# Patient Record
Sex: Female | Born: 1940 | Race: White | Hispanic: No | Marital: Married | Smoking: Former smoker
Health system: Southern US, Community
[De-identification: ages and names within clinical notes are randomized; demographics above are authoritative.]

## PROBLEM LIST (undated history)

## (undated) DIAGNOSIS — K469 Unspecified abdominal hernia without obstruction or gangrene: Secondary | ICD-10-CM

## (undated) DIAGNOSIS — I700 Atherosclerosis of aorta: Secondary | ICD-10-CM

## (undated) DIAGNOSIS — Z961 Presence of intraocular lens: Secondary | ICD-10-CM

## (undated) DIAGNOSIS — Z973 Presence of spectacles and contact lenses: Secondary | ICD-10-CM

## (undated) DIAGNOSIS — K5190 Ulcerative colitis, unspecified, without complications: Secondary | ICD-10-CM

## (undated) DIAGNOSIS — M21619 Bunion of unspecified foot: Secondary | ICD-10-CM

## (undated) DIAGNOSIS — K222 Esophageal obstruction: Secondary | ICD-10-CM

## (undated) DIAGNOSIS — K5730 Diverticulosis of large intestine without perforation or abscess without bleeding: Secondary | ICD-10-CM

## (undated) DIAGNOSIS — Z8709 Personal history of other diseases of the respiratory system: Secondary | ICD-10-CM

## (undated) DIAGNOSIS — E785 Hyperlipidemia, unspecified: Secondary | ICD-10-CM

## (undated) DIAGNOSIS — R21 Rash and other nonspecific skin eruption: Secondary | ICD-10-CM

## (undated) DIAGNOSIS — K644 Residual hemorrhoidal skin tags: Secondary | ICD-10-CM

## (undated) DIAGNOSIS — Z87440 Personal history of urinary (tract) infections: Secondary | ICD-10-CM

## (undated) DIAGNOSIS — Z8619 Personal history of other infectious and parasitic diseases: Secondary | ICD-10-CM

## (undated) DIAGNOSIS — H179 Unspecified corneal scar and opacity: Secondary | ICD-10-CM

## (undated) DIAGNOSIS — R2689 Other abnormalities of gait and mobility: Secondary | ICD-10-CM

## (undated) DIAGNOSIS — Z9989 Dependence on other enabling machines and devices: Secondary | ICD-10-CM

## (undated) DIAGNOSIS — M8580 Other specified disorders of bone density and structure, unspecified site: Secondary | ICD-10-CM

## (undated) DIAGNOSIS — E039 Hypothyroidism, unspecified: Secondary | ICD-10-CM

## (undated) DIAGNOSIS — R6883 Chills (without fever): Secondary | ICD-10-CM

## (undated) DIAGNOSIS — F329 Major depressive disorder, single episode, unspecified: Secondary | ICD-10-CM

## (undated) DIAGNOSIS — J45909 Unspecified asthma, uncomplicated: Secondary | ICD-10-CM

## (undated) DIAGNOSIS — R42 Dizziness and giddiness: Secondary | ICD-10-CM

## (undated) DIAGNOSIS — S82899A Other fracture of unspecified lower leg, initial encounter for closed fracture: Secondary | ICD-10-CM

## (undated) DIAGNOSIS — H01006 Unspecified blepharitis left eye, unspecified eyelid: Secondary | ICD-10-CM

## (undated) DIAGNOSIS — L299 Pruritus, unspecified: Secondary | ICD-10-CM

## (undated) DIAGNOSIS — IMO0002 Reserved for concepts with insufficient information to code with codable children: Secondary | ICD-10-CM

## (undated) DIAGNOSIS — K859 Acute pancreatitis without necrosis or infection, unspecified: Secondary | ICD-10-CM

## (undated) DIAGNOSIS — J189 Pneumonia, unspecified organism: Secondary | ICD-10-CM

## (undated) DIAGNOSIS — K589 Irritable bowel syndrome without diarrhea: Secondary | ICD-10-CM

## (undated) DIAGNOSIS — F419 Anxiety disorder, unspecified: Secondary | ICD-10-CM

## (undated) DIAGNOSIS — Z780 Asymptomatic menopausal state: Secondary | ICD-10-CM

## (undated) DIAGNOSIS — K219 Gastro-esophageal reflux disease without esophagitis: Secondary | ICD-10-CM

## (undated) DIAGNOSIS — H04123 Dry eye syndrome of bilateral lacrimal glands: Secondary | ICD-10-CM

## (undated) DIAGNOSIS — K648 Other hemorrhoids: Secondary | ICD-10-CM

## (undated) DIAGNOSIS — D649 Anemia, unspecified: Secondary | ICD-10-CM

## (undated) DIAGNOSIS — H43819 Vitreous degeneration, unspecified eye: Secondary | ICD-10-CM

## (undated) DIAGNOSIS — I8390 Asymptomatic varicose veins of unspecified lower extremity: Secondary | ICD-10-CM

## (undated) DIAGNOSIS — H31009 Unspecified chorioretinal scars, unspecified eye: Secondary | ICD-10-CM

## (undated) DIAGNOSIS — K620 Anal polyp: Secondary | ICD-10-CM

## (undated) DIAGNOSIS — F32A Depression, unspecified: Secondary | ICD-10-CM

## (undated) DIAGNOSIS — M797 Fibromyalgia: Secondary | ICD-10-CM

## (undated) DIAGNOSIS — G43909 Migraine, unspecified, not intractable, without status migrainosus: Secondary | ICD-10-CM

## (undated) DIAGNOSIS — H269 Unspecified cataract: Secondary | ICD-10-CM

## (undated) DIAGNOSIS — I6381 Other cerebral infarction due to occlusion or stenosis of small artery: Secondary | ICD-10-CM

## (undated) DIAGNOSIS — I480 Paroxysmal atrial fibrillation: Secondary | ICD-10-CM

## (undated) DIAGNOSIS — M67919 Unspecified disorder of synovium and tendon, unspecified shoulder: Secondary | ICD-10-CM

## (undated) DIAGNOSIS — R112 Nausea with vomiting, unspecified: Secondary | ICD-10-CM

## (undated) DIAGNOSIS — K449 Diaphragmatic hernia without obstruction or gangrene: Secondary | ICD-10-CM

## (undated) DIAGNOSIS — B029 Zoster without complications: Secondary | ICD-10-CM

## (undated) DIAGNOSIS — G4733 Obstructive sleep apnea (adult) (pediatric): Secondary | ICD-10-CM

## (undated) DIAGNOSIS — N8110 Cystocele, unspecified: Secondary | ICD-10-CM

## (undated) DIAGNOSIS — Z800 Family history of malignant neoplasm of digestive organs: Secondary | ICD-10-CM

## (undated) DIAGNOSIS — Z9889 Other specified postprocedural states: Secondary | ICD-10-CM

## (undated) DIAGNOSIS — H26499 Other secondary cataract, unspecified eye: Secondary | ICD-10-CM

## (undated) DIAGNOSIS — I639 Cerebral infarction, unspecified: Secondary | ICD-10-CM

## (undated) DIAGNOSIS — T7840XA Allergy, unspecified, initial encounter: Secondary | ICD-10-CM

## (undated) HISTORY — DX: Diverticulosis of large intestine without perforation or abscess without bleeding: K57.30

## (undated) HISTORY — PX: TONSILLECTOMY: SUR1361

## (undated) HISTORY — DX: Bunion of unspecified foot: M21.619

## (undated) HISTORY — PX: FOOT SURGERY: SHX648

## (undated) HISTORY — DX: Presence of intraocular lens: Z96.1

## (undated) HISTORY — DX: Residual hemorrhoidal skin tags: K64.4

## (undated) HISTORY — DX: Zoster without complications: B02.9

## (undated) HISTORY — DX: Esophageal obstruction: K22.2

## (undated) HISTORY — DX: Migraine, unspecified, not intractable, without status migrainosus: G43.909

## (undated) HISTORY — PX: TOTAL HIP ARTHROPLASTY: SHX124

## (undated) HISTORY — DX: Fibromyalgia: M79.7

## (undated) HISTORY — DX: Major depressive disorder, single episode, unspecified: F32.9

## (undated) HISTORY — DX: Acute pancreatitis without necrosis or infection, unspecified: K85.90

## (undated) HISTORY — DX: Allergy, unspecified, initial encounter: T78.40XA

## (undated) HISTORY — DX: Other fracture of unspecified lower leg, initial encounter for closed fracture: S82.899A

## (undated) HISTORY — PX: TOTAL KNEE ARTHROPLASTY: SHX125

## (undated) HISTORY — DX: Unspecified disorder of synovium and tendon, unspecified shoulder: M67.919

## (undated) HISTORY — PX: COLONOSCOPY: SHX174

## (undated) HISTORY — DX: Unspecified corneal scar and opacity: H17.9

## (undated) HISTORY — DX: Other specified disorders of bone density and structure, unspecified site: M85.80

## (undated) HISTORY — DX: Other hemorrhoids: K64.8

## (undated) HISTORY — DX: Unspecified blepharitis left eye, unspecified eyelid: H01.006

## (undated) HISTORY — PX: ABDOMINAL HYSTERECTOMY: SHX81

## (undated) HISTORY — DX: Paroxysmal atrial fibrillation: I48.0

## (undated) HISTORY — PX: HEMORRHOID SURGERY: SHX153

## (undated) HISTORY — DX: Unspecified asthma, uncomplicated: J45.909

## (undated) HISTORY — DX: Anal polyp: K62.0

## (undated) HISTORY — DX: Hyperlipidemia, unspecified: E78.5

## (undated) HISTORY — PX: EYE SURGERY: SHX253

## (undated) HISTORY — DX: Dependence on other enabling machines and devices: Z99.89

## (undated) HISTORY — PX: BREAST EXCISIONAL BIOPSY: SUR124

## (undated) HISTORY — PX: ESOPHAGEAL DILATION: SHX303

## (undated) HISTORY — DX: Anxiety disorder, unspecified: F41.9

## (undated) HISTORY — DX: Obstructive sleep apnea (adult) (pediatric): G47.33

## (undated) HISTORY — DX: Reserved for concepts with insufficient information to code with codable children: IMO0002

## (undated) HISTORY — DX: Other cerebral infarction due to occlusion or stenosis of small artery: I63.81

## (undated) HISTORY — DX: Unspecified abdominal hernia without obstruction or gangrene: K46.9

## (undated) HISTORY — PX: ANAL FISSURE REPAIR: SHX2312

## (undated) HISTORY — DX: Dry eye syndrome of bilateral lacrimal glands: H04.123

## (undated) HISTORY — PX: NASAL SEPTUM SURGERY: SHX37

## (undated) HISTORY — DX: Cerebral infarction, unspecified: I63.9

## (undated) HISTORY — DX: Unspecified cataract: H26.9

## (undated) HISTORY — DX: Gastro-esophageal reflux disease without esophagitis: K21.9

## (undated) HISTORY — DX: Unspecified chorioretinal scars, unspecified eye: H31.009

## (undated) HISTORY — DX: Diaphragmatic hernia without obstruction or gangrene: K44.9

## (undated) HISTORY — DX: Anemia, unspecified: D64.9

## (undated) HISTORY — DX: Atherosclerosis of aorta: I70.0

## (undated) HISTORY — DX: Vitreous degeneration, unspecified eye: H43.819

## (undated) HISTORY — DX: Depression, unspecified: F32.A

## (undated) HISTORY — DX: Family history of malignant neoplasm of digestive organs: Z80.0

## (undated) HISTORY — DX: Irritable bowel syndrome, unspecified: K58.9

## (undated) HISTORY — DX: Cystocele, unspecified: N81.10

## (undated) HISTORY — DX: Other secondary cataract, unspecified eye: H26.499

## (undated) HISTORY — DX: Hypothyroidism, unspecified: E03.9

---
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
## 1989-05-31 HISTORY — PX: TOTAL ABDOMINAL HYSTERECTOMY W/ BILATERAL SALPINGOOPHORECTOMY: SHX83

## 1996-05-31 DIAGNOSIS — K620 Anal polyp: Secondary | ICD-10-CM

## 1996-05-31 HISTORY — DX: Anal polyp: K62.0

## 1998-05-31 DIAGNOSIS — K644 Residual hemorrhoidal skin tags: Secondary | ICD-10-CM

## 1998-05-31 HISTORY — DX: Residual hemorrhoidal skin tags: K64.4

## 1998-07-07 ENCOUNTER — Other Ambulatory Visit: Admission: RE | Admit: 1998-07-07 | Discharge: 1998-07-07 | Payer: Self-pay | Admitting: Obstetrics and Gynecology

## 1999-07-20 ENCOUNTER — Other Ambulatory Visit: Admission: RE | Admit: 1999-07-20 | Discharge: 1999-07-20 | Payer: Self-pay | Admitting: Obstetrics and Gynecology

## 1999-08-10 ENCOUNTER — Encounter: Admission: RE | Admit: 1999-08-10 | Discharge: 1999-08-10 | Payer: Self-pay | Admitting: Obstetrics and Gynecology

## 1999-08-10 ENCOUNTER — Encounter: Payer: Self-pay | Admitting: Obstetrics and Gynecology

## 2000-02-16 ENCOUNTER — Ambulatory Visit (HOSPITAL_COMMUNITY): Admission: RE | Admit: 2000-02-16 | Discharge: 2000-02-16 | Payer: Self-pay | Admitting: Endocrinology

## 2000-07-25 ENCOUNTER — Other Ambulatory Visit: Admission: RE | Admit: 2000-07-25 | Discharge: 2000-07-25 | Payer: Self-pay | Admitting: Obstetrics and Gynecology

## 2000-08-17 ENCOUNTER — Encounter: Admission: RE | Admit: 2000-08-17 | Discharge: 2000-08-17 | Payer: Self-pay | Admitting: General Surgery

## 2000-08-17 ENCOUNTER — Encounter: Payer: Self-pay | Admitting: General Surgery

## 2001-08-21 ENCOUNTER — Encounter: Payer: Self-pay | Admitting: Endocrinology

## 2001-08-21 ENCOUNTER — Encounter: Admission: RE | Admit: 2001-08-21 | Discharge: 2001-08-21 | Payer: Self-pay | Admitting: Endocrinology

## 2001-08-22 ENCOUNTER — Other Ambulatory Visit: Admission: RE | Admit: 2001-08-22 | Discharge: 2001-08-22 | Payer: Self-pay | Admitting: Obstetrics and Gynecology

## 2001-12-12 ENCOUNTER — Encounter: Admission: RE | Admit: 2001-12-12 | Discharge: 2002-03-12 | Payer: Self-pay | Admitting: Endocrinology

## 2002-08-27 ENCOUNTER — Encounter: Payer: Self-pay | Admitting: General Surgery

## 2002-08-27 ENCOUNTER — Encounter: Admission: RE | Admit: 2002-08-27 | Discharge: 2002-08-27 | Payer: Self-pay | Admitting: General Surgery

## 2002-11-05 ENCOUNTER — Other Ambulatory Visit: Admission: RE | Admit: 2002-11-05 | Discharge: 2002-11-05 | Payer: Self-pay | Admitting: Obstetrics and Gynecology

## 2003-07-29 ENCOUNTER — Encounter: Payer: Self-pay | Admitting: Gastroenterology

## 2003-08-12 ENCOUNTER — Ambulatory Visit (HOSPITAL_COMMUNITY): Admission: RE | Admit: 2003-08-12 | Discharge: 2003-08-12 | Payer: Self-pay | Admitting: Orthopedic Surgery

## 2003-08-12 ENCOUNTER — Ambulatory Visit (HOSPITAL_BASED_OUTPATIENT_CLINIC_OR_DEPARTMENT_OTHER): Admission: RE | Admit: 2003-08-12 | Discharge: 2003-08-12 | Payer: Self-pay | Admitting: Orthopedic Surgery

## 2003-09-03 ENCOUNTER — Encounter: Admission: RE | Admit: 2003-09-03 | Discharge: 2003-09-03 | Payer: Self-pay | Admitting: Endocrinology

## 2004-01-14 ENCOUNTER — Other Ambulatory Visit: Admission: RE | Admit: 2004-01-14 | Discharge: 2004-01-14 | Payer: Self-pay | Admitting: Obstetrics and Gynecology

## 2004-08-11 ENCOUNTER — Ambulatory Visit: Payer: Self-pay | Admitting: Gastroenterology

## 2004-09-10 ENCOUNTER — Ambulatory Visit: Payer: Self-pay | Admitting: Gastroenterology

## 2004-09-16 ENCOUNTER — Encounter: Admission: RE | Admit: 2004-09-16 | Discharge: 2004-09-16 | Payer: Self-pay | Admitting: Obstetrics and Gynecology

## 2005-01-21 ENCOUNTER — Other Ambulatory Visit: Admission: RE | Admit: 2005-01-21 | Discharge: 2005-01-21 | Payer: Self-pay | Admitting: Obstetrics and Gynecology

## 2005-09-21 ENCOUNTER — Encounter: Admission: RE | Admit: 2005-09-21 | Discharge: 2005-09-21 | Payer: Self-pay | Admitting: General Surgery

## 2005-09-22 ENCOUNTER — Encounter: Admission: RE | Admit: 2005-09-22 | Discharge: 2005-09-22 | Payer: Self-pay | Admitting: General Surgery

## 2005-10-21 ENCOUNTER — Ambulatory Visit: Payer: Self-pay | Admitting: Gastroenterology

## 2005-11-23 ENCOUNTER — Ambulatory Visit: Payer: Self-pay | Admitting: Gastroenterology

## 2006-03-28 ENCOUNTER — Other Ambulatory Visit: Admission: RE | Admit: 2006-03-28 | Discharge: 2006-03-28 | Payer: Self-pay | Admitting: Obstetrics and Gynecology

## 2006-10-26 ENCOUNTER — Encounter: Admission: RE | Admit: 2006-10-26 | Discharge: 2006-10-26 | Payer: Self-pay | Admitting: General Surgery

## 2006-10-31 ENCOUNTER — Encounter: Admission: RE | Admit: 2006-10-31 | Discharge: 2006-10-31 | Payer: Self-pay | Admitting: General Surgery

## 2006-12-11 ENCOUNTER — Emergency Department (HOSPITAL_COMMUNITY): Admission: EM | Admit: 2006-12-11 | Discharge: 2006-12-11 | Payer: Self-pay | Admitting: Emergency Medicine

## 2006-12-14 ENCOUNTER — Encounter: Admission: RE | Admit: 2006-12-14 | Discharge: 2006-12-14 | Payer: Self-pay | Admitting: Geriatric Medicine

## 2007-02-14 ENCOUNTER — Encounter: Admission: RE | Admit: 2007-02-14 | Discharge: 2007-02-14 | Payer: Self-pay | Admitting: Neurology

## 2007-05-16 ENCOUNTER — Other Ambulatory Visit: Admission: RE | Admit: 2007-05-16 | Discharge: 2007-05-16 | Payer: Self-pay | Admitting: Obstetrics and Gynecology

## 2007-11-07 ENCOUNTER — Encounter: Admission: RE | Admit: 2007-11-07 | Discharge: 2007-11-07 | Payer: Self-pay | Admitting: Endocrinology

## 2008-02-13 ENCOUNTER — Ambulatory Visit: Payer: Self-pay | Admitting: Obstetrics and Gynecology

## 2008-02-25 ENCOUNTER — Emergency Department (HOSPITAL_COMMUNITY): Admission: EM | Admit: 2008-02-25 | Discharge: 2008-02-25 | Payer: Self-pay | Admitting: Family Medicine

## 2008-03-11 ENCOUNTER — Encounter: Admission: RE | Admit: 2008-03-11 | Discharge: 2008-03-11 | Payer: Self-pay | Admitting: Geriatric Medicine

## 2008-04-30 ENCOUNTER — Ambulatory Visit: Payer: Self-pay | Admitting: Obstetrics and Gynecology

## 2008-05-31 DIAGNOSIS — K5730 Diverticulosis of large intestine without perforation or abscess without bleeding: Secondary | ICD-10-CM

## 2008-05-31 HISTORY — DX: Diverticulosis of large intestine without perforation or abscess without bleeding: K57.30

## 2008-06-21 ENCOUNTER — Ambulatory Visit: Payer: Self-pay | Admitting: Obstetrics and Gynecology

## 2008-07-10 DIAGNOSIS — IMO0001 Reserved for inherently not codable concepts without codable children: Secondary | ICD-10-CM | POA: Insufficient documentation

## 2008-07-10 DIAGNOSIS — K5730 Diverticulosis of large intestine without perforation or abscess without bleeding: Secondary | ICD-10-CM | POA: Insufficient documentation

## 2008-07-10 DIAGNOSIS — F411 Generalized anxiety disorder: Secondary | ICD-10-CM | POA: Insufficient documentation

## 2008-07-10 DIAGNOSIS — K222 Esophageal obstruction: Secondary | ICD-10-CM | POA: Insufficient documentation

## 2008-07-10 DIAGNOSIS — K449 Diaphragmatic hernia without obstruction or gangrene: Secondary | ICD-10-CM | POA: Insufficient documentation

## 2008-07-10 DIAGNOSIS — M1990 Unspecified osteoarthritis, unspecified site: Secondary | ICD-10-CM | POA: Insufficient documentation

## 2008-07-10 DIAGNOSIS — K589 Irritable bowel syndrome without diarrhea: Secondary | ICD-10-CM | POA: Insufficient documentation

## 2008-07-10 DIAGNOSIS — F3289 Other specified depressive episodes: Secondary | ICD-10-CM | POA: Insufficient documentation

## 2008-07-10 DIAGNOSIS — K219 Gastro-esophageal reflux disease without esophagitis: Secondary | ICD-10-CM | POA: Insufficient documentation

## 2008-07-10 DIAGNOSIS — F329 Major depressive disorder, single episode, unspecified: Secondary | ICD-10-CM | POA: Insufficient documentation

## 2008-07-10 DIAGNOSIS — K648 Other hemorrhoids: Secondary | ICD-10-CM | POA: Insufficient documentation

## 2008-07-10 DIAGNOSIS — E039 Hypothyroidism, unspecified: Secondary | ICD-10-CM | POA: Insufficient documentation

## 2008-07-12 ENCOUNTER — Ambulatory Visit: Payer: Self-pay | Admitting: Gastroenterology

## 2008-07-12 DIAGNOSIS — R079 Chest pain, unspecified: Secondary | ICD-10-CM | POA: Insufficient documentation

## 2008-07-12 DIAGNOSIS — K5900 Constipation, unspecified: Secondary | ICD-10-CM | POA: Insufficient documentation

## 2008-07-12 DIAGNOSIS — R1319 Other dysphagia: Secondary | ICD-10-CM | POA: Insufficient documentation

## 2008-07-12 LAB — CONVERTED CEMR LAB
ALT: 19 units/L (ref 0–35)
AST: 23 units/L (ref 0–37)
Albumin: 4 g/dL (ref 3.5–5.2)
Alkaline Phosphatase: 52 units/L (ref 39–117)
BUN: 11 mg/dL (ref 6–23)
Basophils Absolute: 0 10*3/uL (ref 0.0–0.1)
Basophils Relative: 0 % (ref 0.0–3.0)
Bilirubin, Direct: 0.1 mg/dL (ref 0.0–0.3)
CO2: 29 meq/L (ref 19–32)
Calcium: 9.5 mg/dL (ref 8.4–10.5)
Chloride: 105 meq/L (ref 96–112)
Creatinine, Ser: 0.7 mg/dL (ref 0.4–1.2)
Eosinophils Absolute: 0.2 10*3/uL (ref 0.0–0.7)
Eosinophils Relative: 3 % (ref 0.0–5.0)
Ferritin: 26.2 ng/mL (ref 10.0–291.0)
Folate: >20 ng/mL
GFR calc Af Amer: 107 mL/min
GFR calc non Af Amer: 89 mL/min
Glucose, Bld: 100 mg/dL — ABNORMAL HIGH (ref 70–99)
HCT: 41.2 % (ref 36.0–46.0)
Hemoglobin: 14.2 g/dL (ref 12.0–15.0)
Iron: 77 ug/dL (ref 42–145)
Lymphocytes Relative: 38.8 % (ref 12.0–46.0)
MCHC: 34.6 g/dL (ref 30.0–36.0)
MCV: 90.7 fL (ref 78.0–100.0)
Monocytes Absolute: 0.5 10*3/uL (ref 0.1–1.0)
Monocytes Relative: 9.1 % (ref 3.0–12.0)
Neutro Abs: 2.7 10*3/uL (ref 1.4–7.7)
Neutrophils Relative %: 49.1 % (ref 43.0–77.0)
Platelets: 201 10*3/uL (ref 150–400)
Potassium: 4.1 meq/L (ref 3.5–5.1)
RBC: 4.54 M/uL (ref 3.87–5.11)
RDW: 11.8 % (ref 11.5–14.6)
Saturation Ratios: 20 % (ref 20.0–50.0)
Sodium: 140 meq/L (ref 135–145)
T3 Uptake Ratio: 35.1 % (ref 22.5–37.0)
T3, Free: 3.1 pg/mL (ref 2.3–4.2)
TSH: 0.79 microintl units/mL (ref 0.35–5.50)
Total Bilirubin: 0.6 mg/dL (ref 0.3–1.2)
Total Protein: 7 g/dL (ref 6.0–8.3)
Transferrin: 274.8 mg/dL (ref >=212.0)
Vitamin B-12: 395 pg/mL (ref 211–911)
WBC: 5.5 10*3/uL (ref 4.5–10.5)

## 2008-07-15 ENCOUNTER — Telehealth: Payer: Self-pay | Admitting: Gastroenterology

## 2008-07-16 ENCOUNTER — Ambulatory Visit (HOSPITAL_COMMUNITY): Admission: RE | Admit: 2008-07-16 | Discharge: 2008-07-16 | Payer: Self-pay | Admitting: Gastroenterology

## 2008-07-16 DIAGNOSIS — I6350 Cerebral infarction due to unspecified occlusion or stenosis of unspecified cerebral artery: Secondary | ICD-10-CM | POA: Insufficient documentation

## 2008-07-17 ENCOUNTER — Telehealth: Payer: Self-pay | Admitting: Gastroenterology

## 2008-07-24 ENCOUNTER — Telehealth: Payer: Self-pay | Admitting: Gastroenterology

## 2008-08-12 ENCOUNTER — Ambulatory Visit: Payer: Self-pay | Admitting: Obstetrics and Gynecology

## 2008-08-19 ENCOUNTER — Telehealth: Payer: Self-pay | Admitting: Gastroenterology

## 2008-08-26 ENCOUNTER — Telehealth: Payer: Self-pay | Admitting: Gastroenterology

## 2008-09-12 ENCOUNTER — Telehealth: Payer: Self-pay | Admitting: Gastroenterology

## 2008-09-18 ENCOUNTER — Ambulatory Visit: Payer: Self-pay | Admitting: Gastroenterology

## 2008-09-18 ENCOUNTER — Encounter: Payer: Self-pay | Admitting: Gastroenterology

## 2008-09-22 ENCOUNTER — Encounter: Payer: Self-pay | Admitting: Gastroenterology

## 2008-09-23 ENCOUNTER — Telehealth: Payer: Self-pay | Admitting: Gastroenterology

## 2008-11-05 ENCOUNTER — Telehealth: Payer: Self-pay | Admitting: Gastroenterology

## 2008-11-14 ENCOUNTER — Encounter: Admission: RE | Admit: 2008-11-14 | Discharge: 2008-11-14 | Payer: Self-pay | Admitting: Geriatric Medicine

## 2008-12-18 ENCOUNTER — Ambulatory Visit: Payer: Self-pay | Admitting: Obstetrics and Gynecology

## 2009-01-06 ENCOUNTER — Telehealth: Payer: Self-pay | Admitting: Gastroenterology

## 2009-01-13 ENCOUNTER — Ambulatory Visit: Payer: Self-pay | Admitting: Obstetrics and Gynecology

## 2009-01-29 ENCOUNTER — Ambulatory Visit: Payer: Self-pay | Admitting: Obstetrics and Gynecology

## 2009-02-05 ENCOUNTER — Telehealth: Payer: Self-pay | Admitting: Gastroenterology

## 2009-02-10 ENCOUNTER — Encounter: Payer: Self-pay | Admitting: Gastroenterology

## 2009-02-24 ENCOUNTER — Ambulatory Visit: Payer: Self-pay | Admitting: Women's Health

## 2009-03-11 ENCOUNTER — Ambulatory Visit: Payer: Self-pay | Admitting: Obstetrics and Gynecology

## 2009-03-20 ENCOUNTER — Telehealth: Payer: Self-pay | Admitting: Gastroenterology

## 2009-04-15 ENCOUNTER — Ambulatory Visit: Payer: Self-pay | Admitting: Women's Health

## 2009-05-31 HISTORY — PX: REPLACEMENT TOTAL KNEE: SUR1224

## 2009-07-02 ENCOUNTER — Other Ambulatory Visit: Admission: RE | Admit: 2009-07-02 | Discharge: 2009-07-02 | Payer: Self-pay | Admitting: Obstetrics and Gynecology

## 2009-07-02 ENCOUNTER — Ambulatory Visit: Payer: Self-pay | Admitting: Obstetrics and Gynecology

## 2009-07-15 ENCOUNTER — Encounter: Admission: RE | Admit: 2009-07-15 | Discharge: 2009-07-15 | Payer: Self-pay | Admitting: Neurological Surgery

## 2009-08-07 ENCOUNTER — Ambulatory Visit: Payer: Self-pay | Admitting: Women's Health

## 2009-08-26 ENCOUNTER — Ambulatory Visit: Payer: Self-pay | Admitting: Obstetrics and Gynecology

## 2009-09-08 ENCOUNTER — Inpatient Hospital Stay (HOSPITAL_COMMUNITY): Admission: RE | Admit: 2009-09-08 | Discharge: 2009-09-10 | Payer: Self-pay | Admitting: Orthopedic Surgery

## 2009-09-15 ENCOUNTER — Telehealth: Payer: Self-pay | Admitting: Gastroenterology

## 2009-09-16 ENCOUNTER — Ambulatory Visit: Payer: Self-pay | Admitting: Internal Medicine

## 2009-09-16 DIAGNOSIS — Z860100 Personal history of colon polyps, unspecified: Secondary | ICD-10-CM | POA: Insufficient documentation

## 2009-09-16 DIAGNOSIS — R197 Diarrhea, unspecified: Secondary | ICD-10-CM | POA: Insufficient documentation

## 2009-09-17 ENCOUNTER — Encounter: Payer: Self-pay | Admitting: Nurse Practitioner

## 2009-09-17 ENCOUNTER — Telehealth: Payer: Self-pay | Admitting: Nurse Practitioner

## 2009-09-18 ENCOUNTER — Telehealth: Payer: Self-pay | Admitting: Gastroenterology

## 2009-09-23 ENCOUNTER — Ambulatory Visit: Payer: Self-pay | Admitting: Nurse Practitioner

## 2009-09-24 LAB — CONVERTED CEMR LAB
Basophils Absolute: 0.1 10*3/uL (ref 0.0–0.1)
Basophils Relative: 0.5 % (ref 0.0–3.0)
Eosinophils Absolute: 0.1 10*3/uL (ref 0.0–0.7)
Eosinophils Relative: 1 % (ref 0.0–5.0)
HCT: 31.8 % — ABNORMAL LOW (ref 36.0–46.0)
Hemoglobin: 10.9 g/dL — ABNORMAL LOW (ref 12.0–15.0)
Lymphocytes Relative: 21.4 % (ref 12.0–46.0)
Lymphs Abs: 2.2 10*3/uL (ref 0.7–4.0)
MCHC: 34.3 g/dL (ref 30.0–36.0)
MCV: 92.7 fL (ref 78.0–100.0)
Monocytes Absolute: 0.6 10*3/uL (ref 0.1–1.0)
Monocytes Relative: 6.3 % (ref 3.0–12.0)
Neutro Abs: 7.1 10*3/uL (ref 1.4–7.7)
Neutrophils Relative %: 70.8 % (ref 43.0–77.0)
Platelets: 477 10*3/uL — ABNORMAL HIGH (ref 150.0–400.0)
RBC: 3.43 M/uL — ABNORMAL LOW (ref 3.87–5.11)
RDW: 12.9 % (ref 11.5–14.6)
WBC: 10.1 10*3/uL (ref 4.5–10.5)

## 2009-09-25 ENCOUNTER — Telehealth: Payer: Self-pay | Admitting: Gastroenterology

## 2009-10-06 ENCOUNTER — Telehealth: Payer: Self-pay | Admitting: Gastroenterology

## 2009-10-08 ENCOUNTER — Ambulatory Visit: Payer: Self-pay | Admitting: Women's Health

## 2009-11-25 ENCOUNTER — Encounter: Admission: RE | Admit: 2009-11-25 | Discharge: 2009-11-25 | Payer: Self-pay | Admitting: Geriatric Medicine

## 2009-12-31 ENCOUNTER — Ambulatory Visit: Payer: Self-pay | Admitting: Women's Health

## 2010-03-05 ENCOUNTER — Encounter: Payer: Self-pay | Admitting: Gastroenterology

## 2010-05-01 ENCOUNTER — Ambulatory Visit: Payer: Self-pay | Admitting: Women's Health

## 2010-06-21 ENCOUNTER — Encounter: Payer: Self-pay | Admitting: General Surgery

## 2010-06-21 ENCOUNTER — Encounter: Payer: Self-pay | Admitting: Orthopedic Surgery

## 2010-06-30 NOTE — Progress Notes (Signed)
Summary: Align  Phone Note Call from Patient Call back at [facility] Phone [phone]   Caller: Patient Call For: Dr. Jarold Motto Reason for Call: Talk to Nurse Summary of Call: 1. would like to know if she should use something else besides Align 2. would like labwork sent to Dr. Merlene Laughter and Dr. Corrin Parker Initial call taken by: Vallarie Mare,  September 25, 2009 11:55 AM  Follow-up for Phone Call        Talked with pt.  She will cont align since she is on  another antiobiotic.  She will report any diarrhea that develops. labs sent to MD as requested. Follow-up by: Ashok Cordia RN,  September 25, 2009 12:35 PM

## 2010-06-30 NOTE — Procedures (Signed)
Summary: EGD   EGD  Procedure date:  09/18/2008  Findings:      Location: [facility]    ENDOSCOPY PROCEDURE REPORT  PATIENT:  Kimberly [name], Kimberly [name]  MR#:  [id number] BIRTHDATE:   [birth year], 67 yrs. old   GENDER:   female  ENDOSCOPIST:   Vania Rea. Jarold Motto, MD, [facility] Referred by:   PROCEDURE DATE:  09/18/2008 PROCEDURE:  EGD with biopsy, Elease Hashimoto Dilation of Esophagus ASA CLASS:   Class II INDICATIONS: GERD, dysphagia   MEDICATIONS:    Fentanyl 25 mcg IV, Versed 2 mg IV robinul 0.2mg  TOPICAL ANESTHETIC:   none  DESCRIPTION OF PROCEDURE:   After the risks benefits and alternatives of the procedure were thoroughly explained, informed consent was obtained.  The LB GIF-H180 [id number] endoscope was introduced through the mouth and advanced to the second portion of the duodenum, limited by retching and gagging.   The instrument was slowly withdrawn as the mucosa was fully examined. <<PROCEDUREIMAGES>>    <<OLD IMAGES>>  Multiple erosions were found in the distal esophagus.  A hiatal hernia was found in the fundus. small 2 cm HH noted.  A stricture was found in the distal esophagus. Dilation with maloney dilator 17mm  The duodenal bulb was normal in appearance, as was the postbulbar duodenum. Multiple biopsies were obtained and sent to pathology.    Retroflexed views revealed a hiatal hernia.    The scope was then withdrawn from the patient and the procedure completed.  COMPLICATIONS:   None  ENDOSCOPIC IMPRESSION:  1) Erosions, multiple in the distal esophagus  2) Hiatal hernia in the fundus  3) Stricture in the distal esophagus  4) Normal duodenum  5) A hiatal hernia  CHRONIC GERD AND STRICTURE DILATED. RECOMMENDATIONS:  1) await biopsy results  2) continue current medications  REPEAT EXAM:   No   _______________________________ Vania Rea. Jarold Motto, MD, [facility]    CC: Merlene Laughter, MD        REPORT OF SURGICAL PATHOLOGY   Case #: [id number] Patient  Name: TAMELLA, TUCCILLO. Office Chart Number:  [id number]   MRN: [id number] Pathologist: Beulah Gandy. Luisa Hart, MD DOB/Age  06/06/1940 (Age: 12)    Gender: F Date Taken:  09/18/2008 Date Received: 09/18/2008   FINAL DIAGNOSIS   ***MICROSCOPIC EXAMINATION AND DIAGNOSIS***   SMALL BOWEL BIOPSY:  BENIGN SMALL BOWEL MUCOSA.  NO VILLOUS ATROPHY, INFLAMMATION OR OTHER ABNORMALITIES PRESENT.   COMMENT There is small bowel mucosa with normal villous architecture and no objective increase in inflammation.  No villous atrophy, active inflammation or other significant changes identified. (JDP:jy) 09/19/08   jy Date Reported:  09/19/2008     Beulah Gandy. Luisa Hart, MD *** Electronically Signed Out By JDP ***       September 22, 2008 MRN: [id number]    FIDELIS LOTH [address] [place], [place]  [address]    Dear Ms. Dimock,  I am pleased to inform you that the biopsies taken during your recent endoscopic examination did not show any evidence of cancer upon pathologic examination.Biopsies showed no evidence of celiac disease.  Additional information/recommendations:  __No further action is needed at this time.  Please follow-up with      your primary care physician for your other healthcare needs.  __ Please call [phone] to schedule a return visit to review      your condition.  xx__ Continue with the treatment plan as outlined on the day of your      exam.  __ You should  have a repeat endoscopic examination for this problem              in _ months/years.   Please call us if you are having persistent problems or have questions about your condition that have not been fully answered at this time.  Sincerely,  Mardella Layman MD [facility]  This letter has been electronically signed by your physician.   This report was created from the original endoscopy report, which was reviewed and signed by the above listed endoscopist.

## 2010-06-30 NOTE — Progress Notes (Signed)
Summary: Lab results  Phone Note Call from Patient Call back at Home Phone [phone]   Caller: Patient Call For: Dr. Jarold Motto Reason for Call: Lab or Test Results Summary of Call: pt is calling about lab results Initial call taken by: Karna Christmas,  September 18, 2009 9:38 AM  Follow-up for Phone Call        patient aware that c-diff result are neg. She is still worried about her blood count Per previous phone note patient can come for CBC to check her HGB/HCT she states that she can come for this test on Monday. I will make sure that the lab order is in [facility] and she will go to the basement.  Follow-up by: Harlow Mares CMA Duncan Dull),  September 18, 2009 10:41 AM    New/Updated Medications: * LOVENOX one injection two times a day

## 2010-06-30 NOTE — Progress Notes (Signed)
Summary: ? meds  Phone Note Call from Patient Call back at Home Phone [phone] Call back at [phone]   Caller: Patient Call For: Kimberly [name]  Reason for Call: Talk to Nurse Details for Reason: ? meds  Summary of Call: has ? re: meds  Initial call taken by: Guadlupe Spanish [facility],  July 15, 2008 11:51 AM  Follow-up for Phone Call        to nurse???? Follow-up by: Mardella Layman MD [facility],  July 15, 2008 12:11 PM  Additional Follow-up for Phone Call Additional follow up Details #1::        had alot of questions about her meds and wanted Dr. Jarold Motto know about her stroke she had. also wanted to know if she can take Librax as needed becuase she already has dry mouth. advised ok.  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

## 2010-06-30 NOTE — Progress Notes (Signed)
Summary: Ecl results  Phone Note Call from Patient Call back at Home Phone [phone] Call back at [phone]   Caller: Patient Call For: Makail Watling Reason for Call: Talk to Nurse Summary of Call: Patient wants ECL results form last week, I advised her that it was mailed to her today but she still wants to speak to nurse about it Initial call taken by: Tawni Levy,  September 23, 2008 4:48 PM  Follow-up for Phone Call        Left message for patient to call back Darcey Nora RN  September 24, 2008 9:02 AM  patient notifed of normal small bowel bx, all questions answered Follow-up by: Darcey Nora RN,  September 24, 2008 9:03 AM

## 2010-06-30 NOTE — Assessment & Plan Note (Signed)
Summary: Diarrhea, dfs   History of Present Illness Visit Type: Follow-up Visit Primary GI MD: Sheryn Bison MD FACP FAGA Primary Provider: Merlene Laughter, MD Chief Complaint: diarrhea History of Present Illness:   Followed by Dr. Jarold Motto for IBS, colon polyps and GERD. Has chronic constipation, uses Miralax or stool softeners as needed. Knee replacement on 09/08/09. Day after surgery received concoction of Miralax, senekot, colace and MOM. She has had bowel problems since then. Describes postprandial belching, gas, and loose stool associated with mild cramping. Also having some nocturnal diarrhea. Over last two days the diarrhea has slowed. She had one small, loose stool this am. No rectal bleeding. No fevers. Has lost several pounds recently.    GI Review of Systems    Reports abdominal pain and  belching.     Location of  Abdominal pain: mild lower cramps. Weight loss of 5-10 poounds pounds over last several days.   Denies acid reflux, bloating, chest pain, dysphagia with liquids, dysphagia with solids, heartburn, loss of appetite, nausea, vomiting, vomiting blood, weight loss, and  weight gain.      Reports diarrhea.     Denies anal fissure, black tarry stools, change in bowel habit, constipation, diverticulosis, fecal incontinence, heme positive stool, hemorrhoids, irritable bowel syndrome, jaundice, light color stool, liver problems, rectal bleeding, and  rectal pain.    Current Medications (verified): 1)  Fluticasone Propionate 50 Mcg/act Susp (Fluticasone Propionate) .... Take 2 Sprays in Each Nostril 2)  Claritin 10 Mg Tabs (Loratadine) .... Take 1 Tablet By Mouth As Needed 3)  Singulair 10 Mg Tabs (Montelukast Sodium) .... Take 1 Tablet By Mouth Once A Day As Needed 4)  Proair Hfa 108 (90 Base) Mcg/act Aers (Albuterol Sulfate) .... Take As Needed 5)  Alvesco 160 Mcg/act Aers (Ciclesonide) .... Take 2 Puffs Every 12 Hours 6)  Omeprazole 20 Mg Tbec (Omeprazole) .... Take One By  Mouth Once Daily 7)  Simply Saline 0.9 % Aers (Saline) .... Use Every Am and Pm 8)  Caltrate 600+d 600-400 Mg-Unit Tabs (Calcium Carbonate-Vitamin D) .... Take 2-3 Tablets By Mouth Once Daily 9)  Chewable Vitamin C 500 Mg Chew (Ascorbic Acid) .... Take 2 Tablets By Mouth Once Daily 10)  Vitamin E Complete  Caps (Vitamin Mixture) .... Take 1 Tablet By Mouth Once A Day 11)  Umecta 40 % Susp (Urea) .... Apply As Needed 12)  Synthroid 125 Mcg Tabs (Levothyroxine Sodium) .... Take 1 Tablet By Mouth Once A Day 13)  Aspirin 81 Mg Tbec (Aspirin) .... Take 1 Tablet By Mouth Once A Day 14)  Clidinium-Chlordiazepoxide 2.5-5 Mg Caps (Clidinium-Chlordiazepoxide) .... Take One By Mouth Three Times A Day 15)  Tylenol Extra Strength 500 Mg Tabs (Acetaminophen) .... As Needed  Allergies (verified): 1)  ! 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 Hysterectomy lumpectomy-left breast Right knee arthroscopy Left knee arthroscopy Left eye surgery Tonsillectomy Deviated septum repair  Family History: Family History of Kidney Disease: Sister ? Family History of Irritable Bowel Syndrome: Sister x 2, Mother Family History of Heart Disease: Father Family History of Diabetes: Sister  Social History: Reviewed history from 07/12/2008 and no changes required. Occupation: Retired/part time antique shop  Married  Patient is a former smoker. -stopped age 30 Alcohol Use - no Daily Caffeine Use-2 cups daily Illicit Drug Use  - no Patient does not get regular exercise.   Review of Systems  The patient denies allergy/sinus, anemia, anxiety-new, arthritis/joint pain, back pain, blood in urine, breast changes/lumps, change in vision, confusion, cough, coughing up blood, depression-new, fainting, fatigue, fever, headaches-new, hearing problems, heart murmur, heart rhythm changes, itching, menstrual pain, muscle pains/cramps, night sweats, nosebleeds, pregnancy symptoms, shortness of breath, skin rash, sleeping problems, sore throat, swelling of feet/legs, swollen lymph glands, thirst - excessive , urination - excessive , urination changes/pain, urine leakage, vision changes, and voice change.    Vital Signs:  Patient profile:   70 year old female Height:      67.5 inches Weight:      190 pounds BMI:     29.42 Pulse rate:   72 / minute Pulse rhythm:   regular BP sitting:   110 / 70  (right arm)  Vitals Entered By: Chales Abrahams CMA Duncan Dull) (September 16, 2009 9:56 AM)  Physical Exam  General:  Well developed, well nourished, no acute distress. Mouth:  No oral lesions. Tongue moist.  Lungs:  Clear throughout to auscultation. Heart:  Regular rate and rhythm. Abdomen:  Abdomen soft, nontender, nondistended. No obvious masses or hepatomegaly.Normal bowel sounds.  Msk:  Abdomen soft, nontender, nondistended. No obvious masses or hepatomegaly.Normal bowel sounds.  Extremities:  No palmar erythema, no edema.  Neurologic:  Alert and  oriented x4;  grossly normal neurologically. Cervical Nodes:  No significant cervical adenopathy. Psych:  Alert and cooperative. Normal mood and affect.   Impression & Recommendations:  Problem # 1:  DIARRHEA (ICD-787.91) Assessment New Normally constipated. Recent knee replacement requiring antibiotics. Diarrhea slightly better over last two days. Still need to exclude C-Difficile colitis. She looks fine, abdominal exam benign. Will await stool studies and she if she continues to  improve. Trial of Align.  Orders: T-Culture, C-Diff Toxin A/B ([id number])  Problem # 2:  PERSONAL HX COLONIC POLYPS (ICD-V12.72) Assessment: Comment Only Up to date on surveillance exam - done April 2010.  Patient Instructions: 1)  Take Align probiotic, 1 capsule daily for 14 days. Samples given. 2)  Please go to our lab.  3)  We have given you a Low Fiber Low Residue diet brochure. 4)  The medication list was reviewed and reconciled.  All changed / newly prescribed medications were explained.  A complete medication list was provided to the patient / caregiver.    Appended Document: Diarrhea, dfs Patient is on Lovenox. Will make sure that is added to current list of medications.   Appended Document: Diarrhea, dfs added to med list patient does not know dose she will call back with it.

## 2010-06-30 NOTE — Progress Notes (Signed)
Summary: TRIAGE  Phone Note Call from Patient Call back at Home Phone [phone]   Caller: Patient Call For: Sheryn Bison Reason for Call: Talk to Nurse Details for Reason: Triage Summary of Call: RE IBS Questions. Initial call taken by: Darryl Lent CMA Duncan Dull),  March 20, 2009 9:55 AM  Follow-up for Phone Call        Pt. has ongoing vaginal yeast problems for several monthes and some UTI's, Dr.Gottsegan is treating these issues. Pt. also has GERD and IBS, she takes Omeprazole for GERD.  Pt. is concerned the yeast may have gotten into her rectum/Colon. She declines an OV with Dr.Tannar Broker, at this time. I advised pt. to f/u with Dr.Gottsegan and if she needs a GI appt. to callback and we will get her seen.  Follow-up by: Laureen Ochs LPN,  March 20, 2009 11:23 AM

## 2010-06-30 NOTE — Progress Notes (Signed)
Summary: ? re procedure  Phone Note Call from Patient Call back at Home Phone [phone]   Caller: Patient Call For: Jarold Motto Reason for Call: Talk to Nurse Summary of Call: patient has questosn regarding ecl next week Initial call taken by: Tawni Levy,  September 12, 2008 10:31 AM  Follow-up for Phone Call        pateint would like to go [facility] if anything should happen after the procedure or during becuase she has cardiac issues.  Follow-up by: Harlow Mares CMA,  September 12, 2008 10:41 AM

## 2010-06-30 NOTE — Progress Notes (Signed)
Summary: Samples of Align  Phone Note Call from Patient Call back at Home Phone [phone] Call back at or cell [phone]   Call For: Dr Jarold Motto Reason for Call: Talk to Nurse Summary of Call: Needs samples of Align-would like to try it before buying it. Also wonders what she can take for Genella Rife is all out of Aciphex. Initial call taken by: Leanor Kail [facility],  August 26, 2008 9:19 AM  Follow-up for Phone Call        would like a reflux diet and samples of align, both out front for pick up. using Prilosec OTC abd still burping, would like samples of aciphex, out front for her to pick up. but RX sent also.  Follow-up by: Harlow Mares CMA,  August 26, 2008 9:31 AM    New/Updated Medications: ACIPHEX 20 MG TBEC (RABEPRAZOLE SODIUM) Take 1 tablet by mouth once a day ALIGN   CAPS (MISC INTESTINAL FLORA REGULAT) Take one capsule by mouth daily   Prescriptions: ACIPHEX 20 MG TBEC (RABEPRAZOLE SODIUM) Take 1 tablet by mouth once a day  #30 x 3   Entered by:   Harlow Mares CMA   Authorized by:   Mardella Layman MD FACG,FAGA   Signed by:   Harlow Mares CMA on 08/26/2008   Method used:   Electronically to        [facility]  [address] [phone]* (retail)       [address] [place], [place]  [address]       Ph: [phone] or [phone]       Fax: [phone]   RxID:   [id number]

## 2010-06-30 NOTE — Progress Notes (Signed)
Summary: labwork  Phone Note Call from Patient Call back at [phone]   Caller: Patient Call For: Dr. Jarold Motto Reason for Call: Talk to Nurse Summary of Call: would like to discuss labwork results in relation to pt still having symptoms; chills Initial call taken by: Vallarie Mare,  Oct 06, 2009 10:48 AM  Follow-up for Phone Call        LM for pt to call.  Lupita Leash Surface RN  Oct 06, 2009 10:58 AM  Pt would like to get CBC checked again.  Was slightly anemic when last checked and would like to make sure it is coming up.   Also c/o chills and wanted to check WBC.  It would be more convenient for pt to get labs drawn here.  Still taking physical therapy for knee replacement.   OK to order CBC for pt? Follow-up by: Ashok Cordia RN,  Oct 06, 2009 11:45 AM  Additional Follow-up for Phone Call Additional follow up Details #1::        F/U WITH pAULA... Additional Follow-up by: Mardella Layman MD [facility],  Oct 06, 2009 11:54 AM    Additional Follow-up for Phone Call Additional follow up Details #2::    Ok to oder CBC for pt?  see earlier messgae. Follow-up by: Ashok Cordia RN,  Oct 06, 2009 12:11 PM  Additional Follow-up for Phone Call Additional follow up Details #3:: Details for Additional Follow-up Action Taken: Dr. Lavella Hammock   i care to followup...  Pt notified.   Ashok Cordia RN  Oct 07, 2009 2:40 PM Additional Follow-up by: Mardella Layman MD [facility],  Oct 07, 2009 9:55 AM

## 2010-06-30 NOTE — Progress Notes (Signed)
Summary: omeprazole needs prior auth  Phone Note Call from Patient Call back at cell [phone]   Call For: Dr Jarold Motto Reason for Call: Talk to Nurse Summary of Call: Omeprazole requires prior auth now. Was told it would take 2wks or so-what can she take in the meantime? Initial call taken by: Leanor Kail [facility],  February 05, 2009 12:11 PM  Follow-up for Phone Call        I will work on prior auth today an tomorrow and let patient tomorrow AM. patient then has alot of questions I will have Lupita Leash call her back for.  Left message for pt to call  Ashok Cordia RN  February 07, 2009 4:18 PM Follow-up by: Harlow Mares CMA Duncan Dull),  February 05, 2009 12:27 PM  Additional Follow-up for Phone Call Additional follow up Details #1::        Pt asks if OK to take omeperazole.  States after had EGD Dr. Jarold Motto told her to stay on Aciphex.  States Aciphex made her feel funny.  Had alot of gas.  Pt felt like omeperazole has been the most helpful and had the least side effects.  Would like to stay on this if OK with Dr. Jarold Motto.  Hulan Saas is working on prior [place]) Additional Follow-up by: Ashok Cordia RN,  February 10, 2009 2:58 PM    Additional Follow-up for Phone Call Additional follow up Details #2::    patient aware prior auth approved , pharm aware  Left message for pt that OK with Dr. Jarold Motto to stay on omeperazole. Ashok Cordia RN  February 17, 2009 8:47 AM Follow-up by: Harlow Mares CMA Duncan Dull),  February 10, 2009 4:46 PM  Additional Follow-up for Phone Call Additional follow up Details #3:: Details for Additional Follow-up Action Taken: prilosec ok Additional Follow-up by: Mardella Layman MD [facility],  February 17, 2009 8:25 AM

## 2010-06-30 NOTE — Progress Notes (Signed)
Summary: triage  Phone Note From Other Clinic Call back at [phone]   Caller: Kirstin, [place] Call For: Dr. Jarold Motto Reason for Call: Schedule Patient Appt Summary of Call: would like pt seen asap for severe diarrhea Initial call taken by: Vallarie Mare,  September 15, 2009 4:19 PM  Follow-up for Phone Call        Pt had total knee replacement one week ago.  Was given antiobiotics.  Now having severe dairrhea.  Pt on lovonox inj.   Appt sch to see Rozetta Nunnery on 09/16/09. Follow-up by: Ashok Cordia RN,  September 15, 2009 4:37 PM

## 2010-06-30 NOTE — Letter (Signed)
Summary: Patient [facility] Biopsy Results  [facility]  [address] [place], [place] [address]   Phone: [phone]  Fax: [phone]        September 22, 2008 MRN: [id number]    MARGARET COCKERILL [address] [place], [place]  [address]    Dear Ms. Schlotterbeck,  I am pleased to inform you that the biopsies taken during your recent endoscopic examination did not show any evidence of cancer upon pathologic examination.Biopsies showed no evidence of celiac disease.  Additional information/recommendations:  __No further action is needed at this time.  Please follow-up with      your primary care physician for your other healthcare needs.  __ Please call [phone] to schedule a return visit to review      your condition.  xx__ Continue with the treatment plan as outlined on the day of your      exam.  __ You should have a repeat endoscopic examination for this problem              in _ months/years.   Please call us if you are having persistent problems or have questions about your condition that have not been fully answered at this time.  Sincerely,  Mardella Layman MD [facility]  This letter has been electronically signed by your physician.

## 2010-06-30 NOTE — Medication Information (Signed)
Summary: Prior autho & approved for Omeprazole/[employer]  Prior autho & approved for Omeprazole/[employer]   Imported By: Sherian Rein 03/11/2010 11:02:10  _____________________________________________________________________  External Attachment:    Type:   Image     Comment:   External Document

## 2010-06-30 NOTE — Assessment & Plan Note (Signed)
Summary: STOMACH ISSUES/DISCUSS RECALL COLON/YF   History of Present Illness Visit Type: follow up Primary GI MD: Sheryn Bison MD FACP FAGA Primary Provider: Merlene Laughter, MD Chief Complaint: Patient here to discuss problems with reflux, excessive belching as well as some dysphagia to solid foods at times.  Patient denies any odynophagia.  Patient notes that she is very stressed. Patient also notes that she stays constipated. History of Present Illness:   This 70 year old white female has a long history of GERD and irritable bowel syndrome with diverticulosis coli and recurrent colon polyps. She presents today with solid food dysphagia for several weeks with a history of previous endoscopy and esophageal dilatations several years ago. She's had recurrent chest pain since September and apparent had a negative cardiopulmonary evaluation by Dr. Pete Glatter and was replaced on AcipHex 20 mg a day. She's also had a flare of IBS with abdominal cramping, gas, bloating, and mostly constipation. She does use p.r.n. MiraLax and daily Colace. She is taking other medications listed and reviewed her chart including various asthma meds, Synthroid, and aspirin.  Because of her chronic abdominal gas and bloating I did treat her for bacterial overgrowth syndrome in May of 2007 with good response. Was noted at that time that she also has fibromyalgia and is followed by rheumatology. She additionally is on calcium and vitamin D for osteoporosis. She follows a regular diet and denies any specific food intolerances, anorexia or weight loss. Her upper abdominal pain is better but she continues to have subxiphoid discomfort. In reviewing her chart I cannot see previous ultrasound exam of her gallbladder.   GI Review of Systems    Reports acid reflux, belching, bloating, chest pain, dysphagia with solids, and  heartburn.      Denies loss of appetite, nausea, vomiting, and  weight loss.      Reports constipation,  hemorrhoids, and  irritable bowel syndrome.     Denies anal fissure, black tarry stools, change in bowel habit, diarrhea, diverticulosis, fecal incontinence, heme positive stool, jaundice, light color stool, liver problems, rectal bleeding, and  rectal pain.   Prior Medications Reviewed Using: Patient Recall  Updated Prior Medication List: FLUTICASONE PROPIONATE 50 MCG/ACT SUSP (FLUTICASONE PROPIONATE) Take 2 sprays in each nostril CLARITIN 10 MG TABS (LORATADINE) Take 1 tablet by mouth as needed SINGULAIR 10 MG TABS (MONTELUKAST SODIUM) Take 1 tablet by mouth once a day as needed PROAIR HFA 108 (90 BASE) MCG/ACT AERS (ALBUTEROL SULFATE) Take as needed ALVESCO 160 MCG/ACT AERS (CICLESONIDE) Take 2 puffs every 12 hours ACIPHEX 20 MG TBEC (RABEPRAZOLE SODIUM) Take 1 tablet by mouth once a day SIMPLY SALINE 0.9 % AERS (SALINE) Use every am and pm CALTRATE 600+D 600-400 MG-UNIT TABS (CALCIUM CARBONATE-VITAMIN D) Take 2-3 tablets by mouth once daily CHEWABLE VITAMIN C 500 MG CHEW (ASCORBIC ACID) Take 2 tablets by mouth once daily VITAMIN E COMPLETE  CAPS (VITAMIN MIXTURE) Take 1 tablet by mouth once a day UMECTA 40 % SUSP (UREA) Apply as needed MIRALAX  POWD (POLYETHYLENE GLYCOL 3350) Take 1 scoop once daily as needed COLACE 50 MG CAPS (DOCUSATE SODIUM) Take 1 tablet by mouth once a day ESTRADIOL 0.5 MG TABS (ESTRADIOL) Take 1/2 tablet by mouth once daily VAGIFEM 25 MCG TABS (ESTRADIOL) 2 times per week SYNTHROID 125 MCG TABS (LEVOTHYROXINE SODIUM) Take 1 tablet by mouth once a day ASPIRIN 81 MG TBEC (ASPIRIN) Take 1 tablet by mouth once a day  Current Allergies (reviewed today): ! ERYTHROMYCIN Past Medical History:  Reviewed history from 07/10/2008 and no changes required:       Current Problems:        HYPOTHYROIDISM (ICD-244.9)       DEGENERATIVE JOINT DISEASE (ICD-715.90)       IRRITABLE BOWEL SYNDROME (ICD-564.1)       DEPRESSION (ICD-311)       ANXIETY (ICD-300.00)        FIBROMYALGIA (ICD-729.1)       DIVERTICULOSIS, COLON (ICD-562.10)       ADENOCARCINOMA, COLON, FAMILY HX (ICD-V16.0)       GERD (ICD-530.81)       HIATAL HERNIA (ICD-553.3)       ESOPHAGEAL STRICTURE (ICD-530.3)       INTERNAL HEMORRHOIDS (ICD-455.0)         Past Surgical History:    Reviewed history from 07/10/2008 and no changes required:       Hysterectomy       lumpectomy-left breast       Right knee arthroscopy       Left knee arthroscopy       Left eye surgery       Tonsillectomy       Deviated septum repair   Family History:    Reviewed history and no changes required:       No FH of Colon Cancer:       Family History of Kidney Disease: Sister ?       Family History of Irritable Bowel Syndrome: Sister x 2, Mother       Family History of Heart Disease: Father       Family History of Diabetes: Sister  Social History:    Reviewed history and no changes required:       Occupation: Retired/part time antique shop        Married       Patient is a former smoker. -stopped age 80       Alcohol Use - no       Daily Caffeine Use-2 cups daily       Illicit Drug Use - no       Patient does not get regular exercise.   Risk Factors:  Tobacco use:  quit    Year quit:  age 21  Drug use:  no Caffeine use:  3 drinks per day Alcohol use:  no Exercise:  no  Vital Signs:  Patient Profile:   70 Years Old Female Height:     67.5 inches Weight:      199 pounds BMI:     30.82 BSA:     2.03 Pulse rate:   72 / minute Pulse rhythm:   regular BP sitting:   144 / 86  (right arm)  Vitals Entered By: Hortense Ramal CMA (July 12, 2008 11:32 AM)                  Physical Exam  General:     Well developed, well nourished, no acute distress.healthy appearing.   Head:     Normocephalic and atraumatic. Eyes:     PERRLA, no icterus.exam deferred to patient's ophthalmologist.   Lungs:     Clear throughout to auscultation. Heart:     Regular rate and rhythm; no murmurs,  rubs,  or bruits. Abdomen:     Soft, nontender and nondistended. No masses, hepatosplenomegaly or hernias noted. Normal bowel sounds. Rectal:     Normal exam.hemocult negative.   Extremities:     No clubbing, cyanosis, edema or deformities  noted. Neurologic:     Alert and  oriented x4;  grossly normal neurologically. Psych:     Alert and cooperative. Normal mood and affect.   Impression & Recommendations:  Problem # 1:  IRRITABLE BOWEL SYNDROME (ICD-564.1) Assessment: Deteriorated I will try Librax one p.o. t.i.d. and check follow up colonoscopy along with multiple lab parameters. She been under a lot of personal stress recently which is probably flared her IBS. She may need repeat treatment for bacterial overgrowth syndrome dependent on her workup and clinical course. 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 Orders:  Colon/Endo (Colon/Endo)   Other Orders: Ultrasound Abdomen (UAS)   Patient Instructions: 1)  Copy Sent To:Dr. Hal Stoneking. 2)  Please Continue current medications.  3)  Upper abdominal ultrasound scheduled 4)  Endoscopy with probable dilatation 5)  Followup colonoscopy exam 6)  High-fiber diet with daily liberal p.o. fluids and nightly MiraLax 7)  Labs pending 8)  Constipation and  Hemorrhoids brochure given. 9)  Colonoscopy and Flexible Sigmoidoscopy brochure given. 10)  Conscious Sedation brochure given. 11)  Upper Endoscopy brochure given. 12)  [facility] Patient Information Guide given to patient.  Prescriptions: LIBRAX 2.5-5 MG  CAPS (CLIDINIUM-CHLORDIAZEPOXIDE) take one by mouth three times a day  #90 x 3   Entered by:   Harlow Mares CMA   Authorized by:   Mardella Layman MD FACG,FAGA   Signed by:   Harlow Mares CMA on 07/12/2008   Method used:   Electronically to        [facility]  [address] [phone]* (retail)       [address] [name], [place]  [address]       Ph: [phone] or [phone]       Fax: [phone]   RxID:   [id number] MOVIPREP 100 GM  SOLR (PEG-KCL-NACL-NASULF-NA ASC-C) As per prep instructions.  #1 x 0   Entered by:   Harlow Mares CMA   Authorized by:   Mardella Layman MD FACG,FAGA   Signed by:   Harlow Mares CMA on 07/12/2008   Method used:   Electronically to        [facility]  [address] [phone]* (retail)       [address]       [place], [place]  [address]       Ph: [phone] or [phone]       Fax: [phone]   RxID:   [id number]

## 2010-06-30 NOTE — Progress Notes (Signed)
Summary: ? re meds  Phone Note Call from Patient Call back at Home Phone [phone]   Caller: Patient Call For: Wade Sigala Reason for Call: Talk to Nurse Summary of Call: Patient wants to know if theres a generic for Aciphex, and wants to know if she needs to take Align indefinetly Initial call taken by: Tawni Levy,  November 05, 2008 9:21 AM  Follow-up for Phone Call        mailed patient a discount card for aciphex. and she did nto mention the laign but if she calls back she can continue to tkae it and buy it OTC.  Follow-up by: Harlow Mares CMA,  November 05, 2008 11:29 AM

## 2010-06-30 NOTE — Procedures (Signed)
Summary: Colon   Colonoscopy  Procedure date:  07/29/2003  Findings:      Location:  [facility].    Patient Name: Kimberly [name], Kimberly [name] MRN:  Procedure Procedures: Colonoscopy CPT: 02725.  Personnel: Endoscopist: Vania Rea. Jarold Motto, MD.  Exam Location: Exam performed in Outpatient Clinic. Outpatient  Patient Consent: Procedure, Alternatives, Risks and Benefits discussed, consent obtained, from patient. Consent was obtained by the RN.  Indications Symptoms: Constipation Patient has difficulty evacuating, strains with stool passage. Hematochezia.  Surveillance of: Adenomatous Polyp(s).  History  Current Medications: Patient is not currently taking Coumadin.  Pre-Exam Physical: Performed Jul 29, 2003. Cardio-pulmonary exam, Rectal exam, Abdominal exam, Extremity exam, Mental status exam WNL.  Exam Exam: Extent of exam reached: Cecum, extent intended: Cecum.  The cecum was identified by appendiceal orifice and IC valve. Patient position: on left side. Duration of exam: 20 minutes. Colon retroflexion performed. Images taken. ASA Classification: II. Tolerance: excellent.  Monitoring: Pulse and BP monitoring, Oximetry used. Supplemental O2 given. at 2 Liters.  Colon Prep Used Golytely for colon prep. Prep results: excellent.  Sedation Meds: Patient assessed and found to be appropriate for moderate (conscious) sedation. Fentanyl 75 mcg. given IV. Versed 5 mg. given IV.  Instrument(s): CF 140L. Serial [id number].  Findings - NORMAL EXAM: Cecum to Rectum. Not Seen: Polyps. AVM's. Colitis. Tumors. Melanosis. Crohn's. Diverticulosis.  - HEMORRHOIDS: Internal. Size: Large. Not bleeding. Not thrombosed. ICD9: Hemorrhoids, Internal: 455.0.   Assessment Normal examination.  Diagnoses: 455.0: Hemorrhoids, Internal.   Events  Unplanned Interventions: No intervention was required.  Plans Medication Plan: Continue current medications.  Patient Education:  Patient given standard instructions for: Hemorrhoids. Constipation. Disposition: After procedure patient sent to recovery. After recovery patient sent home.  Scheduling/Referral: EGD, to [facility]. Jarold Motto, MD, on Jul 29, 2003.     This report was created from the original endoscopy report, which was reviewed and signed by the above listed endoscopist.

## 2010-06-30 NOTE — Progress Notes (Signed)
Summary: ? re stools sampe  Phone Note Call from Patient Call back at Home Phone [phone]   Caller: Patient Call For: Gunnar Fusi Reason for Call: Talk to Nurse Summary of Call: Patient states that Dr Lavella Hammock would like for her stools to be checked for blood, wants to know if thats possible. Initial call taken by: Tawni Levy,  September 17, 2009 9:00 AM  Follow-up for Phone Call        Pt asking if we are checking stool for blood.  Kristen the PA who referred pt to [place] asked pt to get [place] to check for blood because pt is on the lovenox.  Pt will be coming by the lab this pm to drop off stool cultures. Follow-up by: Ashok Cordia RN,  September 17, 2009 9:49 AM  Additional Follow-up for Phone Call Additional follow up Details #1::        Per Rozetta Nunnery NP,  stool for blood not done due to false positives associated with diarrhea and rectal irritation.  If concern for GI bleed pt can get hgb/ hct checked.  Pt notified.  Pt states she can not come it this pm due to change in her physical therapy schedule.  Pt states diarrhea has stopped.  She had a normal BM today.. Additional Follow-up by: Ashok Cordia RN,  September 17, 2009 11:10 AM

## 2010-07-08 ENCOUNTER — Ambulatory Visit: Payer: BC Managed Care – PPO | Admitting: Women's Health

## 2010-07-08 DIAGNOSIS — B373 Candidiasis of vulva and vagina: Secondary | ICD-10-CM

## 2010-07-08 DIAGNOSIS — N898 Other specified noninflammatory disorders of vagina: Secondary | ICD-10-CM

## 2010-07-08 DIAGNOSIS — B3731 Acute candidiasis of vulva and vagina: Secondary | ICD-10-CM

## 2010-07-23 ENCOUNTER — Encounter: Payer: Self-pay | Admitting: Obstetrics and Gynecology

## 2010-08-06 ENCOUNTER — Encounter (INDEPENDENT_AMBULATORY_CARE_PROVIDER_SITE_OTHER): Payer: Medicare Other | Admitting: Obstetrics and Gynecology

## 2010-08-06 DIAGNOSIS — N952 Postmenopausal atrophic vaginitis: Secondary | ICD-10-CM

## 2010-08-06 DIAGNOSIS — B373 Candidiasis of vulva and vagina: Secondary | ICD-10-CM

## 2010-08-06 DIAGNOSIS — B3731 Acute candidiasis of vulva and vagina: Secondary | ICD-10-CM

## 2010-08-06 DIAGNOSIS — N898 Other specified noninflammatory disorders of vagina: Secondary | ICD-10-CM

## 2010-08-06 DIAGNOSIS — N951 Menopausal and female climacteric states: Secondary | ICD-10-CM

## 2010-08-11 ENCOUNTER — Other Ambulatory Visit (INDEPENDENT_AMBULATORY_CARE_PROVIDER_SITE_OTHER): Payer: Medicare Other

## 2010-08-11 DIAGNOSIS — R81 Glycosuria: Secondary | ICD-10-CM

## 2010-08-19 LAB — DIFFERENTIAL
Basophils Absolute: 0.1 10*3/uL (ref 0.0–0.1)
Basophils Relative: 1 % (ref 0–1)
Eosinophils Absolute: 0.1 10*3/uL (ref 0.0–0.7)
Eosinophils Relative: 2 % (ref 0–5)
Lymphocytes Relative: 35 % (ref 12–46)
Lymphs Abs: 2.6 10*3/uL (ref 0.7–4.0)
Monocytes Absolute: 0.6 10*3/uL (ref 0.1–1.0)
Monocytes Relative: 8 % (ref 3–12)
Neutro Abs: 4 10*3/uL (ref 1.7–7.7)
Neutrophils Relative %: 54 % (ref 43–77)

## 2010-08-19 LAB — COMPREHENSIVE METABOLIC PANEL
ALT: 17 U/L (ref 0–35)
AST: 23 U/L (ref 0–37)
Albumin: 4.1 g/dL (ref 3.5–5.2)
Alkaline Phosphatase: 52 U/L (ref 39–117)
BUN: 10 mg/dL (ref 6–23)
CO2: 28 mEq/L (ref 19–32)
Calcium: 9.7 mg/dL (ref 8.4–10.5)
Chloride: 104 mEq/L (ref 96–112)
Creatinine, Ser: 0.73 mg/dL (ref 0.4–1.2)
GFR calc Af Amer: >60 mL/min (ref >60)
GFR calc non Af Amer: >60 mL/min (ref >60)
Glucose, Bld: 88 mg/dL (ref 70–99)
Potassium: 3.8 mEq/L (ref 3.5–5.1)
Sodium: 137 mEq/L (ref 135–145)
Total Bilirubin: 0.6 mg/dL (ref 0.3–1.2)
Total Protein: 7 g/dL (ref 6.0–8.3)

## 2010-08-19 LAB — URINE CULTURE
Colony Count: NO GROWTH
Culture: NO GROWTH

## 2010-08-19 LAB — BASIC METABOLIC PANEL
BUN: 6 mg/dL (ref 6–23)
BUN: 7 mg/dL (ref 6–23)
CO2: 26 mEq/L (ref 19–32)
CO2: 28 mEq/L (ref 19–32)
Calcium: 8.2 mg/dL — ABNORMAL LOW (ref 8.4–10.5)
Calcium: 8.8 mg/dL (ref 8.4–10.5)
Chloride: 106 mEq/L (ref 96–112)
Chloride: 108 mEq/L (ref 96–112)
Creatinine, Ser: 0.66 mg/dL (ref 0.4–1.2)
Creatinine, Ser: 0.68 mg/dL (ref 0.4–1.2)
GFR calc Af Amer: >60 mL/min (ref >60)
GFR calc Af Amer: >60 mL/min (ref >60)
GFR calc non Af Amer: >60 mL/min (ref >60)
GFR calc non Af Amer: >60 mL/min (ref >60)
Glucose, Bld: 103 mg/dL — ABNORMAL HIGH (ref 70–99)
Glucose, Bld: 108 mg/dL — ABNORMAL HIGH (ref 70–99)
Potassium: 3.6 mEq/L (ref 3.5–5.1)
Potassium: 3.7 mEq/L (ref 3.5–5.1)
Sodium: 138 mEq/L (ref 135–145)
Sodium: 139 mEq/L (ref 135–145)

## 2010-08-19 LAB — CBC
HCT: 28 % — ABNORMAL LOW (ref 36.0–46.0)
HCT: 32.8 % — ABNORMAL LOW (ref 36.0–46.0)
HCT: 41.7 % (ref 36.0–46.0)
Hemoglobin: 11.2 g/dL — ABNORMAL LOW (ref 12.0–15.0)
Hemoglobin: 14.2 g/dL (ref 12.0–15.0)
Hemoglobin: 9.8 g/dL — ABNORMAL LOW (ref 12.0–15.0)
MCHC: 34 g/dL (ref 30.0–36.0)
MCHC: 34.2 g/dL (ref 30.0–36.0)
MCHC: 35 g/dL (ref 30.0–36.0)
MCV: 93.2 fL (ref 78.0–100.0)
MCV: 94.6 fL (ref 78.0–100.0)
MCV: 94.9 fL (ref 78.0–100.0)
Platelets: 143 10*3/uL — ABNORMAL LOW (ref 150–400)
Platelets: 172 10*3/uL (ref 150–400)
Platelets: 222 10*3/uL (ref 150–400)
RBC: 3 MIL/uL — ABNORMAL LOW (ref 3.87–5.11)
RBC: 3.45 MIL/uL — ABNORMAL LOW (ref 3.87–5.11)
RBC: 4.41 MIL/uL (ref 3.87–5.11)
RDW: 12.4 % (ref 11.5–15.5)
RDW: 12.5 % (ref 11.5–15.5)
RDW: 12.9 % (ref 11.5–15.5)
WBC: 11 10*3/uL — ABNORMAL HIGH (ref 4.0–10.5)
WBC: 7.4 10*3/uL (ref 4.0–10.5)
WBC: 9.5 10*3/uL (ref 4.0–10.5)

## 2010-08-19 LAB — URINE MICROSCOPIC-ADD ON

## 2010-08-19 LAB — URINALYSIS, ROUTINE W REFLEX MICROSCOPIC
Bilirubin Urine: NEGATIVE
Bilirubin Urine: NEGATIVE
Glucose, UA: NEGATIVE mg/dL
Glucose, UA: NEGATIVE mg/dL
Ketones, ur: 15 mg/dL — AB
Ketones, ur: 15 mg/dL — AB
Leukocytes, UA: NEGATIVE
Leukocytes, UA: NEGATIVE
Nitrite: NEGATIVE
Nitrite: NEGATIVE
Protein, ur: NEGATIVE mg/dL
Protein, ur: NEGATIVE mg/dL
Specific Gravity, Urine: 1.006 (ref 1.005–1.030)
Specific Gravity, Urine: 1.008 (ref 1.005–1.030)
Urobilinogen, UA: 0.2 mg/dL (ref 0.0–1.0)
Urobilinogen, UA: 0.2 mg/dL (ref 0.0–1.0)
pH: 6 (ref 5.0–8.0)
pH: 7.5 (ref 5.0–8.0)

## 2010-08-19 LAB — ABO/RH: ABO/RH(D): A POS

## 2010-08-19 LAB — TYPE AND SCREEN
ABO/RH(D): A POS
Antibody Screen: NEGATIVE

## 2010-08-19 LAB — PROTIME-INR
INR: 1.02 (ref 0.00–1.49)
Prothrombin Time: 13.3 seconds (ref 11.6–15.2)

## 2010-08-19 LAB — APTT: aPTT: 30 seconds (ref 24–37)

## 2010-08-20 ENCOUNTER — Other Ambulatory Visit: Payer: Medicare Other

## 2010-09-03 ENCOUNTER — Other Ambulatory Visit: Payer: Self-pay | Admitting: Internal Medicine

## 2010-09-03 DIAGNOSIS — R609 Edema, unspecified: Secondary | ICD-10-CM

## 2010-09-04 ENCOUNTER — Ambulatory Visit
Admission: RE | Admit: 2010-09-04 | Discharge: 2010-09-04 | Disposition: A | Discharge status: Home / Self Care | Payer: Medicare Other | Source: Ambulatory Visit | Attending: Internal Medicine | Admitting: Internal Medicine

## 2010-09-04 DIAGNOSIS — R609 Edema, unspecified: Secondary | ICD-10-CM

## 2010-10-16 NOTE — Op Note (Signed)
NAME:  Kimberly [name], Kimberly [name]                         ACCOUNT NO.:  [id number]   MEDICAL RECORD NO.:  [id number]                   PATIENT TYPE:  AMB   LOCATION:  [facility]                                  FACILITY:  [facility]   PHYSICIAN:  Robert A. Thurston Hole, M.D.              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   OPERATION PERFORMED:  1. Left knee examination under anesthesia followed by arthroscopic partial     lateral meniscectomy.  2. Left knee chondroplasty.  3. Left knee lateral retinacular release.  4. Left knee partial synovectomy.   SURGEON:  Elana Alm. Thurston Hole, M.D.   ASSISTANT:  Julien Girt, P.A.   ANESTHESIA:  Local with MAC.   OPERATIVE TIME:  30 minutes.   COMPLICATIONS:  None.   INDICATIONS FOR PROCEDURE:  Ms. Hand is a 70 year old woman who has had  over a year of left knee pain with exam and MRI documenting lateral meniscal  tear, chondromalacia and synovitis with possible lateral patellar  subluxation who has failed conservative care and is now to undergo  arthroscopy.   DESCRIPTION OF PROCEDURE:  [name] was brought to the operating room on  August 12, 2003 after a knee block had been placed in the holding room by  anesthesia.  She was placed on the operating table in supine position.  Her  left knee was examined under anesthesia.  Range of motion 0 to 125 degrees,  1 to 2+ crepitation, knee stable to ligamentous exam with mild lateral  patellar tracking.  Left leg was prepped using sterile DuraPrep and draped  using sterile technique.  Originally, through an anterolateral portal, the  arthroscope with a pump attached was  placed and through an anteromedial  portal, an arthroscopic probe was placed.  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  Medial meniscus was intact.  Intercondylar notch inspected.  Anterior and posterior cruciate ligaments were normal.  Lateral compartment  inspected.  30% grade 3 chondromalacia which was debrided.  Lateral meniscal  tear anterior lateral and posterior horn 40 to 50% was resected back to  stable rim.  Patellofemoral joint showed 50% grade 3 chondromalacia on the  patellofemoral groove and this was debrided.  Lateral patellar tracking was  noted and a lateral release was carried out decompressing the patellofemoral  joint and improving patellar tracking.  No excessive bleeding was  encountered.  Moderate synovitis in the medial and lateral gutters were  debrided.  Otherwise they were free of pathology.  After this was done it  was  felt that all pathology had been satisfactorily addressed.  The  instruments were removed.  Portals were closed with 3-0 nylon suture and  injected with 0.25% Marcaine with epinephrine and 4 mg of morphine.  Sterile  dressings applied and the patient awakened and taken to recovery room in  stable condition.   FOLLOW UP:  [name] will be followed as an outpatient on Vicodin and  Naprosyn.  See her back in the office in a week for suture removal and  follow-up.                                               Robert A. Thurston Hole, M.D.    RAW/MEDQ  D:  08/12/2003  T:  08/12/2003  Job:  [id number]

## 2010-10-28 ENCOUNTER — Encounter (INDEPENDENT_AMBULATORY_CARE_PROVIDER_SITE_OTHER): Payer: Medicare Other | Admitting: Vascular Surgery

## 2010-10-28 ENCOUNTER — Encounter (INDEPENDENT_AMBULATORY_CARE_PROVIDER_SITE_OTHER): Payer: Medicare Other

## 2010-10-28 DIAGNOSIS — I83893 Varicose veins of bilateral lower extremities with other complications: Secondary | ICD-10-CM

## 2010-10-28 NOTE — Consult Note (Signed)
NEW PATIENT CONSULTATION  Kimberly [name], Kimberly [name] DOB:  03-16-1941                                       10/28/2010 ZOXWR#:[id number]  Patient presents today for evaluation of lower extremity venous pathology.  She had an episode several weeks ago which sounds like superficial thrombophlebitis in her right pretibial area.  She had a prominent telangiectasia of this area and developed some pain and erythema over the pretibial area.  She was treated appropriately, and this has continued to improve.  She does have some persistent telangiectasia over this area but no large varicosities.  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  She did have a history of prior sclerotherapy in the years past for improvement in symptoms.  She has no major medical difficulties.  SOCIAL HISTORY:  She is married.  She has 1 adopted child.  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  She quit smoking in the [name] 80s and does not drink alcohol.  FAMILY HISTORY:  Significant for varicosities in her mother, congestive heart failure in her father, and 4 sisters all have some level of varicosities in their legs.  REVIEW OF SYSTEMS:  No weight loss or gain.  She is 5 feet 8 inches tall.  She does report some discomfort in her feet with prolonged standing.  She has a history of lacunar infarct. GI:  Positive for reflux, constipation. NEUROLOGIC:  Headaches. PULMONARY:  Occasional productive cough. MUSCULOSKELETAL:  Multiple difficulties with degenerative disk disease in her back, arthritis, and joint pain. She does have a history of anxiety.  PHYSICAL EXAMINATION:  A well-developed and well-nourished white female appearing her stated age in no acute distress.  Blood pressure is 142/82, pulse 65, respirations 18.  HEENT:  Normal.  She does have palpable radial and dorsalis pedis pulses bilaterally.  Musculoskeletal shows no major deformity or cyanosis.  She  had a well-healed incision from her prior right total knee replacement.  Neurologic:  No focal weakness or paresthesias.  Skin without ulcer or rashes.  She does have the telangiectasias described in her pretibial area on the right.  She underwent noninvasive vascular laboratory studies in our office, and this reveals reflux in her surface veins and the saphenous veins bilaterally.  There is no significant reflux in the deep system.  Her great saphenous veins are slightly enlarged.  I imaged these with the SonoSite myself, and this does not extend to the level of these concerned in her pretibial area.  There are a few scattered reticular veins below the skin in this area but no large varices.  I reassured patient with this discussion.  I explained that she could have treatment of her reflux in her saphenous vein but certainly would not recommend this since I do not feel that her symptoms are related to this.  She has had this 1 episode of isolated superficial thrombophlebitis by history and would therefore recommend no specific invasive treatment.  She understands that should she have recurrence of these episodes that Advil would be appropriate treatment.  She is interested in potentially proceeding with sclerotherapy for these telangiectasia for cosmetic purposes and will notify us should she wish to proceed, otherwise she will see [place] again on an as-needed basis.    Larina Earthly, M.D. Electronically Signed  TFE/MEDQ  D:  10/28/2010  T:  10/28/2010  Job:  [id number]  cc:   Hal T. Stoneking, M.D.

## 2010-11-04 NOTE — Procedures (Unsigned)
LOWER EXTREMITY VENOUS REFLUX EXAM  INDICATION:  Painful varicose veins.  EXAM:  Using color-flow imaging and pulse Doppler spectral analysis, the bilateral common femoral, superficial femoral, popliteal, posterior tibial, greater and lesser saphenous veins are evaluated.  There is evidence suggesting deep venous insufficiency in the left femoral vein.  The bilateral saphenofemoral junctions are not competent with Reflux of >590milliseconds. The bilateral GSV's are not competent with Reflux of >52milliseconds with the caliber as described below.  The bilateral proximal short saphenous veins demonstrate competency.  GSV Diameter (used if found to be incompetent only)                                           Right    Left Proximal Greater Saphenous Vein           0.41 cm  0.59 cm Proximal-to-mid-thigh                     0.33 cm  cm Mid thigh                                 0.41 cm  0.25 cm Mid-distal thigh                          cm       cm Distal thigh                              0.42 cm  0.21 cm Knee                                      0.3 cm   0.32 cm  IMPRESSION: 1. Bilateral greater saphenous veins are not competent with reflux     >540milliseconds. 2. The bilateral greater saphenous vein is not tortuous. 3. The deep venous system is not competent in the left femoral vein     with reflux >500 milliseconds. 4. The bilateral lesser saphenous vein is competent.  ___________________________________________ Larina Earthly, M.D.  LT/MEDQ  D:  10/28/2010  T:  10/28/2010  Job:  [id number]

## 2010-11-12 ENCOUNTER — Other Ambulatory Visit: Payer: Self-pay | Admitting: Obstetrics and Gynecology

## 2010-11-12 DIAGNOSIS — Z1231 Encounter for screening mammogram for malignant neoplasm of breast: Secondary | ICD-10-CM

## 2010-12-01 ENCOUNTER — Ambulatory Visit
Admission: RE | Admit: 2010-12-01 | Discharge: 2010-12-01 | Disposition: A | Discharge status: Home / Self Care | Payer: Medicare Other | Source: Ambulatory Visit | Attending: Obstetrics and Gynecology | Admitting: Obstetrics and Gynecology

## 2010-12-01 DIAGNOSIS — Z1231 Encounter for screening mammogram for malignant neoplasm of breast: Secondary | ICD-10-CM

## 2011-03-08 ENCOUNTER — Other Ambulatory Visit: Payer: Self-pay | Admitting: Women's Health

## 2011-06-01 HISTORY — PX: CATARACT EXTRACTION: SUR2

## 2011-06-19 ENCOUNTER — Other Ambulatory Visit: Payer: Self-pay | Admitting: Gastroenterology

## 2011-06-21 ENCOUNTER — Telehealth: Payer: Self-pay | Admitting: Gastroenterology

## 2011-06-22 NOTE — Telephone Encounter (Signed)
Pt called just to make sure her refill for Omeprazole was handled and she reported it was. Pt is very anxious d/t her husband's recent bout with thyroid cancer; they are making frequent trips to [facility] and he's on a very strict diet. She reports an increase in reflux that wakes her up d/t gagging. She reports she's had a cold with Zpack tx and is still having drainage. She saw Dr Pete Glatter yesterday who instructed her to continue the Mucinex and increase her Omeprazole to BID. Explained to pt if the bid omeprazole doesn't work, call and we can give her samples of a stronger PPI or if s&s worsen. Pt stated understanding.

## 2011-08-09 ENCOUNTER — Encounter: Payer: Self-pay | Admitting: Gynecology

## 2011-08-09 DIAGNOSIS — N938 Other specified abnormal uterine and vaginal bleeding: Secondary | ICD-10-CM | POA: Insufficient documentation

## 2011-08-17 ENCOUNTER — Encounter: Payer: Self-pay | Admitting: Obstetrics and Gynecology

## 2011-08-17 ENCOUNTER — Ambulatory Visit (INDEPENDENT_AMBULATORY_CARE_PROVIDER_SITE_OTHER): Payer: Medicare Other | Admitting: Obstetrics and Gynecology

## 2011-08-17 VITALS — BP 124/80 | Ht 67.5 in | Wt 195.0 lb

## 2011-08-17 DIAGNOSIS — M84376A Stress fracture, unspecified foot, initial encounter for fracture: Secondary | ICD-10-CM

## 2011-08-17 DIAGNOSIS — H269 Unspecified cataract: Secondary | ICD-10-CM | POA: Insufficient documentation

## 2011-08-17 DIAGNOSIS — K649 Unspecified hemorrhoids: Secondary | ICD-10-CM

## 2011-08-17 DIAGNOSIS — R3915 Urgency of urination: Secondary | ICD-10-CM

## 2011-08-17 DIAGNOSIS — S82899A Other fracture of unspecified lower leg, initial encounter for closed fracture: Secondary | ICD-10-CM | POA: Insufficient documentation

## 2011-08-17 DIAGNOSIS — IMO0002 Reserved for concepts with insufficient information to code with codable children: Secondary | ICD-10-CM | POA: Insufficient documentation

## 2011-08-17 DIAGNOSIS — Z780 Asymptomatic menopausal state: Secondary | ICD-10-CM

## 2011-08-17 DIAGNOSIS — N951 Menopausal and female climacteric states: Secondary | ICD-10-CM

## 2011-08-17 DIAGNOSIS — I6381 Other cerebral infarction due to occlusion or stenosis of small artery: Secondary | ICD-10-CM | POA: Insufficient documentation

## 2011-08-17 DIAGNOSIS — M8430XA Stress fracture, unspecified site, initial encounter for fracture: Secondary | ICD-10-CM

## 2011-08-17 DIAGNOSIS — N952 Postmenopausal atrophic vaginitis: Secondary | ICD-10-CM

## 2011-08-17 DIAGNOSIS — R350 Frequency of micturition: Secondary | ICD-10-CM

## 2011-08-17 DIAGNOSIS — E7800 Pure hypercholesterolemia, unspecified: Secondary | ICD-10-CM | POA: Insufficient documentation

## 2011-08-17 DIAGNOSIS — M21619 Bunion of unspecified foot: Secondary | ICD-10-CM | POA: Insufficient documentation

## 2011-08-17 MED ORDER — ESTRADIOL 0.1 MG/GM VA CREA: TOPICAL_CREAM | VAGINAL | Status: DC

## 2011-08-17 NOTE — Progress Notes (Signed)
Patient came to see me today for further followup. She has had 3 normal bone densities. However she sustained a stress fracture of her right foot found on MRI by her orthopedist. He did not mention treatment for thin  Bones. She has had no other fractures. She takes calcium and vitamin D. She has previously taken both Estrace cream and Vagifem for atrophic vaginitis. She currently is not on medication but would like to go back on vaginal estrogen. She is having some mild menopausal symptoms. She has had a stroke and doesn't want to take oral estrogen. She does have both urgency and nocturia. She has noticed when she gets constipated she gets a flareup of her hemorrhoids. She has occasionally noticed a spot of blood at that point. She is up-to-date on colonoscopies. She is having no vaginal bleeding. She is having no pelvic pain.  ROS: 12 system review done. Pertinent positives above. Other positives include hypothyroidism, lacunar  Infarction, GERD, diverticulosis, cataracts, degenerative joint disease,and dysphasia.  HEENT: Within normal limits. Kennon Portela present Neck: No masses. Supraclavicular lymph nodes: Not enlarged. Breasts: Examined in both sitting and lying position. Symmetrical without skin changes or masses. Abdomen: Soft no masses guarding or rebound. No hernias. Pelvic: External within normal limits. BUS within normal limits. Vaginal examination shows poor estrogen effect, no cystocele enterocele or rectocele. Cervix and uterus absent. Adnexa within normal limits.  Assessment: #1. Atrophic vaginitis #2. Hemorrhoids #3. Stress fracture of foot #4. Menopausal symptoms #5. Detrussor instability  Plan: Reinitiate Estrace cream. Continue yearly mammograms. Stool softeners when she gets constipated. Anymore bleeding rectally report to GI doctor. Observation of di. Rectovaginal confirmatory. Extremities within normal limits.

## 2011-08-18 LAB — URINALYSIS W MICROSCOPIC + REFLEX CULTURE
Bacteria, UA: NONE SEEN
Bilirubin Urine: NEGATIVE
Casts: NONE SEEN
Crystals: NONE SEEN
Glucose, UA: NEGATIVE mg/dL
Ketones, ur: NEGATIVE mg/dL
Leukocytes, UA: NEGATIVE
Nitrite: NEGATIVE
Protein, ur: NEGATIVE mg/dL
Specific Gravity, Urine: <1.005 — ABNORMAL LOW (ref 1.005–1.030)
Squamous Epithelial / LPF: NONE SEEN
Urobilinogen, UA: 0.2 mg/dL (ref 0.0–1.0)
pH: 6.5 (ref 5.0–8.0)

## 2011-09-29 ENCOUNTER — Other Ambulatory Visit: Payer: Self-pay | Admitting: Obstetrics and Gynecology

## 2011-09-29 DIAGNOSIS — Z13820 Encounter for screening for osteoporosis: Secondary | ICD-10-CM

## 2011-09-30 ENCOUNTER — Ambulatory Visit (INDEPENDENT_AMBULATORY_CARE_PROVIDER_SITE_OTHER): Payer: Medicare Other

## 2011-09-30 DIAGNOSIS — Z13820 Encounter for screening for osteoporosis: Secondary | ICD-10-CM

## 2011-09-30 DIAGNOSIS — M899 Disorder of bone, unspecified: Secondary | ICD-10-CM

## 2011-09-30 DIAGNOSIS — M8580 Other specified disorders of bone density and structure, unspecified site: Secondary | ICD-10-CM

## 2011-10-04 ENCOUNTER — Telehealth: Payer: Self-pay | Admitting: *Deleted

## 2011-10-06 ENCOUNTER — Ambulatory Visit: Payer: Medicare Other | Admitting: *Deleted

## 2011-10-08 ENCOUNTER — Encounter: Payer: Self-pay | Admitting: Obstetrics and Gynecology

## 2011-11-13 HISTORY — PX: MOUTH SURGERY: SHX715

## 2011-11-22 ENCOUNTER — Telehealth: Payer: Self-pay | Admitting: *Deleted

## 2011-11-22 MED ORDER — TERCONAZOLE 0.4 % VA CREA: 1.0000 | TOPICAL_CREAM | Freq: Every day | VAGINAL | Status: AC

## 2011-11-22 NOTE — Telephone Encounter (Signed)
Pt informed with the below ntoe, rx sent.

## 2011-11-22 NOTE — Telephone Encounter (Signed)
Please: Terazol 7 one applicator at bedtime x7. If no relief office visit.

## 2011-11-22 NOTE — Telephone Encounter (Signed)
Pt calling c/o yeast infection from being on clindamycin due to dental surgery. External itching and burning, cant really tell if discharge is there. She is requesting Terazol and nystatin to help relieve itching. Please advise

## 2011-12-16 ENCOUNTER — Other Ambulatory Visit: Payer: Self-pay | Admitting: Obstetrics and Gynecology

## 2011-12-16 DIAGNOSIS — Z1231 Encounter for screening mammogram for malignant neoplasm of breast: Secondary | ICD-10-CM

## 2011-12-24 ENCOUNTER — Ambulatory Visit: Payer: Medicare Other

## 2012-01-03 ENCOUNTER — Ambulatory Visit
Admission: RE | Admit: 2012-01-03 | Discharge: 2012-01-03 | Disposition: A | Discharge status: Home / Self Care | Payer: Medicare Other | Source: Ambulatory Visit | Attending: Obstetrics and Gynecology | Admitting: Obstetrics and Gynecology

## 2012-01-03 DIAGNOSIS — Z1231 Encounter for screening mammogram for malignant neoplasm of breast: Secondary | ICD-10-CM

## 2012-01-05 ENCOUNTER — Ambulatory Visit: Payer: Medicare Other | Admitting: Gastroenterology

## 2012-01-25 ENCOUNTER — Ambulatory Visit: Payer: Medicare Other | Admitting: Gastroenterology

## 2012-02-01 ENCOUNTER — Encounter: Payer: Self-pay | Admitting: Gastroenterology

## 2012-02-01 ENCOUNTER — Ambulatory Visit (INDEPENDENT_AMBULATORY_CARE_PROVIDER_SITE_OTHER): Payer: Medicare Other | Admitting: Gastroenterology

## 2012-02-01 ENCOUNTER — Other Ambulatory Visit (INDEPENDENT_AMBULATORY_CARE_PROVIDER_SITE_OTHER): Payer: Medicare Other

## 2012-02-01 VITALS — BP 120/64 | HR 72 | Ht 67.5 in | Wt 187.0 lb

## 2012-02-01 DIAGNOSIS — R6889 Other general symptoms and signs: Secondary | ICD-10-CM

## 2012-02-01 DIAGNOSIS — E039 Hypothyroidism, unspecified: Secondary | ICD-10-CM

## 2012-02-01 DIAGNOSIS — R079 Chest pain, unspecified: Secondary | ICD-10-CM

## 2012-02-01 DIAGNOSIS — R1319 Other dysphagia: Secondary | ICD-10-CM

## 2012-02-01 DIAGNOSIS — R1314 Dysphagia, pharyngoesophageal phase: Secondary | ICD-10-CM

## 2012-02-01 DIAGNOSIS — K219 Gastro-esophageal reflux disease without esophagitis: Secondary | ICD-10-CM

## 2012-02-01 DIAGNOSIS — R195 Other fecal abnormalities: Secondary | ICD-10-CM

## 2012-02-01 DIAGNOSIS — K5730 Diverticulosis of large intestine without perforation or abscess without bleeding: Secondary | ICD-10-CM

## 2012-02-01 DIAGNOSIS — R1310 Dysphagia, unspecified: Secondary | ICD-10-CM

## 2012-02-01 LAB — CBC WITH DIFFERENTIAL/PLATELET
Basophils Absolute: 0.1 10*3/uL (ref 0.0–0.1)
Basophils Relative: 1 % (ref 0.0–3.0)
Eosinophils Absolute: 0.2 10*3/uL (ref 0.0–0.7)
Eosinophils Relative: 2.4 % (ref 0.0–5.0)
HCT: 40.1 % (ref 36.0–46.0)
Hemoglobin: 13.7 g/dL (ref 12.0–15.0)
Lymphocytes Relative: 36.6 % (ref 12.0–46.0)
Lymphs Abs: 2.4 10*3/uL (ref 0.7–4.0)
MCHC: 34.2 g/dL (ref 30.0–36.0)
MCV: 91.1 fl (ref 78.0–100.0)
Monocytes Absolute: 0.6 10*3/uL (ref 0.1–1.0)
Monocytes Relative: 8.9 % (ref 3.0–12.0)
Neutro Abs: 3.3 10*3/uL (ref 1.4–7.7)
Neutrophils Relative %: 51.1 % (ref 43.0–77.0)
Platelets: 219 10*3/uL (ref 150.0–400.0)
RBC: 4.4 Mil/uL (ref 3.87–5.11)
RDW: 12.5 % (ref 11.5–14.6)
WBC: 6.5 10*3/uL (ref 4.5–10.5)

## 2012-02-01 LAB — HEPATIC FUNCTION PANEL
ALT: 16 U/L (ref 0–35)
AST: 25 U/L (ref 0–37)
Albumin: 4.2 g/dL (ref 3.5–5.2)
Alkaline Phosphatase: 47 U/L (ref 39–117)
Bilirubin, Direct: 0.1 mg/dL (ref 0.0–0.3)
Total Bilirubin: 0.7 mg/dL (ref 0.3–1.2)
Total Protein: 7.2 g/dL (ref 6.0–8.3)

## 2012-02-01 LAB — BASIC METABOLIC PANEL
BUN: 16 mg/dL (ref 6–23)
CO2: 27 mEq/L (ref 19–32)
Calcium: 10.3 mg/dL (ref 8.4–10.5)
Chloride: 102 mEq/L (ref 96–112)
Creatinine, Ser: 0.7 mg/dL (ref 0.4–1.2)
GFR: 83.6 mL/min (ref >60.00)
Glucose, Bld: 84 mg/dL (ref 70–99)
Potassium: 4.7 mEq/L (ref 3.5–5.1)
Sodium: 139 mEq/L (ref 135–145)

## 2012-02-01 LAB — IBC PANEL
Iron: 98 ug/dL (ref 42–145)
Saturation Ratios: 26.8 % (ref 20.0–50.0)
Transferrin: 260.9 mg/dL (ref 212.0–360.0)

## 2012-02-01 LAB — TSH: TSH: 0.25 u[IU]/mL — ABNORMAL LOW (ref 0.35–5.50)

## 2012-02-01 LAB — VITAMIN B12: Vitamin B-12: 558 pg/mL (ref 211–911)

## 2012-02-01 LAB — FERRITIN: Ferritin: 43.8 ng/mL (ref 10.0–291.0)

## 2012-02-01 MED ORDER — LINACLOTIDE 145 MCG PO CAPS: 145.0000 ug | ORAL_CAPSULE | Freq: Every day | ORAL | Status: DC

## 2012-02-01 MED ORDER — DEXLANSOPRAZOLE 60 MG PO CPDR: 60.0000 mg | DELAYED_RELEASE_CAPSULE | Freq: Every day | ORAL | Status: DC

## 2012-02-01 MED ORDER — PEG-KCL-NACL-NASULF-NA ASC-C 100 G PO SOLR: 1.0000 | Freq: Once | ORAL | Status: DC

## 2012-02-01 NOTE — Progress Notes (Signed)
History of Present Illness:  This is a pleasant 71 year old Caucasian female who has chronic IBS, previously diarrhea predominant, now constipation predominant. She's had recent left lower quadrant pain several weeks ago with mild constipation, she currently is much improved. The patient relates her pain related to eating a large volume of nuts. Last colonoscopy was over 3 years ago was unremarkable. She has several family members with colon polyps. This of note that the patient was on one month of clindamycin in June of this year because of dental problems. She is status post knee replacement 2 years ago and developed iron deficiency anemia after that surgery. She's not on iron therapy at this time, and denies symptoms of anemia. Patient also has chronic acid reflux partly responsive to Prilosec 20 mg a day. She does report progressive solid food dysphagia at this time. The patient denies hepatobiliary or other general medical problems.  I have reviewed this patient's present history, medical and surgical past history, allergies and medications.     ROS: The remainder of the 10 point ROS is negative     Physical Exam: Blood pressure 120/64, pulse 72 and regular, and weight 187 pounds with a BMI of 28.86. General well developed well nourished patient in no acute distress, appearing their stated age Eyes PERRLA, no icterus, fundoscopic exam per opthamologist Skin no lesions noted Neck supple, no adenopathy, no thyroid enlargement, no tenderness Chest clear to percussion and auscultation Heart no significant murmurs, gallops or rubs noted Abdomen no hepatosplenomegaly masses or tenderness, BS normal.  Rectal inspection normal no fissures, or fistulae noted.  No masses or tenderness on digital exam. Stool guaiac positive. Extremities no acute joint lesions, edema, phlebitis or evidence of cellulitis. Neurologic patient oriented x 3, cranial nerves intact, no focal neurologic deficits  noted. Psychological mental status normal and normal affect.  Assessment and plan: Chronic constipation and diverticulosis coli with possible recent subacute diverticulitis. I'm concerned about her guaiac positive stool and have scheduled her for followup colonoscopy exam. I've advised I high-fiber diet, daily Metamucil, liberal by mouth fluids, and a trial of Linzess 145 mcg a day. Because of her continued acid reflux symptoms we will repeat her endoscopic exam with possible dilation, and have changed her from Prilosec to Dexilant 60 mg a day. Followup labs and anemia profile ordered.  Encounter Diagnoses  Name Primary?  . Heme positive stool Yes  . GERD (gastroesophageal reflux disease)   . Other general symptoms   . Abdominal  pain, other specified site   . Chest pain   . Hypothyroidism   . Abnormal CBC

## 2012-02-01 NOTE — Patient Instructions (Addendum)
You have been scheduled for an endoscopy and colonoscopy with propofol. Please follow the written instructions given to you at your visit today. Please pick up your prep at the pharmacy within the next 1-3 days. If you use inhalers (even only as needed), please bring them with you on the day of your procedure. Your physician has requested that you go to the basement for the following lab work before leaving today: We have sent the following medications to your pharmacy for you to pick up at your convenience: Linzess and Dexilant High Fiber Diet A high fiber diet changes your normal diet to include more whole grains, legumes, fruits, and vegetables. Changes in the diet involve replacing refined carbohydrates with unrefined foods. The calorie level of the diet is essentially unchanged. The Dietary Reference Intake (recommended amount) for adult males is 38 g per day. For adult females, it is 25 g per day. Pregnant and lactating women should consume 28 g of fiber per day. Fiber is the intact part of a plant that is not broken down during digestion. Functional fiber is fiber that has been isolated from the plant to provide a beneficial effect in the body. PURPOSE  Increase stool bulk.   Ease and regulate bowel movements.   Lower cholesterol.  INDICATIONS THAT YOU NEED MORE FIBER  Constipation and hemorrhoids.   Uncomplicated diverticulosis (intestine condition) and irritable bowel syndrome.   Weight management.   CC: Merlene Laughter, M.D.  As a protective measure against hardening of the arteries (atherosclerosis), diabetes, and cancer.  NOTE OF CAUTION If you have a digestive or bowel problem, ask your caregiver for advice before adding high fiber foods to your diet. Some of the following medical problems are such that a high fiber diet should not be used without consulting your caregiver:  Acute diverticulitis (intestine infection).   Partial small bowel obstructions.   Complicated  diverticular disease involving bleeding, rupture (perforation), or abscess (boil, furuncle).   Presence of autonomic neuropathy (nerve damage) or gastric paresis (stomach cannot empty itself).  GUIDELINES FOR INCREASING FIBER  Start adding fiber to the diet slowly. A gradual increase of about 5 more grams (2 slices of whole-wheat bread, 2 servings of most fruits or vegetables, or 1 bowl of high fiber cereal) per day is best. Too rapid an increase in fiber may result in constipation, flatulence, and bloating.   Drink enough water and fluids to keep your urine clear or pale yellow. Water, juice, or caffeine-free drinks are recommended. Not drinking enough fluid may cause constipation.   Eat a variety of high fiber foods rather than one type of fiber.   Try to increase your intake of fiber through using high fiber foods rather than fiber pills or supplements that contain small amounts of fiber.   The goal is to change the types of food eaten. Do not supplement your present diet with high fiber foods, but replace foods in your present diet.  INCLUDE A VARIETY OF FIBER SOURCES  Replace refined and processed grains with whole grains, canned fruits with fresh fruits, and incorporate other fiber sources. White rice, white breads, and most bakery goods contain little or no fiber.   Brown whole-grain rice, buckwheat oats, and many fruits and vegetables are all good sources of fiber. These include: broccoli, Brussels sprouts, cabbage, cauliflower, beets, sweet potatoes, white potatoes (skin on), carrots, tomatoes, eggplant, squash, berries, fresh fruits, and dried fruits.   Cereals appear to be the richest source of fiber. Cereal fiber is  found in whole grains and bran. Bran is the fiber-rich outer coat of cereal grain, which is largely removed in refining. In whole-grain cereals, the bran remains. In breakfast cereals, the largest amount of fiber is found in those with "bran" in their names. The fiber  content is sometimes indicated on the label.   You may need to include additional fruits and vegetables each day.   In baking, for 1 cup white flour, you may use the following substitutions:   1 cup whole-wheat flour minus 2 tbs.    cup white flour plus  cup whole-wheat flour.  Document Released: 05/17/2005 Document Revised: 05/06/2011 Document Reviewed: 03/25/2009 [facility] Patient Information 2012 [place], [place].

## 2012-02-02 ENCOUNTER — Telehealth: Payer: Self-pay | Admitting: Gastroenterology

## 2012-02-02 DIAGNOSIS — R195 Other fecal abnormalities: Secondary | ICD-10-CM

## 2012-02-02 DIAGNOSIS — E039 Hypothyroidism, unspecified: Secondary | ICD-10-CM

## 2012-02-02 DIAGNOSIS — R079 Chest pain, unspecified: Secondary | ICD-10-CM

## 2012-02-02 DIAGNOSIS — R6889 Other general symptoms and signs: Secondary | ICD-10-CM

## 2012-02-02 DIAGNOSIS — K219 Gastro-esophageal reflux disease without esophagitis: Secondary | ICD-10-CM

## 2012-02-02 LAB — FOLATE: Folate: >24.8 ng/mL (ref >5.9)

## 2012-02-02 MED ORDER — DEXLANSOPRAZOLE 60 MG PO CPDR: 60.0000 mg | DELAYED_RELEASE_CAPSULE | Freq: Every day | ORAL | Status: DC

## 2012-02-02 NOTE — Telephone Encounter (Signed)
Opened in error

## 2012-02-02 NOTE — Telephone Encounter (Signed)
Ann from [employer] approved Dexilant 60 mg. through 02-01-13. I notified Viviann Spare at [facility] and he reprocessed her prescription.

## 2012-02-02 NOTE — Telephone Encounter (Signed)
Pt had several questions about her meds and upcoming ECL. I left her samples of Dexilant because she states she will need prior auth. Explained she may take Linzess any time of day, but warned her she may have a stool within a couple of hours; she will have to learn her schedule. Also informed her she amy have one stool or numerous and some pts c/o a diarrhea like effect, but I think it's a large loose explosive stool rather that several watery ones. She also asked why she isn't have an U/S prior to the [facility]. Per her notes, in 2010, she had an U/S to ck for gall bladder problems. Pt stated understanding.

## 2012-02-02 NOTE — Telephone Encounter (Signed)
Informed pt of her lab results. Pt was at Dr Laverle Hobby [facility] and he had received the TSH results. Pt will call me later, she just wanted to know if she was anemic.

## 2012-02-02 NOTE — Telephone Encounter (Signed)
Informed pt of labs and mailed her a copy; pt stated understanding with results.

## 2012-02-03 ENCOUNTER — Telehealth: Payer: Self-pay

## 2012-02-03 NOTE — Telephone Encounter (Signed)
Patient wants to know why Dr. Jarold Motto did an ultrasound prior to 2010 colonoscopy, but did not order one before scheduled procedures on 02-07-12. Also wants to know if she needs stop aspirin. Told patient Dr. Jarold Motto will be able to get the information he needs to help her by doing the 02-07-12 procedures and doing another ultrasound was not needed. Told her also no need to stop her aspirin prior to her procedure.

## 2012-02-07 ENCOUNTER — Encounter: Payer: Self-pay | Admitting: Gastroenterology

## 2012-02-07 ENCOUNTER — Ambulatory Visit (AMBULATORY_SURGERY_CENTER): Payer: Medicare Other | Admitting: Gastroenterology

## 2012-02-07 VITALS — BP 138/71 | HR 77 | Temp 98.9°F | Resp 18 | Ht 67.5 in | Wt 187.0 lb

## 2012-02-07 DIAGNOSIS — K449 Diaphragmatic hernia without obstruction or gangrene: Secondary | ICD-10-CM

## 2012-02-07 DIAGNOSIS — R195 Other fecal abnormalities: Secondary | ICD-10-CM

## 2012-02-07 DIAGNOSIS — K2100 Gastro-esophageal reflux disease with esophagitis, without bleeding: Secondary | ICD-10-CM

## 2012-02-07 DIAGNOSIS — K219 Gastro-esophageal reflux disease without esophagitis: Secondary | ICD-10-CM

## 2012-02-07 DIAGNOSIS — Z1211 Encounter for screening for malignant neoplasm of colon: Secondary | ICD-10-CM

## 2012-02-07 DIAGNOSIS — K222 Esophageal obstruction: Secondary | ICD-10-CM

## 2012-02-07 DIAGNOSIS — R079 Chest pain, unspecified: Secondary | ICD-10-CM

## 2012-02-07 MED ORDER — SODIUM CHLORIDE 0.9 % IV SOLN: 500.0000 mL | INTRAVENOUS | Status: DC

## 2012-02-07 NOTE — Op Note (Signed)
[facility] [address] [place], [address]   ENDOSCOPY PROCEDURE REPORT  PATIENT: Kimberly, [name]  MR#: [id number] BIRTHDATE: 1940/10/03 , 70  yrs. old GENDER: Female ENDOSCOPIST:David Hale Bogus, MD, [facility] REFERRED BY: PROCEDURE DATE:  02/07/2012 PROCEDURE:   EGD w/ biopsy ASA CLASS:    Class II INDICATIONS: dysphagia and chest pain. MEDICATION: There was residual sedation effect present from prior procedure and propofol (Diprivan) 200mg  IV TOPICAL ANESTHETIC:  DESCRIPTION OF PROCEDURE:   After the risks and benefits of the procedure were explained, informed consent was obtained.  The [facility] GIF-H180 [id number]  endoscope was introduced through the mouth  and advanced to the second portion of the duodenum .  The instrument was slowly withdrawn as the mucosa was fully examined.    The upper, middle and distal third of the esophagus were carefully inspected and no abnormalities were noted.  The z-line was well seen at the GEJ.  The endoscope was pushed into the fundus which was normal including a retroflexed view.  The antrum, gastric body, first and second part of the duodenum were unremarkable. A moderate sizef HH noted,biopsies done,then dilated #80F Maloney dilator. Retroflexed views revealed a hiatal hernia.    The scope was then withdrawn from the patient and the procedure completed.  COMPLICATIONS: There were no complications.   ENDOSCOPIC IMPRESSION:chest pain ...Marland Kitchen??? related to GERD... Normal EGD  RECOMMENDATIONS:continue meds,needs manometry and pH probe if not done....    _______________________________ eSignedMardella Layman, MD, [facility] 02/07/2012 2:21 PM   standard discharge  cc: Hal Stoneking,MD

## 2012-02-07 NOTE — Op Note (Addendum)
[name] [address] [place], [address]   COLONOSCOPY PROCEDURE REPORT  PATIENT: Kimberly [name], Kimberly [name]  MR#: [id number] BIRTHDATE: 1941-04-03 , 70  yrs. old GENDER: Female ENDOSCOPIST: Mardella Layman, MD, [facility] REFERRED BY: PROCEDURE DATE:  02/07/2012 PROCEDURE:   Colonoscopy, screening ASA CLASS:   Class II INDICATIONS:constipation and + STOOL GUAIAC. MEDICATIONS: propofol (Diprivan) 300mg  IV  DESCRIPTION OF PROCEDURE:   After the risks and benefits and of the procedure were explained, informed consent was obtained.  A digital rectal exam revealed no abnormalities of the rectum.    The [facility] [id number]  endoscope was introduced through the anus and advanced to the cecum, which was identified by both the appendix and ileocecal valve .  The quality of the prep was excellent, using MoviPrep .  The instrument was then slowly withdrawn as the colon was fully examined.   COLON FINDINGS: The colonic mucosa appeared normal.   The colon was otherwise normal.  There was no diverticulosis, inflamation, polyps or cancers unless previously stated.     Retroflexed views revealed no abnormalities.     The scope was then withdrawn from the patient and the procedure completed.  COMPLICATIONS: There were no complications. ENDOSCOPIC IMPRESSION: Normal colonoscopy RECOMMENDATIONS: 1.  continue current medications 2.  High fiber diet 3.  Continue current colorectal screening recommendations for "routine risk" patients with a repeat colonoscopy in 10 years.  REPEAT EXAM:  cc:  _______________________________ eSignedMardella Layman, MD, [facility] 02/07/2012 2:11 PM Revised: 02/07/2012 2:11 PM

## 2012-02-07 NOTE — Progress Notes (Signed)
Pt has her albuterol inhaler at the bedside. Maw

## 2012-02-07 NOTE — Patient Instructions (Addendum)
YOU HAD AN ENDOSCOPIC PROCEDURE TODAY AT THE  ENDOSCOPY CENTER: Refer to the procedure report that was given to you for any specific questions about what was found during the examination.  If the procedure report does not answer your questions, please call your gastroenterologist to clarify.  If you requested that your care partner not be given the details of your procedure findings, then the procedure report has been included in a sealed envelope for you to review at your convenience later.  YOU SHOULD EXPECT: Some feelings of bloating in the abdomen. Passage of more gas than usual.  Walking can help get rid of the air that was put into your GI tract during the procedure and reduce the bloating. If you had a lower endoscopy (such as a colonoscopy or flexible sigmoidoscopy) you may notice spotting of blood in your stool or on the toilet paper. If you underwent a bowel prep for your procedure, then you may not have a normal bowel movement for a few days.  DIET: You may have water in 1 hr and stay of a SOFT DIET for the rest of today due to your dilitation.  Heavy or fried foods are harder to digest and may make you feel nauseous or bloated.  Likewise meals heavy in dairy and vegetables can cause extra gas to form and this can also increase the bloating.  Drink plenty of fluids but you should avoid alcoholic beverages for 24 hours.  ACTIVITY: Your care partner should take you home directly after the procedure.  You should plan to take it easy, moving slowly for the rest of the day.  You can resume normal activity the day after the procedure however you should NOT DRIVE or use heavy machinery for 24 hours (because of the sedation medicines used during the test).    SYMPTOMS TO REPORT IMMEDIATELY: A gastroenterologist can be reached at any hour.  During normal business hours, 8:30 AM to 5:00 PM Monday through Friday, call [phone].  After hours and on weekends, please call the GI answering service  at [phone] who will take a message and have the physician on call contact you.   Following lower endoscopy (colonoscopy or flexible sigmoidoscopy):  Excessive amounts of blood in the stool  Significant tenderness or worsening of abdominal pains  Swelling of the abdomen that is new, acute  Fever of 100F or higher  Following upper endoscopy (EGD)  Vomiting of blood or coffee ground material  New chest pain or pain under the shoulder blades  Painful or persistently difficult swallowing  New shortness of breath  Fever of 100F or higher  Black, tarry-looking stools  FOLLOW UP: If any biopsies were taken you will be contacted by phone or by letter within the next 1-3 weeks.  Call your gastroenterologist if you have not heard about the biopsies in 3 weeks.  Our staff will call the home number listed on your records the next business day following your procedure to check on you and address any questions or concerns that you may have at that time regarding the information given to you following your procedure. This is a courtesy call and so if there is no answer at the home number and we have not heard from you through the emergency physician on call, we will assume that you have returned to your regular daily activities without incident.  SIGNATURES/CONFIDENTIALITY: You and/or your care partner have signed paperwork which will be entered into your electronic medical record.  These  signatures attest to the fact that that the information above on your After Visit Summary has been reviewed and is understood.  Full responsibility of the confidentiality of this discharge information lies with you and/or your care-partner.   Thank-you for choosing [place] for your medical needs.

## 2012-02-07 NOTE — Progress Notes (Addendum)
Patient did not have preoperative order for IV antibiotic SSI prophylaxis. (G8918)  Patient did not experience any of the following events: a burn prior to discharge; a fall within the facility; wrong site/side/patient/procedure/implant event; or a hospital transfer or hospital admission upon discharge from the facility. (G8907)  

## 2012-02-08 ENCOUNTER — Other Ambulatory Visit: Payer: Self-pay | Admitting: *Deleted

## 2012-02-08 ENCOUNTER — Telehealth: Payer: Self-pay

## 2012-02-08 ENCOUNTER — Telehealth: Payer: Self-pay | Admitting: *Deleted

## 2012-02-08 DIAGNOSIS — R079 Chest pain, unspecified: Secondary | ICD-10-CM

## 2012-02-08 DIAGNOSIS — E039 Hypothyroidism, unspecified: Secondary | ICD-10-CM

## 2012-02-08 DIAGNOSIS — R6889 Other general symptoms and signs: Secondary | ICD-10-CM

## 2012-02-08 DIAGNOSIS — R195 Other fecal abnormalities: Secondary | ICD-10-CM

## 2012-02-08 DIAGNOSIS — K219 Gastro-esophageal reflux disease without esophagitis: Secondary | ICD-10-CM

## 2012-02-08 MED ORDER — DEXLANSOPRAZOLE 60 MG PO CPDR: 60.0000 mg | DELAYED_RELEASE_CAPSULE | Freq: Every day | ORAL | Status: DC

## 2012-02-08 NOTE — Telephone Encounter (Signed)
Pt walked into [facility] during lunch stating someone was supposed to leave Dexilant for her. Could not find the meds and there were no notes in [place]. Gave pt samples and we spoke of her procedures yesterday and that Dr Jarold Motto wants her to have an EM and PH probe if bx don't reveal anything. Pt was given pamphlets on EM and Ph probe, HH, GERD and will called for questions.

## 2012-02-08 NOTE — Telephone Encounter (Signed)
Left message on answering machine. 

## 2012-02-10 ENCOUNTER — Telehealth: Payer: Self-pay | Admitting: Gastroenterology

## 2012-02-10 NOTE — Telephone Encounter (Signed)
Pt asked about her path results which are still not back. She had questions about Linzess on how long she can take it, when to take it and those questions were answered. She reports her swallowing is much better and she is taking the Dexilant.

## 2012-02-11 ENCOUNTER — Telehealth: Payer: Self-pay | Admitting: *Deleted

## 2012-02-11 ENCOUNTER — Encounter: Payer: Self-pay | Admitting: Gastroenterology

## 2012-02-11 NOTE — Telephone Encounter (Signed)
Pt called for results of her path report. Informed pt of path and she stated understanding. She does not want to do the EM or PH and so I advised her to try the Dexilant, give it a couple of weeks and see if the Esophagitis/inflammation is helped. She will call if symptoms are no better.

## 2012-02-16 ENCOUNTER — Telehealth: Payer: Self-pay | Admitting: Gastroenterology

## 2012-02-16 NOTE — Telephone Encounter (Signed)
I transferred Pt to Medical Records.

## 2012-03-01 ENCOUNTER — Telehealth: Payer: Self-pay | Admitting: Gastroenterology

## 2012-03-01 NOTE — Telephone Encounter (Signed)
Pt states she's doing much better with her GERD/Swallowing and her constipation. She wants to come off the Dexilant and go back on Omeprazole. She will try QOD and the every 2 days in an attempt to switch. She is also doing well with Linzess prn and Metamucil and Miralax everyday; she will try to use the Linzess very rarely only PRN. We discussed the SE with both Dexilant and Linzess which is another reason to stop the drugs.  She did have an episode of blood in her stool Monday PM after several BMs with Linzess. She states she does have hemorrhoids and maybe that's the cause of the bleeding. With frequent stools, she will try to use moist towelettes . She will call back for problems.

## 2012-03-01 NOTE — Telephone Encounter (Signed)
At this time, she will not have the Esophageal Manometry and PH since her swallowing is better.

## 2012-03-10 ENCOUNTER — Telehealth: Payer: Self-pay | Admitting: Gastroenterology

## 2012-03-10 NOTE — Telephone Encounter (Signed)
Pt aware and states she will not take it then and use the topical medication that the dermatologist mentioned.

## 2012-03-10 NOTE — Telephone Encounter (Signed)
Pt states that she has a little "acne like" rash on her chin. Dr. [place] wants her to take Doxycycline 100mg  daily for 8 weeks. Pt states her esophagus is feeling better since her procedure with Dr. Jarold Motto and she doesn't want to mess anything up. Pt wants to know if Dr. Jarold Motto thinks she would be ok taking the Doxycycline. Please advise.

## 2012-03-10 NOTE — Telephone Encounter (Signed)
Ok with 8ozs of fluid to get these pills down,,,would not unless essential

## 2012-03-23 ENCOUNTER — Telehealth: Payer: Self-pay | Admitting: Gastroenterology

## 2012-03-23 NOTE — Telephone Encounter (Signed)
Pt reports she has weaned herself off Dexilant and is taking Omeprazole daily and so far, is doing well. She saw her eye doc about a spot outside her L eye and has an ointment to use and she may have to go on Doxycycline. She reports Dr Jarold Motto has said in the past she should not take it, but if she has to what should she do/ explained we usually have pt take probiotics with strong GI upsetting antibiotics. She reports Align was so expensive, she stopped taking it. Left her samples of Align and [employer].

## 2012-03-27 ENCOUNTER — Telehealth: Payer: Self-pay | Admitting: Gastroenterology

## 2012-03-27 MED ORDER — SACCHAROMYCES BOULARDII 250 MG PO CAPS: 250.0000 mg | ORAL_CAPSULE | Freq: Two times a day (BID) | ORAL | Status: DC

## 2012-03-27 NOTE — Telephone Encounter (Signed)
Doxycycline should not be a problem with the gut,florstar also is ok

## 2012-03-27 NOTE — Telephone Encounter (Signed)
Pt reports her L eye problem is not getting better. She was on drops and now an ointment w/o much improvement. Her eye doc has already told her she may need Doxycycline to clear up this problem and the pt is afraid of GI problems with the drug. Pt will call to ask for the least possible dose of doxy and I ordered Florastor for her to take if needed. Pt stated understanding.

## 2012-03-27 NOTE — Telephone Encounter (Signed)
Informed pt Dr Jarold Motto stated she should be OK with  Doxycycline; pt stated understanding.

## 2012-04-04 ENCOUNTER — Telehealth: Payer: Self-pay | Admitting: *Deleted

## 2012-04-04 NOTE — Telephone Encounter (Signed)
Did prior authorization for Omeprazole called [phone] and spoke with Bill.  Omeprazole was approved 02-2012-09-2012. Case # is [id number]  Called pharmacy and advised RX for Omeprazole was approved.  Patient was notified as well.  Fax copy is being sent as well stating approval.

## 2012-05-08 ENCOUNTER — Telehealth: Payer: Self-pay | Admitting: Gastroenterology

## 2012-05-08 NOTE — Telephone Encounter (Signed)
Pt reports she has GI upset after eating chocolate and sausage. She wants to know if she can take Dexilant QOD until she gets straightened out; informed her she can do this and if she needs [place], she can call. Pt stated understanding.

## 2012-05-31 HISTORY — PX: BUNIONECTOMY: SHX129

## 2012-07-05 ENCOUNTER — Other Ambulatory Visit: Payer: Self-pay | Admitting: Gastroenterology

## 2012-07-21 ENCOUNTER — Ambulatory Visit (INDEPENDENT_AMBULATORY_CARE_PROVIDER_SITE_OTHER): Payer: Medicare Other | Admitting: Women's Health

## 2012-07-21 ENCOUNTER — Encounter: Payer: Self-pay | Admitting: Women's Health

## 2012-07-21 DIAGNOSIS — N898 Other specified noninflammatory disorders of vagina: Secondary | ICD-10-CM

## 2012-07-21 DIAGNOSIS — N899 Noninflammatory disorder of vagina, unspecified: Secondary | ICD-10-CM

## 2012-07-21 LAB — WET PREP FOR TRICH, YEAST, CLUE
Clue Cells Wet Prep HPF POC: NONE SEEN
Trich, Wet Prep: NONE SEEN
WBC, Wet Prep HPF POC: NONE SEEN
Yeast Wet Prep HPF POC: NONE SEEN

## 2012-07-21 MED ORDER — NYSTATIN 100000 UNIT/GM EX OINT: TOPICAL_OINTMENT | CUTANEOUS | Status: DC | PRN

## 2012-07-21 NOTE — Progress Notes (Signed)
Patient ID: Kimberly [name], female   DOB: [birth year], 72 y.o.   MRN: [id number] Presents with white discharge with external vaginal itching and irritation. Has been using Terazol cream, uses a mini pad daily for occasional leakage of urine. Denies abdominal pain, fever or urinary symptoms. Not sexually active, husbands health. TVH BSO  Exam: Appears well. External genitalia erythematous on labia majora, minimal erythema at introitus. Wet prep negative.  ExternalVaginal irritation with itching.  Plan: Stop Terazol cream, will try nystatin ointment to see if that helps with itching symptoms. Avoid minipads as best she can, loose clothes. Instructed to call if symptoms persist.

## 2012-08-17 ENCOUNTER — Encounter: Payer: Medicare Other | Admitting: Gynecology

## 2012-08-23 ENCOUNTER — Encounter: Payer: Medicare Other | Admitting: Gynecology

## 2012-09-12 ENCOUNTER — Ambulatory Visit (INDEPENDENT_AMBULATORY_CARE_PROVIDER_SITE_OTHER): Payer: Medicare Other | Admitting: Gynecology

## 2012-09-12 ENCOUNTER — Encounter: Payer: Self-pay | Admitting: Gynecology

## 2012-09-12 VITALS — BP 120/78 | Ht 67.0 in | Wt 180.0 lb

## 2012-09-12 DIAGNOSIS — N952 Postmenopausal atrophic vaginitis: Secondary | ICD-10-CM

## 2012-09-12 DIAGNOSIS — M949 Disorder of cartilage, unspecified: Secondary | ICD-10-CM

## 2012-09-12 DIAGNOSIS — M8580 Other specified disorders of bone density and structure, unspecified site: Secondary | ICD-10-CM

## 2012-09-12 DIAGNOSIS — M899 Disorder of bone, unspecified: Secondary | ICD-10-CM

## 2012-09-12 NOTE — Progress Notes (Signed)
LENNI RECKNER [birth year] [id number]        72 y.o.  G0P0 for followup exam.  Former patient of Dr Eda Paschal. Several issues noted below.  Past medical history,surgical history, medications, allergies, family history and social history were all reviewed and documented in the EPIC chart. ROS:  Was performed and pertinent positives and negatives are included in the history.  Exam: Kim assistant Filed Vitals:   09/12/12 0949  BP: 120/78  Height: 5\' 7"  (1.702 m)  Weight: 180 lb (81.647 kg)   General appearance  Normal Skin grossly normal Head/Neck normal with no cervical or supraclavicular adenopathy thyroid normal Lungs  clear Cardiac RR, without RMG Abdominal  soft, nontender, without masses, organomegaly or hernia Breasts  examined lying and sitting without masses, retractions, discharge or axillary adenopathy. Pelvic  Ext/BUS/vagina  normal with atrophic genital changes  Adnexa  Without masses or tenderness    Anus and perineum  normal   Rectovaginal  normal sphincter tone without palpated masses or tenderness.    Assessment/Plan:  72 y.o. G0P0 female for followup exam.   1. Atrophic vaginitis. As discussed with Dr Eda Paschal in the past. Last year had prescribed Estrace cream but she felt uncomfortable using this with a history of stroke. I reviewed the issues of ERT and the risks of stroke heart attack DVT breast cancer issues. Limited absorption with vaginal route such as vaginal cream or Vagifem.  Osphena also reviewed. After a lengthy discussion the patient is very uncomfortable considering the use of any estrogen product and would prefer just to monitor. 2. Osteopenia. DEXA 09/2011 shows T score -1.3 FRAX 15%/1.8% increase calcium vitamin D reviewed. I asked her to have a vitamin D level drawn the next time that her primary physician does blood work. Followup DEXA in 1-2 years. 3. History of TAH/BSO for leiomyomata. 4. Mammography 12/2011. Continue with annual mammography. SBE  monthly reviewed. 5. Pap smear 2011. No Pap smear done today. I reviewed current screening guidelines. She is status post hysterectomy for benign indications and over the age of 47. We both agree on stop screening and she is comfortable with this. 6. Colonoscopy 01/2012. Repeated there recommended interval. 7. Health maintenance. No lab work done as it is all done through her primary physician's office. Followup one year, sooner as needed.    Dara Lords MD, 10:39 AM 09/12/2012

## 2012-09-12 NOTE — Patient Instructions (Signed)
Follow up in one year, sooner as needed. 

## 2012-09-20 DIAGNOSIS — M205X9 Other deformities of toe(s) (acquired), unspecified foot: Secondary | ICD-10-CM | POA: Insufficient documentation

## 2012-10-02 ENCOUNTER — Telehealth: Payer: Self-pay | Admitting: *Deleted

## 2012-10-02 NOTE — Telephone Encounter (Signed)
Left the below note on pt voicemail. 

## 2012-10-02 NOTE — Telephone Encounter (Signed)
That should be plenty. I again reminded her that next time her primary physician does lab work to make sure he checks her vitamin D level.

## 2012-10-02 NOTE — Telephone Encounter (Signed)
Pt would like to know if she is taking enough calcium she takes 2  chewable tablets which has 500 mg calcium & 1,000 units Vit. D daily? She asked if she will need to take Vit. D 3 as well, she takes this only 1 pill but not on a daily basis along with Vit. B 12 1000 mcg. Please advise

## 2012-10-11 DIAGNOSIS — H43819 Vitreous degeneration, unspecified eye: Secondary | ICD-10-CM | POA: Insufficient documentation

## 2012-10-11 DIAGNOSIS — H04123 Dry eye syndrome of bilateral lacrimal glands: Secondary | ICD-10-CM | POA: Insufficient documentation

## 2012-10-11 DIAGNOSIS — H26499 Other secondary cataract, unspecified eye: Secondary | ICD-10-CM | POA: Insufficient documentation

## 2012-10-11 DIAGNOSIS — H01009 Unspecified blepharitis unspecified eye, unspecified eyelid: Secondary | ICD-10-CM | POA: Insufficient documentation

## 2012-10-16 ENCOUNTER — Telehealth: Payer: Self-pay | Admitting: Gastroenterology

## 2012-10-16 NOTE — Telephone Encounter (Signed)
Pt with a hx of IBS alternating between constipation and diarrhea called to c/o increased problems in the past 2-3 weeks. She has been doing well, but has had increased stress in her life. Last COLON 01/2012 was normal, previous one in 09/18/08 showed scattered diverticuli and hemorrhoids. Pt reports recently she has increased her consumption of nuts and has had moore constipation and she has noticed blood on the toilet tissue x 2 as well as gas and belching. To r/o her diet and constipation causing the hemorrhoidal bleeding, can we order Anusol HC? Thanks.

## 2012-10-17 NOTE — Telephone Encounter (Signed)
Needs ov

## 2012-10-17 NOTE — Telephone Encounter (Signed)
Scheduled pt to see Dr Jarold Motto on 10/24/12. I will call if there is a cancellation.

## 2012-10-24 ENCOUNTER — Ambulatory Visit (INDEPENDENT_AMBULATORY_CARE_PROVIDER_SITE_OTHER): Payer: Medicare Other | Admitting: Gastroenterology

## 2012-10-24 ENCOUNTER — Encounter: Payer: Self-pay | Admitting: Gastroenterology

## 2012-10-24 VITALS — BP 134/80 | HR 70 | Ht 67.0 in | Wt 181.0 lb

## 2012-10-24 DIAGNOSIS — K5900 Constipation, unspecified: Secondary | ICD-10-CM

## 2012-10-24 DIAGNOSIS — K625 Hemorrhage of anus and rectum: Secondary | ICD-10-CM

## 2012-10-24 MED ORDER — LINACLOTIDE 145 MCG PO CAPS: 145.0000 ug | ORAL_CAPSULE | Freq: Every day | ORAL | Status: DC

## 2012-10-24 MED ORDER — HYDROCORTISONE ACETATE 25 MG RE SUPP: 25.0000 mg | Freq: Every day | RECTAL | Status: DC

## 2012-10-24 NOTE — Patient Instructions (Signed)
We have sent the following medications to your pharmacy for you to pick up at your convenience: Anusol Suppositories, use one suppositories at bedtime Linzess 145 mcg, please take one capsule by mouth once daily  Fiber Supplement Sheet given today. Please start taking Benefiber twice daily.  Please call Dr. Norval Gable nurse Aram Beecham in two weeks with a progress on how you are doing.  ______________________________________________________________________________________________________________________  Hemorrhoids Hemorrhoids are swollen veins around the rectum or anus. There are two types of hemorrhoids:   Internal hemorrhoids. These occur in the veins just inside the rectum. They may poke through to the outside and become irritated and painful.  External hemorrhoids. These occur in the veins outside the anus and can be felt as a painful swelling or hard lump near the anus. CAUSES  Pregnancy.   Obesity.   Constipation or diarrhea.   Straining to have a bowel movement.   Sitting for long periods on the toilet.  Heavy lifting or other activity that caused you to strain.  Anal intercourse. SYMPTOMS   Pain.   Anal itching or irritation.   Rectal bleeding.   Fecal leakage.   Anal swelling.   One or more lumps around the anus.  DIAGNOSIS  Your caregiver may be able to diagnose hemorrhoids by visual examination. Other examinations or tests that may be performed include:   Examination of the rectal area with a gloved hand (digital rectal exam).   Examination of anal canal using a small tube (scope).   A blood test if you have lost a significant amount of blood.  A test to look inside the colon (sigmoidoscopy or colonoscopy). TREATMENT Most hemorrhoids can be treated at home. However, if symptoms do not seem to be getting better or if you have a lot of rectal bleeding, your caregiver may perform a procedure to help make the hemorrhoids get smaller or remove  them completely. Possible treatments include:   Placing a rubber band at the base of the hemorrhoid to cut off the circulation (rubber band ligation).   Injecting a chemical to shrink the hemorrhoid (sclerotherapy).   Using a tool to burn the hemorrhoid (infrared light therapy).   Surgically removing the hemorrhoid (hemorrhoidectomy).   Stapling the hemorrhoid to block blood flow to the tissue (hemorrhoid stapling).  HOME CARE INSTRUCTIONS   Eat foods with fiber, such as whole grains, beans, nuts, fruits, and vegetables. Ask your doctor about taking products with added fiber in them (fibersupplements).  Increase fluid intake. Drink enough water and fluids to keep your urine clear or pale yellow.   Exercise regularly.   Go to the bathroom when you have the urge to have a bowel movement. Do not wait.   Avoid straining to have bowel movements.   Keep the anal area dry and clean. Use wet toilet paper or moist towelettes after a bowel movement.   Medicated creams and suppositories may be used or applied as directed.   Only take over-the-counter or prescription medicines as directed by your caregiver.   Take warm sitz baths for 15 20 minutes, 3 4 times a day to ease pain and discomfort.   Place ice packs on the hemorrhoids if they are tender and swollen. Using ice packs between sitz baths may be helpful.   Put ice in a plastic bag.   Place a towel between your skin and the bag.   Leave the ice on for 15 20 minutes, 3 4 times a day.   Do not use a donut-shaped  pillow or sit on the toilet for long periods. This increases blood pooling and pain.  SEEK MEDICAL CARE IF:  You have increasing pain and swelling that is not controlled by treatment or medicine.  You have uncontrolled bleeding.  You have difficulty or you are unable to have a bowel movement.  You have pain or inflammation outside the area of the hemorrhoids. MAKE SURE YOU:  Understand these  instructions.  Will watch your condition.  Will get help right away if you are not doing well or get worse. Document Released: 05/14/2000 Document Revised: 05/03/2012 Document Reviewed: 03/21/2012 ExitCare Patient Information 2014 [employer]. ________________________________________________________________________________                                               We are excited to introduce MyChart, a new best-in-class service that provides you online access to important information in your electronic medical record. We want to make it easier for you to view your health information - all in one secure location - when and where you need it. We expect MyChart will enhance the quality of care and service we provide.  When you register for MyChart, you can:    View your test results.    Request appointments and receive appointment reminders via email.    Request medication renewals.    View your medical history, allergies, medications and immunizations.    Communicate with your physician's office through a password-protected site.    Conveniently print information such as your medication lists.  To find out if MyChart is right for you, please talk to a member of our clinical staff today. We will gladly answer your questions about this free health and wellness tool.  If you are age 72 or older and want a member of your family to have access to your record, you must provide written consent by completing a proxy form available at our office. Please speak to our clinical staff about guidelines regarding accounts for patients younger than age 29.  As you activate your MyChart account and need any technical assistance, please call the MyChart technical support line at [phone] 5065092138) or email your question to [email or web address]. If you email your question(s), please include your name, a return phone number and the best time to reach you.  If you have non-urgent  health-related questions, you can send a message to our office through MyChart at [email or web address]. If you have a medical emergency, call 911.  Thank you for using MyChart as your new health and wellness resource!   MyChart licensed from [employer],  4540-9811. Patents Pending.

## 2012-10-24 NOTE — Progress Notes (Signed)
This is a 72 year old Caucasian female with intermittent bright red blood per rectum associated with constipation and straining.  She denies abdominal pain, and any other upper or lower GI complaints.  She had colonoscopy and endoscopy within the last year which were unremarkable.  He currently is on probiotic therapy, when necessary MiraLax, and had good response previously to Linzess 45 mcg a day.  She does take Prilosec 20 mg a day for GERD.  Her appetite is good her weight is stable and she tries to eat a high fiber foods and liberal by mouth fluids.  She denies any systemic complaints.  Current Medications, Allergies, Past Medical History, Past Surgical History, Family History and Social History were reviewed in [employer] record.  ROS: All systems were reviewed and are negative unless otherwise stated in the HPI.          Physical Exam: Healthy appearing patient in no distress blood pressure 134/80, pulse 70 and regular and weight 181 with a BMI of 28.34.  Her abdomen is flat and nontender without organomegaly, and bowel sounds are normal.  Inspection the rectum shows a posterior skin tag but no fissures or fistulae.  Rectal exam shows no masses or tenderness with soft stool which is guaiac negative.    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  I've asked continue a high-fiber diet and have added Benefiber 1 tablespoon twice a day as tolerated with liberal by mouth fluids.  Also prescribe when necessary Anusol-HC suppositories.  I have renewed her Linzess 45 mcg daily as tolerated.  I do not think she needs followup endoscopy or colonoscopy at this time.  She is to continue her other medications as per primary care.  She is taking daily probiotics also. No diagnosis found.

## 2012-11-08 ENCOUNTER — Encounter: Payer: Self-pay | Admitting: Neurology

## 2012-11-08 ENCOUNTER — Ambulatory Visit (INDEPENDENT_AMBULATORY_CARE_PROVIDER_SITE_OTHER): Payer: Medicare Other | Admitting: Neurology

## 2012-11-08 VITALS — BP 156/86 | HR 71 | Temp 98.0°F | Ht 67.0 in | Wt 179.0 lb

## 2012-11-08 DIAGNOSIS — I679 Cerebrovascular disease, unspecified: Secondary | ICD-10-CM | POA: Insufficient documentation

## 2012-11-08 NOTE — Progress Notes (Signed)
[facility]  Provider:  Dr Vickey Huger Referring Provider: Ginette Otto, * Primary Care Physician:  Ginette Otto, MD  Chief Complaint  Patient presents with  . New Evaluation    [place],left foot clearance surgery,paper referral , rm 11    HPI:  Kimberly [name] is a 72 y.o. female here as a referral from Dr. Pete Glatter . The patient states she is here to get neurologic clearance for a planned surgery at [facility] in the near future. This is a foot surgery to the left foot bunionectomy 2 trigger tunnel releases are planned. The patient was once in in March 2011 upon request by Dr. Wyline Mood, and her gynecologist Dr. got sick at the time she also came here for surgical clearance before having a total knee replacement on the right side. A previous brain MRI had revealed lacunar strokes and 2008 loss of the common risk factor of hypertension a condition that the patient did not have. She did not have atrial fibrillation on the benign palpitations. She had normal risk factors for large vessel disease and had no history of embolism. Patient is a nonsmoker has a normal body mass index of an active lifestyle she does not have COPD on the allergic asthma.  Metabolic panels were regular , her cardiac stress test was just obtained, CRP normal level, all  by Dr. Pete Glatter and was negative.  The patient also has  Eye disease (  both eyes ) and is followed by the [facility] on [address] on [address]. The last 3 years the patient had nausea talks symptoms and all the situational elevated blood pressure. She takes aspirin 81 mg daily as a blood thinner only and this could be discontinued 3 days prior to surgery and restarted the day off.  Review of Systems: Out of a complete 14 system review, the patient complains of only the following symptoms, and all other reviewed systems are negative.  foot pain  History   Social History  . Marital Status: Married    Spouse Name: N/A    Number of  Children: 1  . Years of Education: bus.school   Occupational History  . Retired   .     Social History Main Topics  . Smoking status: Former Games developer  . Smokeless tobacco: Never Used     Comment: Stopped smoking age 7  . Alcohol Use: No  . Drug Use: No  . Sexually Active: No     Comment: HYST   Other Topics Concern  . Not on file   Social History Narrative   Daily caffeine     Family History  Problem Relation Age of Onset  . Dementia Mother   . Irritable bowel syndrome Mother   . Hypertension Father   . Heart disease Father   . Diabetes Sister   . Colon cancer Paternal Aunt   . Esophageal cancer Neg Hx   . Rectal cancer Neg Hx   . Stomach cancer Neg Hx   . Heart disease Maternal Grandmother     Past Medical History  Diagnosis Date  . DUB (dysfunctional uterine bleeding)   . Cataracts, bilateral   . Degenerative disc disease   . Bunion   . Ankle fracture     Stress fracture  . Lacunar stroke   . IBS (irritable bowel syndrome)   . Depression   . Anxiety   . GERD (gastroesophageal reflux disease)   . Diverticulosis of colon (without mention of hemorrhage) 2010    Colonoscopy  .  Stricture and stenosis of esophagus 2005,2010    EGD   . Anal polyp 1998    Flex Sig   . Internal hemorrhoids without mention of complication 1995,2005    Colonoscopy   . External hemorrhoids 2000    Colonoscopy  . Family history of malignant neoplasm of gastrointestinal tract   . Hyperlipemia   . Fibromyalgia   . Hiatal hernia 2005,2010    EGD  . Thyroid disease     Hypothyroid  . Allergy   . Anemia   . Asthma     border line has inhaler  . Stroke   . Irregular heart beat   . Pseudophakia, both eyes   . Angular blepharitis of left eye   . Retinal scar     left  . PCO (posterior capsular opacification)     left  . Dry eyes     bilateral  . Corneal scar     left eye  . PVD (posterior vitreous detachment) right  . Rotator cuff disorder     pain, left shoulder  .  Migraine   . Anxiety     Past Surgical History  Procedure Laterality Date  . Abdominal hysterectomy  1991    TAH BSO  . Tonsillectomy    . Breast lumpectomy      Left-Benign  . Knee surgery      Left  . Eye surgery      Left  . Total knee arthroplasty  2011    Right  . Cataract extraction  2013    Bilateral   . Nasal septum surgery    . Mouth surgery  11/13/11    Cyst removed from gum-benign   . Colonoscopy    . Oophorectomy      BSO  . Nasal septum surgery  1970's    Current Outpatient Prescriptions  Medication Sig Dispense Refill  . Albuterol Sulfate (PROAIR HFA IN) Inhale into the lungs as needed.       Marland Kitchen aspirin 81 MG tablet Take 81 mg by mouth daily.      . Calcium Carbonate-Vitamin D (CALCIUM + D PO) Take by mouth.      . Cholecalciferol (VITAMIN D PO) Take 1,000 mg by mouth daily.       Marland Kitchen desonide (DESOWEN) 0.05 % cream Apply topically 2 (two) times daily. Corner on left eye      . Docusate Calcium (STOOL SOFTENER PO) Take by mouth as needed.      . fluticasone (VERAMYST) 27.5 MCG/SPRAY nasal spray Place 2 sprays into the nose as needed.       . hydrocortisone (ANUSOL-HC) 25 MG suppository Place 1 suppository (25 mg total) rectally at bedtime.  12 suppository  0  . levothyroxine (SYNTHROID, LEVOTHROID) 100 MCG tablet Take 100 mcg by mouth daily before breakfast.      . Linaclotide (LINZESS) 145 MCG CAPS Take 1 capsule (145 mcg total) by mouth daily.  30 capsule  6  . Loratadine (CLARITIN PO) Take by mouth.      . Multiple Vitamin (MULTIVITAMIN) tablet Take 1 tablet by mouth daily. Chewable      . nystatin ointment (MYCOSTATIN) Apply topically as needed.  30 g  1  . omeprazole (PRILOSEC) 20 MG capsule TAKE ONE CAPSULE BY MOUTH EVERY DAY  30 capsule  6  . Polyethylene Glycol 3350 (MIRALAX PO) Take by mouth.      . Probiotic Product (ALIGN PO) Take by mouth.      Marland Kitchen  RESTASIS 0.05 % ophthalmic emulsion Uses as directed      . rosuvastatin (CRESTOR) 5 MG tablet Take 10  mg by mouth as directed.       . Thiamine Mononitrate (VITAMIN B1 PO) Take 1 capsule by mouth daily.      . vitamin C (ASCORBIC ACID) 500 MG tablet Take 500 mg by mouth daily. Chewable       No current facility-administered medications for this visit.    Allergies as of 11/08/2012 - Review Complete 11/08/2012  Allergen Reaction Noted  . Erythromycin Hives 07/12/2008  . Augmentin (amoxicillin-pot clavulanate)  09/12/2012  . Clindamycin/lincomycin  02/01/2012  . Ketoconazole Nausea And Vomiting 08/09/2011  . Septra (bactrim)  08/09/2011    Vitals: BP 156/86  Pulse 71  Temp(Src) 98 F (36.7 C) (Oral)  Ht 5\' 7"  (1.702 m)  Wt 179 lb (81.194 kg)  BMI 28.03 kg/m2 Last Weight:  Wt Readings from Last 1 Encounters:  11/08/12 179 lb (81.194 kg)   Last Height:   Ht Readings from Last 1 Encounters:  11/08/12 5\' 7"  (1.702 m)    Physical exam:  General: The patient is awake, alert and appears not in acute distress. The patient is well groomed. Head: Normocephalic, atraumatic. Neck is supple. Mallampati 2, neck circumference: 14, no retrognathia.  Cardiovascular:  Regular rate and rhythm , without  murmurs or carotid bruit, and without distended neck veins. Respiratory: Lungs are clear to auscultation. Skin:  Without evidence of edema, or rash Trunk: BMI  normal.  Neurologic exam : The patient is awake and alert, oriented to place and time.  Memory subjective  described as intact. There is a normal attention span & concentration ability. Speech is fluent without  dysarthria, dysphonia or aphasia.  Mood and affect are appropriate. Anxious.  Cranial nerves: Pupils are equal and briskly reactive to light. Funduscopic exam deferred . Extraocular movements  in vertical and horizontal planes intact and without nystagmus. Visual fields by finger perimetry are intact. Hearing to finger rub intact.  Facial sensation intact to fine touch. Facial motor strength is symmetric and tongue and uvula  move midline.  Motor exam: Normal tone and normal muscle bulk and symmetric normal strength in all extremities.  Sensory:  Fine touch, pinprick and vibration were tested in all extremities. Proprioception is tested in the upper extremities only. This was  normal.  Coordination: Rapid alternating movements in the fingers/hands is tested and normal. Finger-to-nose maneuver tested and normal without evidence of ataxia, dysmetria or tremor.  Gait and station: Patient walks without assistive device and is able and assisted stool climb up to the exam table. Strength within normal limits. Stance is stable and normal. Tandem gait is unfragmented.  Deep tendon reflexes: in the  upper and lower extremities are symmetric and intact. Babinski maneuver response is  downgoing.   Assessment:  After physical and neurologic examination, review of laboratory studies, imaging, neurophysiology testing and pre-existing records, I see no reason for the patient not to undergo surgery.   Neurologic clearance was asked and granted .  Plan:  Treatment plan and additional workup will be reviewed under Problem List.

## 2012-11-08 NOTE — Patient Instructions (Signed)
Aspirin 3 days prior to her surgery and she should be able to restart it the day of her after her surgery. I see no restriction from a neurologic standpoint for her ability to undergo the surgery. Please note that the patient does not have a stroke history, but had a silent lacunar infarct found coincidentally and 2008 in an MRI.

## 2012-11-13 ENCOUNTER — Telehealth: Payer: Self-pay | Admitting: Neurology

## 2012-11-13 NOTE — Telephone Encounter (Signed)
I called pt and she wanted to ask if Bp could be deleted from our office, due to it being high 158/86 by machine, and going to Dr. Laverle Hobby office 1 hour later and it was lower, 132/80 manually.   I told her note final.  Would relay to Dr. Vickey Huger, [facility] would look at all information. (Bp, vision, etc), and she would have her Bp done there also.  C/o physically hurting when had Bp done by machine.

## 2012-11-13 NOTE — Telephone Encounter (Signed)
Pt needs someone to call her, she is concerned about where her BP was to high on Wednesday but left to go to an apt with Dr. Pete Glatter and he said her BP was fine. She is concerned about it going to [facility] with the wrong information.

## 2012-11-28 ENCOUNTER — Other Ambulatory Visit: Payer: Self-pay

## 2012-11-28 DIAGNOSIS — Z1231 Encounter for screening mammogram for malignant neoplasm of breast: Secondary | ICD-10-CM

## 2012-12-13 ENCOUNTER — Ambulatory Visit (INDEPENDENT_AMBULATORY_CARE_PROVIDER_SITE_OTHER): Payer: Medicare Other | Admitting: Women's Health

## 2012-12-13 ENCOUNTER — Encounter: Payer: Self-pay | Admitting: Women's Health

## 2012-12-13 DIAGNOSIS — R350 Frequency of micturition: Secondary | ICD-10-CM

## 2012-12-13 DIAGNOSIS — N899 Noninflammatory disorder of vagina, unspecified: Secondary | ICD-10-CM

## 2012-12-13 DIAGNOSIS — N898 Other specified noninflammatory disorders of vagina: Secondary | ICD-10-CM

## 2012-12-13 LAB — WET PREP FOR TRICH, YEAST, CLUE
Clue Cells Wet Prep HPF POC: NONE SEEN
Trich, Wet Prep: NONE SEEN
Yeast Wet Prep HPF POC: NONE SEEN

## 2012-12-13 LAB — URINALYSIS W MICROSCOPIC + REFLEX CULTURE
Bilirubin Urine: NEGATIVE
Casts: NONE SEEN
Crystals: NONE SEEN
Glucose, UA: NEGATIVE mg/dL
Ketones, ur: NEGATIVE mg/dL
Nitrite: NEGATIVE
Protein, ur: NEGATIVE mg/dL
Specific Gravity, Urine: 1.01 (ref 1.005–1.030)
Urobilinogen, UA: 0.2 mg/dL (ref 0.0–1.0)
pH: 6 (ref 5.0–8.0)

## 2012-12-13 NOTE — Progress Notes (Signed)
Patient ID: Kimberly [name], female   DOB: [birth year], 72 y.o.   MRN: [id number] Presents with complaint of vaginal spotting with irritation. States has not been sexually active in a while due to husbands health, had intercourse several days ago and then noticed some bright red spotting. Denies pain or burning with urination, abdominal pain or fever. TAH with BSO.   Exam: Appears well, external genitalia erythematous, wet prep done with Q-tip no noted blood. Wet prep negative. UA: moderate blood, trace leukocytes, 0 - 2 WBCs, 7-10 RBCs.  Vaginal atrophy hematuria  Plan: Hydrogel sample given with instructions to use with intercourse. Reviewed best not to use vaginal estrogen due to numerous health issues. Scheduled for bunion surgery. Urine culture pending. Recheck UA 2 weeks.

## 2012-12-15 ENCOUNTER — Other Ambulatory Visit: Payer: Self-pay | Admitting: Women's Health

## 2012-12-15 DIAGNOSIS — R319 Hematuria, unspecified: Secondary | ICD-10-CM

## 2012-12-15 LAB — URINE CULTURE
Colony Count: NO GROWTH
Organism ID, Bacteria: NO GROWTH

## 2012-12-26 ENCOUNTER — Other Ambulatory Visit: Payer: Medicare Other

## 2012-12-26 DIAGNOSIS — R319 Hematuria, unspecified: Secondary | ICD-10-CM

## 2012-12-26 LAB — URINALYSIS W MICROSCOPIC + REFLEX CULTURE
Bacteria, UA: NONE SEEN
Bilirubin Urine: NEGATIVE
Casts: NONE SEEN
Crystals: NONE SEEN
Glucose, UA: NEGATIVE mg/dL
Ketones, ur: NEGATIVE mg/dL
Leukocytes, UA: NEGATIVE
Nitrite: NEGATIVE
Protein, ur: NEGATIVE mg/dL
Specific Gravity, Urine: 1.006 (ref 1.005–1.030)
Squamous Epithelial / LPF: NONE SEEN
Urobilinogen, UA: 0.2 mg/dL (ref 0.0–1.0)
pH: 6.5 (ref 5.0–8.0)

## 2013-01-01 ENCOUNTER — Telehealth: Payer: Self-pay | Admitting: Neurology

## 2013-01-01 NOTE — Telephone Encounter (Signed)
I spoke to patient and have re faxed note to [facility] the surgical clearance coordinator at [facility].  The fax number is [phone].  I have tried to fax 3 times and it has not gone through.  I left a message for the patient and also for [facility], that the fax is not going through and have asked for an alternative number.

## 2013-01-03 ENCOUNTER — Other Ambulatory Visit: Payer: Self-pay

## 2013-01-15 DIAGNOSIS — L0390 Cellulitis, unspecified: Secondary | ICD-10-CM | POA: Insufficient documentation

## 2013-01-28 ENCOUNTER — Encounter (HOSPITAL_BASED_OUTPATIENT_CLINIC_OR_DEPARTMENT_OTHER): Payer: Self-pay | Admitting: Emergency Medicine

## 2013-01-28 ENCOUNTER — Emergency Department (HOSPITAL_BASED_OUTPATIENT_CLINIC_OR_DEPARTMENT_OTHER): Payer: Medicare Other

## 2013-01-28 ENCOUNTER — Emergency Department (HOSPITAL_BASED_OUTPATIENT_CLINIC_OR_DEPARTMENT_OTHER)
Admission: EM | Admit: 2013-01-28 | Discharge: 2013-01-28 | Disposition: A | Discharge status: Home / Self Care | Payer: Medicare Other | Attending: Emergency Medicine | Admitting: Emergency Medicine

## 2013-01-28 DIAGNOSIS — E079 Disorder of thyroid, unspecified: Secondary | ICD-10-CM | POA: Insufficient documentation

## 2013-01-28 DIAGNOSIS — IMO0001 Reserved for inherently not codable concepts without codable children: Secondary | ICD-10-CM | POA: Insufficient documentation

## 2013-01-28 DIAGNOSIS — M7989 Other specified soft tissue disorders: Secondary | ICD-10-CM | POA: Insufficient documentation

## 2013-01-28 DIAGNOSIS — Z961 Presence of intraocular lens: Secondary | ICD-10-CM | POA: Insufficient documentation

## 2013-01-28 DIAGNOSIS — Z8679 Personal history of other diseases of the circulatory system: Secondary | ICD-10-CM | POA: Insufficient documentation

## 2013-01-28 DIAGNOSIS — Z8742 Personal history of other diseases of the female genital tract: Secondary | ICD-10-CM | POA: Insufficient documentation

## 2013-01-28 DIAGNOSIS — Z79899 Other long term (current) drug therapy: Secondary | ICD-10-CM | POA: Insufficient documentation

## 2013-01-28 DIAGNOSIS — Z8669 Personal history of other diseases of the nervous system and sense organs: Secondary | ICD-10-CM | POA: Insufficient documentation

## 2013-01-28 DIAGNOSIS — R6883 Chills (without fever): Secondary | ICD-10-CM | POA: Insufficient documentation

## 2013-01-28 DIAGNOSIS — E785 Hyperlipidemia, unspecified: Secondary | ICD-10-CM | POA: Insufficient documentation

## 2013-01-28 DIAGNOSIS — Z8659 Personal history of other mental and behavioral disorders: Secondary | ICD-10-CM | POA: Insufficient documentation

## 2013-01-28 DIAGNOSIS — Z8781 Personal history of (healed) traumatic fracture: Secondary | ICD-10-CM | POA: Insufficient documentation

## 2013-01-28 DIAGNOSIS — Z7982 Long term (current) use of aspirin: Secondary | ICD-10-CM | POA: Insufficient documentation

## 2013-01-28 DIAGNOSIS — Z862 Personal history of diseases of the blood and blood-forming organs and certain disorders involving the immune mechanism: Secondary | ICD-10-CM | POA: Insufficient documentation

## 2013-01-28 DIAGNOSIS — Z87891 Personal history of nicotine dependence: Secondary | ICD-10-CM | POA: Insufficient documentation

## 2013-01-28 DIAGNOSIS — Z8739 Personal history of other diseases of the musculoskeletal system and connective tissue: Secondary | ICD-10-CM | POA: Insufficient documentation

## 2013-01-28 DIAGNOSIS — K589 Irritable bowel syndrome without diarrhea: Secondary | ICD-10-CM | POA: Insufficient documentation

## 2013-01-28 DIAGNOSIS — Z8719 Personal history of other diseases of the digestive system: Secondary | ICD-10-CM | POA: Insufficient documentation

## 2013-01-28 DIAGNOSIS — Z8673 Personal history of transient ischemic attack (TIA), and cerebral infarction without residual deficits: Secondary | ICD-10-CM | POA: Insufficient documentation

## 2013-01-28 LAB — CBC WITH DIFFERENTIAL/PLATELET
Basophils Absolute: 0 10*3/uL (ref 0.0–0.1)
Basophils Relative: 1 % (ref 0–1)
Eosinophils Absolute: 0.2 10*3/uL (ref 0.0–0.7)
Eosinophils Relative: 3 % (ref 0–5)
HCT: 38.5 % (ref 36.0–46.0)
Hemoglobin: 13 g/dL (ref 12.0–15.0)
Lymphocytes Relative: 35 % (ref 12–46)
Lymphs Abs: 2.3 10*3/uL (ref 0.7–4.0)
MCH: 31.8 pg (ref 26.0–34.0)
MCHC: 33.8 g/dL (ref 30.0–36.0)
MCV: 94.1 fL (ref 78.0–100.0)
Monocytes Absolute: 0.7 10*3/uL (ref 0.1–1.0)
Monocytes Relative: 10 % (ref 3–12)
Neutro Abs: 3.4 10*3/uL (ref 1.7–7.7)
Neutrophils Relative %: 51 % (ref 43–77)
Platelets: 262 10*3/uL (ref 150–400)
RBC: 4.09 MIL/uL (ref 3.87–5.11)
RDW: 12.2 % (ref 11.5–15.5)
WBC: 6.6 10*3/uL (ref 4.0–10.5)

## 2013-01-28 LAB — COMPREHENSIVE METABOLIC PANEL
ALT: 11 U/L (ref 0–35)
AST: 23 U/L (ref 0–37)
Albumin: 4 g/dL (ref 3.5–5.2)
Alkaline Phosphatase: 57 U/L (ref 39–117)
BUN: 12 mg/dL (ref 6–23)
CO2: 28 mEq/L (ref 19–32)
Calcium: 9.9 mg/dL (ref 8.4–10.5)
Chloride: 100 mEq/L (ref 96–112)
Creatinine, Ser: 0.8 mg/dL (ref 0.50–1.10)
GFR calc Af Amer: 84 mL/min — ABNORMAL LOW (ref >90)
GFR calc non Af Amer: 72 mL/min — ABNORMAL LOW (ref >90)
Glucose, Bld: 102 mg/dL — ABNORMAL HIGH (ref 70–99)
Potassium: 3.7 mEq/L (ref 3.5–5.1)
Sodium: 138 mEq/L (ref 135–145)
Total Bilirubin: 0.2 mg/dL — ABNORMAL LOW (ref 0.3–1.2)
Total Protein: 7.2 g/dL (ref 6.0–8.3)

## 2013-01-28 LAB — CG4 I-STAT (LACTIC ACID): Lactic Acid, Venous: 0.62 mmol/L (ref 0.5–2.2)

## 2013-01-28 MED ORDER — HYDROCODONE-ACETAMINOPHEN 5-325 MG PO TABS: 2.0000 | ORAL_TABLET | Freq: Once | ORAL | Status: DC

## 2013-01-28 MED ORDER — ACETAMINOPHEN 500 MG PO TABS
1000.0000 mg | ORAL_TABLET | Freq: Once | ORAL | Status: AC
  Administered 2013-01-28: 1000 mg via ORAL
  Filled 2013-01-28: qty 2

## 2013-01-28 NOTE — ED Provider Notes (Signed)
This chart was scribed for Kimberly Maw Madilyn Cephas, DO by Leone Payor, ED Scribe. This patient was seen in room MH01/MH01 and the patient's care was started 3:28 PM.  TIME SEEN: 3:28 PM  CHIEF COMPLAINT: cellulitis   HPI: HPI Comments: Kimberly [name] is a 72 y.o. female who presents to the Emergency Department complaining of cellulitis that began after having a left foot bunionectomy and hammer toe surgery by Dr. Hillery Hunter at [facility] on 01/09/13. She developed cellulitis and was started on Cipro which she has been on for 3 weeks. She states having some pain but also has noticed extra warmth to the left foot. She has associated chills but no fevers. She denies nausea, emesis, diarrhea, pus from surgical site. He reports her husband fell several days ago and she has been with him in the hospital and doing more around her house and normal. She states she has been on her feet frequently and not keeping her leg elevated as well as she should.  ROS: See HPI Constitutional: chills but no fever Eyes: no drainage  ENT: no runny nose   Cardiovascular:  no chest pain  Resp: no SOB  GI: no nausea, vomiting, diarrhea GU: no dysuria Integumentary: no rash  Allergy: no hives  Musculoskeletal: no leg swelling  Neurological: no slurred speech ROS otherwise negative  PAST MEDICAL HISTORY/PAST SURGICAL HISTORY:  Past Medical History  Diagnosis Date  . DUB (dysfunctional uterine bleeding)   . Cataracts, bilateral   . Degenerative disc disease   . Bunion   . Ankle fracture     Stress fracture  . Lacunar stroke   . IBS (irritable bowel syndrome)   . Depression   . Anxiety   . GERD (gastroesophageal reflux disease)   . Diverticulosis of colon (without mention of hemorrhage) 2010    Colonoscopy  . Stricture and stenosis of esophagus 2005,2010    EGD   . Anal polyp 1998    Flex Sig   . Internal hemorrhoids without mention of complication 1995,2005    Colonoscopy   . External hemorrhoids 2000   Colonoscopy  . Family history of malignant neoplasm of gastrointestinal tract   . Hyperlipemia   . Fibromyalgia   . Hiatal hernia 2005,2010    EGD  . Thyroid disease     Hypothyroid  . Allergy   . Anemia   . Asthma     border line has inhaler  . Stroke   . Irregular heart beat   . Pseudophakia, both eyes   . Angular blepharitis of left eye   . Retinal scar     left  . PCO (posterior capsular opacification)     left  . Dry eyes     bilateral  . Corneal scar     left eye  . PVD (posterior vitreous detachment) right  . Rotator cuff disorder     pain, left shoulder  . Migraine   . Anxiety     MEDICATIONS:  Prior to Admission medications   Medication Sig Start Date End Date Taking? Authorizing Provider  Albuterol Sulfate (PROAIR HFA IN) Inhale into the lungs as needed.     Historical Provider, MD  aspirin 81 MG tablet Take 81 mg by mouth daily.    Historical Provider, MD  Calcium Carbonate-Vitamin D (CALCIUM + D PO) Take by mouth.    Historical Provider, MD  Cholecalciferol (VITAMIN D PO) Take 50,000 mg by mouth daily.     Historical Provider, MD  desonide (  DESOWEN) 0.05 % cream Apply topically 2 (two) times daily. Corner on left eye    Historical Provider, MD  Docusate Calcium (STOOL SOFTENER PO) Take by mouth as needed.    Historical Provider, MD  fluticasone (VERAMYST) 27.5 MCG/SPRAY nasal spray Place 2 sprays into the nose as needed.     Historical Provider, MD  hydrocortisone (ANUSOL-HC) 25 MG suppository Place 1 suppository (25 mg total) rectally at bedtime. 10/24/12   Mardella Layman, MD  levothyroxine (SYNTHROID, LEVOTHROID) 100 MCG tablet Take 100 mcg by mouth daily before breakfast.    Historical Provider, MD  Linaclotide (LINZESS) 145 MCG CAPS Take 1 capsule (145 mcg total) by mouth daily. 10/24/12   Mardella Layman, MD  Loratadine (CLARITIN PO) Take by mouth.    Historical Provider, MD  Multiple Vitamin (MULTIVITAMIN) tablet Take 1 tablet by mouth daily. Chewable     Historical Provider, MD  nystatin ointment (MYCOSTATIN) Apply topically as needed. 07/21/12   Harrington Challenger, NP  omeprazole (PRILOSEC) 20 MG capsule TAKE ONE CAPSULE BY MOUTH EVERY DAY 07/05/12   Mardella Layman, MD  Polyethylene Glycol 3350 (MIRALAX PO) Take by mouth.    Historical Provider, MD  Probiotic Product (ALIGN PO) Take by mouth.    Historical Provider, MD  RESTASIS 0.05 % ophthalmic emulsion Uses as directed 09/30/12   Historical Provider, MD  rosuvastatin (CRESTOR) 5 MG tablet Take 10 mg by mouth as directed.     Historical Provider, MD  Thiamine Mononitrate (VITAMIN B1 PO) Take 1 capsule by mouth daily.    Historical Provider, MD  vitamin C (ASCORBIC ACID) 500 MG tablet Take 500 mg by mouth daily. Chewable    Historical Provider, MD    ALLERGIES:  Allergies  Allergen Reactions  . Erythromycin Hives  . Augmentin [Amoxicillin-Pot Clavulanate]   . Clindamycin/Lincomycin     Stomach upset   . Ketoconazole Nausea And Vomiting  . Septra [Bactrim]     Upset stomach  . Simvastatin     Muscle pain    SOCIAL HISTORY:  History  Substance Use Topics  . Smoking status: Former Games developer  . Smokeless tobacco: Never Used     Comment: Stopped smoking age 89  . Alcohol Use: No    FAMILY HISTORY: Family History  Problem Relation Age of Onset  . Dementia Mother   . Irritable bowel syndrome Mother   . Hypertension Father   . Heart disease Father   . Diabetes Sister   . Colon cancer Paternal Aunt   . Esophageal cancer Neg Hx   . Rectal cancer Neg Hx   . Stomach cancer Neg Hx   . Heart disease Maternal Grandmother     EXAM: BP 130/73  Pulse 72  Temp(Src) 98.2 F (36.8 C) (Oral) CONSTITUTIONAL: Alert and oriented and responds appropriately to questions. Well-appearing; well-nourished HEAD: Normocephalic EYES: Conjunctivae clear, PERRL ENT: normal nose; no rhinorrhea; moist mucous membranes; pharynx without lesions noted NECK: Supple, no meningismus, no LAD  CARD: RRR; S1  and S2 appreciated; no murmurs, no clicks, no rubs, no gallops RESP: Normal chest excursion without splinting or tachypnea; breath sounds clear and equal bilaterally; no wheezes, no rhonchi, no rales,  ABD/GI: Normal bowel sounds; non-distended; soft, non-tender, no rebound, no guarding BACK:  The back appears normal and is non-tender to palpation, there is no CVA tenderness EXT: Normal ROM in all joints; non-tender to palpation; no edema; normal capillary refill; no cyanosis. Left foot with 3+ pitting edema to level  of ankle. Mild warmth, no erythema or induration. 2 vertical healing surgical incisions with sutures intact. Clean and dry incision over dorsal 1st and 2nd toe. Pain in the tip of the 2nd toe. 2+ DP pulses. Sensation to light touch intact.  SKIN: Normal color for age and race; warm NEURO: Moves all extremities equally PSYCH: The patient's mood and manner are appropriate. Grooming and personal hygiene are appropriate.  MEDICAL DECISION MAKING:   Doubt cellulitis most likely dependent edema from being on feet for past several days. Will order labs, XRAY. Patient denies wanting any pain medication at this time other than Tylenol. She has followup with her orthopedist in 3 days.   ED PROGRESS:   3:52 PM Will give tylenol for pain. Discussed treatment plan with pt at bedside and pt agreed to plan.  5:40 PM  Patient has no leukocytosis. Lactate is negative. Cultures pending. X-ray shows no osteomyelitis. Given her physical exam, normal vital signs are normal labs, I am not concerned for cellulitis at this time. We'll have the patient continue her Cipro as instructed by orthopedist. She has followup in 3 days. Have given customary in usual return precautions. I feel her pain and swelling is likely secondary to increased use of her foot over the past several days. Have instructed her to continue to apply ice and elevate her foot. Also started her to use compression stockings to help with  swelling. Patient verbalizes understanding is comfortable this plan.    I personally performed the services described in this documentation, which was scribed in my presence. The recorded information has been reviewed and is accurate.    Kimberly Maw Yang Rack, DO 01/29/13 [id number]

## 2013-01-28 NOTE — ED Notes (Signed)
Pt had bunionectomy and hammer toe on the 12th, developed cellulits and started on Cipro, was seen this past Tuesday and Cipro continued, pt states states it is spreading

## 2013-01-30 ENCOUNTER — Ambulatory Visit: Payer: Medicare Other

## 2013-02-05 LAB — CULTURE, BLOOD (ROUTINE X 2)
Culture: NO GROWTH
Culture: NO GROWTH

## 2013-02-15 ENCOUNTER — Ambulatory Visit: Payer: Medicare Other

## 2013-03-06 ENCOUNTER — Ambulatory Visit: Payer: Medicare Other

## 2013-03-07 DIAGNOSIS — H31009 Unspecified chorioretinal scars, unspecified eye: Secondary | ICD-10-CM | POA: Insufficient documentation

## 2013-03-07 DIAGNOSIS — H53009 Unspecified amblyopia, unspecified eye: Secondary | ICD-10-CM | POA: Insufficient documentation

## 2013-03-27 ENCOUNTER — Ambulatory Visit: Payer: Medicare Other

## 2013-04-05 ENCOUNTER — Other Ambulatory Visit: Payer: Self-pay

## 2013-04-05 ENCOUNTER — Ambulatory Visit
Admission: RE | Admit: 2013-04-05 | Discharge: 2013-04-05 | Disposition: A | Discharge status: Home / Self Care | Payer: Medicare Other | Source: Ambulatory Visit

## 2013-04-05 DIAGNOSIS — Z1231 Encounter for screening mammogram for malignant neoplasm of breast: Secondary | ICD-10-CM

## 2013-06-13 ENCOUNTER — Other Ambulatory Visit: Payer: Self-pay | Admitting: Geriatric Medicine

## 2013-06-13 ENCOUNTER — Ambulatory Visit
Admission: RE | Admit: 2013-06-13 | Discharge: 2013-06-13 | Disposition: A | Discharge status: Home / Self Care | Payer: Medicare Other | Source: Ambulatory Visit | Attending: Geriatric Medicine | Admitting: Geriatric Medicine

## 2013-06-13 DIAGNOSIS — R609 Edema, unspecified: Secondary | ICD-10-CM

## 2013-07-07 ENCOUNTER — Encounter: Payer: Self-pay | Admitting: *Deleted

## 2013-07-22 ENCOUNTER — Inpatient Hospital Stay (HOSPITAL_BASED_OUTPATIENT_CLINIC_OR_DEPARTMENT_OTHER)
Admission: EM | Admit: 2013-07-22 | Discharge: 2013-07-24 | DRG: 440 | Disposition: A | Discharge status: Home / Self Care | Payer: Medicare Other | Attending: Internal Medicine | Admitting: Internal Medicine

## 2013-07-22 ENCOUNTER — Emergency Department (HOSPITAL_BASED_OUTPATIENT_CLINIC_OR_DEPARTMENT_OTHER): Payer: Medicare Other

## 2013-07-22 ENCOUNTER — Encounter (HOSPITAL_BASED_OUTPATIENT_CLINIC_OR_DEPARTMENT_OTHER): Payer: Self-pay | Admitting: Emergency Medicine

## 2013-07-22 DIAGNOSIS — Z8673 Personal history of transient ischemic attack (TIA), and cerebral infarction without residual deficits: Secondary | ICD-10-CM

## 2013-07-22 DIAGNOSIS — S82899A Other fracture of unspecified lower leg, initial encounter for closed fracture: Secondary | ICD-10-CM

## 2013-07-22 DIAGNOSIS — D72829 Elevated white blood cell count, unspecified: Secondary | ICD-10-CM

## 2013-07-22 DIAGNOSIS — K219 Gastro-esophageal reflux disease without esophagitis: Secondary | ICD-10-CM

## 2013-07-22 DIAGNOSIS — R197 Diarrhea, unspecified: Secondary | ICD-10-CM

## 2013-07-22 DIAGNOSIS — E039 Hypothyroidism, unspecified: Secondary | ICD-10-CM

## 2013-07-22 DIAGNOSIS — K648 Other hemorrhoids: Secondary | ICD-10-CM

## 2013-07-22 DIAGNOSIS — R079 Chest pain, unspecified: Secondary | ICD-10-CM

## 2013-07-22 DIAGNOSIS — I6381 Other cerebral infarction due to occlusion or stenosis of small artery: Secondary | ICD-10-CM

## 2013-07-22 DIAGNOSIS — G43909 Migraine, unspecified, not intractable, without status migrainosus: Secondary | ICD-10-CM | POA: Diagnosis present

## 2013-07-22 DIAGNOSIS — F411 Generalized anxiety disorder: Secondary | ICD-10-CM

## 2013-07-22 DIAGNOSIS — K589 Irritable bowel syndrome without diarrhea: Secondary | ICD-10-CM

## 2013-07-22 DIAGNOSIS — T380X5A Adverse effect of glucocorticoids and synthetic analogues, initial encounter: Secondary | ICD-10-CM | POA: Diagnosis present

## 2013-07-22 DIAGNOSIS — Z87891 Personal history of nicotine dependence: Secondary | ICD-10-CM

## 2013-07-22 DIAGNOSIS — T363X5A Adverse effect of macrolides, initial encounter: Secondary | ICD-10-CM | POA: Diagnosis present

## 2013-07-22 DIAGNOSIS — T466X5A Adverse effect of antihyperlipidemic and antiarteriosclerotic drugs, initial encounter: Secondary | ICD-10-CM | POA: Diagnosis present

## 2013-07-22 DIAGNOSIS — E7800 Pure hypercholesterolemia, unspecified: Secondary | ICD-10-CM

## 2013-07-22 DIAGNOSIS — Z881 Allergy status to other antibiotic agents status: Secondary | ICD-10-CM

## 2013-07-22 DIAGNOSIS — F3289 Other specified depressive episodes: Secondary | ICD-10-CM

## 2013-07-22 DIAGNOSIS — Z96659 Presence of unspecified artificial knee joint: Secondary | ICD-10-CM

## 2013-07-22 DIAGNOSIS — F329 Major depressive disorder, single episode, unspecified: Secondary | ICD-10-CM

## 2013-07-22 DIAGNOSIS — Z888 Allergy status to other drugs, medicaments and biological substances status: Secondary | ICD-10-CM

## 2013-07-22 DIAGNOSIS — I679 Cerebrovascular disease, unspecified: Secondary | ICD-10-CM

## 2013-07-22 DIAGNOSIS — IMO0001 Reserved for inherently not codable concepts without codable children: Secondary | ICD-10-CM

## 2013-07-22 DIAGNOSIS — N938 Other specified abnormal uterine and vaginal bleeding: Secondary | ICD-10-CM

## 2013-07-22 DIAGNOSIS — R1319 Other dysphagia: Secondary | ICD-10-CM

## 2013-07-22 DIAGNOSIS — Z833 Family history of diabetes mellitus: Secondary | ICD-10-CM

## 2013-07-22 DIAGNOSIS — Z8249 Family history of ischemic heart disease and other diseases of the circulatory system: Secondary | ICD-10-CM

## 2013-07-22 DIAGNOSIS — Z860100 Personal history of colon polyps, unspecified: Secondary | ICD-10-CM

## 2013-07-22 DIAGNOSIS — I6350 Cerebral infarction due to unspecified occlusion or stenosis of unspecified cerebral artery: Secondary | ICD-10-CM

## 2013-07-22 DIAGNOSIS — Z79899 Other long term (current) drug therapy: Secondary | ICD-10-CM

## 2013-07-22 DIAGNOSIS — K859 Acute pancreatitis without necrosis or infection, unspecified: Principal | ICD-10-CM

## 2013-07-22 DIAGNOSIS — M21619 Bunion of unspecified foot: Secondary | ICD-10-CM

## 2013-07-22 DIAGNOSIS — K649 Unspecified hemorrhoids: Secondary | ICD-10-CM

## 2013-07-22 DIAGNOSIS — E785 Hyperlipidemia, unspecified: Secondary | ICD-10-CM | POA: Diagnosis present

## 2013-07-22 DIAGNOSIS — IMO0002 Reserved for concepts with insufficient information to code with codable children: Secondary | ICD-10-CM

## 2013-07-22 DIAGNOSIS — K5730 Diverticulosis of large intestine without perforation or abscess without bleeding: Secondary | ICD-10-CM

## 2013-07-22 DIAGNOSIS — M1990 Unspecified osteoarthritis, unspecified site: Secondary | ICD-10-CM

## 2013-07-22 DIAGNOSIS — H269 Unspecified cataract: Secondary | ICD-10-CM

## 2013-07-22 DIAGNOSIS — K222 Esophageal obstruction: Secondary | ICD-10-CM

## 2013-07-22 DIAGNOSIS — Z823 Family history of stroke: Secondary | ICD-10-CM

## 2013-07-22 DIAGNOSIS — K5900 Constipation, unspecified: Secondary | ICD-10-CM

## 2013-07-22 DIAGNOSIS — K449 Diaphragmatic hernia without obstruction or gangrene: Secondary | ICD-10-CM

## 2013-07-22 DIAGNOSIS — Z7982 Long term (current) use of aspirin: Secondary | ICD-10-CM

## 2013-07-22 LAB — CBC
HCT: 37.1 % (ref 36.0–46.0)
Hemoglobin: 12.4 g/dL (ref 12.0–15.0)
MCH: 30.3 pg (ref 26.0–34.0)
MCHC: 33.4 g/dL (ref 30.0–36.0)
MCV: 90.7 fL (ref 78.0–100.0)
Platelets: 157 10*3/uL (ref 150–400)
RBC: 4.09 MIL/uL (ref 3.87–5.11)
RDW: 13.5 % (ref 11.5–15.5)
WBC: 10 10*3/uL (ref 4.0–10.5)

## 2013-07-22 LAB — COMPREHENSIVE METABOLIC PANEL
ALT: 18 U/L (ref 0–35)
AST: 24 U/L (ref 0–37)
Albumin: 3.7 g/dL (ref 3.5–5.2)
Alkaline Phosphatase: 50 U/L (ref 39–117)
BUN: 21 mg/dL (ref 6–23)
CO2: 24 mEq/L (ref 19–32)
Calcium: 9 mg/dL (ref 8.4–10.5)
Chloride: 102 mEq/L (ref 96–112)
Creatinine, Ser: 0.7 mg/dL (ref 0.50–1.10)
GFR calc Af Amer: >90 mL/min (ref >90)
GFR calc non Af Amer: 85 mL/min — ABNORMAL LOW (ref >90)
Glucose, Bld: 137 mg/dL — ABNORMAL HIGH (ref 70–99)
Potassium: 3.8 mEq/L (ref 3.7–5.3)
Sodium: 140 mEq/L (ref 137–147)
Total Bilirubin: 0.4 mg/dL (ref 0.3–1.2)
Total Protein: 6.9 g/dL (ref 6.0–8.3)

## 2013-07-22 LAB — CBC WITH DIFFERENTIAL/PLATELET
Basophils Absolute: 0 10*3/uL (ref 0.0–0.1)
Basophils Relative: 0 % (ref 0–1)
Eosinophils Absolute: 0.1 10*3/uL (ref 0.0–0.7)
Eosinophils Relative: 1 % (ref 0–5)
HCT: 41 % (ref 36.0–46.0)
Hemoglobin: 13.9 g/dL (ref 12.0–15.0)
Lymphocytes Relative: 4 % — ABNORMAL LOW (ref 12–46)
Lymphs Abs: 0.7 10*3/uL (ref 0.7–4.0)
MCH: 30.8 pg (ref 26.0–34.0)
MCHC: 33.9 g/dL (ref 30.0–36.0)
MCV: 90.9 fL (ref 78.0–100.0)
Monocytes Absolute: 0.8 10*3/uL (ref 0.1–1.0)
Monocytes Relative: 5 % (ref 3–12)
Neutro Abs: 16.8 10*3/uL — ABNORMAL HIGH (ref 1.7–7.7)
Neutrophils Relative %: 91 % — ABNORMAL HIGH (ref 43–77)
Platelets: 191 10*3/uL (ref 150–400)
RBC: 4.51 MIL/uL (ref 3.87–5.11)
RDW: 13.1 % (ref 11.5–15.5)
WBC: 18.4 10*3/uL — ABNORMAL HIGH (ref 4.0–10.5)

## 2013-07-22 LAB — TROPONIN I: Troponin I: <0.3 ng/mL (ref <0.30)

## 2013-07-22 LAB — URINE MICROSCOPIC-ADD ON

## 2013-07-22 LAB — CREATININE, SERUM
Creatinine, Ser: 0.63 mg/dL (ref 0.50–1.10)
GFR calc Af Amer: >90 mL/min (ref >90)
GFR calc non Af Amer: 87 mL/min — ABNORMAL LOW (ref >90)

## 2013-07-22 LAB — URINALYSIS, ROUTINE W REFLEX MICROSCOPIC
Bilirubin Urine: NEGATIVE
Glucose, UA: NEGATIVE mg/dL
Ketones, ur: NEGATIVE mg/dL
Leukocytes, UA: NEGATIVE
Nitrite: NEGATIVE
Protein, ur: NEGATIVE mg/dL
Specific Gravity, Urine: 1.014 (ref 1.005–1.030)
Urobilinogen, UA: 0.2 mg/dL (ref 0.0–1.0)
pH: 7.5 (ref 5.0–8.0)

## 2013-07-22 LAB — I-STAT CG4 LACTIC ACID, ED: Lactic Acid, Venous: 1.41 mmol/L (ref 0.5–2.2)

## 2013-07-22 LAB — LIPASE, BLOOD: Lipase: 859 U/L — ABNORMAL HIGH (ref 11–59)

## 2013-07-22 MED ORDER — ONDANSETRON HCL 4 MG/2ML IJ SOLN
4.0000 mg | Freq: Once | INTRAMUSCULAR | Status: AC
  Administered 2013-07-22: 4 mg via INTRAVENOUS

## 2013-07-22 MED ORDER — ASPIRIN 81 MG PO CHEW
81.0000 mg | CHEWABLE_TABLET | Freq: Every day | ORAL | Status: DC
  Administered 2013-07-23: 81 mg via ORAL
  Filled 2013-07-22 (×3): qty 1

## 2013-07-22 MED ORDER — MORPHINE SULFATE 2 MG/ML IJ SOLN: 2.0000 mg | INTRAMUSCULAR | Status: DC | PRN

## 2013-07-22 MED ORDER — ONDANSETRON HCL 4 MG/2ML IJ SOLN
INTRAMUSCULAR | Status: AC
  Filled 2013-07-22: qty 2

## 2013-07-22 MED ORDER — ONDANSETRON HCL 4 MG/2ML IJ SOLN: 4.0000 mg | Freq: Three times a day (TID) | INTRAMUSCULAR | Status: DC | PRN

## 2013-07-22 MED ORDER — CYCLOSPORINE 0.05 % OP EMUL
1.0000 [drp] | Freq: Two times a day (BID) | OPHTHALMIC | Status: DC
  Administered 2013-07-22 – 2013-07-23 (×2): 1 [drp] via OPHTHALMIC
  Filled 2013-07-22 (×5): qty 1

## 2013-07-22 MED ORDER — PROMETHAZINE HCL 25 MG/ML IJ SOLN
12.5000 mg | Freq: Once | INTRAMUSCULAR | Status: AC
  Administered 2013-07-22: 12.5 mg via INTRAVENOUS

## 2013-07-22 MED ORDER — BIOTENE DRY MOUTH MT LIQD
15.0000 mL | Freq: Two times a day (BID) | OROMUCOSAL | Status: DC
  Administered 2013-07-23 (×2): 15 mL via OROMUCOSAL

## 2013-07-22 MED ORDER — LEVOTHYROXINE SODIUM 100 MCG PO TABS
100.0000 ug | ORAL_TABLET | Freq: Every day | ORAL | Status: DC
  Administered 2013-07-23 – 2013-07-24 (×2): 100 ug via ORAL
  Filled 2013-07-22 (×3): qty 1

## 2013-07-22 MED ORDER — SODIUM CHLORIDE 0.9 % IV BOLUS (SEPSIS)
1000.0000 mL | Freq: Once | INTRAVENOUS | Status: AC
  Administered 2013-07-22: 1000 mL via INTRAVENOUS

## 2013-07-22 MED ORDER — IOHEXOL 300 MG/ML  SOLN
100.0000 mL | Freq: Once | INTRAMUSCULAR | Status: AC | PRN
  Administered 2013-07-22: 100 mL via INTRAVENOUS

## 2013-07-22 MED ORDER — SODIUM CHLORIDE 0.9 % IV SOLN: INTRAVENOUS | Status: DC

## 2013-07-22 MED ORDER — IOHEXOL 300 MG/ML  SOLN
50.0000 mL | Freq: Once | INTRAMUSCULAR | Status: AC | PRN
  Administered 2013-07-22: 50 mL via ORAL

## 2013-07-22 MED ORDER — ONDANSETRON HCL 4 MG/2ML IJ SOLN
INTRAMUSCULAR | Status: AC
  Administered 2013-07-22: 4 mg via INTRAVENOUS
  Filled 2013-07-22: qty 2

## 2013-07-22 MED ORDER — SODIUM CHLORIDE 0.9 % IV SOLN
INTRAVENOUS | Status: DC
  Administered 2013-07-22 – 2013-07-23 (×2): via INTRAVENOUS

## 2013-07-22 MED ORDER — SALINE SPRAY 0.65 % NA SOLN
1.0000 | NASAL | Status: DC | PRN
  Administered 2013-07-22: 1 via NASAL
  Filled 2013-07-22: qty 44

## 2013-07-22 MED ORDER — PANTOPRAZOLE SODIUM 40 MG IV SOLR
40.0000 mg | INTRAVENOUS | Status: DC
  Administered 2013-07-22: 40 mg via INTRAVENOUS
  Filled 2013-07-22 (×2): qty 40

## 2013-07-22 MED ORDER — ENOXAPARIN SODIUM 40 MG/0.4ML ~~LOC~~ SOLN
40.0000 mg | SUBCUTANEOUS | Status: DC
Start: 2013-07-22 — End: 2013-07-24
  Filled 2013-07-22 (×3): qty 0.4

## 2013-07-22 MED ORDER — ONDANSETRON 4 MG PO TBDP
ORAL_TABLET | ORAL | Status: AC
  Filled 2013-07-22: qty 1

## 2013-07-22 MED ORDER — PROMETHAZINE HCL 25 MG/ML IJ SOLN
INTRAMUSCULAR | Status: AC
  Administered 2013-07-22: 12.5 mg via INTRAVENOUS
  Filled 2013-07-22: qty 1

## 2013-07-22 NOTE — ED Notes (Signed)
Patient transported to CT 

## 2013-07-22 NOTE — ED Notes (Signed)
Patient returned from CT

## 2013-07-22 NOTE — Progress Notes (Signed)
BP 125/66  Pulse 99  Temp(Src) 98.3 F (36.8 C) (Oral)  Resp 21  Ht 5\' 8"  (1.727 m)  Wt 79.379 kg (175 lb)  BMI 26.61 kg/m2  SpO2 94% Emesis /Abdominal Pain: 73 year old female with a history of IBS, GERD, diverticulosis, hyperlipidemia who presents with abdominal pain and vomiting. She has vomited every 20 minutes since 2:30 along with abdominal pain. Lipase >800, multiple dose of phenergan and zofran have not improved her nausea. CT abdomen showed no biliary stones. Transfer to med-surg IV fluids, narcotics for pain, and phenergan.

## 2013-07-22 NOTE — ED Notes (Signed)
N/V and abdominal pain since this am.  Some indigestion all week.  No diarrhea, no fever.

## 2013-07-22 NOTE — ED Provider Notes (Signed)
CSN: [id number]     Arrival date & time 07/22/13  [id number] History   First MD Initiated Contact with Patient 07/22/13 [phone]     Chief Complaint  Patient presents with  . Emesis  . Abdominal Pain     (Consider location/radiation/quality/duration/timing/severity/associated sxs/prior Treatment) HPI  This a 73 year old female with a history of IBS, GERD, diverticulosis, hyperlipidemia who presents with abdominal pain and vomiting. Patient reports onset of symptoms at approximately 2:30 this morning. Per the patient's husband, she has vomited every 20 minutes since 2:30. She's taken 2 doses of Phenergan without any relief. Patient reports lower crampy abdominal pain that is nonradiating. Currently her pain is 4/10.  She is actively vomiting in front of me. No blood noted. Patient denies any diarrhea and states "it some as the opposite." Last normal bowel movement was yesterday. She denies any fevers or sick contacts. The patient's husband states that they "bad [place] last night." No one else is sick.  Patient also reports a 2 to three-day history of "bad heartburn." She reports burning substernal chest and epigastric discomfort consistent with her GERD.   Past Medical History  Diagnosis Date  . DUB (dysfunctional uterine bleeding)   . Cataracts, bilateral   . Degenerative disc disease   . Bunion   . Ankle fracture     Stress fracture  . Lacunar stroke   . IBS (irritable bowel syndrome)   . Depression   . Anxiety   . GERD (gastroesophageal reflux disease)   . Diverticulosis of colon (without mention of hemorrhage) 2010    Colonoscopy  . Stricture and stenosis of esophagus 2005,2010    EGD   . Anal polyp 1998    Flex Sig   . Internal hemorrhoids without mention of complication 0000000    Colonoscopy   . External hemorrhoids 2000    Colonoscopy  . Family history of malignant neoplasm of gastrointestinal tract   . Hyperlipemia   . Fibromyalgia   . Hiatal hernia 2005,2010    EGD  .  Thyroid disease     Hypothyroid  . Allergy   . Anemia   . Asthma     border line has inhaler  . Stroke   . Irregular heart beat   . Pseudophakia, both eyes   . Angular blepharitis of left eye   . Retinal scar     left  . PCO (posterior capsular opacification)     left  . Dry eyes     bilateral  . Corneal scar     left eye  . PVD (posterior vitreous detachment) right  . Rotator cuff disorder     pain, left shoulder  . Migraine   . Anxiety   . IBS (irritable bowel syndrome)    Past Surgical History  Procedure Laterality Date  . Total abdominal hysterectomy w/ bilateral salpingoophorectomy  1991    TAH BSO  . Tonsillectomy    . Breast lumpectomy Left     Benign  . Replacement total knee Right 2011  . Eye surgery Left   . Total knee arthroplasty      both  . Cataract extraction Bilateral 2013  . Nasal septum surgery    . Mouth surgery  11/13/11    Cyst removed from gum-benign   . Colonoscopy    . Nasal septum surgery  1970's  . Esophageal dilation     Family History  Problem Relation Age of Onset  . Dementia Mother   . Irritable bowel syndrome  Mother   . Hypertension Father   . Congestive Heart Failure Father   . Diabetes Sister   . Colon cancer Paternal Aunt   . Esophageal cancer Neg Hx   . Rectal cancer Neg Hx   . Stomach cancer Neg Hx   . Heart disease Maternal Grandmother   . Alcohol abuse Father   . Alzheimer's disease Mother   . Heart attack Paternal Grandfather   . Heart attack Maternal Grandmother   . Heart attack Maternal Grandfather   . Arthritis Brother   . Other Sister     pituitary disease  . Allergies Sister   . Other Sister     joint problems  . Stroke Mother    History  Substance Use Topics  . Smoking status: Former Research scientist (life sciences)  . Smokeless tobacco: Never Used     Comment: Stopped smoking age 33  . Alcohol Use: No   OB History   Grav Para Term Preterm Abortions TAB SAB Ect Mult Living   0              Review of Systems   Constitutional: Positive for chills. Negative for fever.  Respiratory: Negative for cough, chest tightness and shortness of breath.   Cardiovascular: Positive for chest pain.  Gastrointestinal: Positive for nausea, vomiting and abdominal pain. Negative for diarrhea and blood in stool.  Genitourinary: Negative for dysuria.  Musculoskeletal: Negative for back pain.  Skin: Negative for rash.  Neurological: Negative for headaches.  Psychiatric/Behavioral: Negative for confusion.  All other systems reviewed and are negative.      Allergies  Erythromycin; Atorvastatin calcium; Augmentin; Clindamycin/lincomycin; Ketoconazole; Morphine sulfate; Septra; and Simvastatin  Home Medications   Current Outpatient Rx  Name  Route  Sig  Dispense  Refill  . levofloxacin (LEVAQUIN) 500 MG tablet   Oral   Take 500 mg by mouth daily.         . Albuterol Sulfate (PROAIR HFA IN)   Inhalation   Inhale into the lungs as needed.          Marland Kitchen aspirin 81 MG tablet   Oral   Take 81 mg by mouth daily.         . Calcium Carbonate-Vitamin D (CALCIUM + D PO)   Oral   Take by mouth.         . Cholecalciferol (VITAMIN D PO)   Oral   Take 50,000 mg by mouth daily.          Marland Kitchen desonide (DESOWEN) 0.05 % cream   Topical   Apply topically 2 (two) times daily. Corner on left eye         . Docusate Calcium (STOOL SOFTENER PO)   Oral   Take by mouth as needed.         . fluticasone (VERAMYST) 27.5 MCG/SPRAY nasal spray   Nasal   Place 2 sprays into the nose as needed.          . hydrocortisone (ANUSOL-HC) 25 MG suppository   Rectal   Place 1 suppository (25 mg total) rectally at bedtime.   12 suppository   0   . levothyroxine (SYNTHROID, LEVOTHROID) 100 MCG tablet   Oral   Take 100 mcg by mouth daily before breakfast.         . Linaclotide (LINZESS) 145 MCG CAPS   Oral   Take 1 capsule (145 mcg total) by mouth daily.   30 capsule   6   . Loratadine (CLARITIN PO)  Oral    Take by mouth.         . Multiple Vitamin (MULTIVITAMIN) tablet   Oral   Take 1 tablet by mouth daily. Chewable         . nystatin ointment (MYCOSTATIN)   Topical   Apply topically as needed.   30 g   1   . omeprazole (PRILOSEC) 20 MG capsule      TAKE ONE CAPSULE BY MOUTH EVERY DAY   30 capsule   6   . Polyethylene Glycol 3350 (MIRALAX PO)   Oral   Take by mouth.         . Probiotic Product (ALIGN PO)   Oral   Take by mouth.         . RESTASIS 0.05 % ophthalmic emulsion      Uses as directed         . rosuvastatin (CRESTOR) 5 MG tablet   Oral   Take 10 mg by mouth as directed.          . Thiamine Mononitrate (VITAMIN B1 PO)   Oral   Take 1 capsule by mouth daily.         . vitamin C (ASCORBIC ACID) 500 MG tablet   Oral   Take 500 mg by mouth daily. Chewable          BP 112/54  Pulse 103  Temp(Src) 98.1 F (36.7 C) (Oral)  Resp 21  Ht 5\' 8"  (1.727 m)  Wt 175 lb (79.379 kg)  BMI 26.61 kg/m2  SpO2 100% Physical Exam  Nursing note and vitals reviewed. Constitutional: She is oriented to person, place, and time. No distress.  Ill-appearing, actively vomiting  HENT:  Head: Normocephalic and atraumatic.  Mucous membranes dry  Eyes: Pupils are equal, round, and reactive to light.  Neck: Neck supple.  Cardiovascular: Normal rate, regular rhythm and normal heart sounds.   No murmur heard. Pulmonary/Chest: Effort normal and breath sounds normal. No respiratory distress. She has no wheezes.  Abdominal: Soft. Bowel sounds are normal. She exhibits no mass. There is no tenderness. There is no rebound and no guarding.  Musculoskeletal: She exhibits no edema.  Neurological: She is alert and oriented to person, place, and time.  Skin: Skin is warm and dry.  Psychiatric: She has a normal mood and affect.    ED Course  Procedures (including critical care time) Labs Review Labs Reviewed  CBC WITH DIFFERENTIAL - Abnormal; Notable for the following:     WBC 18.4 (*)    Neutrophils Relative % 91 (*)    Neutro Abs 16.8 (*)    Lymphocytes Relative 4 (*)    All other components within normal limits  COMPREHENSIVE METABOLIC PANEL - Abnormal; Notable for the following:    Glucose, Bld 137 (*)    GFR calc non Af Amer 85 (*)    All other components within normal limits  LIPASE, BLOOD - Abnormal; Notable for the following:    Lipase 859 (*)    All other components within normal limits  URINALYSIS, ROUTINE W REFLEX MICROSCOPIC - Abnormal; Notable for the following:    Hgb urine dipstick SMALL (*)    All other components within normal limits  URINE MICROSCOPIC-ADD ON - Abnormal; Notable for the following:    Bacteria, UA FEW (*)    All other components within normal limits  TROPONIN I  I-STAT CG4 LACTIC ACID, ED   Imaging Review Ct Abdomen Pelvis W Contrast  07/22/2013  CLINICAL DATA:  Abdominal pain, nausea and vomiting  EXAM: CT ABDOMEN AND PELVIS WITH CONTRAST  TECHNIQUE: Multidetector CT imaging of the abdomen and pelvis was performed using the standard protocol following bolus administration of intravenous contrast.  CONTRAST:  61mL OMNIPAQUE IOHEXOL 300 MG/ML SOLN, 17mL OMNIPAQUE IOHEXOL 300 MG/ML SOLN  COMPARISON:  07/22/2013 abdominal radiographs  FINDINGS: Minor basilar atelectasis. Normal heart size. No pericardial or pleural effusion. No significant hiatal hernia.  Abdomen: Liver, gallbladder, biliary system, pancreas, spleen, adrenal glands, and kidneys are within normal limits for age and demonstrate no acute process. Incidental cortical atrophy of the left kidney upper and midpole regions.  No abdominal free fluid, fluid collection, hemorrhage, abscess, or adenopathy.  Aortic atherosclerosis without aneurysm. No dissection. No retroperitoneal abnormality.  Negative for bowel obstruction, dilatation, ileus, or free air. Ingested contrast is retained within the stomach which can be seen with delayed gastric emptying or gastroparesis.   Normal appendix demonstrated.  Pelvis: Prior hysterectomy. No pelvic free fluid, fluid collection, hemorrhage, abscess, adenopathy, inguinal abnormality, or hernia. No acute distal bowel process. Several loops of small bowel all are mildly distended with fluid, nonspecific. Negative for bowel wall thickening or mesenteric abnormality.  Diffuse degenerative changes noted of the spine.  IMPRESSION: No acute intra-abdominal or pelvic finding by CT.  Ingested oral contrast remains in the stomach which can be seen with delayed gastric emptying or gastroparesis.   Electronically Signed   By: Daryll Brod M.D.   On: 07/22/2013 11:36   Dg Abd Acute W/chest  07/22/2013   CLINICAL DATA:  Lower abdominal pain.  Nausea and vomiting.  EXAM: ACUTE ABDOMEN SERIES (ABDOMEN 2 VIEW & CHEST 1 VIEW)  COMPARISON:  Chest radiograph on 09/02/2009  FINDINGS: There is no evidence of dilated bowel loops or free intraperitoneal air. No radiopaque calculi or other significant radiographic abnormality is seen. Heart size and mediastinal contours are within normal limits. Both lungs are clear.  IMPRESSION: Negative abdominal radiographs.  No acute cardiopulmonary disease.   Electronically Signed   By: Earle Gell M.D.   On: 07/22/2013 09:50    EKG Interpretation    Date/Time:  Sunday July 22 2013 08:58:38 EST Ventricular Rate:  100 PR Interval:  196 QRS Duration: 90 QT Interval:  360 QTC Calculation: 464 R Axis:   -33 Text Interpretation:  Normal sinus rhythm Left axis deviation Nonspecific ST abnormality Abnormal ECG Tachycardia when compared to prior Confirmed by HORTON  MD, Startup ([id number]) on 07/22/2013 9:40:46 AM            MDM   Final diagnoses:  Acute pancreatitis    9:59 AM Patient noted to have a leukocytosis to 18. Vomiting improved with Zofran. Acute abdominal series negative. Will obtain CT scan.  Patient noted to have elevated lipase. CT scan is negative but does show evidence of gastroparesis  or delayed gastric emptying. Patient has continued to have vomiting. She denies a history of alcohol abuse, gallstones.  She does have a history of hyperlipidemia.  Given her age, white count, persistent vomiting - patient will be admitted for further management.  Merryl Hacker, MD 07/22/13 [phone]

## 2013-07-22 NOTE — H&P (Addendum)
PCP:   Mathews Argyle, MD   Chief Complaint:  Nausea and vomiting  HPI: 73 year old female [facility] with the chief complaint of nausea, vomiting and abdominal pain which started this morning around 2 AM.patient says the abdominal pain was intermittent, in fact she was having intermittent abdominal pain radiating to her back for last 2 weeks. She also has been having retching, nausea vomiting. She denies any fever but admits to having some chills. She denies dysuria urgency or frequency of urination. She denies chest pain no shortness of breath no cough. Patient denies drinking alcohol recently, she also denies history of gallstones.CT scan of the abdomen was done which showed no acute finding. Her labs revealed elevated lipase and leukocytosis.  Allergies:   Allergies  Allergen Reactions  . Erythromycin Hives  . Atorvastatin Calcium [Atorvastatin] Other (See Comments)    Muscle weakness   . Augmentin [Amoxicillin-Pot Clavulanate] Nausea And Vomiting  . Clindamycin/Lincomycin     Stomach upset   . Ketoconazole Nausea And Vomiting  . Morphine Sulfate Other (See Comments)    Unknown   . Septra [Bactrim]     Upset stomach  . Simvastatin     Muscle pain      Past Medical History  Diagnosis Date  . DUB (dysfunctional uterine bleeding)   . Cataracts, bilateral   . Degenerative disc disease   . Bunion   . Ankle fracture     Stress fracture  . Lacunar stroke   . IBS (irritable bowel syndrome)   . Depression   . Anxiety   . GERD (gastroesophageal reflux disease)   . Diverticulosis of colon (without mention of hemorrhage) 2010    Colonoscopy  . Stricture and stenosis of esophagus 2005,2010    EGD   . Anal polyp 1998    Flex Sig   . Internal hemorrhoids without mention of complication [id number]    Colonoscopy   . External hemorrhoids 2000    Colonoscopy  . Family history of malignant neoplasm of gastrointestinal tract   . Hyperlipemia   .  Fibromyalgia   . Hiatal hernia 2005,2010    EGD  . Thyroid disease     Hypothyroid  . Allergy   . Anemia   . Asthma     border line has inhaler  . Stroke   . Irregular heart beat   . Pseudophakia, both eyes   . Angular blepharitis of left eye   . Retinal scar     left  . PCO (posterior capsular opacification)     left  . Dry eyes     bilateral  . Corneal scar     left eye  . PVD (posterior vitreous detachment) right  . Rotator cuff disorder     pain, left shoulder  . Migraine   . Anxiety   . IBS (irritable bowel syndrome)     Past Surgical History  Procedure Laterality Date  . Total abdominal hysterectomy w/ bilateral salpingoophorectomy  1991    TAH BSO  . Tonsillectomy    . Breast lumpectomy Left     Benign  . Replacement total knee Right 2011  . Eye surgery Left   . Total knee arthroplasty      both  . Cataract extraction Bilateral 2013  . Nasal septum surgery    . Mouth surgery  11/13/11    Cyst removed from gum-benign   . Colonoscopy    . Nasal septum surgery  1970's  .  Esophageal dilation    . Abdominal hysterectomy      Prior to Admission medications   Medication Sig Start Date End Date Taking? Authorizing Provider  Albuterol Sulfate (PROAIR HFA IN) Inhale 1-2 puffs into the lungs as needed (Wheezing).    Yes Historical Provider, MD  aspirin 81 MG tablet Take 81 mg by mouth daily.   Yes Historical Provider, MD  Calcium Carbonate-Vitamin D (CALCIUM + D PO) Take 1 tablet by mouth daily.    Yes Historical Provider, MD  desonide (DESOWEN) 0.05 % cream Apply 1 application topically 2 (two) times daily. Corner on left eye   Yes Historical Provider, MD  Docusate Calcium (STOOL SOFTENER PO) Take 1 capsule by mouth as needed (constipation).    Yes Historical Provider, MD  fluticasone (FLONASE) 50 MCG/ACT nasal spray Place 1 spray into both nostrils daily.  06/06/13  Yes Historical Provider, MD  levofloxacin (LEVAQUIN) 500 MG tablet Take 500 mg by mouth daily.    Yes Historical Provider, MD  levothyroxine (SYNTHROID, LEVOTHROID) 100 MCG tablet Take 100 mcg by mouth daily before breakfast.   Yes Historical Provider, MD  Multiple Vitamin (MULTIVITAMIN) tablet Take 1 tablet by mouth daily. Chewable   Yes Historical Provider, MD  omeprazole (PRILOSEC) 20 MG capsule Take 20 mg by mouth daily.   Yes Historical Provider, MD  Polyethyl Glycol-Propyl Glycol (SYSTANE FREE OP) Apply 1 drop to eye daily.   Yes Historical Provider, MD  Polyethylene Glycol 3350 (MIRALAX PO) Take 17 g by mouth daily as needed (constipation).    Yes Historical Provider, MD  RESTASIS 0.05 % ophthalmic emulsion Place 1 drop into both eyes 2 (two) times daily. Uses as directed 09/30/12  Yes Historical Provider, MD  rosuvastatin (CRESTOR) 5 MG tablet Take 10 mg by mouth as directed.    Yes Historical Provider, MD  Thiamine Mononitrate (VITAMIN B1 PO) Take 1 capsule by mouth daily.   Yes Historical Provider, MD    Social History:  reports that she has quit smoking. She has never used smokeless tobacco. She reports that she does not drink alcohol or use illicit drugs.  Family History  Problem Relation Age of Onset  . Dementia Mother   . Irritable bowel syndrome Mother   . Hypertension Father   . Congestive Heart Failure Father   . Diabetes Sister   . Colon cancer Paternal Aunt   . Esophageal cancer Neg Hx   . Rectal cancer Neg Hx   . Stomach cancer Neg Hx   . Heart disease Maternal Grandmother   . Alcohol abuse Father   . Alzheimer's disease Mother   . Heart attack Paternal Grandfather   . Heart attack Maternal Grandmother   . Heart attack Maternal Grandfather   . Arthritis Brother   . Other Sister     pituitary disease  . Allergies Sister   . Other Sister     joint problems  . Stroke Mother      All the positives are listed in BOLD  Review of Systems:  HEENT: Headache, blurred vision, runny nose, sore throat Neck: Hypothyroidism, hyperthyroidism,,lymphadenopathy Chest  : Shortness of breath, history of COPD, Asthma Heart : Chest pain, history of coronary arterey disease GI:  Nausea, vomiting, diarrhea, constipation, GERD GU: Dysuria, urgency, frequency of urination, hematuria Neuro: Stroke, seizures, syncope Psych: Depression, anxiety, hallucinations   Physical Exam: Blood pressure 109/67, pulse 103, temperature 98.6 F (37 C), temperature source Oral, resp. rate 18, height _0  (1.727 m), weight 79.379  kg (175 lb), SpO2 96.00%. Constitutional:   Patient is a well-developed and well-nourished female* in no acute distress and cooperative with exam. Head: Normocephalic and atraumatic Mouth: Mucus membranes moist Eyes: PERRL, EOMI, conjunctivae normal Neck: Supple, No Thyromegaly Cardiovascular: RRR, S1 normal, S2 normal Pulmonary/Chest: CTAB, no wheezes, rales, or rhonchi Abdominal: Soft. Non-tender, non-distended, bowel sounds are normal, no masses, organomegaly, or guarding present.  Neurological: A&O x3, Strenght is normal and symmetric bilaterally, cranial nerve II-XII are grossly intact, no focal motor deficit, sensory intact to light touch bilaterally.  Extremities : No Cyanosis, Clubbing or Edema   Labs on Admission:  Results for orders placed during the hospital encounter of 07/22/13 (from the past 48 hour(s))  CBC WITH DIFFERENTIAL     Status: Abnormal   Collection Time    07/22/13  8:50 AM      Result Value Ref Range   WBC 18.4 (*) 4.0 - 10.5 K/uL   RBC 4.51  3.87 - 5.11 MIL/uL   Hemoglobin 13.9  12.0 - 15.0 g/dL   HCT 41.0  36.0 - 46.0 %   MCV 90.9  78.0 - 100.0 fL   MCH 30.8  26.0 - 34.0 pg   MCHC 33.9  30.0 - 36.0 g/dL   RDW 13.1  11.5 - 15.5 %   Platelets 191  150 - 400 K/uL   Neutrophils Relative % 91 (*) 43 - 77 %   Neutro Abs 16.8 (*) 1.7 - 7.7 K/uL   Lymphocytes Relative 4 (*) 12 - 46 %   Lymphs Abs 0.7  0.7 - 4.0 K/uL   Monocytes Relative 5  3 - 12 %   Monocytes Absolute 0.8  0.1 - 1.0 K/uL   Eosinophils Relative 1  0 -  5 %   Eosinophils Absolute 0.1  0.0 - 0.7 K/uL   Basophils Relative 0  0 - 1 %   Basophils Absolute 0.0  0.0 - 0.1 K/uL  COMPREHENSIVE METABOLIC PANEL     Status: Abnormal   Collection Time    07/22/13  8:50 AM      Result Value Ref Range   Sodium 140  137 - 147 mEq/L   Potassium 3.8  3.7 - 5.3 mEq/L   Chloride 102  96 - 112 mEq/L   CO2 24  19 - 32 mEq/L   Glucose, Bld 137 (*) 70 - 99 mg/dL   BUN 21  6 - 23 mg/dL   Creatinine, Ser 0.70  0.50 - 1.10 mg/dL   Calcium 9.0  8.4 - 10.5 mg/dL   Total Protein 6.9  6.0 - 8.3 g/dL   Albumin 3.7  3.5 - 5.2 g/dL   AST 24  0 - 37 U/L   ALT 18  0 - 35 U/L   Alkaline Phosphatase 50  39 - 117 U/L   Total Bilirubin 0.4  0.3 - 1.2 mg/dL   GFR calc non Af Amer 85 (*) >90 mL/min   GFR calc Af Amer >90  >90 mL/min   Comment: (NOTE)     The eGFR has been calculated using the CKD EPI equation.     This calculation has not been validated in all clinical situations.     eGFR's persistently <90 mL/min signify possible Chronic Kidney     Disease.  LIPASE, BLOOD     Status: Abnormal   Collection Time    07/22/13  8:50 AM      Result Value Ref Range   Lipase 859 (*)  11 - 59 U/L  TROPONIN I     Status: None   Collection Time    07/22/13  8:50 AM      Result Value Ref Range   Troponin I <0.30  <0.30 ng/mL   Comment:            Due to the release kinetics of cTnI,     a negative result within the first hours     of the onset of symptoms does not rule out     myocardial infarction with certainty.     If myocardial infarction is still suspected,     repeat the test at appropriate intervals.  I-STAT CG4 LACTIC ACID, ED     Status: None   Collection Time    07/22/13  9:01 AM      Result Value Ref Range   Lactic Acid, Venous 1.41  0.5 - 2.2 mmol/L  URINALYSIS, ROUTINE W REFLEX MICROSCOPIC     Status: Abnormal   Collection Time    07/22/13  9:47 AM      Result Value Ref Range   Color, Urine YELLOW  YELLOW   APPearance CLEAR  CLEAR   Specific  Gravity, Urine 1.014  1.005 - 1.030   pH 7.5  5.0 - 8.0   Glucose, UA NEGATIVE  NEGATIVE mg/dL   Hgb urine dipstick SMALL (*) NEGATIVE   Bilirubin Urine NEGATIVE  NEGATIVE   Ketones, ur NEGATIVE  NEGATIVE mg/dL   Protein, ur NEGATIVE  NEGATIVE mg/dL   Urobilinogen, UA 0.2  0.0 - 1.0 mg/dL   Nitrite NEGATIVE  NEGATIVE   Leukocytes, UA NEGATIVE  NEGATIVE  URINE MICROSCOPIC-ADD ON     Status: Abnormal   Collection Time    07/22/13  9:47 AM      Result Value Ref Range   Squamous Epithelial / LPF RARE  RARE   RBC / HPF 7-10  <3 RBC/hpf   Bacteria, UA FEW (*) RARE   Urine-Other MUCOUS PRESENT      Radiological Exams on Admission: Ct Abdomen Pelvis W Contrast  07/22/2013   CLINICAL DATA:  Abdominal pain, nausea and vomiting  EXAM: CT ABDOMEN AND PELVIS WITH CONTRAST  TECHNIQUE: Multidetector CT imaging of the abdomen and pelvis was performed using the standard protocol following bolus administration of intravenous contrast.  CONTRAST:  38m OMNIPAQUE IOHEXOL 300 MG/ML SOLN, 1049mOMNIPAQUE IOHEXOL 300 MG/ML SOLN  COMPARISON:  07/22/2013 abdominal radiographs  FINDINGS: Minor basilar atelectasis. Normal heart size. No pericardial or pleural effusion. No significant hiatal hernia.  Abdomen: Liver, gallbladder, biliary system, pancreas, spleen, adrenal glands, and kidneys are within normal limits for age and demonstrate no acute process. Incidental cortical atrophy of the left kidney upper and midpole regions.  No abdominal free fluid, fluid collection, hemorrhage, abscess, or adenopathy.  Aortic atherosclerosis without aneurysm. No dissection. No retroperitoneal abnormality.  Negative for bowel obstruction, dilatation, ileus, or free air. Ingested contrast is retained within the stomach which can be seen with delayed gastric emptying or gastroparesis.  Normal appendix demonstrated.  Pelvis: Prior hysterectomy. No pelvic free fluid, fluid collection, hemorrhage, abscess, adenopathy, inguinal abnormality,  or hernia. No acute distal bowel process. Several loops of small bowel all are mildly distended with fluid, nonspecific. Negative for bowel wall thickening or mesenteric abnormality.  Diffuse degenerative changes noted of the spine.  IMPRESSION: No acute intra-abdominal or pelvic finding by CT.  Ingested oral contrast remains in the stomach which can be seen with delayed gastric emptying  or gastroparesis.   Electronically Signed   By: Daryll Brod M.D.   On: 07/22/2013 11:36   Dg Abd Acute W/chest  07/22/2013   CLINICAL DATA:  Lower abdominal pain.  Nausea and vomiting.  EXAM: ACUTE ABDOMEN SERIES (ABDOMEN 2 VIEW & CHEST 1 VIEW)  COMPARISON:  Chest radiograph on 09/02/2009  FINDINGS: There is no evidence of dilated bowel loops or free intraperitoneal air. No radiopaque calculi or other significant radiographic abnormality is seen. Heart size and mediastinal contours are within normal limits. Both lungs are clear.  IMPRESSION: Negative abdominal radiographs.  No acute cardiopulmonary disease.   Electronically Signed   By: Earle Gell M.D.   On: 07/22/2013 09:50    Assessment/Plan Principal Problem:   Pancreatitis Active Problems:   HYPOTHYROIDISM   GERD   Elevated cholesterol   Leukocytosis, unspecified  Acute pancreatitis ? Cause, patient does not drink alcohol, no history of gallstones. Her liver enzymes have been normal. Patient does have a history of hyper-lipidemia and has been taking Crestor.Recently she took antibiotics Levaquin, Zithromax, prednisone, Tamiflu over the past 4 weeks for repeated upper respiratory infection along with influenza.? Drug-induced Will check lipid profile in a.m. Will check abdominal ultrasound in a.m.,consider GI consultation if CBD is dilated. We'll keep her n.p.o. Phenergan for nausea vomiting We'll repeat lipase in the morning We'll give morphine when necessary for pain  Leukocytosis Likely reactive. Or secondary to steroids  She has been afebrile,UA  shows mild RBCs, no other abnormality, she has a history of microscopic hematuria in the past. Chest x-ray shows no infiltrate Will check WBC in the morning  GERD Continue proximal  DVT prophylaxis Lovenox   Code status:patient is full code  Family discussion:discussed with patient's husband at bedside.   Time Spent on Admission: 70 minutes   [facility] Pager: [phone] 07/22/2013, 6:52 PM  If 7PM-7AM, please contact night-coverage  [email or web address]  Password [email or web address]

## 2013-07-23 ENCOUNTER — Inpatient Hospital Stay (HOSPITAL_COMMUNITY): Payer: Medicare Other

## 2013-07-23 LAB — CBC
HCT: 38.4 % (ref 36.0–46.0)
Hemoglobin: 12.7 g/dL (ref 12.0–15.0)
MCH: 30.2 pg (ref 26.0–34.0)
MCHC: 33.1 g/dL (ref 30.0–36.0)
MCV: 91.2 fL (ref 78.0–100.0)
Platelets: 166 10*3/uL (ref 150–400)
RBC: 4.21 MIL/uL (ref 3.87–5.11)
RDW: 13.6 % (ref 11.5–15.5)
WBC: 5.7 10*3/uL (ref 4.0–10.5)

## 2013-07-23 LAB — LIPID PANEL
Cholesterol: 124 mg/dL (ref 0–200)
HDL: 68 mg/dL (ref >39)
LDL Cholesterol: 42 mg/dL (ref 0–99)
Total CHOL/HDL Ratio: 1.8 RATIO
Triglycerides: 69 mg/dL (ref <150)
VLDL: 14 mg/dL (ref 0–40)

## 2013-07-23 LAB — COMPREHENSIVE METABOLIC PANEL
ALT: 16 U/L (ref 0–35)
AST: 23 U/L (ref 0–37)
Albumin: 3 g/dL — ABNORMAL LOW (ref 3.5–5.2)
Alkaline Phosphatase: 41 U/L (ref 39–117)
BUN: 15 mg/dL (ref 6–23)
CO2: 22 mEq/L (ref 19–32)
Calcium: 8.6 mg/dL (ref 8.4–10.5)
Chloride: 106 mEq/L (ref 96–112)
Creatinine, Ser: 0.73 mg/dL (ref 0.50–1.10)
GFR calc Af Amer: >90 mL/min (ref >90)
GFR calc non Af Amer: 83 mL/min — ABNORMAL LOW (ref >90)
Glucose, Bld: 87 mg/dL (ref 70–99)
Potassium: 3.5 mEq/L — ABNORMAL LOW (ref 3.7–5.3)
Sodium: 141 mEq/L (ref 137–147)
Total Bilirubin: 0.4 mg/dL (ref 0.3–1.2)
Total Protein: 6 g/dL (ref 6.0–8.3)

## 2013-07-23 LAB — LIPASE, BLOOD: Lipase: 30 U/L (ref 11–59)

## 2013-07-23 MED ORDER — PROCHLORPERAZINE MALEATE 5 MG PO TABS
5.0000 mg | ORAL_TABLET | Freq: Four times a day (QID) | ORAL | Status: DC | PRN
  Filled 2013-07-23: qty 1

## 2013-07-23 MED ORDER — POTASSIUM CHLORIDE CRYS ER 20 MEQ PO TBCR
40.0000 meq | EXTENDED_RELEASE_TABLET | Freq: Once | ORAL | Status: AC
Start: 2013-07-23 — End: 2013-07-23
  Administered 2013-07-23: 40 meq via ORAL
  Filled 2013-07-23 (×2): qty 2

## 2013-07-23 MED ORDER — ONDANSETRON HCL 4 MG/2ML IJ SOLN: 4.0000 mg | Freq: Four times a day (QID) | INTRAMUSCULAR | Status: DC | PRN

## 2013-07-23 MED ORDER — SODIUM CHLORIDE 0.9 % IV SOLN
INTRAVENOUS | Status: AC
  Administered 2013-07-23 (×2): via INTRAVENOUS

## 2013-07-23 MED ORDER — PANTOPRAZOLE SODIUM 40 MG PO TBEC
40.0000 mg | DELAYED_RELEASE_TABLET | Freq: Every day | ORAL | Status: DC
  Administered 2013-07-23: 40 mg via ORAL
  Filled 2013-07-23: qty 1

## 2013-07-23 NOTE — Progress Notes (Addendum)
PROGRESS NOTE  Kimberly [name] VOJ:[id number] DOB: 14-Sep-1940 DOA: 07/22/2013 PCP: Mathews Argyle, MD  HPI/Subjective: 73 yo female presented to the ED with intermittent abdominal pain radiating to her back for 2 weeks. More recently patient developed nausea and vomiting. Patient denies fever, chest pain, and SOB. She does not drink alcohol and does not have a history of gallstones. Labs revealed a lipase of 859 on 2/22.  This morning patient is resting comfortably and feeling well. Lipase is improved to 30. No nausea, vomiting or abdominal pain overnight. Abdominal ultrasound done this morning; results pending. Advanced diet to clears.   Assessment/Plan: Pancreatitis -Elevated lipase to 859 on 2/22 -Lipase stable today -Abdominal u/s 2/23 negative for Gall stones.  Normal exam. -Continue antiemetics and pain medication prn  Hyperlipidemia -Stable -Continue to monitor lipid panel  GERD -Stable -Continue omeprazole  Hypothyroidism -Stable -Continue levothyroxine  Leukocytosis -WBC to 18.4 on 2/22 -On 2/23 leukocytosis appears resolved (5.7) -Continue to monitor   Low K - replaced, check in am      DVT Prophylaxis:  Lovenox  Code Status: Full code Family Communication: Patient alert and communicative. Husband at bedside and aware of plan. Disposition Plan: Inpatient   Consultants:  None  Procedures:  None  Antibiotics:  None  Objective: Filed Vitals:   07/22/13 1600 07/22/13 1837 07/22/13 2205 07/23/13 0629  BP: 110/64 109/67 105/64 106/70  Pulse: 100 103 92 80  Temp:  98.6 F (37 C) 98.5 F (36.9 C) 97.4 F (36.3 C)  TempSrc:  Oral Oral Oral  Resp: 21 18 17 17   Height:  5\' 8"  (1.727 m)    Weight:  79.379 kg (175 lb)    SpO2: 93% 96% 92% 92%    Intake/Output Summary (Last 24 hours) at 07/23/13 1028 Last data filed at 07/23/13 0000  Gross per 24 hour  Intake    240 ml  Output    950 ml  Net   -710 ml   Filed Weights   07/22/13  0844 07/22/13 1837  Weight: 79.379 kg (175 lb) 79.379 kg (175 lb)    Exam: General: Well developed, well nourished, NAD, appears stated age  89:  PERR Neck: Supple, no JVD, no masses  Cardiovascular: RRR, S1 S2 auscultated, no rubs, murmurs or gallops.   Respiratory: Clear to auscultation bilaterally with equal chest rise  Abdomen: Soft, nontender, nondistended, + bowel sounds  Extremities: warm dry without cyanosis clubbing or edema. Neuro: AAOx3. Strength 5/5 in upper and lower extremities  Skin: Without rash, normal color and pallor. Psych: Normal affect and demeanor with intact judgement and insight.    Data Reviewed: Basic Metabolic Panel:  Recent Labs Lab 07/22/13 0850 07/22/13 1958 07/23/13 0632  NA 140  --  141  K 3.8  --  3.5*  CL 102  --  106  CO2 24  --  22  GLUCOSE 137*  --  87  BUN 21  --  15  CREATININE 0.70 0.63 0.73  CALCIUM 9.0  --  8.6   Liver Function Tests:  Recent Labs Lab 07/22/13 0850 07/23/13 0632  AST 24 23  ALT 18 16  ALKPHOS 50 41  BILITOT 0.4 0.4  PROT 6.9 6.0  ALBUMIN 3.7 3.0*    Recent Labs Lab 07/22/13 0850 07/23/13 0632  LIPASE 859* 30  CBC:  Recent Labs Lab 07/22/13 0850 07/22/13 1958 07/23/13 0632  WBC 18.4* 10.0 5.7  NEUTROABS 16.8*  --   --   HGB 13.9  12.4 12.7  HCT 41.0 37.1 38.4  MCV 90.9 90.7 91.2  PLT 191 157 166   Cardiac Enzymes:  Recent Labs Lab 07/22/13 0850  TROPONINI <0.30    Studies: US Abdomen Complete  07/23/2013   CLINICAL DATA:  Pancreatitis  EXAM: ULTRASOUND ABDOMEN COMPLETE  COMPARISON:  CT abdomen and pelvis 07/22/2013  FINDINGS: Gallbladder:  Well distended without calculi, wall thickening, pericholecystic fluid or sonographic Murphy sign.  Common bile duct:  Diameter: Normal caliber 5 mm diameter  Liver:  Normal appearance  IVC:  Normal appearance  Pancreas:  Normal appearance  Spleen:  Normal appearance, 7.4 cm length  Right Kidney:  Length: 12.2 cm.  Normal morphology without mass  or hydronephrosis.  Left Kidney:  Length: 10.6 cm.  Normal morphology without mass or hydronephrosis.  Abdominal aorta:  Normal caliber  Other findings:  No free-fluid  IMPRESSION: Normal upper abdominal ultrasound.   Electronically Signed   By: Lavonia Dana M.D.   On: 07/23/2013 10:02   Ct Abdomen Pelvis W Contrast  07/22/2013   CLINICAL DATA:  Abdominal pain, nausea and vomiting  EXAM: CT ABDOMEN AND PELVIS WITH CONTRAST  TECHNIQUE: Multidetector CT imaging of the abdomen and pelvis was performed using the standard protocol following bolus administration of intravenous contrast.  CONTRAST:  51mL OMNIPAQUE IOHEXOL 300 MG/ML SOLN, 19mL OMNIPAQUE IOHEXOL 300 MG/ML SOLN  COMPARISON:  07/22/2013 abdominal radiographs  FINDINGS: Minor basilar atelectasis. Normal heart size. No pericardial or pleural effusion. No significant hiatal hernia.  Abdomen: Liver, gallbladder, biliary system, pancreas, spleen, adrenal glands, and kidneys are within normal limits for age and demonstrate no acute process. Incidental cortical atrophy of the left kidney upper and midpole regions.  No abdominal free fluid, fluid collection, hemorrhage, abscess, or adenopathy.  Aortic atherosclerosis without aneurysm. No dissection. No retroperitoneal abnormality.  Negative for bowel obstruction, dilatation, ileus, or free air. Ingested contrast is retained within the stomach which can be seen with delayed gastric emptying or gastroparesis.  Normal appendix demonstrated.  Pelvis: Prior hysterectomy. No pelvic free fluid, fluid collection, hemorrhage, abscess, adenopathy, inguinal abnormality, or hernia. No acute distal bowel process. Several loops of small bowel all are mildly distended with fluid, nonspecific. Negative for bowel wall thickening or mesenteric abnormality.  Diffuse degenerative changes noted of the spine.  IMPRESSION: No acute intra-abdominal or pelvic finding by CT.  Ingested oral contrast remains in the stomach which can be seen  with delayed gastric emptying or gastroparesis.   Electronically Signed   By: Daryll Brod M.D.   On: 07/22/2013 11:36   Dg Abd Acute W/chest  07/22/2013   CLINICAL DATA:  Lower abdominal pain.  Nausea and vomiting.  EXAM: ACUTE ABDOMEN SERIES (ABDOMEN 2 VIEW & CHEST 1 VIEW)  COMPARISON:  Chest radiograph on 09/02/2009  FINDINGS: There is no evidence of dilated bowel loops or free intraperitoneal air. No radiopaque calculi or other significant radiographic abnormality is seen. Heart size and mediastinal contours are within normal limits. Both lungs are clear.  IMPRESSION: Negative abdominal radiographs.  No acute cardiopulmonary disease.   Electronically Signed   By: Earle Gell M.D.   On: 07/22/2013 09:50    Scheduled Meds: . antiseptic oral rinse  15 mL Mouth Rinse q12n4p  . aspirin  81 mg Oral Daily  . cycloSPORINE  1 drop Both Eyes BID  . enoxaparin (LOVENOX) injection  40 mg Subcutaneous Q24H  . levothyroxine  100 mcg Oral QAC breakfast  . pantoprazole (PROTONIX) IV  40 mg  Intravenous Q24H   Continuous Infusions: . sodium chloride 75 mL/hr at 07/23/13 [id number]    Principal Problem:   Pancreatitis Active Problems:   HYPOTHYROIDISM   GERD   Elevated cholesterol   Leukocytosis, unspecified    Ruben Im PA-S  Imogene Burn, PA-C [facility] Pager [phone]. If 7PM-7AM, please contact night-coverage at [email or web address], password [facility] 07/23/2013, 10:28 AM  LOS: 1 day     I have directly reviewed the clinical findings, lab, imaging studies and management of this patient in detail. I have interviewed and examined the patient and agree with the documentation,  as recorded by the Physician extender.  Thurnell Lose M.D on 07/23/2013 at 12:12 PM  [facility] Office  [phone]

## 2013-07-23 NOTE — Progress Notes (Signed)
RN noticed that pt does not have any prns ordered for nausea  Or vomiting. Pt currently resting comfortably without complaint. RN paged provider on call to see if prns could be ordered in case of a change in pt.

## 2013-07-23 NOTE — Progress Notes (Signed)
Pt w/o any complaints of nausea, vomiting, or abdominal pain over night.

## 2013-07-24 LAB — COMPREHENSIVE METABOLIC PANEL
ALT: 16 U/L (ref 0–35)
AST: 22 U/L (ref 0–37)
Albumin: 3.2 g/dL — ABNORMAL LOW (ref 3.5–5.2)
Alkaline Phosphatase: 39 U/L (ref 39–117)
BUN: 7 mg/dL (ref 6–23)
CO2: 25 mEq/L (ref 19–32)
Calcium: 9.1 mg/dL (ref 8.4–10.5)
Chloride: 104 mEq/L (ref 96–112)
Creatinine, Ser: 0.63 mg/dL (ref 0.50–1.10)
GFR calc Af Amer: >90 mL/min (ref >90)
GFR calc non Af Amer: 87 mL/min — ABNORMAL LOW (ref >90)
Glucose, Bld: 88 mg/dL (ref 70–99)
Potassium: 3.8 mEq/L (ref 3.7–5.3)
Sodium: 141 mEq/L (ref 137–147)
Total Bilirubin: 0.3 mg/dL (ref 0.3–1.2)
Total Protein: 6.1 g/dL (ref 6.0–8.3)

## 2013-07-24 NOTE — Progress Notes (Signed)
IV site leaking. Paged on-call provider at patient's request regarding placing another IV. Patient requested to leave IV out if okay with physician. On-call provider instructed okay to leave IV out for now. Will continue to monitor.

## 2013-07-24 NOTE — Discharge Instructions (Signed)
Avoid Azithromycin, discuss alternatives to Crestor (can cause pancreatitis)   Follow with Primary MD Mathews Argyle, MD in 7 days   Get CBC, CMP, checked 7 days by Primary MD and again as instructed by your Primary MD.     Activity: As tolerated with Full fall precautions use walker/cane & assistance as needed   Disposition Home     Diet: Heart Healthy   For Heart failure patients - Check your Weight same time everyday, if you gain over 2 pounds, or you develop in leg swelling, experience more shortness of breath or chest pain, call your Primary MD immediately. Follow Cardiac Low Salt Diet and 1.8 lit/day fluid restriction.   On your next visit with her primary care physician please Get Medicines reviewed and adjusted.  Please request your Prim.MD to go over all Hospital Tests and Procedure/Radiological results at the follow up, please get all Hospital records sent to your Prim MD by signing hospital release before you go home.   If you experience worsening of your admission symptoms, develop shortness of breath, life threatening emergency, suicidal or homicidal thoughts you must seek medical attention immediately by calling 911 or calling your MD immediately  if symptoms less severe.  You Must read complete instructions/literature along with all the possible adverse reactions/side effects for all the Medicines you take and that have been prescribed to you. Take any new Medicines after you have completely understood and accpet all the possible adverse reactions/side effects.   Do not drive and provide baby sitting services if your were admitted for syncope or siezures until you have seen by Primary MD or a Neurologist and advised to do so again.  Do not drive when taking Pain medications.    Do not take more than prescribed Pain, Sleep and Anxiety Medications  Special Instructions: If you have smoked or chewed Tobacco  in the last 2 yrs please stop smoking, stop any regular  Alcohol  and or any Recreational drug use.  Wear Seat belts while driving.   Please note  You were cared for by a hospitalist during your hospital stay. If you have any questions about your discharge medications or the care you received while you were in the hospital after you are discharged, you can call the unit and asked to speak with the hospitalist on call if the hospitalist that took care of you is not available. Once you are discharged, your primary care physician will handle any further medical issues. Please note that NO REFILLS for any discharge medications will be authorized once you are discharged, as it is imperative that you return to your primary care physician (or establish a relationship with a primary care physician if you do not have one) for your aftercare needs so that they can reassess your need for medications and monitor your lab values.

## 2013-07-24 NOTE — Plan of Care (Signed)
Problem: Phase I Progression Outcomes Goal: Nausea/vomiting controlled after medication Outcome: Completed/Met Date Met:  07/24/13 Patient has not complained of nausea or vomiting this shift.

## 2013-07-24 NOTE — Progress Notes (Signed)
Patient discharge instructions reviewed with patient.  Patient verbalized understanding using teach back. No question verbalized

## 2013-07-24 NOTE — Discharge Summary (Signed)
Kimberly [name], is a 73 y.o. female  DOB 1940/10/09  MRN [id number].  Admission date:  07/22/2013  Admitting Physician  Charlynne Cousins, MD  Discharge Date:  07/24/2013   Primary MD  Mathews Argyle, MD  Recommendations for primary care physician for things to follow:   Outpt GI eval for pancreatitis, ? Azithromycin and crestor as culprits.   Admission Diagnosis  Acute pancreatitis [577.0]   Discharge Diagnosis  Acute pancreatitis [577.0]     Principal Problem:   Pancreatitis Active Problems:   HYPOTHYROIDISM   GERD   Elevated cholesterol   Leukocytosis, unspecified      Past Medical History  Diagnosis Date  . DUB (dysfunctional uterine bleeding)   . Cataracts, bilateral   . Degenerative disc disease   . Bunion   . Ankle fracture     Stress fracture  . Lacunar stroke   . IBS (irritable bowel syndrome)   . Depression   . Anxiety   . GERD (gastroesophageal reflux disease)   . Diverticulosis of colon (without mention of hemorrhage) 2010    Colonoscopy  . Stricture and stenosis of esophagus 2005,2010    EGD   . Anal polyp 1998    Flex Sig   . Internal hemorrhoids without mention of complication 0000000    Colonoscopy   . External hemorrhoids 2000    Colonoscopy  . Family history of malignant neoplasm of gastrointestinal tract   . Hyperlipemia   . Fibromyalgia   . Hiatal hernia 2005,2010    EGD  . Thyroid disease     Hypothyroid  . Allergy   . Anemia   . Asthma     border line has inhaler  . Stroke   . Irregular heart beat   . Pseudophakia, both eyes   . Angular blepharitis of left eye   . Retinal scar     left  . PCO (posterior capsular opacification)     left  . Dry eyes     bilateral  . Corneal scar     left eye  . PVD (posterior vitreous detachment) right  . Rotator cuff  disorder     pain, left shoulder  . Migraine   . Anxiety   . IBS (irritable bowel syndrome)     Past Surgical History  Procedure Laterality Date  . Total abdominal hysterectomy w/ bilateral salpingoophorectomy  1991    TAH BSO  . Tonsillectomy    . Breast lumpectomy Left     Benign  . Replacement total knee Right 2011  . Eye surgery Left   . Total knee arthroplasty      both  . Cataract extraction Bilateral 2013  . Nasal septum surgery    . Mouth surgery  11/13/11    Cyst removed from gum-benign   . Colonoscopy    . Nasal septum surgery  1970's  . Esophageal dilation    . Abdominal hysterectomy       Discharge Condition: stable   Follow UP  Follow-up Information  Follow up with Mathews Argyle, MD. Schedule an appointment as soon as possible for a visit in 1 week.   Specialty:  Internal Medicine   Contact information:   [address] [place] [address] [phone]       Follow up with Delfin Edis, MD. Schedule an appointment as soon as possible for a visit in 1 week.   Specialty:  Gastroenterology   Contact information:   [address] [place] [address] [phone]         Discharge Instructions  and  Discharge Medications      Discharge Orders   Future Appointments Provider Department Dept Phone   09/14/2013 10:00 AM Anastasio Auerbach, MD [facility] [phone]   Future Orders Complete By Expires   Diet - low sodium heart healthy  As directed    Diet - low sodium heart healthy  As directed    Discharge instructions  As directed    Comments:     Avoid Azithromycin, discuss alternatives to Crestor (can cause pancreatitis)   Follow with Primary MD Mathews Argyle, MD in 7 days   Get CBC, CMP, checked 7 days by Primary MD and again as instructed by your Primary MD.     Activity: As tolerated with Full fall precautions use walker/cane & assistance as needed   Disposition Home      Diet: Heart Healthy   For Heart failure patients - Check your Weight same time everyday, if you gain over 2 pounds, or you develop in leg swelling, experience more shortness of breath or chest pain, call your Primary MD immediately. Follow Cardiac Low Salt Diet and 1.8 lit/day fluid restriction.   On your next visit with her primary care physician please Get Medicines reviewed and adjusted.  Please request your Prim.MD to go over all Hospital Tests and Procedure/Radiological results at the follow up, please get all Hospital records sent to your Prim MD by signing hospital release before you go home.   If you experience worsening of your admission symptoms, develop shortness of breath, life threatening emergency, suicidal or homicidal thoughts you must seek medical attention immediately by calling 911 or calling your MD immediately  if symptoms less severe.  You Must read complete instructions/literature along with all the possible adverse reactions/side effects for all the Medicines you take and that have been prescribed to you. Take any new Medicines after you have completely understood and accpet all the possible adverse reactions/side effects.   Do not drive and provide baby sitting services if your were admitted for syncope or siezures until you have seen by Primary MD or a Neurologist and advised to do so again.  Do not drive when taking Pain medications.    Do not take more than prescribed Pain, Sleep and Anxiety Medications  Special Instructions: If you have smoked or chewed Tobacco  in the last 2 yrs please stop smoking, stop any regular Alcohol  and or any Recreational drug use.  Wear Seat belts while driving.   Please note  You were cared for by a hospitalist during your hospital stay. If you have any questions about your discharge medications or the care you received while you were in the hospital after you are discharged, you can call the unit and asked to speak with the  hospitalist on call if the hospitalist that took care of you is not available. Once you are discharged, your primary care physician will handle any further medical issues.  Please note that NO REFILLS for any discharge medications will be authorized once you are discharged, as it is imperative that you return to your primary care physician (or establish a relationship with a primary care physician if you do not have one) for your aftercare needs so that they can reassess your need for medications and monitor your lab values.   Increase activity slowly  As directed    Increase activity slowly  As directed        Medication List    STOP taking these medications       levofloxacin 500 MG tablet  Commonly known as:  LEVAQUIN      TAKE these medications       aspirin 81 MG tablet  Take 81 mg by mouth daily.     CALCIUM + D PO  Take 1 tablet by mouth daily.     desonide 0.05 % cream  Commonly known as:  DESOWEN  Apply 1 application topically 2 (two) times daily. Corner on left eye     fluticasone 50 MCG/ACT nasal spray  Commonly known as:  FLONASE  Place 1 spray into both nostrils daily.     levothyroxine 100 MCG tablet  Commonly known as:  SYNTHROID, LEVOTHROID  Take 100 mcg by mouth daily before breakfast.     MIRALAX PO  Take 17 g by mouth daily as needed (constipation).     multivitamin tablet  Take 1 tablet by mouth daily. Chewable     omeprazole 20 MG capsule  Commonly known as:  PRILOSEC  Take 20 mg by mouth daily.     PROAIR HFA IN  Inhale 1-2 puffs into the lungs as needed (Wheezing).     RESTASIS 0.05 % ophthalmic emulsion  Generic drug:  cycloSPORINE  Place 1 drop into both eyes 2 (two) times daily. Uses as directed     rosuvastatin 5 MG tablet  Commonly known as:  CRESTOR  Take 10 mg by mouth as directed.     STOOL SOFTENER PO  Take 1 capsule by mouth as needed (constipation).     SYSTANE FREE OP  Apply 1 drop to eye daily.     VITAMIN B1 PO  Take 1  capsule by mouth daily.          Diet and Activity recommendation: See Discharge Instructions above   Consults obtained -     Major procedures and Radiology Reports - PLEASE review detailed and final reports for all details, in brief -       US Abdomen Complete  07/23/2013   CLINICAL DATA:  Pancreatitis  EXAM: ULTRASOUND ABDOMEN COMPLETE  COMPARISON:  CT abdomen and pelvis 07/22/2013  FINDINGS: Gallbladder:  Well distended without calculi, wall thickening, pericholecystic fluid or sonographic Murphy sign.  Common bile duct:  Diameter: Normal caliber 5 mm diameter  Liver:  Normal appearance  IVC:  Normal appearance  Pancreas:  Normal appearance  Spleen:  Normal appearance, 7.4 cm length  Right Kidney:  Length: 12.2 cm.  Normal morphology without mass or hydronephrosis.  Left Kidney:  Length: 10.6 cm.  Normal morphology without mass or hydronephrosis.  Abdominal aorta:  Normal caliber  Other findings:  No free-fluid  IMPRESSION: Normal upper abdominal ultrasound.   Electronically Signed   By: Lavonia Dana M.D.   On: 07/23/2013 10:02   Ct Abdomen Pelvis W Contrast  07/22/2013   CLINICAL DATA:  Abdominal pain, nausea and vomiting  EXAM: CT ABDOMEN AND PELVIS WITH CONTRAST  TECHNIQUE: Multidetector CT imaging of the abdomen and pelvis was performed using the standard protocol following bolus administration of intravenous contrast.  CONTRAST:  87mL OMNIPAQUE IOHEXOL 300 MG/ML SOLN, 165mL OMNIPAQUE IOHEXOL 300 MG/ML SOLN  COMPARISON:  07/22/2013 abdominal radiographs  FINDINGS: Minor basilar atelectasis. Normal heart size. No pericardial or pleural effusion. No significant hiatal hernia.  Abdomen: Liver, gallbladder, biliary system, pancreas, spleen, adrenal glands, and kidneys are within normal limits for age and demonstrate no acute process. Incidental cortical atrophy of the left kidney upper and midpole regions.  No abdominal free fluid, fluid collection, hemorrhage, abscess, or adenopathy.  Aortic  atherosclerosis without aneurysm. No dissection. No retroperitoneal abnormality.  Negative for bowel obstruction, dilatation, ileus, or free air. Ingested contrast is retained within the stomach which can be seen with delayed gastric emptying or gastroparesis.  Normal appendix demonstrated.  Pelvis: Prior hysterectomy. No pelvic free fluid, fluid collection, hemorrhage, abscess, adenopathy, inguinal abnormality, or hernia. No acute distal bowel process. Several loops of small bowel all are mildly distended with fluid, nonspecific. Negative for bowel wall thickening or mesenteric abnormality.  Diffuse degenerative changes noted of the spine.  IMPRESSION: No acute intra-abdominal or pelvic finding by CT.  Ingested oral contrast remains in the stomach which can be seen with delayed gastric emptying or gastroparesis.   Electronically Signed   By: Daryll Brod M.D.   On: 07/22/2013 11:36   Dg Abd Acute W/chest  07/22/2013   CLINICAL DATA:  Lower abdominal pain.  Nausea and vomiting.  EXAM: ACUTE ABDOMEN SERIES (ABDOMEN 2 VIEW & CHEST 1 VIEW)  COMPARISON:  Chest radiograph on 09/02/2009  FINDINGS: There is no evidence of dilated bowel loops or free intraperitoneal air. No radiopaque calculi or other significant radiographic abnormality is seen. Heart size and mediastinal contours are within normal limits. Both lungs are clear.  IMPRESSION: Negative abdominal radiographs.  No acute cardiopulmonary disease.   Electronically Signed   By: Earle Gell M.D.   On: 07/22/2013 09:50    Micro Results      No results found for this or any previous visit (from the past 240 hour(s)).   History of present illness and  Hospital Course:     Kindly see H&P for history of present illness and admission details, please review complete Labs, Consult reports and Test reports for all details in brief Kimberly [name], is a 73 y.o. female, patient with history of GERD, IBS, dyslipidemia was admitted to the hospital with abdominal  pain, lipase was elevated and consistent with acute pancreatitis, her workup was unrevealing with an unremarkable right upper quadrant ultrasound, stable lipid panel, of note patient has been on Crestor and recently had 2 different rounds of azithromycin treatment for URI. Both these medications are known to cause pancreatitis and I wonder if one of these was the culprit.  She was treated her conservatively with bowel rest and IV fluids for 24 hours, subsequently her pain has completely resolved, her lipase has normalized, she has tolerated regular diet without any discomfort, will be discharged home with outpatient followup with PCP and GI physician. Will request GI physician to kindly consider not using azithromycin in the future and to consider alternatives to Crestor for treatment of dyslipidemia.   Her other chronic medical problems were stable and she will continue her home medications unchanged for them.     Today   Subjective:   Makensie Steenberg today has no headache,no chest abdominal pain,no new weakness tingling or numbness, feels much  better wants to go home today.    Objective:   Blood pressure 128/80, pulse 67, temperature 98.5 F (36.9 C), temperature source Oral, resp. rate 18, height 5\' 8"  (1.727 m), weight 79.379 kg (175 lb), SpO2 94.00%.   Intake/Output Summary (Last 24 hours) at 07/24/13 1811 Last data filed at 07/24/13 0900  Gross per 24 hour  Intake    440 ml  Output      0 ml  Net    440 ml    Exam Awake Alert, Oriented *3, No new F.N deficits, Normal affect [place].AT,PERRAL Supple Neck,No JVD, No cervical lymphadenopathy appriciated.  Symmetrical Chest wall movement, Good air movement bilaterally, CTAB RRR,No Gallops,Rubs or new Murmurs, No Parasternal Heave +ve B.Sounds, Abd Soft, Non tender, No organomegaly appriciated, No rebound -guarding or rigidity. No Cyanosis, Clubbing or edema, No new Rash or bruise  Data Review   CBC w Diff: Lab Results  Component  Value Date   WBC 5.7 07/23/2013   HGB 12.7 07/23/2013   HCT 38.4 07/23/2013   PLT 166 07/23/2013   LYMPHOPCT 4* 07/22/2013   MONOPCT 5 07/22/2013   EOSPCT 1 07/22/2013   BASOPCT 0 07/22/2013    CMP: Lab Results  Component Value Date   NA 141 07/24/2013   K 3.8 07/24/2013   CL 104 07/24/2013   CO2 25 07/24/2013   BUN 7 07/24/2013   CREATININE 0.63 07/24/2013   PROT 6.1 07/24/2013   ALBUMIN 3.2* 07/24/2013   BILITOT 0.3 07/24/2013   ALKPHOS 39 07/24/2013   AST 22 07/24/2013   ALT 16 07/24/2013  .   Total Time in preparing paper work, data evaluation and todays exam - 35 minutes  Thurnell Lose M.D on 07/24/2013 at [place]  [phone]

## 2013-07-24 NOTE — Care Management Note (Signed)
    Page 1 of 1   07/24/2013     12:19:20 PM   CARE MANAGEMENT NOTE 07/24/2013  Patient:  Kimberly [name], Kimberly [name]   Account Number:  [id number]  Date Initiated:  07/24/2013  Documentation initiated by:  Tomi Bamberger  Subjective/Objective Assessment:   dx pancreatitis  admit-lives with spouse.     Action/Plan:   Anticipated DC Date:  07/24/2013   Anticipated DC Plan:  [place]  CM consult      Choice offered to / List presented to:             Status of service:  Completed, signed off Medicare Important Message given?   (If response is "NO", the following Medicare IM given date fields will be blank) Date Medicare IM given:   Date Additional Medicare IM given:    Discharge Disposition:  HOME/SELF CARE  Per UR Regulation:  Reviewed for med. necessity/level of care/duration of stay  If discussed at Will of Stay Meetings, dates discussed:    Comments:

## 2013-07-25 ENCOUNTER — Telehealth: Payer: Self-pay | Admitting: Internal Medicine

## 2013-07-25 NOTE — Telephone Encounter (Signed)
Scheduled patient on 07/31/13 at 9:00 AM with Dr. Olevia Perches. Patient notified.

## 2013-07-27 ENCOUNTER — Encounter: Payer: Self-pay | Admitting: *Deleted

## 2013-07-31 ENCOUNTER — Encounter: Payer: Self-pay | Admitting: Internal Medicine

## 2013-07-31 ENCOUNTER — Ambulatory Visit (INDEPENDENT_AMBULATORY_CARE_PROVIDER_SITE_OTHER): Payer: Medicare Other | Admitting: Internal Medicine

## 2013-07-31 VITALS — BP 120/78 | HR 72 | Ht 67.0 in | Wt 179.0 lb

## 2013-07-31 DIAGNOSIS — K859 Acute pancreatitis without necrosis or infection, unspecified: Secondary | ICD-10-CM

## 2013-07-31 NOTE — Progress Notes (Signed)
Kimberly [name] Oct 06, 1940 [id number]  Note: This dictation was prepared with Dragon digital system. Any transcriptional errors that result from this procedure are unintentional.   History of Present Illness:  This is a 73 year old white female who was hospitalized for acute pancreatitis from 07/22/2013-07/24/2013. She was complaining of back pain and left upper quadrant abdominal pain and her lipase in the emergency room was 859. It rapidly returned back to normal within 24 hours. An upper abdominal ultrasound was normal. The common bile duct measure 5 mm and spleen measured 7.4 cm. Her CT scan of the abdomen was normal as well. There was no acute process. She has recovered very well and has no residual problems. The etiology of pancreatitis is not clear at this point. It is questionable if azithromycin contributed to the episode of pancreatitis. Patient was on a gait for 10 days prior to hospitalization. She was also on Crestor 5 mg a day. She has a history of a hiatal hernia and mild esophageal stricture which showed on her endoscopy in September 2013 and prior to that in 09/2008. She currently denies any dysphagia. Her last dilatation in September 2013 was carried out with 54 [place] Maloney dilator. She is up-to-date on her colonoscopy which was done in September 2013 by Dr. Sharlett Iles.    Past Medical History  Diagnosis Date  . DUB (dysfunctional uterine bleeding)   . Cataracts, bilateral   . Degenerative disc disease   . Bunion   . Ankle fracture     Stress fracture  . Lacunar stroke   . IBS (irritable bowel syndrome)   . Depression   . Anxiety   . GERD (gastroesophageal reflux disease)   . Diverticulosis of colon (without mention of hemorrhage) 2010    Colonoscopy  . Stricture and stenosis of esophagus 2005,2010    EGD   . Anal polyp 1998    Flex Sig   . Internal hemorrhoids without mention of complication 2725,3664    Colonoscopy   . External hemorrhoids 2000    Colonoscopy   . Family history of malignant neoplasm of gastrointestinal tract   . Hyperlipemia   . Fibromyalgia   . Hiatal hernia 2005,2010    EGD  . Hypothyroidism   . Allergy   . Anemia   . Asthma     border line has inhaler  . Stroke   . Irregular heart beat   . Pseudophakia, both eyes   . Angular blepharitis of left eye   . Retinal scar     left  . PCO (posterior capsular opacification)     left  . Dry eyes     bilateral  . Corneal scar     left eye  . PVD (posterior vitreous detachment) right  . Rotator cuff disorder     pain, left shoulder  . Migraine   . Anxiety   . IBS (irritable bowel syndrome)   . Pancreatitis     Past Surgical History  Procedure Laterality Date  . Total abdominal hysterectomy w/ bilateral salpingoophorectomy  1991    TAH BSO  . Tonsillectomy    . Breast lumpectomy Left     Benign  . Replacement total knee Right 2011  . Eye surgery Left   . Total knee arthroplasty      both  . Cataract extraction Bilateral 2013  . Nasal septum surgery    . Mouth surgery  11/13/11    Cyst removed from gum-benign   . Colonoscopy    .  Nasal septum surgery  1970's  . Esophageal dilation    . Abdominal hysterectomy      Allergies  Allergen Reactions  . Erythromycin Hives  . Atorvastatin Calcium [Atorvastatin] Other (See Comments)    Muscle weakness   . Augmentin [Amoxicillin-Pot Clavulanate] Nausea And Vomiting  . Clindamycin/Lincomycin     Stomach upset   . Ketoconazole Nausea And Vomiting  . Morphine Sulfate Other (See Comments)    Unknown   . Septra [Bactrim]     Upset stomach  . Simvastatin     Muscle pain    Family history and social history have been reviewed.  Review of Systems: Negative for dysphagia, abdominal pain. Mild constipation  The remainder of the 10 point ROS is negative except as outlined in the H&P  Physical Exam: General Appearance Well developed, in no distress Eyes  Non icteric  HEENT  Non traumatic, normocephalic  Mouth  No lesion, tongue papillated, no cheilosis Neck Supple without adenopathy, thyroid not enlarged, no carotid bruits, no JVD Lungs Clear to auscultation bilaterally COR Normal S1, normal S2, regular rhythm, no murmur, quiet precordium Abdomen soft nontender abdomen with normal active bowel sounds. No tympany or distention. No CVA tenderness. Liver edge at costal margin Rectal small external hemorrhoidal tags. Normal rectal sphincter tone. Stool is Hemoccult negative Extremities  No pedal edema Skin No lesions Neurological Alert and oriented x 3 Psychological Normal mood and affect  Assessment and Plan:   Problem #1 mild attack of acute pancreatitis 2 weeks ago. Patient has fully recovered now. Etiology could be attributed to Azithromycin. Her biliary studies were negative. She will hold her Crestor for 4 weeks and then may restart at 5 mg daily. She will continue a low-fat diet and may use MiraLax when necessary for constipation. I will see her in 3 months. If her abdominal pain recurs, we will proceed with an MRCP. She will also be using probiotics to improve her bowel habits.    Delfin Edis 07/31/2013

## 2013-07-31 NOTE — Patient Instructions (Addendum)
Please follow up with Dr Olevia Perches in 3 months. Dr Felipa Eth

## 2013-08-01 ENCOUNTER — Other Ambulatory Visit: Payer: Self-pay | Admitting: Gastroenterology

## 2013-09-14 ENCOUNTER — Encounter: Payer: Medicare Other | Admitting: Gynecology

## 2013-10-04 ENCOUNTER — Encounter: Payer: Medicare Other | Admitting: Gynecology

## 2013-10-26 ENCOUNTER — Encounter: Payer: Self-pay | Admitting: Internal Medicine

## 2013-10-26 ENCOUNTER — Ambulatory Visit (INDEPENDENT_AMBULATORY_CARE_PROVIDER_SITE_OTHER): Payer: Medicare Other | Admitting: Internal Medicine

## 2013-10-26 VITALS — BP 124/72 | HR 72 | Ht 66.54 in | Wt 183.0 lb

## 2013-10-26 DIAGNOSIS — K859 Acute pancreatitis without necrosis or infection, unspecified: Secondary | ICD-10-CM

## 2013-10-26 DIAGNOSIS — K219 Gastro-esophageal reflux disease without esophagitis: Secondary | ICD-10-CM

## 2013-10-26 NOTE — Progress Notes (Signed)
Kimberly [name] 1940/08/26 [id number]  Note: This dictation was prepared with Dragon digital system. Any transcriptional errors that result from this procedure are unintentional.   History of Present Illness:  This is a 73 year old white female with recent episode of acute pancreatitis requiring hospitalization from 07/22/2013-07/24/2013. The pancreatitis was thought to be related to Azithromycin or possibly prednisone . She has done very well since her hospitalization.. A CT scan of the abdomen was normal. Her abdominal ultrasound showed 5 mm common bile duct , spleen 7.4 cm , no biliary dilatation and no pancreatitis. She has been on a low-fat diet. She has resumed Crestor 5 mg daily. She wants to know if repeat imaging is necessary to make sure she doesn't develop chronic pancreatitis. She has been diagnosed with fibromyalgia and is followed by Dr Estanislado Pandy. Her allergist, Dr Neldon Mc found her to be allergic to dust, mold and mildew. She was offered allergy shots.    Past Medical History  Diagnosis Date  . DUB (dysfunctional uterine bleeding)   . Cataracts, bilateral   . Degenerative disc disease   . Bunion   . Ankle fracture     Stress fracture  . Lacunar stroke   . IBS (irritable bowel syndrome)   . Depression   . Anxiety   . GERD (gastroesophageal reflux disease)   . Diverticulosis of colon (without mention of hemorrhage) 2010    Colonoscopy  . Stricture and stenosis of esophagus 2005,2010    EGD   . Anal polyp 1998    Flex Sig   . Internal hemorrhoids without mention of complication 7829,5621    Colonoscopy   . External hemorrhoids 2000    Colonoscopy  . Family history of malignant neoplasm of gastrointestinal tract   . Hyperlipemia   . Fibromyalgia   . Hiatal hernia 2005,2010    EGD  . Hypothyroidism   . Allergy   . Anemia   . Asthma     border line has inhaler  . Stroke   . Irregular heart beat   . Pseudophakia, both eyes   . Angular blepharitis of left eye    . Retinal scar     left  . PCO (posterior capsular opacification)     left  . Dry eyes     bilateral  . Corneal scar     left eye  . PVD (posterior vitreous detachment) right  . Rotator cuff disorder     pain, left shoulder  . Migraine   . Anxiety   . IBS (irritable bowel syndrome)   . Pancreatitis     Past Surgical History  Procedure Laterality Date  . Total abdominal hysterectomy w/ bilateral salpingoophorectomy  1991    TAH BSO  . Tonsillectomy    . Breast lumpectomy Left     Benign  . Replacement total knee Right 2011  . Eye surgery Left   . Total knee arthroplasty      both  . Cataract extraction Bilateral 2013  . Nasal septum surgery    . Mouth surgery  11/13/11    Cyst removed from gum-benign   . Colonoscopy    . Nasal septum surgery  1970's  . Esophageal dilation    . Abdominal hysterectomy      Allergies  Allergen Reactions  . Erythromycin Hives  . Atorvastatin Calcium [Atorvastatin] Other (See Comments)    Muscle weakness   . Augmentin [Amoxicillin-Pot Clavulanate] Nausea And Vomiting  . Clindamycin/Lincomycin     Stomach upset   .  Ketoconazole Nausea And Vomiting  . Morphine Sulfate Other (See Comments)    Unknown   . Septra [Bactrim]     Upset stomach  . Simvastatin     Muscle pain    Family history and social history have been reviewed.  Review of Systems: Occasional heartburn for which she takes TUMS  The remainder of the 10 point ROS is negative except as outlined in the H&P  Physical Exam: General Appearance Well developed, in no distress Eyes  Non icteric  HEENT  Non traumatic, normocephalic  Mouth No lesion, tongue papillated, no cheilosis Neck Supple without adenopathy, thyroid not enlarged, no carotid bruits, no JVD Lungs Clear to auscultation bilaterally COR Normal S1, normal S2, regular rhythm, no murmur, quiet precordium Abdomen minimal tenderness left upper quadrant. The rest of the abdomen is unremarkable Rectal not  done Extremities  No pedal edema Skin No lesions Neurological Alert and oriented x 3 Psychological Normal mood and affect  Assessment and Plan:   Problem #49 73 year old white female with acute pancreatitis with rapid resolution related to medication (either prednisone or antibiotics). She has done well. There is no recurrence of pain to suggest chronic pancreatitis. She will remain on a low-fat diet. She also has irritable bowel syndrome and I advised her to take Benefiber daily to normalize her bowel habits. She also has MiraLax at home which she can take as needed. I don't have to see her on a regular basis. No reimaging necessary.    [place] Dragon 10/26/2013

## 2013-10-26 NOTE — Patient Instructions (Signed)
Dr Stoneking 

## 2013-10-29 DIAGNOSIS — M8580 Other specified disorders of bone density and structure, unspecified site: Secondary | ICD-10-CM

## 2013-10-29 HISTORY — DX: Other specified disorders of bone density and structure, unspecified site: M85.80

## 2013-11-06 ENCOUNTER — Ambulatory Visit: Payer: Medicare Other | Admitting: Internal Medicine

## 2013-11-06 ENCOUNTER — Encounter: Payer: Medicare Other | Admitting: Gynecology

## 2013-11-14 ENCOUNTER — Other Ambulatory Visit: Payer: Self-pay

## 2013-11-14 DIAGNOSIS — Z1231 Encounter for screening mammogram for malignant neoplasm of breast: Secondary | ICD-10-CM

## 2013-11-15 ENCOUNTER — Encounter: Payer: Self-pay | Admitting: Women's Health

## 2013-11-15 ENCOUNTER — Ambulatory Visit (INDEPENDENT_AMBULATORY_CARE_PROVIDER_SITE_OTHER): Payer: Medicare Other | Admitting: Women's Health

## 2013-11-15 DIAGNOSIS — N898 Other specified noninflammatory disorders of vagina: Secondary | ICD-10-CM

## 2013-11-15 LAB — WET PREP FOR TRICH, YEAST, CLUE
Clue Cells Wet Prep HPF POC: NONE SEEN
Trich, Wet Prep: NONE SEEN
WBC, Wet Prep HPF POC: NONE SEEN
Yeast Wet Prep HPF POC: NONE SEEN

## 2013-11-15 MED ORDER — TERCONAZOLE 0.4 % VA CREA: 1.0000 | TOPICAL_CREAM | Freq: Every day | VAGINAL | Status: DC

## 2013-11-15 NOTE — Progress Notes (Signed)
Patient ID: Kimberly [name], female   DOB: [birth year], 73 y.o.   MRN: [id number]  Presents with complaint of vaginal pruritus and discharge with odor. Denies urinary symptoms, spotting or fever/chills. Pancreatitis 08/2013 after a viral illness. TAH with BSO on no HRT  Exam: appears well External genitalia: erythema at introitus  Speculum: erythema along vaginal walls. Scant white discharge  Wet Prep: negative  Clinical Yeast vaginitis  Plan: Terazol 7 qhs x 3 days. Instructed to call if symptoms do not improve. Yeast prevention strategies discussed.

## 2013-11-23 ENCOUNTER — Encounter: Payer: Medicare Other | Admitting: Gynecology

## 2013-11-28 ENCOUNTER — Ambulatory Visit (INDEPENDENT_AMBULATORY_CARE_PROVIDER_SITE_OTHER): Payer: Medicare Other | Admitting: Podiatry

## 2013-11-28 ENCOUNTER — Encounter: Payer: Self-pay | Admitting: Podiatry

## 2013-11-28 VITALS — BP 128/74 | HR 61 | Resp 18

## 2013-11-28 DIAGNOSIS — M2040 Other hammer toe(s) (acquired), unspecified foot: Secondary | ICD-10-CM

## 2013-11-28 DIAGNOSIS — L84 Corns and callosities: Secondary | ICD-10-CM

## 2013-11-28 DIAGNOSIS — M2010 Hallux valgus (acquired), unspecified foot: Secondary | ICD-10-CM

## 2013-11-28 NOTE — Progress Notes (Signed)
° °  Subjective:    Patient ID: Kimberly [name], female    DOB: [birth year], 73 y.o.   MRN: [id number]  HPI I HAD SURGERY LAST YEAR ON MY LEFT FOOT AND THAT WAS AT THE [facility] AND DR Pryor Montes AND IT HAS MADE THESE CALLUSES WORSE ON THE BOTTOM OF BOTH MY FEET AND I DO HAVE INSERTS    Review of Systems  HENT: Positive for sinus pressure.   Eyes:       LEFT EYE HAD AN INFECTION  Musculoskeletal: Positive for back pain.  Allergic/Immunologic: Positive for food allergies.       SHRIMP   Hematological: Bruises/bleeds easily.       Objective:   Physical Exam        Assessment & Plan:

## 2013-11-29 NOTE — Progress Notes (Signed)
Subjective:     Patient ID: Kimberly [name], female   DOB: 21-Sep-1940, 73 y.o.   MRN: [id number]  HPI patient presents stating I need my calluses trim and I had the surgery I do and I like to do anything you guys can do to help me.   Review of Systems     Objective:   Physical Exam Neurovascular status is intact with no change in health history and has had some form of digital fusion second toe left and osteotomy of the left first metatarsal done at [facility]. 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

## 2013-12-20 ENCOUNTER — Encounter: Payer: Medicare Other | Admitting: Gynecology

## 2013-12-21 ENCOUNTER — Ambulatory Visit (INDEPENDENT_AMBULATORY_CARE_PROVIDER_SITE_OTHER): Payer: Medicare Other | Admitting: Gynecology

## 2013-12-21 ENCOUNTER — Encounter: Payer: Self-pay | Admitting: Gynecology

## 2013-12-21 VITALS — BP 118/72 | Ht 67.0 in | Wt 180.0 lb

## 2013-12-21 DIAGNOSIS — M8580 Other specified disorders of bone density and structure, unspecified site: Secondary | ICD-10-CM

## 2013-12-21 DIAGNOSIS — M899 Disorder of bone, unspecified: Secondary | ICD-10-CM

## 2013-12-21 DIAGNOSIS — M949 Disorder of cartilage, unspecified: Secondary | ICD-10-CM

## 2013-12-21 DIAGNOSIS — N952 Postmenopausal atrophic vaginitis: Secondary | ICD-10-CM

## 2013-12-21 NOTE — Progress Notes (Signed)
Kimberly [name] July 30, 1940 [id number]        72 y.o.  G0P0 for followup exam. Several issues noted below.  Past medical history,surgical history, problem list, medications, allergies, family history and social history were all reviewed and documented as reviewed in the EPIC chart.  ROS:  12 system ROS performed with pertinent positives and negatives included in the history, assessment and plan.   Additional significant findings :  None   Exam: Kimberly [name] assistant Filed Vitals:   12/21/13 0954  BP: 118/72  Height: 5\' 7"  (1.702 m)  Weight: 180 lb (81.647 kg)   General appearance:  Normal affect, orientation and appearance. Skin: Grossly normal HEENT: Without gross lesions.  No cervical or supraclavicular adenopathy. Thyroid normal.  Lungs:  Clear without wheezing, rales or rhonchi Cardiac: RR, without RMG Abdominal:  Soft, nontender, without masses, guarding, rebound, organomegaly or hernia Breasts:  Examined lying and sitting without masses, retractions, discharge or axillary adenopathy. Pelvic:  Ext/BUS/vagina with significant atrophic changes  Adnexa  Without masses or tenderness    Anus and perineum  Normal   Rectovaginal  Normal sphincter tone without palpated masses or tenderness.    Assessment/Plan:  73 y.o. G0P0 female for followup exam.  1. Postmenopausal/significant atrophic changes. We have discussed in the past her vaginal atrophy and options for treatment. I again reviewed options to include observation, vaginal estrogen, Osphena, OTC moisturizers. Patient is not interested in prescription medication. She will try OTC moisturizers. She is not having significant symptoms from vaginal dryness. She is not sexually active. She will followup if she wants to pursue prescription medication. 2. Osteopenia. Patient reports having bone density repeated this year at Dr. Carlyle Lipa office. I do not have a report of this and have asked her to forward this to me. She agrees to do so.  Increase calcium and vitamin D recommendations reviewed. She was told that the bone density was stable. 3. Pap smear 2011. No Pap smear done today. No history of significant abnormal Pap smears. Status post hysterectomy for benign indications and over the age of 29. Per current screening guidelines we both agreed to stop screening and she is comfortable with this. 4. Mammography 03/2013. Reminded her to schedule this coming fall. SBE monthly reviewed. 5. Colonoscopy 2013. Repeat at their recommended interval. 6. Health maintenance. No blood work ordered as this is done through her primary physician's office. Followup one year, sooner as needed.   Note: This document was prepared with digital dictation and possible smart phrase technology. Any transcriptional errors that result from this process are unintentional.   Anastasio Auerbach MD, 10:35 AM 12/21/2013

## 2013-12-21 NOTE — Patient Instructions (Signed)
Followup in one year, sooner as needed.  You may obtain a copy of any labs that were done today by logging onto MyChart as outlined in the instructions provided with your AVS (after visit summary). The office will not call with normal lab results but certainly if there are any significant abnormalities then we will contact you.   Health Maintenance, Female A healthy lifestyle and preventative care can promote health and wellness.  Maintain regular health, dental, and eye exams.  Eat a healthy diet. Foods like vegetables, fruits, whole grains, low-fat dairy products, and lean protein foods contain the nutrients you need without too many calories. Decrease your intake of foods high in solid fats, added sugars, and salt. Get information about a proper diet from your caregiver, if necessary.  Regular physical exercise is one of the most important things you can do for your health. Most adults should get at least 150 minutes of moderate-intensity exercise (any activity that increases your heart rate and causes you to sweat) each week. In addition, most adults need muscle-strengthening exercises on 2 or more days a week.   Maintain a healthy weight. The body mass index (BMI) is a screening tool to identify possible weight problems. It provides an estimate of body fat based on height and weight. Your caregiver can help determine your BMI, and can help you achieve or maintain a healthy weight. For adults 20 years and older:  A BMI below 18.5 is considered underweight.  A BMI of 18.5 to 24.9 is normal.  A BMI of 25 to 29.9 is considered overweight.  A BMI of 30 and above is considered obese.  Maintain normal blood lipids and cholesterol by exercising and minimizing your intake of saturated fat. Eat a balanced diet with plenty of fruits and vegetables. Blood tests for lipids and cholesterol should begin at age 41 and be repeated every 5 years. If your lipid or cholesterol levels are high, you are over  50, or you are a high risk for heart disease, you may need your cholesterol levels checked more frequently.Ongoing high lipid and cholesterol levels should be treated with medicines if diet and exercise are not effective.  If you smoke, find out from your caregiver how to quit. If you do not use tobacco, do not start.  Lung cancer screening is recommended for adults aged 49 80 years who are at high risk for developing lung cancer because of a history of smoking. Yearly low-dose computed tomography (CT) is recommended for people who have at least a 30-pack-year history of smoking and are a current smoker or have quit within the past 15 years. A pack year of smoking is smoking an average of 1 pack of cigarettes a day for 1 year (for example: 1 pack a day for 30 years or 2 packs a day for 15 years). Yearly screening should continue until the smoker has stopped smoking for at least 15 years. Yearly screening should also be stopped for people who develop a health problem that would prevent them from having lung cancer treatment.  If you are pregnant, do not drink alcohol. If you are breastfeeding, be very cautious about drinking alcohol. If you are not pregnant and choose to drink alcohol, do not exceed 1 drink per day. One drink is considered to be 12 ounces (355 mL) of beer, 5 ounces (148 mL) of wine, or 1.5 ounces (44 mL) of liquor.  Avoid use of street drugs. Do not share needles with anyone. Ask for help  if you need support or instructions about stopping the use of drugs.  High blood pressure causes heart disease and increases the risk of stroke. Blood pressure should be checked at least every 1 to 2 years. Ongoing high blood pressure should be treated with medicines, if weight loss and exercise are not effective.  If you are 55 to 73 years old, ask your caregiver if you should take aspirin to prevent strokes.  Diabetes screening involves taking a blood sample to check your fasting blood sugar level.  This should be done once every 3 years, after age 45, if you are within normal weight and without risk factors for diabetes. Testing should be considered at a younger age or be carried out more frequently if you are overweight and have at least 1 risk factor for diabetes.  Breast cancer screening is essential preventative care for women. You should practice "breast self-awareness." This means understanding the normal appearance and feel of your breasts and may include breast self-examination. Any changes detected, no matter how small, should be reported to a caregiver. Women in their 20s and 30s should have a clinical breast exam (CBE) by a caregiver as part of a regular health exam every 1 to 3 years. After age 40, women should have a CBE every year. Starting at age 40, women should consider having a mammogram (breast X-ray) every year. Women who have a family history of breast cancer should talk to their caregiver about genetic screening. Women at a high risk of breast cancer should talk to their caregiver about having an MRI and a mammogram every year.  Breast cancer gene (BRCA)-related cancer risk assessment is recommended for women who have family members with BRCA-related cancers. BRCA-related cancers include breast, ovarian, tubal, and peritoneal cancers. Having family members with these cancers may be associated with an increased risk for harmful changes (mutations) in the breast cancer genes BRCA1 and BRCA2. Results of the assessment will determine the need for genetic counseling and BRCA1 and BRCA2 testing.  The Pap test is a screening test for cervical cancer. Women should have a Pap test starting at age 21. Between ages 21 and 29, Pap tests should be repeated every 2 years. Beginning at age 30, you should have a Pap test every 3 years as long as the past 3 Pap tests have been normal. If you had a hysterectomy for a problem that was not cancer or a condition that could lead to cancer, then you no  longer need Pap tests. If you are between ages 65 and 70, and you have had normal Pap tests going back 10 years, you no longer need Pap tests. If you have had past treatment for cervical cancer or a condition that could lead to cancer, you need Pap tests and screening for cancer for at least 20 years after your treatment. If Pap tests have been discontinued, risk factors (such as a new sexual partner) need to be reassessed to determine if screening should be resumed. Some women have medical problems that increase the chance of getting cervical cancer. In these cases, your caregiver may recommend more frequent screening and Pap tests.  The human papillomavirus (HPV) test is an additional test that may be used for cervical cancer screening. The HPV test looks for the virus that can cause the cell changes on the cervix. The cells collected during the Pap test can be tested for HPV. The HPV test could be used to screen women aged 30 years and older, and   should be used in women of any age who have unclear Pap test results. After the age of 30, women should have HPV testing at the same frequency as a Pap test.  Colorectal cancer can be detected and often prevented. Most routine colorectal cancer screening begins at the age of 50 and continues through age 75. However, your caregiver may recommend screening at an earlier age if you have risk factors for colon cancer. On a yearly basis, your caregiver may provide home test kits to check for hidden blood in the stool. Use of a small camera at the end of a tube, to directly examine the colon (sigmoidoscopy or colonoscopy), can detect the earliest forms of colorectal cancer. Talk to your caregiver about this at age 50, when routine screening begins. Direct examination of the colon should be repeated every 5 to 10 years through age 75, unless early forms of pre-cancerous polyps or small growths are found.  Hepatitis C blood testing is recommended for all people born from  1945 through 1965 and any individual with known risks for hepatitis C.  Practice safe sex. Use condoms and avoid high-risk sexual practices to reduce the spread of sexually transmitted infections (STIs). Sexually active women aged 25 and younger should be checked for Chlamydia, which is a common sexually transmitted infection. Older women with new or multiple partners should also be tested for Chlamydia. Testing for other STIs is recommended if you are sexually active and at increased risk.  Osteoporosis is a disease in which the bones lose minerals and strength with aging. This can result in serious bone fractures. The risk of osteoporosis can be identified using a bone density scan. Women ages 65 and over and women at risk for fractures or osteoporosis should discuss screening with their caregivers. Ask your caregiver whether you should be taking a calcium supplement or vitamin D to reduce the rate of osteoporosis.  Menopause can be associated with physical symptoms and risks. Hormone replacement therapy is available to decrease symptoms and risks. You should talk to your caregiver about whether hormone replacement therapy is right for you.  Use sunscreen. Apply sunscreen liberally and repeatedly throughout the day. You should seek shade when your shadow is shorter than you. Protect yourself by wearing long sleeves, pants, a wide-brimmed hat, and sunglasses year round, whenever you are outdoors.  Notify your caregiver of new moles or changes in moles, especially if there is a change in shape or color. Also notify your caregiver if a mole is larger than the size of a pencil eraser.  Stay current with your immunizations. Document Released: 11/30/2010 Document Revised: 09/11/2012 Document Reviewed: 11/30/2010 [employer] Patient Information 2014 [employer].   

## 2014-02-17 ENCOUNTER — Encounter (HOSPITAL_BASED_OUTPATIENT_CLINIC_OR_DEPARTMENT_OTHER): Payer: Self-pay | Admitting: Emergency Medicine

## 2014-02-17 ENCOUNTER — Emergency Department (HOSPITAL_BASED_OUTPATIENT_CLINIC_OR_DEPARTMENT_OTHER)
Admission: EM | Admit: 2014-02-17 | Discharge: 2014-02-17 | Disposition: A | Discharge status: Home / Self Care | Payer: Medicare Other | Attending: Emergency Medicine | Admitting: Emergency Medicine

## 2014-02-17 DIAGNOSIS — E039 Hypothyroidism, unspecified: Secondary | ICD-10-CM | POA: Diagnosis not present

## 2014-02-17 DIAGNOSIS — J45909 Unspecified asthma, uncomplicated: Secondary | ICD-10-CM | POA: Insufficient documentation

## 2014-02-17 DIAGNOSIS — W268XXA Contact with other sharp object(s), not elsewhere classified, initial encounter: Secondary | ICD-10-CM | POA: Diagnosis not present

## 2014-02-17 DIAGNOSIS — Y9289 Other specified places as the place of occurrence of the external cause: Secondary | ICD-10-CM | POA: Insufficient documentation

## 2014-02-17 DIAGNOSIS — E785 Hyperlipidemia, unspecified: Secondary | ICD-10-CM | POA: Diagnosis not present

## 2014-02-17 DIAGNOSIS — Z8781 Personal history of (healed) traumatic fracture: Secondary | ICD-10-CM | POA: Insufficient documentation

## 2014-02-17 DIAGNOSIS — Z8739 Personal history of other diseases of the musculoskeletal system and connective tissue: Secondary | ICD-10-CM | POA: Insufficient documentation

## 2014-02-17 DIAGNOSIS — Z7982 Long term (current) use of aspirin: Secondary | ICD-10-CM | POA: Insufficient documentation

## 2014-02-17 DIAGNOSIS — Y9389 Activity, other specified: Secondary | ICD-10-CM | POA: Insufficient documentation

## 2014-02-17 DIAGNOSIS — K219 Gastro-esophageal reflux disease without esophagitis: Secondary | ICD-10-CM | POA: Diagnosis not present

## 2014-02-17 DIAGNOSIS — Z8659 Personal history of other mental and behavioral disorders: Secondary | ICD-10-CM | POA: Diagnosis not present

## 2014-02-17 DIAGNOSIS — Z79899 Other long term (current) drug therapy: Secondary | ICD-10-CM | POA: Insufficient documentation

## 2014-02-17 DIAGNOSIS — Z8669 Personal history of other diseases of the nervous system and sense organs: Secondary | ICD-10-CM | POA: Diagnosis not present

## 2014-02-17 DIAGNOSIS — Z800 Family history of malignant neoplasm of digestive organs: Secondary | ICD-10-CM | POA: Diagnosis not present

## 2014-02-17 DIAGNOSIS — Z862 Personal history of diseases of the blood and blood-forming organs and certain disorders involving the immune mechanism: Secondary | ICD-10-CM | POA: Diagnosis not present

## 2014-02-17 DIAGNOSIS — Z87891 Personal history of nicotine dependence: Secondary | ICD-10-CM | POA: Insufficient documentation

## 2014-02-17 DIAGNOSIS — Z8673 Personal history of transient ischemic attack (TIA), and cerebral infarction without residual deficits: Secondary | ICD-10-CM | POA: Insufficient documentation

## 2014-02-17 DIAGNOSIS — S61209A Unspecified open wound of unspecified finger without damage to nail, initial encounter: Secondary | ICD-10-CM | POA: Insufficient documentation

## 2014-02-17 DIAGNOSIS — IMO0002 Reserved for concepts with insufficient information to code with codable children: Secondary | ICD-10-CM | POA: Diagnosis not present

## 2014-02-17 DIAGNOSIS — S61219A Laceration without foreign body of unspecified finger without damage to nail, initial encounter: Secondary | ICD-10-CM

## 2014-02-17 NOTE — Discharge Instructions (Signed)
CALL DR. Felipa Eth AND ASK ABOUT LAST TETANUS. iF NEEDED, GET ONE IN THE NEXT WEEK.   Sutured Wound Care Sutures are stitches that can be used to close wounds. Wound care helps prevent pain and infection.  HOME CARE INSTRUCTIONS   Rest and elevate the injured area until all the pain and swelling are gone.  Only take over-the-counter or prescription medicines for pain, discomfort, or fever as directed by your caregiver.  After 48 hours, gently wash the area with mild soap and water once a day, or as directed. Rinse off the soap. Pat the area dry with a clean towel. Do not rub the wound. This may cause bleeding.  Follow your caregiver's instructions for how often to change the bandage (dressing). Stop using a dressing after 2 days or after the wound stops draining.  If the dressing sticks, moisten it with soapy water and gently remove it.  Apply ointment on the wound as directed.  Avoid stretching a sutured wound.  Drink enough fluids to keep your urine clear or pale yellow.  Follow up with your caregiver for suture removal as directed.  Use sunscreen on your wound for the next 3 to 6 months so the scar will not darken. SEEK IMMEDIATE MEDICAL CARE IF:   Your wound becomes red, swollen, hot, or tender.  You have increasing pain in the wound.  You have a red streak that extends from the wound.  There is pus coming from the wound.  You have a fever.  You have shaking chills.  There is a bad smell coming from the wound.  You have persistent bleeding from the wound. MAKE SURE YOU:   Understand these instructions.  Will watch your condition.  Will get help right away if you are not doing well or get worse. Document Released: 06/24/2004 Document Revised: 08/09/2011 Document Reviewed: 09/20/2010 [facility] Patient Information 2015 [place], [place]. This information is not intended to replace advice given to you by your health care provider. Make sure you discuss any questions you  have with your health care provider.

## 2014-02-17 NOTE — ED Notes (Signed)
Wound sutured by [name], PA-C. Wound loosely wrapped with gauze and koban. Connye Burkitt, RN

## 2014-02-17 NOTE — ED Notes (Signed)
Pt reports cutting finger on glass.  (L) pinky finger.  States that she went to [facility] and they could not get bleeding stopped so they sent her here.  Pt has pressure dressing applied to finger, no bleeding noted through dressing.

## 2014-02-17 NOTE — ED Provider Notes (Signed)
CSN: [id number]     Arrival date & time 02/17/14  1807 History  This chart was scribed for Kimberly Lange, PA with Malvin Johns, MD by Edison Simon, ED Scribe. This patient was seen in room MH05/MH05 and the patient's care was started at 7:33 PM.    Chief Complaint  Patient presents with  . Extremity Laceration   The history is provided by the patient. No language interpreter was used.   HPI Comments: Kimberly [name] is a 73 y.o. female who presents to the Emergency Department complaining of 5th finger laceration to her left hand with onset earlier today after cutting it on glass at work. She states she was not able to stop the bleeding at [facility], but the wound was dressed and she was referred and was referred here. She denies Coumadin but reports ASA use. 2% wihtout lidocaine  Past Medical History  Diagnosis Date  . Cataracts, bilateral   . Degenerative disc disease   . Bunion   . Ankle fracture     Stress fracture  . Lacunar stroke   . IBS (irritable bowel syndrome)   . Depression   . Anxiety   . GERD (gastroesophageal reflux disease)   . Diverticulosis of colon (without mention of hemorrhage) 2010    Colonoscopy  . Stricture and stenosis of esophagus 2005,2010    EGD   . Anal polyp 1998    Flex Sig   . Internal hemorrhoids without mention of complication 2035,5974    Colonoscopy   . External hemorrhoids 2000    Colonoscopy  . Family history of malignant neoplasm of gastrointestinal tract   . Hyperlipemia   . Fibromyalgia   . Hiatal hernia 2005,2010    EGD  . Hypothyroidism   . Allergy   . Anemia   . Asthma     border line has inhaler  . Stroke   . Irregular heart beat   . Pseudophakia, both eyes   . Angular blepharitis of left eye   . Retinal scar     left  . PCO (posterior capsular opacification)     left  . Dry eyes     bilateral  . Corneal scar     left eye  . PVD (posterior vitreous detachment) right  . Rotator cuff disorder     pain, left shoulder  .  Migraine   . Anxiety   . IBS (irritable bowel syndrome)   . Pancreatitis   . Osteopenia 10/2013    T score -1.6 FRAX 10%/1.6%   Past Surgical History  Procedure Laterality Date  . Total abdominal hysterectomy w/ bilateral salpingoophorectomy  1991    TAH BSO  . Tonsillectomy    . Breast lumpectomy Left     Benign  . Replacement total knee Right 2011  . Eye surgery Left   . Total knee arthroplasty      both  . Cataract extraction Bilateral 2013  . Nasal septum surgery    . Mouth surgery  11/13/11    Cyst removed from gum-benign   . Colonoscopy    . Nasal septum surgery  1970's  . Esophageal dilation     Family History  Problem Relation Age of Onset  . Dementia Mother   . Irritable bowel syndrome Mother   . Hypertension Father   . Congestive Heart Failure Father   . Diabetes Sister   . Colon cancer Paternal Aunt   . Esophageal cancer Neg Hx   . Rectal cancer Neg Hx   .  Stomach cancer Neg Hx   . Heart disease Maternal Grandmother   . Alcohol abuse Father   . Alzheimer's disease Mother   . Heart attack Paternal Grandfather   . Heart attack Maternal Grandmother   . Heart attack Maternal Grandfather   . Arthritis Brother   . Other Sister     pituitary disease  . Allergies Sister   . Other Sister     joint problems  . Stroke Mother    History  Substance Use Topics  . Smoking status: Former Research scientist (life sciences)  . Smokeless tobacco: Never Used     Comment: Stopped smoking age 23  . Alcohol Use: No   OB History   Grav Para Term Preterm Abortions TAB SAB Ect Mult Living   0              Review of Systems  Constitutional: Negative for fever.  Gastrointestinal: Negative for nausea and vomiting.  Musculoskeletal: Negative for back pain and neck pain.  Skin: Positive for wound (laceration to fifth finger on left hand).  Neurological: Negative for weakness, light-headedness, numbness and headaches.      Allergies  Azithromycin; Erythromycin; Atorvastatin calcium;  Augmentin; Clindamycin/lincomycin; Ketoconazole; Morphine sulfate; Septra; and Simvastatin  Home Medications   Prior to Admission medications   Medication Sig Start Date End Date Taking? Authorizing Provider  Albuterol Sulfate (PROAIR HFA IN) Inhale 1-2 puffs into the lungs as needed (Wheezing).     Historical Provider, MD  aspirin 81 MG tablet Take 81 mg by mouth daily.    Historical Provider, MD  Calcium Carbonate-Vitamin D (CALCIUM + D PO) Take 1 tablet by mouth daily.     Historical Provider, MD  Docusate Calcium (STOOL SOFTENER PO) Take 1 capsule by mouth as needed (constipation).     Historical Provider, MD  fluticasone (FLONASE) 50 MCG/ACT nasal spray Place 1 spray into both nostrils 3 (three) times a week.  06/06/13   Historical Provider, MD  levothyroxine (SYNTHROID, LEVOTHROID) 125 MCG tablet Take 111 mcg by mouth daily before breakfast.     Historical Provider, MD  montelukast (SINGULAIR) 10 MG tablet Take 10 mg by mouth daily.  10/01/13   Historical Provider, MD  Multiple Vitamin (MULTIVITAMIN) tablet Take 1 tablet by mouth daily. Chewable    Historical Provider, MD  Olopatadine HCl 0.6 % SOLN Place 1 drop into the nose daily.  09/04/13   Historical Provider, MD  omeprazole (PRILOSEC) 20 MG capsule TAKE ONE CAPSULE BY MOUTH EVERY DAY 08/01/13   Lafayette Dragon, MD  Polyethylene Glycol 3350 (MIRALAX PO) Take 17 g by mouth daily as needed (constipation).     Historical Provider, MD  RESTASIS 0.05 % ophthalmic emulsion Place 1 drop into both eyes daily. Uses as directed 09/30/12   Historical Provider, MD  rosuvastatin (CRESTOR) 5 MG tablet Take 10 mg by mouth as directed.     Historical Provider, MD  SIMPLY SALINE NA Place into the nose as needed.    Historical Provider, MD  Thiamine Mononitrate (VITAMIN B1 PO) Take 1 capsule by mouth daily.    Historical Provider, MD   BP 150/79  Pulse 69  Temp(Src) 97.7 F (36.5 C) (Oral)  Resp 18  Ht 5\' 7"  (1.702 m)  Wt 180 lb (81.647 kg)  BMI 28.19  kg/m2  SpO2 98% Physical Exam  Nursing note and vitals reviewed. Constitutional: She is oriented to person, place, and time. She appears well-developed and well-nourished.  HENT:  Head: Normocephalic and atraumatic.  Neck: Normal range of motion. Neck supple.  Cardiovascular: Normal rate.   Pulmonary/Chest: Effort normal.  Musculoskeletal: She exhibits edema and tenderness.  Neurological: She is alert and oriented to person, place, and time.  Skin: Skin is warm and dry.  1cm lac distal left 5th finger. Nail is intact. No FB visualized.  Psychiatric: She has a normal mood and affect.    ED Course  Procedures (including critical care time) .LACERATION REPAIR Performed by: Kimberly [name] Consent: Verbal consent obtained. Risks and benefits: risks, benefits and alternatives were discussed Patient identity confirmed: provided demographic data Time out performed prior to procedure Prepped and Draped in normal sterile fashion Wound explored Laceration Location: distal left 5th finger Laceration Length: 1 cm No Foreign Bodies seen or palpated Anesthesia: local infiltration Local anesthetic: lidocaine 2% without epinephrine Anesthetic total: <1 ml Irrigation method: syringe Amount of cleaning: standard Skin closure: yes Number of sutures or staples: 3 5-0 sutures Technique: simple interrupted Patient tolerance: Patient tolerated the procedure well with no immediate complications.  Labs Review Labs Reviewed - No data to display  Imaging Review No results found.   EKG Interpretation None     DIAGNOSTIC STUDIES: Oxygen Saturation is 98% on room air, normal by my interpretation.    COORDINATION OF CARE: 7:44 PM Discussed treatment plan with patient at beside, the patient agrees with the plan and has no further questions at this time.    MDM   Final diagnoses:  None   1. Left fifth finger laceration  Uncomplicated finger laceration, repaired. The patient chose to  wait on a tetanus shot to check with her doctor on last administration. She will go this week to see him and get one if due.   I personally performed the services described in this documentation, which was scribed in my presence. The recorded information has been reviewed and is accurate.     Dewaine Oats, PA-C 02/17/14 2002

## 2014-02-18 NOTE — ED Provider Notes (Signed)
Medical screening examination/treatment/procedure(s) were performed by non-physician practitioner and as supervising physician I was immediately available for consultation/collaboration.   EKG Interpretation None        Malvin Johns, MD 02/18/14 [phone]

## 2014-02-25 ENCOUNTER — Other Ambulatory Visit: Payer: Self-pay | Admitting: Nurse Practitioner

## 2014-02-25 ENCOUNTER — Ambulatory Visit
Admission: RE | Admit: 2014-02-25 | Discharge: 2014-02-25 | Disposition: A | Discharge status: Home / Self Care | Payer: Medicare Other | Source: Ambulatory Visit | Attending: Nurse Practitioner | Admitting: Nurse Practitioner

## 2014-02-25 DIAGNOSIS — J019 Acute sinusitis, unspecified: Secondary | ICD-10-CM

## 2014-03-06 ENCOUNTER — Ambulatory Visit (INDEPENDENT_AMBULATORY_CARE_PROVIDER_SITE_OTHER): Payer: Medicare Other | Admitting: Internal Medicine

## 2014-03-06 ENCOUNTER — Other Ambulatory Visit (INDEPENDENT_AMBULATORY_CARE_PROVIDER_SITE_OTHER): Payer: Medicare Other

## 2014-03-06 ENCOUNTER — Telehealth: Payer: Self-pay | Admitting: *Deleted

## 2014-03-06 ENCOUNTER — Telehealth: Payer: Self-pay | Admitting: Internal Medicine

## 2014-03-06 ENCOUNTER — Encounter: Payer: Self-pay | Admitting: Internal Medicine

## 2014-03-06 VITALS — BP 110/80 | HR 74 | Temp 97.7°F | Ht 67.0 in | Wt 182.2 lb

## 2014-03-06 DIAGNOSIS — R109 Unspecified abdominal pain: Secondary | ICD-10-CM

## 2014-03-06 DIAGNOSIS — R110 Nausea: Secondary | ICD-10-CM

## 2014-03-06 DIAGNOSIS — K589 Irritable bowel syndrome without diarrhea: Secondary | ICD-10-CM

## 2014-03-06 DIAGNOSIS — Z8719 Personal history of other diseases of the digestive system: Secondary | ICD-10-CM

## 2014-03-06 LAB — CBC WITH DIFFERENTIAL/PLATELET
Basophils Absolute: 0 10*3/uL (ref 0.0–0.1)
Basophils Relative: 0.7 % (ref 0.0–3.0)
Eosinophils Absolute: 0.1 10*3/uL (ref 0.0–0.7)
Eosinophils Relative: 1.9 % (ref 0.0–5.0)
HCT: 38.9 % (ref 36.0–46.0)
Hemoglobin: 13.2 g/dL (ref 12.0–15.0)
Lymphocytes Relative: 35.9 % (ref 12.0–46.0)
Lymphs Abs: 2.3 10*3/uL (ref 0.7–4.0)
MCHC: 34 g/dL (ref 30.0–36.0)
MCV: 90.7 fl (ref 78.0–100.0)
Monocytes Absolute: 0.5 10*3/uL (ref 0.1–1.0)
Monocytes Relative: 7.4 % (ref 3.0–12.0)
Neutro Abs: 3.5 10*3/uL (ref 1.4–7.7)
Neutrophils Relative %: 54.1 % (ref 43.0–77.0)
Platelets: 226 10*3/uL (ref 150.0–400.0)
RBC: 4.29 Mil/uL (ref 3.87–5.11)
RDW: 13 % (ref 11.5–15.5)
WBC: 6.4 10*3/uL (ref 4.0–10.5)

## 2014-03-06 LAB — COMPREHENSIVE METABOLIC PANEL
ALT: 17 U/L (ref 0–35)
AST: 23 U/L (ref 0–37)
Albumin: 3.7 g/dL (ref 3.5–5.2)
Alkaline Phosphatase: 46 U/L (ref 39–117)
BUN: 16 mg/dL (ref 6–23)
CO2: 26 mEq/L (ref 19–32)
Calcium: 9.5 mg/dL (ref 8.4–10.5)
Chloride: 105 mEq/L (ref 96–112)
Creatinine, Ser: 0.7 mg/dL (ref 0.4–1.2)
GFR: 91.76 mL/min (ref >60.00)
Glucose, Bld: 87 mg/dL (ref 70–99)
Potassium: 4.3 mEq/L (ref 3.5–5.1)
Sodium: 138 mEq/L (ref 135–145)
Total Bilirubin: 0.6 mg/dL (ref 0.2–1.2)
Total Protein: 7.3 g/dL (ref 6.0–8.3)

## 2014-03-06 LAB — LIPASE: Lipase: 30 U/L (ref 11.0–59.0)

## 2014-03-06 LAB — AMYLASE: Amylase: 63 U/L (ref 27–131)

## 2014-03-06 MED ORDER — ALPRAZOLAM 0.25 MG PO TABS: ORAL_TABLET | ORAL | Status: DC

## 2014-03-06 MED ORDER — OMEPRAZOLE 20 MG PO CPDR: 20.0000 mg | DELAYED_RELEASE_CAPSULE | Freq: Every day | ORAL | Status: DC

## 2014-03-06 NOTE — Telephone Encounter (Signed)
Message copied by Hulan Saas on Wed Mar 06, 2014 10:29 AM ------      Message from: Lafayette Dragon      Created: Wed Mar 06, 2014 10:16 AM      Regarding: labs       Rollene Fare, could you, please, have pt come earlier for lab work if she lives in town: [place].Thanx ------

## 2014-03-06 NOTE — Patient Instructions (Signed)
We have sent the following medications to your pharmacy for you to pick up at your convenience: Prilosec Xanax  You have been scheduled for an abdominal ultrasound at [facility] (1st floor of hospital) on Monday 03/11/14 at 8:30 am. Please arrive 15 minutes prior to your appointment for registration. Make certain not to have anything to eat or drink 6 hours prior to your appointment. Should you need to reschedule your appointment, please contact radiology at [phone]. This test typically takes about 30 minutes to perform.  CC:Dr Stoneking

## 2014-03-06 NOTE — Telephone Encounter (Signed)
Patient states yesterday, she started having symptoms similar to symptoms she had with acute pancreatitis. States she had a clear watery discharge rectally. Reports this is more solid now. She is having lower abdominal pain and nausea. Scheduled with Dr. Olevia Perches today at 2:15 PM.

## 2014-03-06 NOTE — Telephone Encounter (Signed)
Spoke with patient and she can come for labs for Dr. Olevia Perches prior to [place].

## 2014-03-06 NOTE — Progress Notes (Signed)
Kimberly [name] [birth year] [id number]  Note: This dictation was prepared with Dragon digital system. Any transcriptional errors that result from this procedure are unintentional.   History of Present Illness:  This is a 73 year old white female with acute episodes of lower abdominal pain, bloating and abdominal discomfort which started yesterday and resemble pancreatitis. It has been associated with nausea. She was hospitalized with acute pancreatitis from 07/22/13-07/24/2013. Lipase was elevated to 859 but a CT scan of the abdomen was normal. Pancreatitis was attributed to Azythromucin. An ultrasound of the gallbladder was normal. She has been treated for fibromyalgia by Dr.Deveshwar. She has a lot of anxiety with regards to her husband's illness and her 69 yo  son financial dependence. A prior endoscopy in 2010 and in September 2013 showed esophagitis with erosions, 2 cm hiatal hernia and mild esophageal stricture which was dilated by Dr. Sharlett Iles to 17 mm. She currently  does not take any acid reducing agents.The last screening colonoscopy was in September 2013 and it was  a normal exam. She has a family history of colon cancer in her maternal aunt. Patient denies drinking alcohol.     Past Medical History  Diagnosis Date  . Cataracts, bilateral   . Degenerative disc disease   . Bunion   . Ankle fracture     Stress fracture  . Lacunar stroke   . IBS (irritable bowel syndrome)   . Depression   . Anxiety   . GERD (gastroesophageal reflux disease)   . Diverticulosis of colon (without mention of hemorrhage) 2010    Colonoscopy  . Stricture and stenosis of esophagus 2005,2010    EGD   . Anal polyp 1998    Flex Sig   . Internal hemorrhoids without mention of complication 5852,7782    Colonoscopy   . External hemorrhoids 2000    Colonoscopy  . Family history of malignant neoplasm of gastrointestinal tract   . Hyperlipemia   . Fibromyalgia   . Hiatal hernia 2005,2010    EGD  .  Hypothyroidism   . Allergy   . Anemia   . Asthma     border line has inhaler  . Stroke   . Irregular heart beat   . Pseudophakia, both eyes   . Angular blepharitis of left eye   . Retinal scar     left  . PCO (posterior capsular opacification)     left  . Dry eyes     bilateral  . Corneal scar     left eye  . PVD (posterior vitreous detachment) right  . Rotator cuff disorder     pain, left shoulder  . Migraine   . Anxiety   . IBS (irritable bowel syndrome)   . Pancreatitis   . Osteopenia 10/2013    T score -1.6 FRAX 10%/1.6%    Past Surgical History  Procedure Laterality Date  . Total abdominal hysterectomy w/ bilateral salpingoophorectomy  1991    TAH BSO  . Tonsillectomy    . Breast lumpectomy Left     Benign  . Replacement total knee Right 2011  . Eye surgery Left   . Total knee arthroplasty      both  . Cataract extraction Bilateral 2013  . Nasal septum surgery    . Mouth surgery  11/13/11    Cyst removed from gum-benign   . Colonoscopy    . Nasal septum surgery  1970's  . Esophageal dilation      Allergies  Allergen Reactions  . Azithromycin  Other (See Comments)    Pancreatitis  . Erythromycin Hives  . Atorvastatin Calcium [Atorvastatin] Other (See Comments)    Muscle weakness   . Augmentin [Amoxicillin-Pot Clavulanate] Nausea And Vomiting  . Clindamycin/Lincomycin     Stomach upset   . Ketoconazole Nausea And Vomiting  . Morphine Sulfate Other (See Comments)    Unknown   . Septra [Bactrim]     Upset stomach  . Simvastatin     Muscle pain    Family history and social history have been reviewed.  Review of Systems: Denies dysphagia. Diarrhea this morning. No rectal bleeding   The remainder of the 10 point ROS is negative except as outlined in the H&P  Physical Exam: General Appearance Well developed, in no distress Eyes  Non icteric  HEENT  Non traumatic, normocephalic  Mouth No lesion, tongue papillated, no cheilosis Neck Supple  without adenopathy, thyroid not enlarged, no carotid bruits, no JVD Lungs Clear to auscultation bilaterally COR Normal S1, normal S2, regular rhythm, no murmur, quiet precordium Abdomen  soft with normoactive bowel sounds. No particular tenderness. Liver edge at costal margin. Horizontal scar lower abdomen  Rectal  normal rectal sphincter tone. Small amount of Hemoccult negative stool  Extremities  No pedal edema Skin No lesions Neurological Alert and oriented x 3 Psychological Normal mood and affect  Assessment and Plan:   Problem #18 73 year old white female with episodes of abdominal pain, bloating and nausea which resemble  an episode of pancreatitis earlier this year. Her lab results today are completely normal including amylase and lipase as well as CBC and liver function tests. Her abdominal exam is also unremarkable. I think we are dealing with an irritable bowel syndrome. She has a lot of anxiety and would appreciate a minor tranquilizer to use on an as necessary basis. We will proceed with an ultrasound of the upper abdomen to follow up on the pancreatitis. We will also start Prilosec 20 mg daily and Xanax 0.25 mg for her to take one half to one tablet as needed every 8 hours when necessary for anxiety.   Problem #2 Colorectal screening. Her next colonoscopy recall will be due in September 2023.    Delfin Edis 03/06/2014

## 2014-03-11 ENCOUNTER — Ambulatory Visit (HOSPITAL_COMMUNITY)
Admission: RE | Admit: 2014-03-11 | Discharge: 2014-03-11 | Disposition: A | Discharge status: Home / Self Care | Payer: Medicare Other | Source: Ambulatory Visit | Attending: Internal Medicine | Admitting: Internal Medicine

## 2014-03-11 ENCOUNTER — Telehealth: Payer: Self-pay | Admitting: Internal Medicine

## 2014-03-11 DIAGNOSIS — Z8719 Personal history of other diseases of the digestive system: Secondary | ICD-10-CM

## 2014-03-11 DIAGNOSIS — R109 Unspecified abdominal pain: Secondary | ICD-10-CM

## 2014-03-11 MED ORDER — LINACLOTIDE 145 MCG PO CAPS: 145.0000 ug | ORAL_CAPSULE | Freq: Every day | ORAL | Status: DC

## 2014-03-11 NOTE — Telephone Encounter (Signed)
Patient notified Rx sent

## 2014-03-11 NOTE — Telephone Encounter (Signed)
Patient states she had ultrasound done this AM. She is calling with IBS issues over the weekend. States on Thursday through Sunday, she has had constipation and she used a stool softener and Metamucil. Then, she had loose stool to clear mucous with occasional bright, red blood streaks. She is not sure what to do for IBS symptoms. She is asking if there is a medication she can try. Please, advise.

## 2014-03-11 NOTE — Telephone Encounter (Signed)
Try Linzess 145 ug 1 po qd, #30. 3 refills.

## 2014-03-21 ENCOUNTER — Telehealth: Payer: Self-pay | Admitting: Internal Medicine

## 2014-03-21 NOTE — Telephone Encounter (Signed)
Spoke with patient and she states the Linzess make her cramping worse and she had diarrhea with clear mucous. She stopped the Linzess and started Metamucil BID, increased her fluids and fiber. This has helped and she is having small normal bowel movements. She asked many questions about how to manage constipation. She will try Miralax prn or Linzess every other day.

## 2014-03-21 NOTE — Telephone Encounter (Signed)
That is OK. I am glad she has found herself what works the best.  I agree with fiber and Miralax.

## 2014-04-01 ENCOUNTER — Telehealth: Payer: Self-pay | Admitting: Internal Medicine

## 2014-04-01 MED ORDER — DICYCLOMINE HCL 10 MG PO CAPS: 10.0000 mg | ORAL_CAPSULE | Freq: Three times a day (TID) | ORAL | Status: DC

## 2014-04-01 NOTE — Telephone Encounter (Signed)
Patient calling to report she is still having problems with IBS. States she is having "bouts of bloating and cramping." States sometimes she has blood and mucous in stool and when she has gas. Wakes up at night with cramps. States she has a gnawing pain that stays in lower abdomen. She did take Miralax and had a bowel movement yesterday. Please, advise.

## 2014-04-01 NOTE — Telephone Encounter (Signed)
Please send Bentyl 10 mg #30, 1 po tid ac, 1 refill.

## 2014-04-01 NOTE — Telephone Encounter (Signed)
Rx sent to pharmacy. Patient notified. Patient wants to schedule f/u OV. Scheduled on 05/14/14 at 2:00 PM.

## 2014-04-09 ENCOUNTER — Ambulatory Visit
Admission: RE | Admit: 2014-04-09 | Discharge: 2014-04-09 | Disposition: A | Discharge status: Home / Self Care | Payer: Medicare Other | Source: Ambulatory Visit

## 2014-04-09 DIAGNOSIS — Z1231 Encounter for screening mammogram for malignant neoplasm of breast: Secondary | ICD-10-CM

## 2014-05-03 ENCOUNTER — Telehealth: Payer: Self-pay | Admitting: Internal Medicine

## 2014-05-03 NOTE — Telephone Encounter (Signed)
Steroid injection in her knees is OK, should not cause pancreatitis.

## 2014-05-03 NOTE — Telephone Encounter (Signed)
I have advised patient of Dr Nichola Sizer recommendations and she verbalizes understanding.

## 2014-05-03 NOTE — Telephone Encounter (Signed)
Patient is going to see Dr Noemi Chapel next week for left knee swelling. She wants to know if he wants to inject it with steroid if it will be ok. Please, advise.

## 2014-05-14 ENCOUNTER — Ambulatory Visit (INDEPENDENT_AMBULATORY_CARE_PROVIDER_SITE_OTHER): Payer: Medicare Other | Admitting: Internal Medicine

## 2014-05-14 ENCOUNTER — Encounter: Payer: Self-pay | Admitting: Internal Medicine

## 2014-05-14 VITALS — BP 142/80 | Ht 66.5 in | Wt 181.8 lb

## 2014-05-14 DIAGNOSIS — K589 Irritable bowel syndrome without diarrhea: Secondary | ICD-10-CM

## 2014-05-14 NOTE — Patient Instructions (Addendum)
CC: Dr Noemi Chapel, Dr Felipa Eth

## 2014-05-14 NOTE — Progress Notes (Signed)
KALIYAN OSBOURN 11-07-1940 [id number]  Note: This dictation was prepared with Dragon digital system. Any transcriptional errors that result from this procedure are unintentional.   History of Present Illness:  This is a 73 year old white female who , this is a follow-up visit from 03/06/2014 for abdominal pain  similar to prior episode of pancreatitis for which she was hospitalized in February 2015 when her lipase was 859.  CT scan of the abdomen at that time showed a normal-appearing pancreas. The pancreatitis was attributed to Azythromycin. Patient has been on Prilosec every other day and Xanax for stress reduction. An upper endoscopy and colonoscopy in September 2013 showed a 2 cm hiatal hernia with mild stricture which was dilated to 17 mm and a normal colon to the cecum. Her maternal aunt had colon cancer. She has no complaints today. 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 She takes occasional Aleve. She is on probiotics which have been regulating her bowel habits.    Past Medical History  Diagnosis Date  . Cataracts, bilateral   . Degenerative disc disease   . Bunion   . Ankle fracture     Stress fracture  . Lacunar stroke   . IBS (irritable bowel syndrome)   . Depression   . Anxiety   . GERD (gastroesophageal reflux disease)   . Diverticulosis of colon (without mention of hemorrhage) 2010    Colonoscopy  . Stricture and stenosis of esophagus 2005,2010    EGD   . Anal polyp 1998    Flex Sig   . Internal hemorrhoids without mention of complication 4098,1191    Colonoscopy   . External hemorrhoids 2000    Colonoscopy  . Family history of malignant neoplasm of gastrointestinal tract   . Hyperlipemia   . Fibromyalgia   . Hiatal hernia 2005,2010    EGD  . Hypothyroidism   . Allergy   . Anemia   . Asthma     border line has inhaler  . Stroke   . Irregular heart beat   . Pseudophakia, both eyes   . Angular  blepharitis of left eye   . Retinal scar     left  . PCO (posterior capsular opacification)     left  . Dry eyes     bilateral  . Corneal scar     left eye  . PVD (posterior vitreous detachment) right  . Rotator cuff disorder     pain, left shoulder  . Migraine   . Anxiety   . IBS (irritable bowel syndrome)   . Pancreatitis   . Osteopenia 10/2013    T score -1.6 FRAX 10%/1.6%    Past Surgical History  Procedure Laterality Date  . Total abdominal hysterectomy w/ bilateral salpingoophorectomy  1991    TAH BSO  . Tonsillectomy    . Breast lumpectomy Left     Benign  . Replacement total knee Right 2011  . Eye surgery Left   . Total knee arthroplasty      both  . Cataract extraction Bilateral 2013  . Nasal septum surgery    . Mouth surgery  11/13/11    Cyst removed from gum-benign   . Colonoscopy    . Nasal septum surgery  1970's  . Esophageal dilation      Allergies  Allergen Reactions  . Azithromycin Other (See Comments)    Pancreatitis  . Erythromycin Hives  . Atorvastatin Calcium [Atorvastatin] Other (See Comments)  Muscle weakness   . Augmentin [Amoxicillin-Pot Clavulanate] Nausea And Vomiting  . Clindamycin/Lincomycin     Stomach upset   . Ketoconazole Nausea And Vomiting  . Morphine Sulfate Other (See Comments)    Unknown   . Septra [Bactrim]     Upset stomach  . Simvastatin     Muscle pain    Family history and social history have been reviewed.  Review of Systems: Denies nausea, heartburn, abdominal pain  The remainder of the 10 point ROS is negative except as outlined in the H&P  Physical Exam: General Appearance Well developed, in no distress Psychological Normal mood and affect  Assessment and Plan:   Problem #15 73 year old white female with irritable bowel syndrome and hospitalization for pancreatitis of unknown etiology. She is doing well from that respect. She will continue a high-fiber diet and probiotics. She may take Prilosec when  necessary and Aleve for knee pain. She is up-to-date on her colonoscopy.    Delfin Edis 05/14/2014

## 2014-06-16 ENCOUNTER — Emergency Department (HOSPITAL_BASED_OUTPATIENT_CLINIC_OR_DEPARTMENT_OTHER)
Admission: EM | Admit: 2014-06-16 | Discharge: 2014-06-17 | Disposition: A | Discharge status: Home / Self Care | Payer: Medicare Other | Attending: Emergency Medicine | Admitting: Emergency Medicine

## 2014-06-16 ENCOUNTER — Encounter (HOSPITAL_BASED_OUTPATIENT_CLINIC_OR_DEPARTMENT_OTHER): Payer: Self-pay | Admitting: *Deleted

## 2014-06-16 DIAGNOSIS — Z79899 Other long term (current) drug therapy: Secondary | ICD-10-CM | POA: Diagnosis not present

## 2014-06-16 DIAGNOSIS — M8580 Other specified disorders of bone density and structure, unspecified site: Secondary | ICD-10-CM | POA: Diagnosis not present

## 2014-06-16 DIAGNOSIS — J45909 Unspecified asthma, uncomplicated: Secondary | ICD-10-CM | POA: Insufficient documentation

## 2014-06-16 DIAGNOSIS — Z87891 Personal history of nicotine dependence: Secondary | ICD-10-CM | POA: Insufficient documentation

## 2014-06-16 DIAGNOSIS — R109 Unspecified abdominal pain: Secondary | ICD-10-CM | POA: Diagnosis present

## 2014-06-16 DIAGNOSIS — Z7951 Long term (current) use of inhaled steroids: Secondary | ICD-10-CM | POA: Insufficient documentation

## 2014-06-16 DIAGNOSIS — Z7982 Long term (current) use of aspirin: Secondary | ICD-10-CM | POA: Insufficient documentation

## 2014-06-16 DIAGNOSIS — K219 Gastro-esophageal reflux disease without esophagitis: Secondary | ICD-10-CM | POA: Insufficient documentation

## 2014-06-16 DIAGNOSIS — E785 Hyperlipidemia, unspecified: Secondary | ICD-10-CM | POA: Insufficient documentation

## 2014-06-16 DIAGNOSIS — Z87828 Personal history of other (healed) physical injury and trauma: Secondary | ICD-10-CM | POA: Diagnosis not present

## 2014-06-16 DIAGNOSIS — Z8669 Personal history of other diseases of the nervous system and sense organs: Secondary | ICD-10-CM | POA: Diagnosis not present

## 2014-06-16 DIAGNOSIS — Z862 Personal history of diseases of the blood and blood-forming organs and certain disorders involving the immune mechanism: Secondary | ICD-10-CM | POA: Insufficient documentation

## 2014-06-16 DIAGNOSIS — Z8659 Personal history of other mental and behavioral disorders: Secondary | ICD-10-CM | POA: Insufficient documentation

## 2014-06-16 DIAGNOSIS — E039 Hypothyroidism, unspecified: Secondary | ICD-10-CM | POA: Diagnosis not present

## 2014-06-16 DIAGNOSIS — R1012 Left upper quadrant pain: Secondary | ICD-10-CM | POA: Insufficient documentation

## 2014-06-16 DIAGNOSIS — Z8673 Personal history of transient ischemic attack (TIA), and cerebral infarction without residual deficits: Secondary | ICD-10-CM | POA: Insufficient documentation

## 2014-06-16 DIAGNOSIS — Z8679 Personal history of other diseases of the circulatory system: Secondary | ICD-10-CM | POA: Diagnosis not present

## 2014-06-16 LAB — URINALYSIS, ROUTINE W REFLEX MICROSCOPIC
Bilirubin Urine: NEGATIVE
Glucose, UA: NEGATIVE mg/dL
Ketones, ur: NEGATIVE mg/dL
Leukocytes, UA: NEGATIVE
Nitrite: NEGATIVE
Protein, ur: NEGATIVE mg/dL
Specific Gravity, Urine: 1.011 (ref 1.005–1.030)
Urobilinogen, UA: 0.2 mg/dL (ref 0.0–1.0)
pH: 6.5 (ref 5.0–8.0)

## 2014-06-16 LAB — CBC WITH DIFFERENTIAL/PLATELET
Basophils Absolute: 0 10*3/uL (ref 0.0–0.1)
Basophils Relative: 0 % (ref 0–1)
Eosinophils Absolute: 0.2 10*3/uL (ref 0.0–0.7)
Eosinophils Relative: 2 % (ref 0–5)
HCT: 38.9 % (ref 36.0–46.0)
Hemoglobin: 13 g/dL (ref 12.0–15.0)
Lymphocytes Relative: 34 % (ref 12–46)
Lymphs Abs: 2.7 10*3/uL (ref 0.7–4.0)
MCH: 31.1 pg (ref 26.0–34.0)
MCHC: 33.4 g/dL (ref 30.0–36.0)
MCV: 93.1 fL (ref 78.0–100.0)
Monocytes Absolute: 0.7 10*3/uL (ref 0.1–1.0)
Monocytes Relative: 9 % (ref 3–12)
Neutro Abs: 4.5 10*3/uL (ref 1.7–7.7)
Neutrophils Relative %: 55 % (ref 43–77)
Platelets: 217 10*3/uL (ref 150–400)
RBC: 4.18 MIL/uL (ref 3.87–5.11)
RDW: 12.7 % (ref 11.5–15.5)
WBC: 8 10*3/uL (ref 4.0–10.5)

## 2014-06-16 LAB — COMPREHENSIVE METABOLIC PANEL
ALT: 17 U/L (ref 0–35)
AST: 24 U/L (ref 0–37)
Albumin: 4.7 g/dL (ref 3.5–5.2)
Alkaline Phosphatase: 48 U/L (ref 39–117)
Anion gap: 5 (ref 5–15)
BUN: 18 mg/dL (ref 6–23)
CO2: 27 mmol/L (ref 19–32)
Calcium: 9.1 mg/dL (ref 8.4–10.5)
Chloride: 103 mEq/L (ref 96–112)
Creatinine, Ser: 0.61 mg/dL (ref 0.50–1.10)
GFR calc Af Amer: >90 mL/min (ref >90)
GFR calc non Af Amer: 88 mL/min — ABNORMAL LOW (ref >90)
Glucose, Bld: 131 mg/dL — ABNORMAL HIGH (ref 70–99)
Potassium: 3.3 mmol/L — ABNORMAL LOW (ref 3.5–5.1)
Sodium: 135 mmol/L (ref 135–145)
Total Bilirubin: 0.3 mg/dL (ref 0.3–1.2)
Total Protein: 7.2 g/dL (ref 6.0–8.3)

## 2014-06-16 LAB — LIPASE, BLOOD: Lipase: 43 U/L (ref 11–59)

## 2014-06-16 LAB — URINE MICROSCOPIC-ADD ON

## 2014-06-16 MED ORDER — PANTOPRAZOLE SODIUM 40 MG IV SOLR
40.0000 mg | Freq: Once | INTRAVENOUS | Status: AC
  Administered 2014-06-16: 40 mg via INTRAVENOUS
  Filled 2014-06-16: qty 40

## 2014-06-16 NOTE — ED Notes (Signed)
Pt reports not feeling well x 2 days- today c/o chills, nausea, and LUQ pain

## 2014-06-16 NOTE — ED Provider Notes (Signed)
CSN: [id number]     Arrival date & time 06/16/14  2224 History  This chart was scribed for Wynetta Fines, MD by Chester Holstein, ED Scribe. This patient was seen in room MH02/MH02 and the patient's care was started at 10:50 PM.     Chief Complaint  Patient presents with  . Abdominal Pain     HPI  HPI Comments: Kimberly [name] is a 74 y.o. female with PMHx of CVA, diverticulosis, pancreatitis, GERD, IBS and vertigo who presents to the Emergency Department complaining of 4-5/10 intermittent LUQ abdominal pain with onset 2 days ago. There are no specific exacerbating or mitigating factors. Pt notes associated dizziness (vertigo) this afternoon, nausea and heartburn consistent with previous GERD. Pt notes frequent burping. Pt states symptoms have resolved. Pt denies vomiting, diarrhea, fever, chills, chest pain, and SOB.   Past Medical History  Diagnosis Date  . Cataracts, bilateral   . Degenerative disc disease   . Bunion   . Ankle fracture     Stress fracture  . Lacunar stroke   . IBS (irritable bowel syndrome)   . Depression   . Anxiety   . GERD (gastroesophageal reflux disease)   . Diverticulosis of colon (without mention of hemorrhage) 2010    Colonoscopy  . Stricture and stenosis of esophagus 2005,2010    EGD   . Anal polyp 1998    Flex Sig   . Internal hemorrhoids without mention of complication 3614,4315    Colonoscopy   . External hemorrhoids 2000    Colonoscopy  . Family history of malignant neoplasm of gastrointestinal tract   . Hyperlipemia   . Fibromyalgia   . Hiatal hernia 2005,2010    EGD  . Hypothyroidism   . Allergy   . Anemia   . Asthma     border line has inhaler  . Stroke   . Irregular heart beat   . Pseudophakia, both eyes   . Angular blepharitis of left eye   . Retinal scar     left  . PCO (posterior capsular opacification)     left  . Dry eyes     bilateral  . Corneal scar     left eye  . PVD (posterior vitreous detachment) right  . Rotator  cuff disorder     pain, left shoulder  . Migraine   . Anxiety   . IBS (irritable bowel syndrome)   . Pancreatitis   . Osteopenia 10/2013    T score -1.6 FRAX 10%/1.6%   Past Surgical History  Procedure Laterality Date  . Total abdominal hysterectomy w/ bilateral salpingoophorectomy  1991    TAH BSO  . Tonsillectomy    . Breast lumpectomy Left     Benign  . Replacement total knee Right 2011  . Eye surgery Left   . Total knee arthroplasty      both  . Cataract extraction Bilateral 2013  . Nasal septum surgery    . Mouth surgery  11/13/11    Cyst removed from gum-benign   . Colonoscopy    . Nasal septum surgery  1970's  . Esophageal dilation     Family History  Problem Relation Age of Onset  . Dementia Mother   . Irritable bowel syndrome Mother   . Hypertension Father   . Congestive Heart Failure Father   . Diabetes Sister   . Colon cancer Paternal Aunt   . Esophageal cancer Neg Hx   . Rectal cancer Neg Hx   .  Stomach cancer Neg Hx   . Heart disease Maternal Grandmother   . Alcohol abuse Father   . Alzheimer's disease Mother   . Heart attack Paternal Grandfather   . Heart attack Maternal Grandmother   . Heart attack Maternal Grandfather   . Arthritis Brother   . Other Sister     pituitary disease  . Allergies Sister   . Other Sister     joint problems  . Stroke Mother    History  Substance Use Topics  . Smoking status: Former Research scientist (life sciences)  . Smokeless tobacco: Never Used     Comment: Stopped smoking age 80  . Alcohol Use: No   OB History    Gravida Para Term Preterm AB TAB SAB Ectopic Multiple Living   0              Review of SystemsA complete 10 system review of systems was obtained and all systems are negative except as noted in the HPI and PMH.      Allergies  Azithromycin; Erythromycin; Atorvastatin calcium; Augmentin; Clindamycin/lincomycin; Ketoconazole; Morphine sulfate; Septra; and Simvastatin  Home Medications   Prior to Admission medications    Medication Sig Start Date End Date Taking? Authorizing Provider  Albuterol Sulfate (PROAIR HFA IN) Inhale 1-2 puffs into the lungs as needed (Wheezing).    Yes Historical Provider, MD  aspirin 81 MG tablet Take 81 mg by mouth daily.   Yes Historical Provider, MD  Calcium Carbonate-Vitamin D (CALCIUM + D PO) Take 1 tablet by mouth daily.    Yes Historical Provider, MD  Docusate Calcium (STOOL SOFTENER PO) Take 1 capsule by mouth as needed (constipation).    Yes Historical Provider, MD  fluticasone (FLONASE) 50 MCG/ACT nasal spray Place 1 spray into both nostrils 3 (three) times a week.  06/06/13  Yes Historical Provider, MD  levothyroxine (SYNTHROID, LEVOTHROID) 125 MCG tablet Take 111 mcg by mouth daily before breakfast.    Yes Historical Provider, MD  omeprazole (PRILOSEC) 20 MG capsule Take 1 capsule (20 mg total) by mouth daily. 03/06/14  Yes Lafayette Dragon, MD  Polyethylene Glycol 3350 (MIRALAX PO) Take 17 g by mouth daily as needed (constipation).    Yes Historical Provider, MD  RESTASIS 0.05 % ophthalmic emulsion Place 1 drop into both eyes daily. Uses as directed 09/30/12  Yes Historical Provider, MD  rosuvastatin (CRESTOR) 5 MG tablet Take 10 mg by mouth as directed.    Yes Historical Provider, MD  SIMPLY SALINE NA Place into the nose as needed.   Yes Historical Provider, MD  montelukast (SINGULAIR) 10 MG tablet Take 10 mg by mouth daily.  10/01/13   Historical Provider, MD  Multiple Vitamin (MULTIVITAMIN) tablet Take 1 tablet by mouth daily. Chewable    Historical Provider, MD  Thiamine Mononitrate (VITAMIN B1 PO) Take 1 capsule by mouth daily.    Historical Provider, MD   BP 134/56 mmHg  Pulse 65  Temp(Src) 97.6 F (36.4 C) (Oral)  Resp 13  Ht 5\' 6"  (1.676 m)  Wt 178 lb (80.74 kg)  BMI 28.74 kg/m2  SpO2 96%   Physical Exam  Nursing note and vitals reviewed.   General: Well-developed, well-nourished female in no acute distress; appearance consistent with age of record HENT:  normocephalic; atraumatic Eyes: pupils equal, round and reactive to light; extraocular muscles intact; lens implants Neck: supple Heart: regular rate and rhythm; no murmur Lungs: clear to auscultation bilaterally Abdomen: soft; nondistended; mild epigastric tenderness; no masses or hepatosplenomegaly; bowel  sounds present Extremities: No deformity; full range of motion; pulses normal Neurologic: Awake, alert and oriented; motor function intact in all extremities and symmetric; no facial droop Skin: Warm and dry Psychiatric: Normal mood and affect    ED Course  Procedures (including critical care time) DIAGNOSTIC STUDIES: Oxygen Saturation is 96% on room air, normal by my interpretation.    COORDINATION OF CARE: 10:57 PM Discussed treatment plan with patient at beside, the patient agrees with the plan and has no further questions at this time.     MDM   Nursing notes and vitals signs, including pulse oximetry, reviewed.  Summary of this visit's results, reviewed by myself:   EKG Interpretation  Date/Time:  Sunday June 16 2014 22:50:38 EST Ventricular Rate:  77 PR Interval:  212 QRS Duration: 88 QT Interval:  404 QTC Calculation: 457 R Axis:   -26 Text Interpretation:  Sinus rhythm with 1st degree A-V block Nonspecific ST abnormality Abnormal ECG Rate is slower Confirmed by Titan Karner  MD, Jenny Reichmann ([id number]) on 06/16/2014 10:59:24 PM      Labs:  Results for orders placed or performed during the hospital encounter of 06/16/14 (from the past 24 hour(s))  Urinalysis, Routine w reflex microscopic     Status: Abnormal   Collection Time: 06/16/14 10:34 PM  Result Value Ref Range   Color, Urine YELLOW YELLOW   APPearance CLEAR CLEAR   Specific Gravity, Urine 1.011 1.005 - 1.030   pH 6.5 5.0 - 8.0   Glucose, UA NEGATIVE NEGATIVE mg/dL   Hgb urine dipstick SMALL (A) NEGATIVE   Bilirubin Urine NEGATIVE NEGATIVE   Ketones, ur NEGATIVE NEGATIVE mg/dL   Protein, ur NEGATIVE NEGATIVE  mg/dL   Urobilinogen, UA 0.2 0.0 - 1.0 mg/dL   Nitrite NEGATIVE NEGATIVE   Leukocytes, UA NEGATIVE NEGATIVE  Urine microscopic-add on     Status: None   Collection Time: 06/16/14 10:34 PM  Result Value Ref Range   Squamous Epithelial / LPF RARE RARE   WBC, UA 0-2 <3 WBC/hpf   RBC / HPF 3-6 <3 RBC/hpf   Bacteria, UA RARE RARE  CBC with Differential     Status: None   Collection Time: 06/16/14 11:00 PM  Result Value Ref Range   WBC 8.0 4.0 - 10.5 K/uL   RBC 4.18 3.87 - 5.11 MIL/uL   Hemoglobin 13.0 12.0 - 15.0 g/dL   HCT 38.9 36.0 - 46.0 %   MCV 93.1 78.0 - 100.0 fL   MCH 31.1 26.0 - 34.0 pg   MCHC 33.4 30.0 - 36.0 g/dL   RDW 12.7 11.5 - 15.5 %   Platelets 217 150 - 400 K/uL   Neutrophils Relative % 55 43 - 77 %   Neutro Abs 4.5 1.7 - 7.7 K/uL   Lymphocytes Relative 34 12 - 46 %   Lymphs Abs 2.7 0.7 - 4.0 K/uL   Monocytes Relative 9 3 - 12 %   Monocytes Absolute 0.7 0.1 - 1.0 K/uL   Eosinophils Relative 2 0 - 5 %   Eosinophils Absolute 0.2 0.0 - 0.7 K/uL   Basophils Relative 0 0 - 1 %   Basophils Absolute 0.0 0.0 - 0.1 K/uL  Comprehensive metabolic panel     Status: Abnormal   Collection Time: 06/16/14 11:00 PM  Result Value Ref Range   Sodium 135 135 - 145 mmol/L   Potassium 3.3 (L) 3.5 - 5.1 mmol/L   Chloride 103 96 - 112 mEq/L   CO2 27 19 - 32  mmol/L   Glucose, Bld 131 (H) 70 - 99 mg/dL   BUN 18 6 - 23 mg/dL   Creatinine, Ser 0.61 0.50 - 1.10 mg/dL   Calcium 9.1 8.4 - 10.5 mg/dL   Total Protein 7.2 6.0 - 8.3 g/dL   Albumin 4.7 3.5 - 5.2 g/dL   AST 24 0 - 37 U/L   ALT 17 0 - 35 U/L   Alkaline Phosphatase 48 39 - 117 U/L   Total Bilirubin 0.3 0.3 - 1.2 mg/dL   GFR calc non Af Amer 88 (L) >90 mL/min   GFR calc Af Amer >90 >90 mL/min   Anion gap 5 5 - 15  Lipase, blood     Status: None   Collection Time: 06/16/14 11:00 PM  Result Value Ref Range   Lipase 43 11 - 59 U/L  Troponin I     Status: None   Collection Time: 06/17/14 12:00 AM  Result Value Ref Range    Troponin I <0.03 <0.031 ng/mL   12:51 AM Patient continues to be pain-free with a soft nontender abdomen.  I personally performed the services described in this documentation, which was scribed in my presence. The recorded information has been reviewed and is accurate.   Wynetta Fines, MD 06/17/14 [phone]

## 2014-06-17 LAB — TROPONIN I: Troponin I: <0.03 ng/mL (ref <0.031)

## 2014-06-17 MED ORDER — ONDANSETRON 8 MG PO TBDP: 8.0000 mg | ORAL_TABLET | Freq: Three times a day (TID) | ORAL | Status: DC | PRN

## 2014-06-17 MED ORDER — ONDANSETRON HCL 4 MG/2ML IJ SOLN
4.0000 mg | Freq: Once | INTRAMUSCULAR | Status: AC
  Administered 2014-06-17: 4 mg via INTRAVENOUS
  Filled 2014-06-17: qty 2

## 2014-06-17 NOTE — Discharge Instructions (Signed)

## 2014-06-17 NOTE — ED Notes (Signed)
Pt c/o feeling "a little" nauseated at d/c. No vomiting.  MD aware and orders received.

## 2014-07-03 ENCOUNTER — Other Ambulatory Visit: Payer: Self-pay | Admitting: Internal Medicine

## 2014-08-01 ENCOUNTER — Ambulatory Visit (INDEPENDENT_AMBULATORY_CARE_PROVIDER_SITE_OTHER): Payer: Medicare Other

## 2014-08-01 ENCOUNTER — Ambulatory Visit (INDEPENDENT_AMBULATORY_CARE_PROVIDER_SITE_OTHER): Payer: Medicare Other | Admitting: Podiatry

## 2014-08-01 ENCOUNTER — Encounter: Payer: Self-pay | Admitting: Podiatry

## 2014-08-01 VITALS — BP 157/80 | HR 77 | Resp 12

## 2014-08-01 DIAGNOSIS — R52 Pain, unspecified: Secondary | ICD-10-CM | POA: Diagnosis not present

## 2014-08-01 DIAGNOSIS — M2040 Other hammer toe(s) (acquired), unspecified foot: Secondary | ICD-10-CM | POA: Diagnosis not present

## 2014-08-01 DIAGNOSIS — Q786 Multiple congenital exostoses: Secondary | ICD-10-CM | POA: Diagnosis not present

## 2014-08-01 DIAGNOSIS — M898X0 Other specified disorders of bone, multiple sites: Secondary | ICD-10-CM

## 2014-08-01 DIAGNOSIS — L84 Corns and callosities: Secondary | ICD-10-CM

## 2014-08-01 NOTE — Progress Notes (Signed)
Subjective:     Patient ID: Kimberly [name], female   DOB: [birth year], 74 y.o.   MRN: [id number]  HPI patient points to right fifth toe stating the corn callus has been sore and also states that she still has her hammertoe deformity   Review of Systems     Objective:   Physical Exam Neurovascular status intact with structural digital deformities noted of both feet and bunion deformity right along with keratotic lesion on the inside of the fifth toe right that's painful    Assessment:    exostosis fifth digit right foot with keratotic lesion formation and hammertoe deformity lesser digits bilateral along with structural bunion deformity right    Plan:     Reviewed other conditions and considerations for surgery on the right foot and today I debrided the lesion fully applied padding and explained I want to see her back when symptoms were to reoccur

## 2014-08-01 NOTE — Progress Notes (Signed)
   Subjective:    Patient ID: Kimberly [name], female    DOB: May 11, 1941, 74 y.o.   MRN: [id number]  HPI Patient stated that right foot 5th toe is hurting, going on for a week and a half, not getting better or any worse.   Review of Systems  Allergic/Immunologic: Positive for environmental allergies.       Objective:   Physical Exam        Assessment & Plan:

## 2014-08-22 ENCOUNTER — Ambulatory Visit: Payer: Medicare Other | Admitting: Women's Health

## 2014-08-26 ENCOUNTER — Ambulatory Visit: Payer: Medicare Other | Admitting: Gynecology

## 2014-08-27 ENCOUNTER — Encounter: Payer: Self-pay | Admitting: Women's Health

## 2014-08-27 ENCOUNTER — Ambulatory Visit (INDEPENDENT_AMBULATORY_CARE_PROVIDER_SITE_OTHER): Payer: Medicare Other | Admitting: Women's Health

## 2014-08-27 VITALS — BP 124/80 | Ht 66.0 in | Wt 184.0 lb

## 2014-08-27 DIAGNOSIS — R350 Frequency of micturition: Secondary | ICD-10-CM | POA: Diagnosis not present

## 2014-08-27 DIAGNOSIS — N898 Other specified noninflammatory disorders of vagina: Secondary | ICD-10-CM

## 2014-08-27 LAB — WET PREP FOR TRICH, YEAST, CLUE
Clue Cells Wet Prep HPF POC: NONE SEEN
Trich, Wet Prep: NONE SEEN

## 2014-08-27 LAB — URINALYSIS W MICROSCOPIC + REFLEX CULTURE
Bilirubin Urine: NEGATIVE
Casts: NONE SEEN
Crystals: NONE SEEN
Glucose, UA: NEGATIVE mg/dL
Ketones, ur: NEGATIVE mg/dL
Leukocytes, UA: NEGATIVE
Nitrite: NEGATIVE
Protein, ur: NEGATIVE mg/dL
Specific Gravity, Urine: <1.005 — ABNORMAL LOW (ref 1.005–1.030)
Urobilinogen, UA: 0.2 mg/dL (ref 0.0–1.0)
pH: 6.5 (ref 5.0–8.0)

## 2014-08-27 MED ORDER — NYSTATIN 100000 UNIT/GM EX CREA: TOPICAL_CREAM | Freq: Two times a day (BID) | CUTANEOUS | Status: DC

## 2014-08-27 NOTE — Addendum Note (Signed)
Addended by: Burnett Kanaris on: 08/27/2014 02:05 PM   Modules accepted: Orders

## 2014-08-27 NOTE — Patient Instructions (Signed)

## 2014-08-27 NOTE — Progress Notes (Signed)
Patient ID: Kimberly [name], female   DOB: Sep 29, 1940, 74 y.o.   MRN: [id number] Presents with complaint of increased urinary frequency with urgency, no pain or burning. Mild external vaginal itching with no discharge. Denies abdominal pain or fever. Numerous allergies.  Exam: Appears well. External genitalia mild erythema, speculum exam scant discharge minimal erythema, wet prep positive for few yeast. Bimanual no tenderness. UA trace blood, 0-2 WBCs, 3-6 RBCs, rare bacteria.  Yeast vaginitis Urinary frequency  Plan: Nystatin ointment externally, loose clothes, yeast prevention discussed. Urine culture pending. Increase fluids.

## 2014-08-28 LAB — URINE CULTURE: Colony Count: 2000

## 2014-08-29 ENCOUNTER — Encounter: Payer: Self-pay | Admitting: Women's Health

## 2014-09-26 ENCOUNTER — Ambulatory Visit (INDEPENDENT_AMBULATORY_CARE_PROVIDER_SITE_OTHER): Payer: Medicare Other | Admitting: Podiatry

## 2014-09-26 ENCOUNTER — Ambulatory Visit (INDEPENDENT_AMBULATORY_CARE_PROVIDER_SITE_OTHER): Payer: Medicare Other

## 2014-09-26 ENCOUNTER — Encounter: Payer: Self-pay | Admitting: Podiatry

## 2014-09-26 VITALS — BP 135/76 | HR 64 | Resp 12

## 2014-09-26 DIAGNOSIS — M79672 Pain in left foot: Secondary | ICD-10-CM | POA: Diagnosis not present

## 2014-09-26 DIAGNOSIS — L84 Corns and callosities: Secondary | ICD-10-CM | POA: Diagnosis not present

## 2014-09-26 DIAGNOSIS — M2040 Other hammer toe(s) (acquired), unspecified foot: Secondary | ICD-10-CM | POA: Diagnosis not present

## 2014-09-26 NOTE — Progress Notes (Signed)
   Subjective:    Patient ID: Kimberly [name], female    DOB: 1940-08-27, 74 y.o.   MRN: [id number]  HPI Pt stated injured the lt foot about 5 weeks ago and still painful. The foot is getting worse especially when putting pressure on it. Tried to elevated but nom help.   Review of Systems  Cardiovascular: Positive for leg swelling.       Objective:   Physical Exam        Assessment & Plan:

## 2014-09-26 NOTE — Patient Instructions (Signed)
Pre-Operative Instructions  Congratulations, you have decided to take an important step to improving your quality of life.  You can be assured that the doctors of [facility] will be with you every step of the way.  1. Plan to be at the surgery center/hospital at least 1 (one) hour prior to your scheduled time unless otherwise directed by the surgical center/hospital staff.  You must have a responsible adult accompany you, remain during the surgery and drive you home.  Make sure you have directions to the surgical center/hospital and know how to get there on time. 2. For hospital based surgery you will need to obtain a history and physical form from your family physician within 1 month prior to the date of surgery- we will give you a form for you primary physician.  3. We make every effort to accommodate the date you request for surgery.  There are however, times where surgery dates or times have to be moved.  We will contact you as soon as possible if a change in schedule is required.   4. No Aspirin/Ibuprofen for one week before surgery.  If you are on aspirin, any non-steroidal anti-inflammatory medications (Mobic, Aleve, Ibuprofen) you should stop taking it 7 days prior to your surgery.  You make take Tylenol  For pain prior to surgery.  5. Medications- If you are taking daily heart and blood pressure medications, seizure, reflux, allergy, asthma, anxiety, pain or diabetes medications, make sure the surgery center/hospital is aware before the day of surgery so they may notify you which medications to take or avoid the day of surgery. 6. No food or drink after midnight the night before surgery unless directed otherwise by surgical center/hospital staff. 7. No alcoholic beverages 24 hours prior to surgery.  No smoking 24 hours prior to or 24 hours after surgery. 8. Wear loose pants or shorts- loose enough to fit over bandages, boots, and casts. 9. No slip on shoes, sneakers are best. 10. Bring  your boot with you to the surgery center/hospital.  Also bring crutches or a walker if your physician has prescribed it for you.  If you do not have this equipment, it will be provided for you after surgery. 11. If you have not been contracted by the surgery center/hospital by the day before your surgery, call to confirm the date and time of your surgery. 12. Leave-time from work may vary depending on the type of surgery you have.  Appropriate arrangements should be made prior to surgery with your employer. 13. Prescriptions will be provided immediately following surgery by your doctor.  Have these filled as soon as possible after surgery and take the medication as directed. 14. Remove nail polish on the operative foot. 15. Wash the night before surgery.  The night before surgery wash the foot and leg well with the antibacterial soap provided and water paying special attention to beneath the toenails and in between the toes.  Rinse thoroughly with water and dry well with a towel.  Perform this wash unless told not to do so by your physician.  Enclosed: 1 Ice pack (please put in freezer the night before surgery)   1 Hibiclens skin cleaner   Pre-op Instructions  If you have any questions regarding the instructions, do not hesitate to call our office.  [place]: [address] [place], [place] [address] [phone]  [place]: [address] [place], [place] [address] [phone]  [place]: [address]  , [place] [address] [phone]  Dr. Richard   Tuchman DPM, Dr. Kylani Wires DPM Dr. Richard Sikora DPM, Dr. M. Todd Hyatt DPM, Dr. Kathryn Egerton DPM 

## 2014-09-27 NOTE — Progress Notes (Signed)
Subjective:     Patient ID: Kimberly [name], female   DOB: 1941/02/27, 74 y.o.   MRN: [id number]  HPI patient has quite a bit of discomfort fifth digit right which is been persistent stated she only had several weeks of relief when we did the previous procedure. Also traumatized her left foot and is worried she may broken her fifth toe   Review of Systems     Objective:   Physical Exam Neurovascular status intact muscle strength adequate with range of motion subtalar midtarsal joint within normal limits. Patient's right fifth toe has keratotic lesion had a proximal phalanx is still very sore and distal lateral keratotic lesion it's very tender when pressed. Left fifth toe is swollen secondary to trauma but does not appear to be out of position and she is noted to have good digital perfusion bilateral 1 through 5    Assessment:     Hammertoe deformity fifth digit right with distal medial exostosis it's painful and traumatized left fifth toe    Plan:     H&P and x-rays were reviewed with patient and today I recommended arthroplasty fifth digit rest right and exostectomy of the fifth toe. She wants surgery and at this time I reviewed with her procedures and allowed her to read consent form going over all possible alternative treatments and complications as listed. Patient is amenable to this wants procedure signs consent form is given all preoperative instructions. Scheduled for outpatient surgery and also reviewed left toe which is doing fine

## 2014-10-01 ENCOUNTER — Telehealth: Payer: Self-pay | Admitting: *Deleted

## 2014-10-01 NOTE — Telephone Encounter (Signed)
Pt states she needs to postpone her surgery, due the possibility of her husband needing surgery soon.  Pt asked if there would be any increase risk in the postponement.  I left a message informing pt the painfulness or even a corn may return, but unless symptoms of infection - redness, swelling, drainage and pain appeared, she should be able to postpone the surgery a month or two.

## 2014-10-09 ENCOUNTER — Telehealth: Payer: Self-pay | Admitting: Internal Medicine

## 2014-10-09 NOTE — Telephone Encounter (Signed)
Patient states she has a history of constipation. Yesterday, she had little balls of stool. When, she wiped she had bright, red blood and mucous on tissue. She is asking to see Dr. Olevia Perches. Scheduled on 10/18/14 at 2:30 PM.

## 2014-10-16 ENCOUNTER — Telehealth: Payer: Self-pay | Admitting: Internal Medicine

## 2014-10-16 NOTE — Telephone Encounter (Signed)
Patient is on Sulfamethoxazole 800 mg liquid for a sinus infection. She wants to know if Dr. Olevia Perches thinks it is ok for her to take. Please, advise.

## 2014-10-16 NOTE — Telephone Encounter (Signed)
Reviewed- last colon 2013- for constipation. She needs to take Miralax 9-17 gm qd till the stool becomes soft. It is OF to take an Sulfa for sinus infection, but she needs to drink lot of liquid with it. Last episode of pancreatitis was attributed to Azithromycin.

## 2014-10-17 NOTE — Telephone Encounter (Signed)
Patient notified of recommendations. 

## 2014-10-17 NOTE — Telephone Encounter (Signed)
Left a message for patient to call back. 

## 2014-10-18 ENCOUNTER — Ambulatory Visit: Payer: Medicare Other | Admitting: Internal Medicine

## 2014-11-21 ENCOUNTER — Other Ambulatory Visit: Payer: Self-pay | Admitting: Neurological Surgery

## 2014-11-21 DIAGNOSIS — M47812 Spondylosis without myelopathy or radiculopathy, cervical region: Secondary | ICD-10-CM

## 2014-11-26 ENCOUNTER — Encounter: Payer: Self-pay | Admitting: Internal Medicine

## 2014-11-26 ENCOUNTER — Ambulatory Visit (INDEPENDENT_AMBULATORY_CARE_PROVIDER_SITE_OTHER): Payer: Medicare Other | Admitting: Internal Medicine

## 2014-11-26 VITALS — BP 120/76 | HR 68 | Ht 66.0 in | Wt 184.4 lb

## 2014-11-26 DIAGNOSIS — K625 Hemorrhage of anus and rectum: Secondary | ICD-10-CM

## 2014-11-26 MED ORDER — POLYETHYLENE GLYCOL 3350 17 GM/SCOOP PO POWD: ORAL | Status: DC

## 2014-11-26 MED ORDER — LINACLOTIDE 145 MCG PO CAPS: 145.0000 ug | ORAL_CAPSULE | Freq: Every day | ORAL | Status: DC

## 2014-11-26 MED ORDER — HYDROCORTISONE ACETATE 25 MG RE SUPP: 25.0000 mg | Freq: Every day | RECTAL | Status: DC

## 2014-11-26 NOTE — Progress Notes (Signed)
Kimberly [name] Sep 28, 1940 [id number]  Note: This dictation was prepared with Dragon digital system. Any transcriptional errors that result from this procedure are unintentional.   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 Today she reports several episodes of low-volume rectal bleeding which occurred while constipated. She has been under stress all due to her husband's illness. She became constipated. She has MiraLAX as well as lesions O's but doesn't take it every day. Upper endoscopy and colonoscopy in September 2013 by Dr. Sharlett Iles were normal. She has a positive family history of lung cancer in maternal aunt. In 1998 Dr. Marylene Buerger excised anal polyp. 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 This time she denies any rectal pain. Rectal bleeding has stopped about 2 weeks ago    Past Medical History  Diagnosis Date  . Cataracts, bilateral   . Degenerative disc disease   . Bunion   . Ankle fracture     Stress fracture  . Lacunar stroke   . IBS (irritable bowel syndrome)   . Depression   . Anxiety   . GERD (gastroesophageal reflux disease)   . Diverticulosis of colon (without mention of hemorrhage) 2010    Colonoscopy  . Stricture and stenosis of esophagus 2005,2010    EGD   . Anal polyp 1998    Flex Sig   . Internal hemorrhoids without mention of complication 0175,1025    Colonoscopy   . External hemorrhoids 2000    Colonoscopy  . Family history of malignant neoplasm of gastrointestinal tract   . Hyperlipemia   . Fibromyalgia   . Hiatal hernia 2005,2010    EGD  . Hypothyroidism   . Allergy   . Anemia   . Asthma     border line has inhaler  . Stroke   . Irregular heart beat   . Pseudophakia, both eyes   . Angular blepharitis of left eye   . Retinal scar     left  . PCO (posterior capsular opacification)     left  . Dry eyes    bilateral  . Corneal scar     left eye  . PVD (posterior vitreous detachment) right  . Rotator cuff disorder     pain, left shoulder  . Migraine   . Anxiety   . IBS (irritable bowel syndrome)   . Pancreatitis   . Osteopenia 10/2013    T score -1.6 FRAX 10%/1.6%    Past Surgical History  Procedure Laterality Date  . Total abdominal hysterectomy w/ bilateral salpingoophorectomy  1991    TAH BSO  . Tonsillectomy    . Breast lumpectomy Left     Benign  . Replacement total knee Right 2011  . Eye surgery Left   . Total knee arthroplasty      both  . Cataract extraction Bilateral 2013  . Nasal septum surgery    . Mouth surgery  11/13/11    Cyst removed from gum-benign   . Colonoscopy    . Nasal septum surgery  1970's  . Esophageal dilation    . Bunionectomy  2014    Allergies  Allergen Reactions  . Azithromycin Other (See Comments)    Pancreatitis  . Erythromycin Hives  . Atorvastatin Calcium [Atorvastatin] Other (See Comments)    Muscle weakness   . Augmentin [Amoxicillin-Pot Clavulanate] Nausea And Vomiting  . Clindamycin/Lincomycin     Stomach upset   .  Codeine Nausea Only  . Ketoconazole Nausea And Vomiting  . Morphine Sulfate Other (See Comments)    Unknown   . Septra [Bactrim]     Upset stomach  . Simvastatin     Muscle pain    Family history and social history have been reviewed.  Review of Systems: Mild constipation. Low-volume rectal bleeding. Neck pain evaluated by neurosurgeon  The remainder of the 10 point ROS is negative except as outlined in the H&P  Physical Exam: General Appearance Well developed, in no distress Eyes  Non icteric  HEENT  Non traumatic, normocephalic  Mouth No lesion, tongue papillated, no cheilosis Neck Supple without adenopathy, thyroid not enlarged, no carotid bruits, no JVD Lungs Clear to auscultation bilaterally COR Normal S1, normal S2, regular rhythm, no murmur, quiet precordium Abdomen soft, nontender, normoactive  bowel sounds. No fullness Rectal and anoscopic exam reveals small external skin tags. Normal rectal sphincter tone. 2 small internal hemorrhoids. Hemorrhagic. Active bleeding. Stool is Hemoccult-negative Extremities  No pedal edema Skin No lesions Neurological Alert and oriented x 3 Psychological Normal mood and affect  Assessment and Plan:   Low volume rectal bleeding due to small first-grade internal hemorrhoids. We will start Anusol HC suppository daily at bedtime.  Mild constipation. She will increase MiraLAX 17 g to 2-3 times a week and increase liquids this 145 g also 2-3 times a week. She is up-to-date on colonoscopy Patient is going I have a knee replaced in September by Dr. Ortencia Kick 11/26/2014

## 2014-11-26 NOTE — Patient Instructions (Addendum)
We are sending in your prescriptions in to your pharmacy.  Dr Felipa Eth

## 2014-12-13 ENCOUNTER — Ambulatory Visit
Admission: RE | Admit: 2014-12-13 | Discharge: 2014-12-13 | Disposition: A | Discharge status: Home / Self Care | Payer: Medicare Other | Source: Ambulatory Visit | Attending: Neurological Surgery | Admitting: Neurological Surgery

## 2014-12-13 DIAGNOSIS — M47812 Spondylosis without myelopathy or radiculopathy, cervical region: Secondary | ICD-10-CM

## 2014-12-25 ENCOUNTER — Telehealth: Payer: Self-pay | Admitting: Internal Medicine

## 2014-12-25 NOTE — Telephone Encounter (Signed)
Patient notified that on 11/26/14 she was hemoccult negative.  She is advised that she does not need to send in the cards to her insurance company.  She will call back to schedule with Dr. Havery Moros or Nadygam for further GI problems.

## 2015-01-01 ENCOUNTER — Encounter: Payer: Self-pay | Admitting: Internal Medicine

## 2015-01-01 ENCOUNTER — Ambulatory Visit (INDEPENDENT_AMBULATORY_CARE_PROVIDER_SITE_OTHER): Payer: Medicare Other | Admitting: Internal Medicine

## 2015-01-01 VITALS — BP 114/72 | HR 68 | Ht 66.54 in | Wt 185.4 lb

## 2015-01-01 DIAGNOSIS — K219 Gastro-esophageal reflux disease without esophagitis: Secondary | ICD-10-CM | POA: Diagnosis not present

## 2015-01-01 DIAGNOSIS — K589 Irritable bowel syndrome without diarrhea: Secondary | ICD-10-CM | POA: Diagnosis not present

## 2015-01-01 NOTE — Progress Notes (Signed)
TANASIA BUDZINSKI 1940-11-09 [id number]  Note: This dictation was prepared with Dragon digital system. Any transcriptional errors that result from this procedure are unintentional.   History of Present Illness: This is a 74 year old white female with irritable bowel syndrome with predominant constipation. Being on regimen of MiraLAX 17 g twice a week and Linzess 145 g 3 times a week. 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 She is a former patient of Dr. Sharlett Iles. Upper endoscopy and colonoscopy in September 2013 was normal except for mild diverticulosis. She was hospitalized for  idiopathic pancreatitis in February 2015 possibly attributed to azithromycin.,biliary cause could not be ruled out She is going to have total knee replacement next month by Dr.Alusio. She currently denies any abdominal pain rectal bleeding , she has occasional choking spell attributed to anxiety or stress which is usually caused by her husband who has multiple medical problems and can be difficult. She takes  Prilosec 20 mg daily    Past Medical History  Diagnosis Date  . Cataracts, bilateral   . Degenerative disc disease   . Bunion   . Ankle fracture     Stress fracture  . Lacunar stroke   . IBS (irritable bowel syndrome)   . Depression   . Anxiety   . GERD (gastroesophageal reflux disease)   . Diverticulosis of colon (without mention of hemorrhage) 2010    Colonoscopy  . Stricture and stenosis of esophagus 2005,2010    EGD   . Anal polyp 1998    Flex Sig   . Internal hemorrhoids without mention of complication 3557,3220    Colonoscopy   . External hemorrhoids 2000    Colonoscopy  . Family history of malignant neoplasm of gastrointestinal tract   . Hyperlipemia   . Fibromyalgia   . Hiatal hernia 2005,2010    EGD  . Hypothyroidism   . Allergy   . Anemia   . Asthma     border line has inhaler  . Stroke   . Irregular heart beat   .  Pseudophakia, both eyes   . Angular blepharitis of left eye   . Retinal scar     left  . PCO (posterior capsular opacification)     left  . Dry eyes     bilateral  . Corneal scar     left eye  . PVD (posterior vitreous detachment) right  . Rotator cuff disorder     pain, left shoulder  . Migraine   . Anxiety   . IBS (irritable bowel syndrome)   . Pancreatitis   . Osteopenia 10/2013    T score -1.6 FRAX 10%/1.6%    Past Surgical History  Procedure Laterality Date  . Total abdominal hysterectomy w/ bilateral salpingoophorectomy  1991    TAH BSO  . Tonsillectomy    . Breast lumpectomy Left     Benign  . Replacement total knee Right 2011  . Eye surgery Left   . Total knee arthroplasty      both  . Cataract extraction Bilateral 2013  . Nasal septum surgery    . Mouth surgery  11/13/11    Cyst removed from gum-benign   . Colonoscopy    . Nasal septum surgery  1970's  . Esophageal dilation    . Bunionectomy  2014    Allergies  Allergen Reactions  . Azithromycin Other (See Comments)    Pancreatitis  . Erythromycin Hives  . Atorvastatin Calcium [  Atorvastatin] Other (See Comments)    Muscle weakness   . Augmentin [Amoxicillin-Pot Clavulanate] Nausea And Vomiting  . Clindamycin/Lincomycin     Stomach upset   . Codeine Nausea Only  . Ketoconazole Nausea And Vomiting  . Morphine Sulfate Other (See Comments)    Unknown   . Septra [Bactrim]     Upset stomach  . Simvastatin     Muscle pain    Family history and social history have been reviewed.  Review of Systems: Constipation. Denies weight loss weight gain. Denies dysphagia  The remainder of the 10 point ROS is negative except as outlined in the H&P  Physical Exam: General Appearance Well developed, in no distress Psychological Normal mood and affect  Assessment and Plan:   74 year old white female with functional GI complaints consistent with irritable bowel syndrome with constipation and esophageal  spasm due to stress. Discussed at length use of laxatiives. She will continue Linzess and MiraLAX. She will continue omeprazole for gastroesophageal reflux and Aprazolam  .25 mg when necessary esophageal spasm she will follow-up with Dr.Nandigam for GI care     Delfin Edis 01/01/2015

## 2015-01-01 NOTE — Patient Instructions (Signed)
Dr Felipa Eth

## 2015-01-08 ENCOUNTER — Ambulatory Visit (INDEPENDENT_AMBULATORY_CARE_PROVIDER_SITE_OTHER): Payer: Medicare Other | Admitting: Cardiology

## 2015-01-08 ENCOUNTER — Encounter: Payer: Self-pay | Admitting: Cardiology

## 2015-01-08 VITALS — BP 128/78 | HR 64 | Ht 66.5 in | Wt 183.0 lb

## 2015-01-08 DIAGNOSIS — M1712 Unilateral primary osteoarthritis, left knee: Secondary | ICD-10-CM | POA: Diagnosis not present

## 2015-01-08 DIAGNOSIS — Z01818 Encounter for other preprocedural examination: Secondary | ICD-10-CM | POA: Diagnosis not present

## 2015-01-08 NOTE — Progress Notes (Signed)
Cardiology Office Note   Date:  01/08/2015   ID:  Kimberly [name], DOB 1941/04/27, MRN [id number]  PCP:  Mathews Argyle, MD    Chief Complaint  Patient presents with  . New Evaluation    surgical clearance      History of Present Illness: Kimberly [name] is a 74 y.o. female who presents for surgical clearance for orthopedic surgery.  She is going to have a left TKA with patella resurfacing.  I saw her a while back for SOB and nuclear stress test was normal.  She says that last fall and early this spring she woke up a few times with a rapid heart beat that lasted a few seconds and then resolved.  She has not had any further episodes.  She denies any chest pain or pressure.  She denies any SOB or DOE unless her allergies are bothering her.  She is very busy during the day and is able to walk more than 4 blocks without any problems with chest discomfort of SOB.  She has had some LE edema intermittently by the end of the day and has chronic swelling in her knees.      Past Medical History  Diagnosis Date  . Cataracts, bilateral   . Degenerative disc disease   . Bunion   . Ankle fracture     Stress fracture  . Lacunar stroke   . IBS (irritable bowel syndrome)   . Depression   . Anxiety   . GERD (gastroesophageal reflux disease)   . Diverticulosis of colon (without mention of hemorrhage) 2010    Colonoscopy  . Stricture and stenosis of esophagus 2005,2010    EGD   . Anal polyp 1998    Flex Sig   . Internal hemorrhoids without mention of complication 9233,[id number]    Colonoscopy   . External hemorrhoids 2000    Colonoscopy  . Family history of malignant neoplasm of gastrointestinal tract   . Hyperlipemia   . Fibromyalgia   . Hiatal hernia 2005,2010    EGD  . Hypothyroidism   . Allergy   . Anemia   . Asthma     border line has inhaler  . Stroke   . Irregular heart beat   . Pseudophakia, both eyes   . Angular blepharitis of left eye   . Retinal  scar     left  . PCO (posterior capsular opacification)     left  . Dry eyes     bilateral  . Corneal scar     left eye  . PVD (posterior vitreous detachment) right  . Rotator cuff disorder     pain, left shoulder  . Migraine   . Anxiety   . IBS (irritable bowel syndrome)   . Pancreatitis   . Osteopenia 10/2013    T score -1.6 FRAX 10%/1.6%    Past Surgical History  Procedure Laterality Date  . Total abdominal hysterectomy w/ bilateral salpingoophorectomy  1991    TAH BSO  . Tonsillectomy    . Breast lumpectomy Left     Benign  . Replacement total knee Right 2011  . Eye surgery Left   . Total knee arthroplasty      both  . Cataract extraction Bilateral 2013  . Nasal septum surgery    . Mouth surgery  11/13/11    Cyst removed from gum-benign   . Colonoscopy    .  Nasal septum surgery  1970's  . Esophageal dilation    . Bunionectomy  2014     Current Outpatient Prescriptions  Medication Sig Dispense Refill  . Albuterol Sulfate (PROAIR HFA IN) Inhale 1-2 puffs into the lungs as needed (Wheezing).     Marland Kitchen aspirin 81 MG tablet Take 81 mg by mouth daily.    . Calcium Carbonate-Vitamin D (CALCIUM + D PO) Take 1 tablet by mouth daily.     . Cholecalciferol (VITAMIN D) 2000 UNITS CAPS Take 1 capsule by mouth daily.    Mariane Baumgarten Calcium (STOOL SOFTENER PO) Take 1 capsule by mouth as needed (constipation).     . fluticasone (FLONASE) 50 MCG/ACT nasal spray Place 1 spray into both nostrils 3 (three) times a week.     . hydrocortisone (ANUSOL-HC) 25 MG suppository Place 1 suppository (25 mg total) rectally at bedtime. 12 suppository 2  . levothyroxine (SYNTHROID, LEVOTHROID) 112 MCG tablet Take 112 mcg by mouth daily before breakfast.    . Linaclotide (LINZESS) 145 MCG CAPS capsule Take 1 capsule (145 mcg total) by mouth daily. 30 capsule 3  . omeprazole (PRILOSEC) 20 MG capsule TAKE 1 CAPSULE (20 MG TOTAL) BY MOUTH DAILY. 30 capsule 4  . polyethylene glycol powder  (GLYCOLAX/MIRALAX) powder Use 17 grams daily as needed. 500 g 0  . RESTASIS 0.05 % ophthalmic emulsion Place 1 drop into both eyes daily. Uses as directed    . rosuvastatin (CRESTOR) 5 MG tablet Take 5 mg by mouth as directed.     . vitamin B-12 (CYANOCOBALAMIN) 1000 MCG tablet Take 1,000 mcg by mouth daily.     Current Facility-Administered Medications  Medication Dose Route Frequency Provider Last Rate Last Dose  . nystatin cream (MYCOSTATIN)   Topical BID Huel Cote, NP        Allergies:   Azithromycin; Erythromycin; Atorvastatin calcium; Augmentin; Clindamycin/lincomycin; Codeine; Ketoconazole; Morphine sulfate; Septra; and Simvastatin    Social History:  The patient  reports that she has quit smoking. She has never used smokeless tobacco. She reports that she does not drink alcohol or use illicit drugs.   Family History:  The patient's family history includes Alcohol abuse in her father; Allergies in her sister; Alzheimer's disease in her mother; Arthritis in her brother; Colon cancer in her paternal aunt; Congestive Heart Failure in her father; Dementia in her mother; Diabetes in her sister; Heart attack in her maternal grandfather, maternal grandmother, and paternal grandfather; Heart disease in her maternal grandmother; Hypertension in her father; Irritable bowel syndrome in her mother; Other in her sister and sister; Stroke in her mother. There is no history of Esophageal cancer, Rectal cancer, or Stomach cancer.    ROS:  Please see the history of present illness.   Otherwise, review of systems are positive for back, muscle and joint pain.   All other systems are reviewed and negative.    PHYSICAL EXAM: VS:  Ht 5' 6.5" (1.689 m) , BMI There is no weight on file to calculate BMI. GEN: Well nourished, well developed, in no acute distress HEENT: normal Neck: no JVD, carotid bruits, or masses Cardiac: RRR; no murmurs, rubs, or gallops,no edema  Respiratory:  clear to auscultation  bilaterally, normal work of breathing GI: soft, nontender, nondistended, + BS MS: no deformity or atrophy Skin: warm and dry, no rash Neuro:  Strength and sensation are intact Psych: euthymic mood, full affect   EKG:  EKG is ordered today. The ekg ordered today demonstrates NSR  with nonspecific ST abnormality   Recent Labs: 06/16/2014: ALT 17; BUN 18; Creatinine, Ser 0.61; Hemoglobin 13.0; Platelets 217; Potassium 3.3*; Sodium 135    Lipid Panel    Component Value Date/Time   CHOL 124 07/23/2013 0632   TRIG 69 07/23/2013 0632   HDL 68 07/23/2013 0632   CHOLHDL 1.8 07/23/2013 0632   VLDL 14 07/23/2013 0632   LDLCALC 42 07/23/2013 0632      Wt Readings from Last 3 Encounters:  01/01/15 185 lb 6 oz (84.086 kg)  11/26/14 184 lb 6.4 oz (83.643 kg)  08/27/14 184 lb (83.462 kg)       ASSESSMENT AND PLAN:  1.  Knee pain - needing TKA 2.  Preoperative clearance - she has not had any chest pain or SOB.  She is very active and is able to walk at least 4 blocks with no problems.  She had a nuclear stress test a few years ago that was normal.  Her EKG today showed nonspecific ST abnormality that was present at time of nuclear stress test 10/2012.  I think she is low risk from cardiac standpoint for knee surgery and should proceed. 3.  GERD 4.  Dyslipidemia - followed by PCP   Current medicines are reviewed at length with the patient today.  The patient does not have concerns regarding medicines.  The following changes have been made:  no change  Labs/ tests ordered today: See above Assessment and Plan No orders of the defined types were placed in this encounter.     Disposition:   FU with me PRN   Signed, Sueanne Margarita, MD  01/08/2015 10:01 AM    [facility] [address] [place], [place]  [address] Phone: [phone]; Fax: [phone]

## 2015-01-08 NOTE — Patient Instructions (Addendum)
Medication Instructions:  Your physician recommends that you continue on your current medications as directed. Please refer to the Current Medication list given to you today.   Labwork: None  Testing/Procedures: None  Follow-Up: Your physician recommends that you schedule a follow-up appointment AS NEEDED with Dr. Radford Pax.  Any Other Special Instructions Will Be Listed Below (If Applicable).

## 2015-01-09 ENCOUNTER — Encounter: Payer: Medicare Other | Admitting: Gynecology

## 2015-01-15 ENCOUNTER — Telehealth: Payer: Self-pay | Admitting: Cardiology

## 2015-01-15 DIAGNOSIS — G4733 Obstructive sleep apnea (adult) (pediatric): Secondary | ICD-10-CM

## 2015-01-15 NOTE — Telephone Encounter (Signed)
New message     Pt wanting to make sure you received information from [place] regarding the sleep study Pt wants to know if Dr Radford Pax concurs with the information that was sent to her Pt also has a couple questions regarding upcoming surgery also Please call to discuss

## 2015-01-15 NOTE — Telephone Encounter (Signed)
Called patient left message to call back. Patient's information from [place] has been received and are in Dr. Theodosia Blender box. Dr. Radford Pax is not in the office this week. Will inform patient when she returns call.

## 2015-01-16 NOTE — Telephone Encounter (Signed)
Patient was sent to Dr. Maxwell Caul with Kimberly [name] at [facility] for her sleep study.  Please get an appointment with him to discuss questions regarding her sleep study as I did not order the study or read the study

## 2015-01-16 NOTE — Telephone Encounter (Signed)
Follow up     Pt returning [place] call Please call before 10:30am or after 11:30am

## 2015-01-16 NOTE — Telephone Encounter (Signed)
Notified patient that Dr. Radford Pax is not in the office this week and will return next week.  I advised her that someone from our office will call her when Dr. Radford Pax has reviewed records.  She wants to know why Dr. Radford Pax thinks Dr. Maxwell Caul did not do an eeg with her sleep study; states she also has several other questions for Dr. Radford Pax.  I advised her that I will route message to Dr. Radford Pax and her primary nurse, Sammuel Bailiff, RN for follow-up next week.  Patient verbalized understanding and agreement.

## 2015-01-17 NOTE — Telephone Encounter (Signed)
Pt states that she knows that she needs  talked and has talked to Dr. Elenore Rota  about the sleep study. But she just wants for Dr. Radford Pax to took at    the  grafts and her PO2 saturation of the sleep study. The  copy was faxed to Dr. Radford Pax from Dr. Elenore Rota office.

## 2015-01-21 ENCOUNTER — Encounter: Payer: Self-pay | Admitting: *Deleted

## 2015-01-21 ENCOUNTER — Ambulatory Visit: Payer: Self-pay | Admitting: Orthopedic Surgery

## 2015-01-21 NOTE — Telephone Encounter (Signed)
Reviewed Home sleep study done by Dr. Maxwell Caul.  The patient has moderate OSA with an AHI of 16/hr.  She had oxygen desaturations as low as 75% and time below 89% was 12 minutes.  Her Epworth sleepiness scale is 13.  Given her symptoms of excessive daytime sleepiness, moderate OSA and oxygen desaturations, recommend in lab CPAP titration.  Please set up ASAP as patient is wanting to have knee surgery.

## 2015-01-21 NOTE — Progress Notes (Signed)
Preoperative surgical orders have been place into the Epic hospital system for Kimberly [name] on 01/21/2015, 12:35 PM  by Mickel Crow for surgery on 02/10/2015.  Preop Total Knee orders including Experal, IV Tylenol, and IV Decadron as long as there are no contraindications to the above medications. Kimberly Muslim, PA-C

## 2015-01-21 NOTE — Telephone Encounter (Signed)
CPAP Titration Scheduled - Patient is aware.

## 2015-01-21 NOTE — Addendum Note (Signed)
Addended by: Andres Ege on: 01/21/2015 09:58 AM   Modules accepted: Orders

## 2015-01-30 ENCOUNTER — Telehealth: Payer: Self-pay | Admitting: Internal Medicine

## 2015-01-30 NOTE — Telephone Encounter (Signed)
Ok

## 2015-01-30 NOTE — Telephone Encounter (Signed)
Ok to schedule with Dr. Hilarie Fredrickson.

## 2015-01-30 NOTE — Telephone Encounter (Signed)
Dr. Pyrtle will you accept this pt? 

## 2015-02-05 ENCOUNTER — Ambulatory Visit (INDEPENDENT_AMBULATORY_CARE_PROVIDER_SITE_OTHER): Payer: Medicare Other | Admitting: Gynecology

## 2015-02-05 ENCOUNTER — Encounter: Payer: Self-pay | Admitting: Gynecology

## 2015-02-05 VITALS — BP 134/82 | Ht 66.0 in | Wt 183.0 lb

## 2015-02-05 DIAGNOSIS — N952 Postmenopausal atrophic vaginitis: Secondary | ICD-10-CM

## 2015-02-05 DIAGNOSIS — M8580 Other specified disorders of bone density and structure, unspecified site: Secondary | ICD-10-CM | POA: Diagnosis not present

## 2015-02-05 DIAGNOSIS — Z01419 Encounter for gynecological examination (general) (routine) without abnormal findings: Secondary | ICD-10-CM

## 2015-02-05 NOTE — Progress Notes (Signed)
Kimberly [name] [birth year] [id number]        74 y.o.  G0P0 for breast and pelvic exam. Several issues noted below.  Past medical history,surgical history, problem list, medications, allergies, family history and social history were all reviewed and documented as reviewed in the EPIC chart.  ROS:  Performed with pertinent positives and negatives included in the history, assessment and plan.   Additional significant findings :  none   Exam: Leanne Lovely Vitals:   02/05/15 1223  BP: 134/82  Height: 5\' 6"  (1.676 m)  Weight: 183 lb (83.008 kg)   General appearance:  Normal affect, orientation and appearance. Skin: Grossly normal HEENT: Without gross lesions.  No cervical or supraclavicular adenopathy. Thyroid normal.  Lungs:  Clear without wheezing, rales or rhonchi Cardiac: RR, without RMG Abdominal:  Soft, nontender, without masses, guarding, rebound, organomegaly or hernia Breasts:  Examined lying and sitting without masses, retractions, discharge or axillary adenopathy. Pelvic:  Ext/BUS/vagina with atrophic changes  Adnexa  Without masses or tenderness    Anus and perineum  Normal   Rectovaginal  Normal sphincter tone without palpated masses or tenderness.    Assessment/Plan:  74 y.o. G0P0 female for breast and pelvic exam.   1. Postmenopausal/atrophic genital changes. Status post TAH/BSO 1991. Is having some issues with vaginal dryness and irritation.  Discussed options to include vaginal estrogen which she is not interested in. Recommended OTC vaginal moisturizer trial such as Replens and she agrees to do so. Follow up if symptoms persist or recur. 2. Osteopenia. DEXA 10/2014 T score -1.6 FRAX 10%/1.6%. Increased calcium and vitamin D reviewed. Repeat at 2 year interval. 3. Mammography coming due in November and I reminded her to schedule this. S/P chemotherapy reviewed. 4. Colonoscopy 2013. Patient in the process of setting up an appointment to see her  gastroenterologist due to her irritable bowel. 5. Pap smear 2011. No Pap smear done today. No history of abnormal Pap smears. Status post hysterectomy for benign indications. We both agree to stop screening per current screening guidelines.   Anastasio Auerbach MD, 12:55 PM 02/05/2015

## 2015-02-05 NOTE — Addendum Note (Signed)
Addended by: Joaquin Music on: 02/05/2015 02:04 PM   Modules accepted: Orders

## 2015-02-05 NOTE — Patient Instructions (Signed)

## 2015-02-10 NOTE — Telephone Encounter (Signed)
Patient is aware she is able to schedule with Dr. Hilarie Fredrickson. She will c/b to schedule when she needs to be seen,.

## 2015-02-19 ENCOUNTER — Ambulatory Visit (HOSPITAL_BASED_OUTPATIENT_CLINIC_OR_DEPARTMENT_OTHER): Payer: Medicare Other | Attending: Cardiology

## 2015-02-19 VITALS — Ht 67.0 in | Wt 183.0 lb

## 2015-02-19 DIAGNOSIS — G4733 Obstructive sleep apnea (adult) (pediatric): Secondary | ICD-10-CM | POA: Insufficient documentation

## 2015-02-19 DIAGNOSIS — R0683 Snoring: Secondary | ICD-10-CM | POA: Diagnosis not present

## 2015-02-19 DIAGNOSIS — G4730 Sleep apnea, unspecified: Secondary | ICD-10-CM | POA: Diagnosis present

## 2015-02-24 ENCOUNTER — Telehealth: Payer: Self-pay | Admitting: Cardiology

## 2015-02-24 NOTE — Telephone Encounter (Signed)
Pt had successful PAP titration. Please setup appointment in 10 weeks. Please let [facility] know that order for PAP is in EPIC.   

## 2015-02-24 NOTE — Addendum Note (Signed)
Addended by: Sueanne Margarita on: 02/24/2015 09:28 PM   Modules accepted: Orders

## 2015-02-24 NOTE — Progress Notes (Signed)
     Patient Name: Genessis, Flanary MRN: [id number] Study Date: 02/19/2015 Gender: Female D.O.B: 1940-10-10 Age (years): 22 Referring Provider: Fransico Him MD, ABSM Interpreting Physician: Fransico Him MD, ABSM RPSGT: Joni Reining  Height (inches): 67 Weight (lbs): 183 BMI: 29 Neck Size: 13.00  CLINICAL INFORMATION The patient is referred for a CPAP titration to treat sleep apnea.   SLEEP STUDY TECHNIQUE As per the AASM Manual for the Scoring of Sleep and Associated Events v2.3 (April 2016) with a hypopnea requiring 4% desaturations.  The channels recorded and monitored were frontal, central and occipital EEG, electrooculogram (EOG), submentalis EMG (chin), nasal and oral airflow, thoracic and abdominal wall motion, anterior tibialis EMG, snore microphone, electrocardiogram, and pulse oximetry. Continuous positive airway pressure (CPAP) was initiated at the beginning of the study and titrated to treat sleep-disordered breathing.  MEDICATIONS Medications taken by the patient : Albuterol, ASA, Calcium, Linzess, Synthroid, Prilosec, Crestor Medications administered by patient during sleep study : No sleep medicine administered.  TECHNICIAN COMMENTS Comments added by technician: NONE  Comments added by scorer: N/A  RESPIRATORY PARAMETERS Optimal PAP Pressure (cm):12  AHI at Optimal Pressure (/hr)2.2 Overall Minimal O2 (%)72.00   Supine % at Optimal Pressure (%):57 Minimal O2 at Optimal Pressure (%):87.0    SLEEP ARCHITECTURE The study was initiated at 11:01:49 PM and ended at 5:04:52 AM.  Sleep onset time was 4.4 minutes and the sleep efficiency was reduced at 74.9%. The total sleep time was 272.0 minutes.  The patient spent 4.60% of the night in stage N1 sleep, 47.24% in stage N2 sleep, 28.31% in stage N3 and 19.85% in REM.Stage REM latency was 104.5 minutes  Wake after sleep onset was 86.7. Alpha intrusion was absent. Supine sleep was 74.45%.  CARDIAC DATA The 2  lead EKG demonstrated sinus rhythm. The mean heart rate was 60.77 beats per minute. Other EKG findings include: None.  LEG MOVEMENT DATA The total Periodic Limb Movements of Sleep (PLMS) were 53. The PLMS index was 11.69. A PLMS index of <15 is considered normal in adults.  IMPRESSIONS The optimal PAP pressure was 12 cm of water. Central sleep apnea was not noted during this titration (CAI = 0.0/h). Severe oxygen desaturations were observed during this titration (min O2 = 72.00%). The patient snored with Moderate snoring volume during this titration study. No cardiac abnormalities were observed during this study. Mild periodic limb movements were observed during this study. Arousals associated with PLMs were rare.  DIAGNOSIS Obstructive Sleep Apnea (327.23 [G47.33 ICD-10])  RECOMMENDATIONS Trial of CPAP therapy on 12 cm H2O with a Small size Fisher&Paykel Full Face Mask Simplus mask and heated humidification. Avoid alcohol, sedatives and other CNS depressants that may worsen sleep apnea and disrupt normal sleep architecture. Sleep hygiene should be reviewed to assess factors that may improve sleep quality. Weight management and regular exercise should be initiated or continued. Return to [place] for re-evaluation after 10 weeks of therapy and download in 4 weeks to assess compliance.    Sueanne Margarita Diplomate, American Board of Sleep Medicine  ELECTRONICALLY SIGNED ON:  02/24/2015, 9:21 PM [place] PH: [phone]   FX: [phone] [place]

## 2015-02-25 NOTE — Telephone Encounter (Signed)
Patient is aware of results. [place] notified of orders

## 2015-02-25 NOTE — Telephone Encounter (Signed)
Left message for patient to call back  

## 2015-02-28 ENCOUNTER — Ambulatory Visit (INDEPENDENT_AMBULATORY_CARE_PROVIDER_SITE_OTHER): Payer: Medicare Other | Admitting: Podiatry

## 2015-02-28 ENCOUNTER — Encounter: Payer: Self-pay | Admitting: Podiatry

## 2015-02-28 DIAGNOSIS — M2040 Other hammer toe(s) (acquired), unspecified foot: Secondary | ICD-10-CM | POA: Diagnosis not present

## 2015-02-28 DIAGNOSIS — L84 Corns and callosities: Secondary | ICD-10-CM

## 2015-02-28 DIAGNOSIS — Q828 Other specified congenital malformations of skin: Secondary | ICD-10-CM | POA: Diagnosis not present

## 2015-02-28 DIAGNOSIS — R52 Pain, unspecified: Secondary | ICD-10-CM

## 2015-02-28 DIAGNOSIS — M2011 Hallux valgus (acquired), right foot: Secondary | ICD-10-CM | POA: Diagnosis not present

## 2015-02-28 NOTE — Progress Notes (Signed)
Patient ID: AKYLA VAVREK, female   DOB: January 11, 1941, 74 y.o.   MRN: [id number]  Subjective: 74 year old presents to the office they for painful calluses bilateral foot. She states that she apparently get the calluses trimmed which relieve her symptoms. She code denies any redness or drainage from the callus sites. No other complaints at this time in no acute changes since last appointment otherwise.  Objective: AAO 3, NAD Neurovascular status intact and unchanged There is hyperkeratotic lesions present on the right medial second digit, right fifth dorsal lateral fifth toe and left submetatarsal 3. Upon debridement there is no underlying ulceration, drainage or other signs of infection. There is tenderness palpation around the callus sites prior to debridement. There is resolution of symptoms after debridement. On the right side there is HAV and second digit hammertoe contracture present. On the left side that hallux is overlapping the second digit and there is contracture of the digits. No other areas of tenderness to bilateral lower extremity's. There is no open lesions or pre-ulcer lesions otherwise. There is no pain with calf compression, swelling, warmth, erythema.  Assessment: 74 year old female with symptomatic hyperkeratotic lesions, HAV, hammertoes  Plan: -Treatment options discussed including all alternatives, risks, and complications -Hyperkeratotic lesions or pre-debrided 3 without complication/bleeding. -She was inquiring about surgical intervention for her bunion and hurts hammertoe right foot. I discussed with her surgical intervention in the future. She'll be undergoing knee surgery in the near future and left side. Recommended to hold off until after her knee surgery. As far as the left side goes I discussed with her possible revision however is not painful at this time so recommended to hold off on surgery. -Follow-up as needed. Call if questions, concerns, change in  symptoms.  Celesta Gentile, DPM

## 2015-03-03 ENCOUNTER — Telehealth: Payer: Self-pay | Admitting: Cardiology

## 2015-03-03 NOTE — Telephone Encounter (Signed)
New Message  Pt called states that she will need results from her sleep study and would like to discuss if there is another group than [facility] because she is not pleased with this group.

## 2015-03-04 ENCOUNTER — Other Ambulatory Visit: Payer: Self-pay

## 2015-03-04 DIAGNOSIS — Z1231 Encounter for screening mammogram for malignant neoplasm of breast: Secondary | ICD-10-CM

## 2015-03-04 NOTE — Telephone Encounter (Signed)
Left message for patient to call back  

## 2015-03-07 ENCOUNTER — Telehealth: Payer: Self-pay | Admitting: Cardiology

## 2015-03-07 NOTE — Telephone Encounter (Signed)
Patient is keeping her appointment with [facility] to get her machine next week. Once she gets her machine, we will schedule her 10 week follow-up.

## 2015-03-07 NOTE — Telephone Encounter (Signed)
Patient has some concerns about her apnea. We went through different questions that she had regarding her sleep study

## 2015-03-07 NOTE — Telephone Encounter (Signed)
See phone note below.  

## 2015-03-07 NOTE — Telephone Encounter (Signed)
New message       Calling to talk to [place].  Please call at your convenience

## 2015-03-13 ENCOUNTER — Telehealth: Payer: Self-pay | Admitting: *Deleted

## 2015-03-13 NOTE — Telephone Encounter (Signed)
Patient called with suggestions because she is having problems with [facility] getting her machine situated.  She is not successfully been able to fully start using machine because she is having such a hard time with allergies. She said that she is calling to see if she can get in with her allergy doctor to see if that will help things.  I told her that I thought that would be a good thing for her to do since she has had to be seen in the past because of severe allergies. She said that she would call me back if she had any other issues.

## 2015-03-25 ENCOUNTER — Ambulatory Visit: Payer: Medicare Other | Admitting: Allergy and Immunology

## 2015-03-27 ENCOUNTER — Telehealth: Payer: Self-pay | Admitting: Cardiology

## 2015-03-27 NOTE — Telephone Encounter (Signed)
NEW  MESSAGE:    error

## 2015-04-01 ENCOUNTER — Telehealth: Payer: Self-pay | Admitting: *Deleted

## 2015-04-01 DIAGNOSIS — G4733 Obstructive sleep apnea (adult) (pediatric): Secondary | ICD-10-CM

## 2015-04-01 NOTE — Telephone Encounter (Signed)
Please get a 2 week autotitration from 4 to 20cm H2O and also an overnight pulse oximetry on CPAP

## 2015-04-01 NOTE — Telephone Encounter (Signed)
Order Placed. [facility]/Patient notified

## 2015-04-01 NOTE — Addendum Note (Signed)
Addended by: Andres Ege on: 04/01/2015 02:13 PM   Modules accepted: Orders

## 2015-04-01 NOTE — Telephone Encounter (Signed)
Patient is stating that she is having mask issues and she is working with [facility] on a new mask that fits better. However, she stated that by the time her machine gets to pressure of 12 it feels so strong that she can barely stand it. She said that she is waking up in the morning and she feels so "dried out". She is also questioning her oxygen level wanting to know if we can somehow tell her how she is doing on her oxygen.

## 2015-04-03 DIAGNOSIS — M5440 Lumbago with sciatica, unspecified side: Secondary | ICD-10-CM | POA: Insufficient documentation

## 2015-04-07 ENCOUNTER — Encounter (HOSPITAL_BASED_OUTPATIENT_CLINIC_OR_DEPARTMENT_OTHER): Payer: Medicare Other

## 2015-04-09 ENCOUNTER — Encounter: Payer: Self-pay | Admitting: Cardiology

## 2015-04-11 ENCOUNTER — Ambulatory Visit
Admission: RE | Admit: 2015-04-11 | Discharge: 2015-04-11 | Disposition: A | Discharge status: Home / Self Care | Payer: Medicare Other | Source: Ambulatory Visit

## 2015-04-11 DIAGNOSIS — Z1231 Encounter for screening mammogram for malignant neoplasm of breast: Secondary | ICD-10-CM

## 2015-04-23 ENCOUNTER — Encounter: Payer: Self-pay | Admitting: Cardiology

## 2015-05-02 ENCOUNTER — Encounter: Payer: Self-pay | Admitting: Gastroenterology

## 2015-05-14 ENCOUNTER — Ambulatory Visit (INDEPENDENT_AMBULATORY_CARE_PROVIDER_SITE_OTHER): Payer: Medicare Other | Admitting: Cardiology

## 2015-05-14 ENCOUNTER — Encounter: Payer: Self-pay | Admitting: Cardiology

## 2015-05-14 VITALS — BP 132/88 | HR 70 | Ht 67.5 in | Wt 184.0 lb

## 2015-05-14 DIAGNOSIS — G4733 Obstructive sleep apnea (adult) (pediatric): Secondary | ICD-10-CM | POA: Insufficient documentation

## 2015-05-14 HISTORY — DX: Obstructive sleep apnea (adult) (pediatric): G47.33

## 2015-05-14 NOTE — Progress Notes (Signed)
Cardiology Office Note   Date:  05/14/2015   ID:  Kimberly [name], DOB 1940-10-21, MRN [id number]  PCP:  Kimberly Argyle, MD    Chief Complaint  Patient presents with  . Sleep Apnea      History of Present Illness: Kimberly [name] is a 74 y.o. female who presents for followup of OSA. She had a sleep study done by Dr. Maxwell Caul which showed moderate OSA with an AHI of 16/hr and oxygen saturations as low as 75% and <89% for 12 minutes.  She underwent CPAP titration to 12cm H2O.  She is doing well with her CPAP therapy. She tolerates her full face mask and feels the pressure is adequate.  Her d/l today showed an AHi of 5.4/hr on 12cm H2O with 97% compliance in using more than 4 hours nightly.  She has some problems with her allergies and is seeing an allergist and is using nasal spray.  Since going on the CPAP she has noticed that she feels more rested in the am with less daytime sleepiness.    Past Medical History  Diagnosis Date  . Cataracts, bilateral   . Degenerative disc disease   . Bunion   . Ankle fracture     Stress fracture  . 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 IBS (irritable bowel syndrome)   . Depression   . Anxiety   . GERD (gastroesophageal reflux disease)   . Diverticulosis of colon (without mention of hemorrhage) 2010    Colonoscopy  . Stricture and stenosis of esophagus 2005,2010    EGD   . Anal polyp 1998    Flex Sig   . Internal hemorrhoids without mention of complication 0000000    Colonoscopy   . External hemorrhoids 2000    Colonoscopy  . Family history of malignant neoplasm of gastrointestinal tract   . Hyperlipemia   . Fibromyalgia   . Hiatal hernia 2005,2010    EGD  . Hypothyroidism   . Allergy   . Anemia   . Asthma     border line has inhaler  . Stroke ([place])   . Irregular heart beat   . Pseudophakia, both eyes   . Angular blepharitis of left eye   . Retinal scar     left  . PCO (posterior capsular opacification)    left  . Dry eyes     bilateral  . Corneal scar     left eye  . PVD (posterior vitreous detachment) right  . Rotator cuff disorder     pain, left shoulder  . Migraine   . Anxiety   . IBS (irritable bowel syndrome)   . Pancreatitis   . Osteopenia 10/2013    T score -1.6 FRAX 10%/1.6%  . OSA (obstructive sleep apnea) 05/14/2015    Past Surgical History  Procedure Laterality Date  . Total abdominal hysterectomy w/ bilateral salpingoophorectomy  1991    TAH BSO  . Tonsillectomy    . Breast lumpectomy Left     Benign  . Replacement total knee Right 2011  . Eye surgery Left   . Total knee arthroplasty      both  . Cataract extraction Bilateral 2013  . Nasal septum surgery    . Mouth surgery  11/13/11    Cyst removed from gum-benign   . Colonoscopy    . Nasal septum surgery  1970's  . Esophageal dilation    .  Bunionectomy  2014     Current Outpatient Prescriptions  Medication Sig Dispense Refill  . Albuterol Sulfate (PROAIR HFA IN) Inhale 1-2 puffs into the lungs as needed (Wheezing).     Marland Kitchen aspirin 81 MG tablet Take 81 mg by mouth daily.    . Calcium Carbonate-Vitamin D (CALCIUM + D PO) Take 1 tablet by mouth daily.     . Cholecalciferol (VITAMIN D) 2000 UNITS CAPS Take 1 capsule by mouth daily.    Mariane Baumgarten Calcium (STOOL SOFTENER PO) Take 1 capsule by mouth as needed (constipation).     . fluticasone (FLONASE) 50 MCG/ACT nasal spray Place 1 spray into both nostrils 3 (three) times a week.     . hydrocortisone (ANUSOL-HC) 25 MG suppository Place 1 suppository (25 mg total) rectally at bedtime. (Patient taking differently: Place 25 mg rectally 2 (two) times daily as needed for hemorrhoids. ) 12 suppository 2  . levothyroxine (SYNTHROID, LEVOTHROID) 112 MCG tablet Take 112 mcg by mouth daily before breakfast.    . Linaclotide (LINZESS) 145 MCG CAPS capsule Take 1 capsule (145 mcg total) by mouth daily. (Patient taking differently: Take 145 mcg by mouth as needed. ) 30 capsule  3  . METRONIDAZOLE, TOPICAL, 0.75 % LOTN Use as directed  3  . montelukast (SINGULAIR) 10 MG tablet Take 1 tablet by mouth daily. Take 1 tab daily  5  . omeprazole (PRILOSEC) 20 MG capsule TAKE 1 CAPSULE (20 MG TOTAL) BY MOUTH DAILY. 30 capsule 4  . polyethylene glycol powder (GLYCOLAX/MIRALAX) powder Use 17 grams daily as needed. 500 g 0  . RESTASIS 0.05 % ophthalmic emulsion Place 1 drop into both eyes daily. Uses as directed    . rosuvastatin (CRESTOR) 5 MG tablet Take 5 mg by mouth as directed.     . vitamin B-12 (CYANOCOBALAMIN) 1000 MCG tablet Take 1,000 mcg by mouth daily.     Current Facility-Administered Medications  Medication Dose Route Frequency Provider Last Rate Last Dose  . nystatin cream (MYCOSTATIN)   Topical BID Huel Cote, NP        Allergies:   Azithromycin; Erythromycin; Atorvastatin calcium; Augmentin; Clindamycin/lincomycin; Codeine; Ketoconazole; Morphine sulfate; Septra; Simvastatin; and Sulfamethoxazole-trimethoprim    Social History:  The patient  reports that she has quit smoking. She has never used smokeless tobacco. She reports that she does not drink alcohol or use illicit drugs.   Family History:  The patient's family history includes Alcohol abuse in her father; Allergies in her sister; Alzheimer's disease in her mother; Arthritis in her brother; Colon cancer in her paternal aunt; Congestive Heart Failure in her father; Dementia in her mother; Diabetes in her sister; Heart attack in her maternal grandfather, maternal grandmother, and paternal grandfather; Heart disease in her maternal grandmother; Hypertension in her father; Irritable bowel syndrome in her mother; Other in her sister and sister; Stroke in her mother. There is no history of Esophageal cancer, Rectal cancer, or Stomach cancer.    ROS:  Please see the history of present illness.   Otherwise, review of systems are positive for none.   All other systems are reviewed and negative.    PHYSICAL  EXAM: VS:  BP 132/88 mmHg  Pulse 70  Ht 5' 7.5" (1.715 m)  Wt 184 lb (83.462 kg)  BMI 28.38 kg/m2 , BMI Body mass index is 28.38 kg/(m^2). GEN: Well nourished, well developed, in no acute distress HEENT: normal Neck: no JVD, carotid bruits, or masses Cardiac: RRR; no murmurs, rubs, or  gallops,no edema  Respiratory:  clear to auscultation bilaterally, normal work of breathing GI: soft, nontender, nondistended, + BS MS: no deformity or atrophy Skin: warm and dry, no rash Neuro:  Strength and sensation are intact Psych: euthymic mood, full affect   EKG:  EKG is not ordered today.    Recent Labs: 06/16/2014: ALT 17; BUN 18; Creatinine, Ser 0.61; Hemoglobin 13.0; Platelets 217; Potassium 3.3*; Sodium 135    Lipid Panel    Component Value Date/Time   CHOL 124 07/23/2013 0632   TRIG 69 07/23/2013 0632   HDL 68 07/23/2013 0632   CHOLHDL 1.8 07/23/2013 0632   VLDL 14 07/23/2013 0632   LDLCALC 42 07/23/2013 0632      Wt Readings from Last 3 Encounters:  05/14/15 184 lb (83.462 kg)  02/19/15 183 lb (83.008 kg)  02/05/15 183 lb (83.008 kg)        ASSESSMENT AND PLAN:  1.  OSA on CPAP and tolerating well.  Her d/l today showed good compliance and improved AHI.  She will continue on current CPAP settings.    Current medicines are reviewed at length with the patient today.  The patient does not have concerns regarding medicines.  The following changes have been made:  no change  Labs/ tests ordered today: See above Assessment and Plan No orders of the defined types were placed in this encounter.     Disposition:   FU with me in 1 year  Signed, Sueanne Margarita, MD  05/14/2015 3:40 PM    [facility] [place], [place],   [address] Phone: [phone]; Fax: [phone]

## 2015-05-14 NOTE — Patient Instructions (Signed)

## 2015-05-19 ENCOUNTER — Encounter: Payer: Self-pay | Admitting: Cardiology

## 2015-05-28 ENCOUNTER — Ambulatory Visit (INDEPENDENT_AMBULATORY_CARE_PROVIDER_SITE_OTHER): Payer: Medicare Other | Admitting: Internal Medicine

## 2015-05-28 ENCOUNTER — Encounter: Payer: Self-pay | Admitting: Internal Medicine

## 2015-05-28 ENCOUNTER — Ambulatory Visit: Payer: Self-pay | Admitting: Orthopedic Surgery

## 2015-05-28 VITALS — BP 124/78 | HR 68 | Ht 66.5 in | Wt 185.2 lb

## 2015-05-28 DIAGNOSIS — K219 Gastro-esophageal reflux disease without esophagitis: Secondary | ICD-10-CM | POA: Diagnosis not present

## 2015-05-28 DIAGNOSIS — K581 Irritable bowel syndrome with constipation: Secondary | ICD-10-CM | POA: Diagnosis not present

## 2015-05-28 DIAGNOSIS — K648 Other hemorrhoids: Secondary | ICD-10-CM

## 2015-05-28 NOTE — Progress Notes (Signed)
Subjective:    Patient ID: Kimberly [name], female    DOB: 05-28-1941, 74 y.o.   MRN: [id number]  HPI Kimberly [name] is a 74 year old female with past medical history of constipation predominant IBS, internal hemorrhoids, esophageal spasm, anxiety, GERD, who is here for follow-up. She was managed for years by Dr. Verl Blalock and then by Dr. Delfin Edis after his retirement. This is her first visit with me. She has been seen on advice from orthopedics given her upcoming planned for left total knee replacement and history of blood with stooling. She is here alone today.  She reports most recently she has been feeling well. She does have intermittent constipation and can have hard pellet like stool. She uses MiraLAX 17 g every other day to every 3 days depending on how her stools are doing. Very rarely she uses Linzess 145 g daily. This does work well when she uses it. She can have some early morning lower abdominal discomfort alleviated by bowel movement. She rarely sees blood in her stool last was about 3 months ago. This is associated with straining and hard bowel movement. Blood is bright red and visible on the toilet tissue. She denies pain with passing stool. She occasionally uses Anusol HC suppository. Good appetite, no nausea or vomiting. Denies dysphagia though occasionally large pills are hard for her to swallow. She has a history of GERD which does not bother her as she is on omeprazole 20 mg daily.  She is recent diagnosed with shingles at the site of allergy testing. This was biopsy-proven by Dr. [place] with dermatology. She was treated with bowel tracts which she completed. The rash has resolved.  Her last upper endoscopy and colonoscopy were on 02/07/2012 perform a Dr. Sharlett Iles. Upper endoscopy was normal. Colonoscopy was normal.   Review of Systems As per history of present illness, otherwise negative  Current Medications, Allergies, Past Medical History, Past Surgical History,  Family History and Social History were reviewed in [employer] record.     Objective:   Physical Exam BP 124/78 mmHg  Pulse 68  Ht 5' 6.5" (1.689 m)  Wt 185 lb 4 oz (84.029 kg)  BMI 29.46 kg/m2 Constitutional: Well-developed and well-nourished. No distress. HEENT: Normocephalic and atraumatic. Oropharynx is clear and moist. No oropharyngeal exudate. Conjunctivae are normal.  No scleral icterus. Neck: Neck supple. Trachea midline. Cardiovascular: Normal rate, regular rhythm and intact distal pulses. No M/R/G Pulmonary/chest: Effort normal and breath sounds normal. No wheezing, rales or rhonchi. Abdominal: Soft, nontender, nondistended. Bowel sounds active throughout. There are no masses palpable.  Rectal with anoscopy -- skin tags and very small external hemorrhoid, no masses, brown heme-negative stool, and anoscopy reveals grade 2 internal hemorrhoids greater in the left lateral and right anterior position, normal tone Extremities: no clubbing, cyanosis, or edema Lymphadenopathy: No cervical adenopathy noted. Neurological: Alert and oriented to person place and time. Skin: Skin is warm and dry.  Psychiatric: Normal mood and affect. Behavior is normal.  CBC    Component Value Date/Time   WBC 8.0 06/16/2014 2300   RBC 4.18 06/16/2014 2300   HGB 13.0 06/16/2014 2300   HCT 38.9 06/16/2014 2300   PLT 217 06/16/2014 2300   MCV 93.1 06/16/2014 2300   MCH 31.1 06/16/2014 2300   MCHC 33.4 06/16/2014 2300   RDW 12.7 06/16/2014 2300   LYMPHSABS 2.7 06/16/2014 2300   MONOABS 0.7 06/16/2014 2300   EOSABS 0.2 06/16/2014 2300   BASOSABS 0.0 06/16/2014 2300  CMP     Component Value Date/Time   NA 135 06/16/2014 2300   K 3.3* 06/16/2014 2300   CL 103 06/16/2014 2300   CO2 27 06/16/2014 2300   GLUCOSE 131* 06/16/2014 2300   BUN 18 06/16/2014 2300   CREATININE 0.61 06/16/2014 2300   CALCIUM 9.1 06/16/2014 2300   PROT 7.2 06/16/2014 2300   ALBUMIN 4.7  06/16/2014 2300   AST 24 06/16/2014 2300   ALT 17 06/16/2014 2300   ALKPHOS 48 06/16/2014 2300   BILITOT 0.3 06/16/2014 2300   GFRNONAA 88* 06/16/2014 2300   GFRAA >90 06/16/2014 2300      Assessment & Plan:  74 year old female with past medical history of constipation predominant IBS, internal hemorrhoids, esophageal spasm, anxiety, GERD, who is here for follow-up.  1. Constipation predominant IBS -- long-standing, up-to-date on screening colonoscopy. I have encouraged her to continue MiraLAX every other day to help avoid constipation. She can use Linzess when necessary if constipation becomes more severe. I have discussed with her in the post surgical period she should use MiraLAX 17 g every day as I expect she will have narcotic pain medications which would be expected to worsen her constipation. She also may need to resume Linzess on a daily basis if MiraLAX alone is not enough to avoid constipation while on narcotics. She understands this recommendation  2. Internal hemorrhoids with rare rectal bleeding -- hemorrhoids exacerbated by #1. We are treating constipation as discussed in #1. She is up-to-date with colorectal cancer screening. No barrier from a GI perspective to total knee replacement surgery and anticoagulation thereafter. Can use hydrocortisone suppositories twice daily for 3-5 days if needed for hemorrhoid flare. If hemorrhoids continue to be a problem going forward we have discussed hemorrhoidal banding. I think she would be a good candidate. She will contact me if she is interested in proceeding  3. GERD -- continue daily omeprazole 20 mg.  Follow-up in one year, sooner if necessary

## 2015-05-28 NOTE — Progress Notes (Signed)
Preoperative surgical orders have been place into the Epic hospital system for Kimberly [name] on 05/28/2015, 3:29 PM  by Mickel Crow for surgery on 06-23-15.  Preop Total Knee orders including Experal, IV Tylenol, and IV Decadron as long as there are no contraindications to the above medications. Arlee Muslim, PA-C

## 2015-05-28 NOTE — Patient Instructions (Addendum)
Take Miralax daily x 3 days before knee replacement. If constipation continues, you may need Linzess. We will decide this after your knee replacement.  You may call back to schedule hemorrhoidal banding if you would like.  Follow up with Dr Hilarie Fredrickson in 1 year.

## 2015-06-03 ENCOUNTER — Ambulatory Visit (INDEPENDENT_AMBULATORY_CARE_PROVIDER_SITE_OTHER): Payer: Medicare Other | Admitting: Women's Health

## 2015-06-03 ENCOUNTER — Encounter: Payer: Self-pay | Admitting: Women's Health

## 2015-06-03 VITALS — BP 124/80 | Ht 66.0 in | Wt 185.0 lb

## 2015-06-03 DIAGNOSIS — B029 Zoster without complications: Secondary | ICD-10-CM

## 2015-06-03 DIAGNOSIS — N952 Postmenopausal atrophic vaginitis: Secondary | ICD-10-CM

## 2015-06-03 DIAGNOSIS — R350 Frequency of micturition: Secondary | ICD-10-CM

## 2015-06-03 LAB — WET PREP FOR TRICH, YEAST, CLUE
Clue Cells Wet Prep HPF POC: NONE SEEN
Trich, Wet Prep: NONE SEEN
Yeast Wet Prep HPF POC: NONE SEEN

## 2015-06-03 LAB — URINALYSIS W MICROSCOPIC + REFLEX CULTURE
Bilirubin Urine: NEGATIVE
Casts: NONE SEEN [LPF]
Crystals: NONE SEEN [HPF]
Glucose, UA: NEGATIVE
Ketones, ur: NEGATIVE
Leukocytes, UA: NEGATIVE
Nitrite: NEGATIVE
Protein, ur: NEGATIVE
Specific Gravity, Urine: 1.01 (ref 1.001–1.035)
Yeast: NONE SEEN [HPF]
pH: 7 (ref 5.0–8.0)

## 2015-06-03 MED ORDER — HYLAFEM VA SUPP: 1.0000 "application " | VAGINAL | Status: DC

## 2015-06-03 MED ORDER — VALACYCLOVIR HCL 500 MG PO TABS: 500.0000 mg | ORAL_TABLET | Freq: Two times a day (BID) | ORAL | Status: DC

## 2015-06-03 NOTE — Progress Notes (Signed)
Patient ID: Kimberly [name], female   DOB: [birth year], 75 y.o.   MRN: [id number]   Presents with complaint of vaginal dryness that causes irritation , itching and burning sensation. Has used Replens.   Rare sexual activity.Mild urinary frequency without pain or pressure.History of TAH with BSO on no HRT. Scheduled for total knee on January 23.  Has had Zostavax, diagnosed with shingles per biopsy at dermatologist Valtrex was helping , lesion now becoming more irritated.   exam: Appears well. No CVAT. UA +1 blood , 0-5 WBCs, 3-10 RBCs, 0-5 squamous epithelials  External genitalia without erythema,  Atrophic, probable diverticuli at urethra , nontender, wet prep with a Q-tip negative.  Upper left back 3 cm erythematous area ulcerated.  Vaginal atrophy causing irritation  Plan:  Options reviewed,will try Hylafem at bedtime 3 nights,  prescription, proper use given and reviewed.  Continue using A and D ointment externally.  Urine culture pending. Instructed to call if continued problems. Refill of Valtrex 500 twice daily given. Instructed to inform orthopedist if lesion still present at time of surgery. Low up with dermatologist as needed.

## 2015-06-03 NOTE — Addendum Note (Signed)
Addended by: Burnett Kanaris on: 06/03/2015 12:31 PM   Modules accepted: Orders

## 2015-06-03 NOTE — Patient Instructions (Signed)

## 2015-06-05 ENCOUNTER — Ambulatory Visit: Payer: Medicare Other | Admitting: Cardiology

## 2015-06-05 LAB — URINE CULTURE: Colony Count: 25000

## 2015-06-09 ENCOUNTER — Ambulatory Visit: Payer: Medicare Other | Admitting: Cardiology

## 2015-06-17 ENCOUNTER — Encounter (HOSPITAL_COMMUNITY)
Admission: RE | Admit: 2015-06-17 | Discharge: 2015-06-17 | Disposition: A | Discharge status: Home / Self Care | Payer: Medicare Other | Source: Ambulatory Visit | Attending: Orthopedic Surgery | Admitting: Orthopedic Surgery

## 2015-06-17 ENCOUNTER — Encounter (HOSPITAL_COMMUNITY): Payer: Self-pay

## 2015-06-17 DIAGNOSIS — Z01812 Encounter for preprocedural laboratory examination: Secondary | ICD-10-CM | POA: Diagnosis not present

## 2015-06-17 DIAGNOSIS — Z0183 Encounter for blood typing: Secondary | ICD-10-CM | POA: Diagnosis not present

## 2015-06-17 DIAGNOSIS — M1712 Unilateral primary osteoarthritis, left knee: Secondary | ICD-10-CM | POA: Diagnosis not present

## 2015-06-17 HISTORY — DX: Dizziness and giddiness: R42

## 2015-06-17 HISTORY — DX: Pruritus, unspecified: L29.9

## 2015-06-17 HISTORY — DX: Pneumonia, unspecified organism: J18.9

## 2015-06-17 HISTORY — DX: Asymptomatic varicose veins of unspecified lower extremity: I83.90

## 2015-06-17 HISTORY — DX: Other specified postprocedural states: R11.2

## 2015-06-17 HISTORY — DX: Chills (without fever): R68.83

## 2015-06-17 HISTORY — DX: Other abnormalities of gait and mobility: R26.89

## 2015-06-17 HISTORY — DX: Rash and other nonspecific skin eruption: R21

## 2015-06-17 HISTORY — DX: Presence of spectacles and contact lenses: Z97.3

## 2015-06-17 HISTORY — DX: Personal history of other infectious and parasitic diseases: Z86.19

## 2015-06-17 HISTORY — DX: Asymptomatic menopausal state: Z78.0

## 2015-06-17 HISTORY — DX: Personal history of other diseases of the respiratory system: Z87.09

## 2015-06-17 HISTORY — DX: Ulcerative colitis, unspecified, without complications: K51.90

## 2015-06-17 HISTORY — DX: Other specified postprocedural states: Z98.890

## 2015-06-17 HISTORY — DX: Personal history of urinary (tract) infections: Z87.440

## 2015-06-17 LAB — COMPREHENSIVE METABOLIC PANEL
ALT: 19 U/L (ref 14–54)
AST: 26 U/L (ref 15–41)
Albumin: 4.4 g/dL (ref 3.5–5.0)
Alkaline Phosphatase: 52 U/L (ref 38–126)
Anion gap: 9 (ref 5–15)
BUN: 14 mg/dL (ref 6–20)
CO2: 28 mmol/L (ref 22–32)
Calcium: 10.3 mg/dL (ref 8.9–10.3)
Chloride: 106 mmol/L (ref 101–111)
Creatinine, Ser: 0.67 mg/dL (ref 0.44–1.00)
GFR calc Af Amer: >60 mL/min (ref >60)
GFR calc non Af Amer: >60 mL/min (ref >60)
Glucose, Bld: 93 mg/dL (ref 65–99)
Potassium: 4.8 mmol/L (ref 3.5–5.1)
Sodium: 143 mmol/L (ref 135–145)
Total Bilirubin: 0.4 mg/dL (ref 0.3–1.2)
Total Protein: 7.5 g/dL (ref 6.5–8.1)

## 2015-06-17 LAB — URINE MICROSCOPIC-ADD ON

## 2015-06-17 LAB — PROTIME-INR
INR: 1.08 (ref 0.00–1.49)
Prothrombin Time: 14.2 seconds (ref 11.6–15.2)

## 2015-06-17 LAB — URINALYSIS, ROUTINE W REFLEX MICROSCOPIC
Bilirubin Urine: NEGATIVE
Glucose, UA: NEGATIVE mg/dL
Ketones, ur: NEGATIVE mg/dL
Leukocytes, UA: NEGATIVE
Nitrite: NEGATIVE
Protein, ur: NEGATIVE mg/dL
Specific Gravity, Urine: 1.004 — ABNORMAL LOW (ref 1.005–1.030)
pH: 7.5 (ref 5.0–8.0)

## 2015-06-17 LAB — CBC
HCT: 40 % (ref 36.0–46.0)
Hemoglobin: 13.3 g/dL (ref 12.0–15.0)
MCH: 31.3 pg (ref 26.0–34.0)
MCHC: 33.3 g/dL (ref 30.0–36.0)
MCV: 94.1 fL (ref 78.0–100.0)
Platelets: 218 10*3/uL (ref 150–400)
RBC: 4.25 MIL/uL (ref 3.87–5.11)
RDW: 13.2 % (ref 11.5–15.5)
WBC: 5.7 10*3/uL (ref 4.0–10.5)

## 2015-06-17 LAB — ABO/RH: ABO/RH(D): A POS

## 2015-06-17 LAB — SURGICAL PCR SCREEN
MRSA, PCR: NEGATIVE
Staphylococcus aureus: POSITIVE — AB

## 2015-06-17 LAB — APTT: aPTT: 31 seconds (ref 24–37)

## 2015-06-17 NOTE — Progress Notes (Signed)
Surgical screening results in epic per PAT visit 06/17/2015 positive for STAPH. Results sent to Dr Wynelle Link. Prescription for Mupriocin Ointment called to [facility] spoke with Jenny Reichmann - pharmacist. Pt is aware.

## 2015-06-17 NOTE — Patient Instructions (Signed)
NEKO BRANDSTETTER  06/17/2015   Your procedure is scheduled on: Monday June 23, 2015  Report to [facility]  Entrance take [place]  elevators to 3rd floor to  [place] at 5:00 AM.  Call this number if you have problems the morning of surgery [phone]   Remember: ONLY 1 PERSON MAY GO WITH YOU TO SHORT STAY TO GET  READY MORNING OF [place].  Do not eat food or drink liquids :After Midnight.     Take these medicines the morning of surgery with A SIP OF WATER: Levothyroxine: Montelukast (Singulair); Omeprazole (Prilosec); May use Proair Inhaler if needed (bring with you day of surgery); May bring Epipen with you day of surgery; May use Restasis and Refresh eye drops - bring with you day of surgery;May use Nasal Spray - bring with you day of surgery; May use Flonase - bring with you day of surgery.                               You may not have any metal on your body including hair pins and              piercings  Do not wear jewelry, make-up, lotions, powders or perfumes, deodorant             Do not wear nail polish.  Do not shave  48 hours prior to surgery.               Do not bring valuables to the hospital. [place].  Contacts, dentures or bridgework may not be worn into surgery.  Leave suitcase in the car. After surgery it may be brought to your room.                Please read over the following fact sheets you were given:MRSA INFORMATION SHEET; INCENTIVE SPIROMETER; BLOOD TRANSFUSION INFORMATION SHEET _____________________________________________________________________             [facility] - Preparing for Surgery Before surgery, you can play an important role.  Because skin is not sterile, your skin needs to be as free of germs as possible.  You can reduce the number of germs on your skin by washing with CHG (chlorahexidine gluconate) soap before surgery.  CHG is an antiseptic cleaner  which kills germs and bonds with the skin to continue killing germs even after washing. Please DO NOT use if you have an allergy to CHG or antibacterial soaps.  If your skin becomes reddened/irritated stop using the CHG and inform your nurse when you arrive at Short Stay. Do not shave (including legs and underarms) for at least 48 hours prior to the first CHG shower.  You may shave your face/neck. Please follow these instructions carefully:  1.  Shower with CHG Soap the night before surgery and the  morning of Surgery.  2.  If you choose to wash your hair, wash your hair first as usual with your  normal  shampoo.  3.  After you shampoo, rinse your hair and body thoroughly to remove the  shampoo.  4.  Use CHG as you would any other liquid soap.  You can apply chg directly  to the skin and wash                       Gently with a scrungie or clean washcloth.  5.  Apply the CHG Soap to your body ONLY FROM THE NECK DOWN.   Do not use on face/ open                           Wound or open sores. Avoid contact with eyes, ears mouth and genitals (private parts).                       Wash face,  Genitals (private parts) with your normal soap.             6.  Wash thoroughly, paying special attention to the area where your surgery  will be performed.  7.  Thoroughly rinse your body with warm water from the neck down.  8.  DO NOT shower/wash with your normal soap after using and rinsing off  the CHG Soap.                9.  Pat yourself dry with a clean towel.            10.  Wear clean pajamas.            11.  Place clean sheets on your bed the night of your first shower and do not  sleep with pets. Day of Surgery : Do not apply any lotions/deodorants the morning of surgery.  Please wear clean clothes to the hospital/surgery center.  FAILURE TO FOLLOW THESE INSTRUCTIONS MAY RESULT IN THE CANCELLATION OF YOUR SURGERY PATIENT SIGNATURE_________________________________  NURSE  SIGNATURE__________________________________  ________________________________________________________________________   Adam Phenix  An incentive spirometer is a tool that can help keep your lungs clear and active. This tool measures how well you are filling your lungs with each breath. Taking long deep breaths may help reverse or decrease the chance of developing breathing (pulmonary) problems (especially infection) following:  A long period of time when you are unable to move or be active. BEFORE THE PROCEDURE   If the spirometer includes an indicator to show your best effort, your nurse or respiratory therapist will set it to a desired goal.  If possible, sit up straight or lean slightly forward. Try not to slouch.  Hold the incentive spirometer in an upright position. INSTRUCTIONS FOR USE   Sit on the edge of your bed if possible, or sit up as far as you can in bed or on a chair.  Hold the incentive spirometer in an upright position.  Breathe out normally.  Place the mouthpiece in your mouth and seal your lips tightly around it.  Breathe in slowly and as deeply as possible, raising the piston or the ball toward the top of the column.  Hold your breath for 3-5 seconds or for as long as possible. Allow the piston or ball to fall to the bottom of the column.  Remove the mouthpiece from your mouth and breathe out normally.  Rest for a few seconds and repeat Steps 1 through 7 at least 10 times every 1-2 hours when you are awake. Take your time and take a few normal breaths between deep breaths.  The spirometer may include an indicator to show  your best effort. Use the indicator as a goal to work toward during each repetition.  After each set of 10 deep breaths, practice coughing to be sure your lungs are clear. If you have an incision (the cut made at the time of surgery), support your incision when coughing by placing a pillow or rolled up towels firmly against it. Once  you are able to get out of bed, walk around indoors and cough well. You may stop using the incentive spirometer when instructed by your caregiver.  RISKS AND COMPLICATIONS  Take your time so you do not get dizzy or light-headed.  If you are in pain, you may need to take or ask for pain medication before doing incentive spirometry. It is harder to take a deep breath if you are having pain. AFTER USE  Rest and breathe slowly and easily.  It can be helpful to keep track of a log of your progress. Your caregiver can provide you with a simple table to help with this. If you are using the spirometer at home, follow these instructions: [place] IF:   You are having difficultly using the spirometer.  You have trouble using the spirometer as often as instructed.  Your pain medication is not giving enough relief while using the spirometer.  You develop fever of 100.5 F (38.1 C) or higher. SEEK IMMEDIATE MEDICAL CARE IF:   You cough up bloody sputum that had not been present before.  You develop fever of 102 F (38.9 C) or greater.  You develop worsening pain at or near the incision site. MAKE SURE YOU:   Understand these instructions.  Will watch your condition.  Will get help right away if you are not doing well or get worse. Document Released: 09/27/2006 Document Revised: 08/09/2011 Document Reviewed: 11/28/2006 ExitCare Patient Information 2014 ExitCare, [place].   ________________________________________________________________________  WHAT IS A BLOOD TRANSFUSION? Blood Transfusion Information  A transfusion is the replacement of blood or some of its parts. Blood is made up of multiple cells which provide different functions.  Red blood cells carry oxygen and are used for blood loss replacement.  White blood cells fight against infection.  Platelets control bleeding.  Plasma helps clot blood.  Other blood products are available for specialized needs, such as  hemophilia or other clotting disorders. BEFORE THE TRANSFUSION  Who gives blood for transfusions?   Healthy volunteers who are fully evaluated to make sure their blood is safe. This is blood bank blood. Transfusion therapy is the safest it has ever been in the practice of medicine. Before blood is taken from a donor, a complete history is taken to make sure that person has no history of diseases nor engages in risky social behavior (examples are intravenous drug use or sexual activity with multiple partners). The donor's travel history is screened to minimize risk of transmitting infections, such as malaria. The donated blood is tested for signs of infectious diseases, such as HIV and hepatitis. The blood is then tested to be sure it is compatible with you in order to minimize the chance of a transfusion reaction. If you or a relative donates blood, this is often done in anticipation of surgery and is not appropriate for emergency situations. It takes many days to process the donated blood. RISKS AND COMPLICATIONS Although transfusion therapy is very safe and saves many lives, the main dangers of transfusion include:   Getting an infectious disease.  Developing a transfusion reaction. This is an allergic reaction to  something in the blood you were given. Every precaution is taken to prevent this. The decision to have a blood transfusion has been considered carefully by your caregiver before blood is given. Blood is not given unless the benefits outweigh the risks. AFTER THE TRANSFUSION  Right after receiving a blood transfusion, you will usually feel much better and more energetic. This is especially true if your red blood cells have gotten low (anemic). The transfusion raises the level of the red blood cells which carry oxygen, and this usually causes an energy increase.  The nurse administering the transfusion will monitor you carefully for complications. HOME CARE INSTRUCTIONS  No special  instructions are needed after a transfusion. You may find your energy is better. Speak with your caregiver about any limitations on activity for underlying diseases you may have. SEEK MEDICAL CARE IF:   Your condition is not improving after your transfusion.  You develop redness or irritation at the intravenous (IV) site. SEEK IMMEDIATE MEDICAL CARE IF:  Any of the following symptoms occur over the next 12 hours:  Shaking chills.  You have a temperature by mouth above 102 F (38.9 C), not controlled by medicine.  Chest, back, or muscle pain.  People around you feel you are not acting correctly or are confused.  Shortness of breath or difficulty breathing.  Dizziness and fainting.  You get a rash or develop hives.  You have a decrease in urine output.  Your urine turns a dark color or changes to pink, red, or brown. Any of the following symptoms occur over the next 10 days:  You have a temperature by mouth above 102 F (38.9 C), not controlled by medicine.  Shortness of breath.  Weakness after normal activity.  The white part of the eye turns yellow (jaundice).  You have a decrease in the amount of urine or are urinating less often.  Your urine turns a dark color or changes to pink, red, or brown. Document Released: 05/14/2000 Document Revised: 08/09/2011 Document Reviewed: 01/01/2008 [facility] Patient Information 2014 [place], [place].  _______________________________________________________________________

## 2015-06-17 NOTE — Progress Notes (Addendum)
Clearance note per Dr Radford Pax 01/08/2015 on chart Clearance note per Dr Felipa Eth 11/25/2014 on chart with H&P EKG / epic 01/08/2015 Stress test epic 11/02/2012 Dr Tobias Alexander / anesthesia in to see pt secondary to recent diagnosis of shingles/reviewed H&P. Anesthesia to see pt again day of surgery.

## 2015-06-20 ENCOUNTER — Telehealth: Payer: Self-pay | Admitting: *Deleted

## 2015-06-20 NOTE — Telephone Encounter (Signed)
Pt called to follow up from [place] 06/03/15 regarding Valtrex and shingles, pt said she spoke to [place] dermatologist that the area on back is better, still red circle, but not painful she spoke with them today. Pt is scheduled for surgery on her knee on Monday 06/23/15 states the  Anesthesiologist told her maybe to stay on Rx Valtrex  until after surgery? She wanted to get your thoughts on this as well. I told pt if Dr. Neita Garnet thought you would be fine without valtrex then maybe she should relay to the  Anesthesiologist this information as well. Do you agree? Please advise

## 2015-06-20 NOTE — Telephone Encounter (Signed)
Pt informed

## 2015-06-20 NOTE — Telephone Encounter (Signed)
Yes it is probably okay to stay off the Valtrex, the anesthesiologist is thinking possibly the stress of the surgery may flare shingles, if this would occur you could also start back on the Valtrex then. Wish her luck with the surgery she's having a knee replacement and was planning to go home and do rehabilitation as an outpatient.

## 2015-06-22 ENCOUNTER — Ambulatory Visit: Payer: Self-pay | Admitting: Orthopedic Surgery

## 2015-06-22 NOTE — H&P (Signed)
Kimberly [name] DOB: 05/27/1941 Married / Language: English / Race: White Female Date of Admission:  06/27/2015 CC:  Left Knee Pain History of Present Illness The patient is a 75 year old female who comes in for a preoperative History and Physical. The patient is scheduled for a left total knee arthroplasty to be performed by Dr. Dione Plover. Aluisio, MD at [facility] on 06-23-2015. The patient is a 75 year old female who presented with knee complaints. The patient reports left knee symptoms including: pain (lateral) and swelling which began over 5 years ago without any known injury. Patient states that she has been having pain in her knee. She said that over the past five years it seems to be progressively turning inward. She has been going to see Dr.Wainer for her left knee. She has tried cortisone injections, therapy, and a knee scope in the past. The cortisone injections usually last a few months. The patient feels that the symptoms are worsening. Previous work-up for this problem has included knee x-rays. Symptoms are relieved by rest. She has noticed that both knees feel weak but feels that is a result of her knee and back pain. She had the right knee replaced by Dr. Noemi Chapel about three years ago. She states she did very well with that. She said that the left knee has gotten progressively worse. Especially over the past six months to one year, she had a marked increase in pain and dysfunction. She has had cortisone injections. She is at a stage now where the left knee is bothering her as bad as the right one did prior to when she had surgery on the right. The injections have provided a very little short-term relief. She is not having any hip pain associated with this. She is not having any right lower extremity pain. She is ready to proceed with surgery at this time. They have been treated conservatively in the past for the above stated problem and despite conservative measures, they continue to  have progressive pain and severe functional limitations and dysfunction. They have failed non-operative management including home exercise, medications, and injections. It is felt that they would benefit from undergoing total joint replacement. Risks and benefits of the procedure have been discussed with the patient and they elect to proceed with surgery. There are no active contraindications to surgery such as ongoing infection or rapidly progressive neurological disease.  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)  Cerebrovascular Accident  Lacunar Stroke Migraine Headache  Impaired Vision  uses glasses Vertigo  Shingles  Fall 2016 Cataract  Asthma  Bronchitis  Pneumonia  Childhood Illness Sleep Apnea  uses CPAP Allergic Urticaria  Anxiety Disorder  Varicose veins  Hiatal Hernia  Gastroesophageal Reflux Disease  Hemorrhoids  Irritable bowel syndrome  Ulcerative Colitis  Pancreatitis  Past History Urinary Tract Infection  Hypothyroidism  Fibromyalgia  Osteoporosis  Osteoarthritis  Degenerative Disc Disease  Measles  Mumps  Menopause  Allergies Atorvastatin Calcium *ANTIHYPERLIPIDEMICS*  muscle weakness ERYTHROMYCIN 11/19/2010 Hives. Ketoconazole *ANTIFUNGALS*  Morphine Sulfate *ANALGESICS - OPIOID*  Simvastatin *ANTIHYPERLIPIDEMICS*  muscle pain Codeine Sulfate *ANALGESICS - OPIOID*  nausea Augmentin *PENICILLINS*  Nausea. Clindamycin HCl *CHEMICALS*  Rash. Azithromycin *CHEMICALS*  pancreatitis Shellfish  No prior issues with Betadine or Iodine  Family History Cerebrovascular Accident  Mother. mother Chronic Obstructive Lung Disease  Sister. Congestive  Heart Failure  Father. father Depression  Sister. mother and  father Drug / Alcohol Addiction  father Diabetes Mellitus  Sister. First Degree Relatives  reported Heart Disease  Father, Sister. father, grandfather mothers side and grandfather fathers side Heart disease in female family member before age 27  Hypertension  Father, Sister. father Kidney disease  Sister. Osteoarthritis  Brother, Father, Maternal Grandfather, Mother, Sister. mother, father, sister, brother, grandmother fathers side and grandfather fathers side Osteoporosis  Maternal Grandfather, Sister. Severe allergy  Sister. 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 10/24/2014: smoke(d) 1 pack(s) per day, former smoker Not under pain contract  Number of flights of stairs before winded  2-3 Former drinker  10/24/2014: In the past drank wine only occasionally per week No alcohol use  Drug/Alcohol Rehab (Previously)  no Illicit drug use  no Pain Contract  no Current work status  working part time Exercise  Exercises daily; does other, Exercises weekly; does running / walking Drug/Alcohol Rehab (Currently)  no Children  1  Medication History ProAir HFA (108 (90 Base)MCG/ACT Aerosol Soln, Inhalation as needed) Active. Align (Oral) Active. Crestor (10MG  Tablet, Oral) Active. Singulair (Oral) Specific strength unknown - Active. Fluticasone Propionate (Nasal) Specific strength unknown - Active. EpiPen (1:1000 Device, Injection) Active. Levothyroxine Sodium (125MCG Tablet, Oral) Active. Caltrate 600 (Oral) Specific strength unknown - Active. MiraLax (Oral) Active. Aspirin EC (81MG  Tablet DR, Oral) Active. Omeprazole (20MG  Capsule DR, Oral) Active. Vitamin C (500MG  Tablet, 1 (one) Oral) Active. Vitamin  B-12 (1000MCG Tablet, 1 (one) Oral) Active. Valtrex (Oral) Specific strength unknown - Active. (For recent Shingles Bout)  Past Surgical History Anal Fissure Repair  Arthroscopy of Knee  bilateral Breast Biopsy  left Breast Mass; Local Excision  left Cataract Surgery  bilateral Colon Polyp Removal - Colonoscopy  Foot Surgery  left Sinus Surgery  Hemorrhoidectomy  Hysterectomy  complete (non-cancerous) complete (cancerous) Total Hip Replacement  right Straighten Nasal Septum  Tonsillectomy  Total Knee Replacement  right   Review of Systems General Present- Chills. Not Present- Fatigue, Fever, Memory Loss, Night Sweats, Weight Gain and Weight Loss. Skin Present- Itching and Rash. Not Present- Eczema, Hives and Lesions. HEENT Not Present- Dentures, Double Vision, Headache, Hearing Loss, Tinnitus and Visual Loss. Respiratory Not Present- Allergies, Chronic Cough, Coughing up blood, Shortness of breath at rest and Shortness of breath with exertion. Cardiovascular Not Present- Chest Pain, Difficulty Breathing Lying Down, Murmur, Palpitations, Racing/skipping heartbeats and Swelling. Gastrointestinal Present- Bloody Stool (Past history but not in the past year), Constipation, Heartburn (intermittent), Nausea and Vomiting. Not Present- Abdominal Pain, Diarrhea, Difficulty Swallowing, Jaundice and Loss of appetitie. Female Genitourinary Not Present- Blood in Urine, Discharge, Flank Pain, Incontinence, Painful Urination, Urgency, Urinary frequency, Urinary Retention, Urinating at Night and Weak urinary stream. Musculoskeletal Present- Back Pain, Joint Pain, Morning Stiffness, Muscle Pain and Spasms. Not Present- Joint Swelling and Muscle Weakness. Neurological Not Present- Blackout spells, Difficulty with balance, Dizziness, Paralysis, Tremor and Weakness. Psychiatric Not Present- Insomnia.  Vitals Weight: 181 lb Height: 67in Weight was reported by patient. Height was  reported by patient. Body Surface Area: 1.94 m Body Mass Index: 28.35 kg/m  BP: 140/82 (Sitting, Right Arm, Standard)   Physical Exam General Mental Status -Alert, cooperative and good historian. General Appearance-pleasant, Not  in acute distress. Orientation-Oriented X3. Build & Nutrition-Well nourished and Well developed.  Head and Neck Head-normocephalic, atraumatic . Neck Global Assessment - supple, no bruit auscultated on the right, no bruit auscultated on the left.  Eye Vision-Wears corrective lenses. 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  Pupil - Bilateral-Regular and Round. Motion - Bilateral-EOMI.  Chest and Lung Exam Auscultation Breath sounds - clear at anterior chest wall and clear at posterior chest wall. Adventitious sounds - No Adventitious sounds.  Cardiovascular Auscultation Rhythm - Regular rate and rhythm. Heart Sounds - S1 WNL and S2 WNL. Murmurs & Other Heart Sounds - Auscultation of the heart reveals - No Murmurs.  Abdomen Palpation/Percussion Tenderness - Abdomen is non-tender to palpation. Rigidity (guarding) - Abdomen is soft. Auscultation Auscultation of the abdomen reveals - Bowel sounds normal.  Female Genitourinary Note: Not done, not pertinent to present illness  Assessment & Plan Primary osteoarthritis of left knee (M17.12)  Note:Surgical Plans: Left Total Knee Replacement  Disposition: Home with husband  PCP: Dr. Felipa Eth - Patient has been seen preoperatively and felt to be stable for surgery. Cards: Dr. Radford Pax - Patient has been seen preoperatively and felt to be stable for surgery.  Topical TXA - History of CVA  Anesthesia Issues: None except for some nausea in the past  Signed electronically by Joelene Millin, III PA-C

## 2015-06-23 ENCOUNTER — Inpatient Hospital Stay (HOSPITAL_COMMUNITY)
Admission: RE | Admit: 2015-06-23 | Discharge: 2015-06-25 | DRG: 470 | Disposition: A | Discharge status: Home Health | Payer: Medicare Other | Source: Ambulatory Visit | Attending: Orthopedic Surgery | Admitting: Orthopedic Surgery

## 2015-06-23 ENCOUNTER — Encounter (HOSPITAL_COMMUNITY): Payer: Self-pay | Admitting: *Deleted

## 2015-06-23 ENCOUNTER — Inpatient Hospital Stay (HOSPITAL_COMMUNITY): Payer: Medicare Other | Admitting: Anesthesiology

## 2015-06-23 ENCOUNTER — Encounter (HOSPITAL_COMMUNITY)
Admission: RE | Disposition: A | Discharge status: Home Health | Payer: Self-pay | Source: Ambulatory Visit | Attending: Orthopedic Surgery

## 2015-06-23 DIAGNOSIS — M179 Osteoarthritis of knee, unspecified: Secondary | ICD-10-CM | POA: Diagnosis present

## 2015-06-23 DIAGNOSIS — Z01812 Encounter for preprocedural laboratory examination: Secondary | ICD-10-CM

## 2015-06-23 DIAGNOSIS — Z7982 Long term (current) use of aspirin: Secondary | ICD-10-CM

## 2015-06-23 DIAGNOSIS — Z96641 Presence of right artificial hip joint: Secondary | ICD-10-CM | POA: Diagnosis present

## 2015-06-23 DIAGNOSIS — K449 Diaphragmatic hernia without obstruction or gangrene: Secondary | ICD-10-CM | POA: Diagnosis present

## 2015-06-23 DIAGNOSIS — E039 Hypothyroidism, unspecified: Secondary | ICD-10-CM | POA: Diagnosis present

## 2015-06-23 DIAGNOSIS — R112 Nausea with vomiting, unspecified: Secondary | ICD-10-CM | POA: Diagnosis not present

## 2015-06-23 DIAGNOSIS — M25562 Pain in left knee: Secondary | ICD-10-CM | POA: Diagnosis present

## 2015-06-23 DIAGNOSIS — M810 Age-related osteoporosis without current pathological fracture: Secondary | ICD-10-CM | POA: Diagnosis present

## 2015-06-23 DIAGNOSIS — M1710 Unilateral primary osteoarthritis, unspecified knee: Secondary | ICD-10-CM | POA: Diagnosis present

## 2015-06-23 DIAGNOSIS — M1712 Unilateral primary osteoarthritis, left knee: Secondary | ICD-10-CM

## 2015-06-23 DIAGNOSIS — Z8673 Personal history of transient ischemic attack (TIA), and cerebral infarction without residual deficits: Secondary | ICD-10-CM | POA: Diagnosis not present

## 2015-06-23 DIAGNOSIS — I739 Peripheral vascular disease, unspecified: Secondary | ICD-10-CM | POA: Diagnosis present

## 2015-06-23 DIAGNOSIS — K219 Gastro-esophageal reflux disease without esophagitis: Secondary | ICD-10-CM | POA: Diagnosis not present

## 2015-06-23 DIAGNOSIS — Z87891 Personal history of nicotine dependence: Secondary | ICD-10-CM | POA: Diagnosis not present

## 2015-06-23 DIAGNOSIS — G4730 Sleep apnea, unspecified: Secondary | ICD-10-CM | POA: Diagnosis present

## 2015-06-23 DIAGNOSIS — Z79899 Other long term (current) drug therapy: Secondary | ICD-10-CM | POA: Diagnosis not present

## 2015-06-23 HISTORY — PX: TOTAL KNEE ARTHROPLASTY: SHX125

## 2015-06-23 LAB — TYPE AND SCREEN
ABO/RH(D): A POS
Antibody Screen: NEGATIVE

## 2015-06-23 SURGERY — ARTHROPLASTY, KNEE, TOTAL
Anesthesia: Spinal | Laterality: Left

## 2015-06-23 MED ORDER — MONTELUKAST SODIUM 10 MG PO TABS
10.0000 mg | ORAL_TABLET | Freq: Every day | ORAL | Status: DC
  Administered 2015-06-23: 10 mg via ORAL
  Filled 2015-06-23 (×3): qty 1

## 2015-06-23 MED ORDER — ACETAMINOPHEN 650 MG RE SUPP: 650.0000 mg | Freq: Four times a day (QID) | RECTAL | Status: DC | PRN

## 2015-06-23 MED ORDER — SODIUM CHLORIDE 0.9 % IJ SOLN
INTRAMUSCULAR | Status: AC
  Filled 2015-06-23: qty 50

## 2015-06-23 MED ORDER — LIDOCAINE HCL (CARDIAC) 20 MG/ML IV SOLN
INTRAVENOUS | Status: DC | PRN
  Administered 2015-06-23: 50 mg via INTRATRACHEAL

## 2015-06-23 MED ORDER — DOCUSATE SODIUM 100 MG PO CAPS
100.0000 mg | ORAL_CAPSULE | Freq: Two times a day (BID) | ORAL | Status: DC
  Administered 2015-06-23 – 2015-06-25 (×2): 100 mg via ORAL

## 2015-06-23 MED ORDER — PANTOPRAZOLE SODIUM 40 MG PO TBEC
40.0000 mg | DELAYED_RELEASE_TABLET | Freq: Every day | ORAL | Status: DC
  Administered 2015-06-23: 40 mg via ORAL
  Filled 2015-06-23 (×2): qty 1

## 2015-06-23 MED ORDER — MIDAZOLAM HCL 2 MG/2ML IJ SOLN
INTRAMUSCULAR | Status: AC
  Filled 2015-06-23: qty 2

## 2015-06-23 MED ORDER — SODIUM CHLORIDE 0.9 % IV SOLN
2000.0000 mg | INTRAVENOUS | Status: DC | PRN
Start: 2015-06-23 — End: 2015-06-23
  Administered 2015-06-23: 2000 mg via TOPICAL

## 2015-06-23 MED ORDER — LIDOCAINE HCL (CARDIAC) 20 MG/ML IV SOLN
INTRAVENOUS | Status: AC
  Filled 2015-06-23: qty 5

## 2015-06-23 MED ORDER — ALBUTEROL SULFATE (2.5 MG/3ML) 0.083% IN NEBU: 3.0000 mL | INHALATION_SOLUTION | Freq: Four times a day (QID) | RESPIRATORY_TRACT | Status: DC | PRN

## 2015-06-23 MED ORDER — CEFAZOLIN SODIUM-DEXTROSE 2-3 GM-% IV SOLR
2.0000 g | Freq: Four times a day (QID) | INTRAVENOUS | Status: AC
  Administered 2015-06-23 (×2): 2 g via INTRAVENOUS
  Filled 2015-06-23 (×2): qty 50

## 2015-06-23 MED ORDER — METOCLOPRAMIDE HCL 10 MG PO TABS: 5.0000 mg | ORAL_TABLET | Freq: Three times a day (TID) | ORAL | Status: DC | PRN

## 2015-06-23 MED ORDER — ROSUVASTATIN CALCIUM 5 MG PO TABS
5.0000 mg | ORAL_TABLET | Freq: Every day | ORAL | Status: DC
  Administered 2015-06-23 – 2015-06-24 (×2): 5 mg via ORAL
  Filled 2015-06-23 (×3): qty 1

## 2015-06-23 MED ORDER — TRANEXAMIC ACID 1000 MG/10ML IV SOLN
2000.0000 mg | Freq: Once | INTRAVENOUS | Status: DC
  Filled 2015-06-23: qty 20

## 2015-06-23 MED ORDER — LACTATED RINGERS IV SOLN
INTRAVENOUS | Status: DC | PRN
  Administered 2015-06-23: 07:00:00 via INTRAVENOUS

## 2015-06-23 MED ORDER — VALACYCLOVIR HCL 500 MG PO TABS
500.0000 mg | ORAL_TABLET | Freq: Two times a day (BID) | ORAL | Status: DC
  Filled 2015-06-23 (×3): qty 1

## 2015-06-23 MED ORDER — CHLORHEXIDINE GLUCONATE 4 % EX LIQD: 60.0000 mL | Freq: Once | CUTANEOUS | Status: DC

## 2015-06-23 MED ORDER — ONDANSETRON HCL 4 MG/2ML IJ SOLN
4.0000 mg | Freq: Four times a day (QID) | INTRAMUSCULAR | Status: DC | PRN
  Administered 2015-06-23 – 2015-06-25 (×3): 4 mg via INTRAVENOUS
  Filled 2015-06-23 (×3): qty 2

## 2015-06-23 MED ORDER — FLUTICASONE PROPIONATE 50 MCG/ACT NA SUSP
2.0000 | NASAL | Status: DC
  Administered 2015-06-24: 2 via NASAL
  Filled 2015-06-23: qty 16

## 2015-06-23 MED ORDER — CEFAZOLIN SODIUM-DEXTROSE 2-3 GM-% IV SOLR
2.0000 g | INTRAVENOUS | Status: AC
  Administered 2015-06-23: 2 g via INTRAVENOUS

## 2015-06-23 MED ORDER — BISACODYL 10 MG RE SUPP: 10.0000 mg | Freq: Every day | RECTAL | Status: DC | PRN

## 2015-06-23 MED ORDER — MENTHOL 3 MG MT LOZG: 1.0000 | LOZENGE | OROMUCOSAL | Status: DC | PRN

## 2015-06-23 MED ORDER — PROPOFOL 10 MG/ML IV BOLUS
INTRAVENOUS | Status: AC
  Filled 2015-06-23: qty 60

## 2015-06-23 MED ORDER — ONDANSETRON HCL 4 MG PO TABS
4.0000 mg | ORAL_TABLET | Freq: Four times a day (QID) | ORAL | Status: DC | PRN
  Administered 2015-06-25: 4 mg via ORAL
  Filled 2015-06-23: qty 1

## 2015-06-23 MED ORDER — ACETAMINOPHEN 10 MG/ML IV SOLN
1000.0000 mg | Freq: Once | INTRAVENOUS | Status: AC
  Administered 2015-06-23: 1000 mg via INTRAVENOUS

## 2015-06-23 MED ORDER — TRAMADOL HCL 50 MG PO TABS: 50.0000 mg | ORAL_TABLET | Freq: Four times a day (QID) | ORAL | Status: DC | PRN

## 2015-06-23 MED ORDER — CHLORHEXIDINE GLUCONATE 0.12 % MT SOLN
15.0000 mL | Freq: Two times a day (BID) | OROMUCOSAL | Status: DC
  Administered 2015-06-23 – 2015-06-25 (×3): 15 mL via OROMUCOSAL
  Filled 2015-06-23 (×5): qty 15

## 2015-06-23 MED ORDER — 0.9 % SODIUM CHLORIDE (POUR BTL) OPTIME
TOPICAL | Status: DC | PRN
  Administered 2015-06-23: 1000 mL

## 2015-06-23 MED ORDER — RIVAROXABAN 10 MG PO TABS
10.0000 mg | ORAL_TABLET | Freq: Every day | ORAL | Status: DC
  Administered 2015-06-24 – 2015-06-25 (×2): 10 mg via ORAL
  Filled 2015-06-23 (×3): qty 1

## 2015-06-23 MED ORDER — BUPIVACAINE HCL (PF) 0.25 % IJ SOLN
INTRAMUSCULAR | Status: AC
  Filled 2015-06-23: qty 30

## 2015-06-23 MED ORDER — FLEET ENEMA 7-19 GM/118ML RE ENEM
1.0000 | ENEMA | Freq: Once | RECTAL | Status: DC | PRN
Start: 2015-06-23 — End: 2015-06-25

## 2015-06-23 MED ORDER — SODIUM CHLORIDE 0.9 % IR SOLN
Status: DC | PRN
  Administered 2015-06-23: 1000 mL

## 2015-06-23 MED ORDER — METOCLOPRAMIDE HCL 5 MG/ML IJ SOLN
5.0000 mg | Freq: Three times a day (TID) | INTRAMUSCULAR | Status: DC | PRN
  Administered 2015-06-24: 10 mg via INTRAVENOUS
  Filled 2015-06-23: qty 2

## 2015-06-23 MED ORDER — CEFAZOLIN SODIUM-DEXTROSE 2-3 GM-% IV SOLR
INTRAVENOUS | Status: AC
  Filled 2015-06-23: qty 50

## 2015-06-23 MED ORDER — ACETAMINOPHEN 325 MG PO TABS: 650.0000 mg | ORAL_TABLET | Freq: Four times a day (QID) | ORAL | Status: DC | PRN

## 2015-06-23 MED ORDER — DIPHENHYDRAMINE HCL 12.5 MG/5ML PO ELIX: 12.5000 mg | ORAL_SOLUTION | ORAL | Status: DC | PRN

## 2015-06-23 MED ORDER — SODIUM CHLORIDE 0.9 % IJ SOLN
INTRAMUSCULAR | Status: DC | PRN
  Administered 2015-06-23: 30 mL

## 2015-06-23 MED ORDER — SODIUM CHLORIDE 0.9 % IV SOLN: INTRAVENOUS | Status: DC

## 2015-06-23 MED ORDER — OXYCODONE HCL 5 MG PO TABS
5.0000 mg | ORAL_TABLET | ORAL | Status: DC | PRN
  Administered 2015-06-23: 10 mg via ORAL
  Administered 2015-06-24: 5 mg via ORAL
  Filled 2015-06-23 (×2): qty 1
  Filled 2015-06-23: qty 2

## 2015-06-23 MED ORDER — DEXAMETHASONE SODIUM PHOSPHATE 10 MG/ML IJ SOLN
10.0000 mg | Freq: Once | INTRAMUSCULAR | Status: AC
  Administered 2015-06-23: 10 mg via INTRAVENOUS

## 2015-06-23 MED ORDER — SODIUM CHLORIDE 0.45 % IV SOLN
INTRAVENOUS | Status: DC
  Administered 2015-06-23 – 2015-06-24 (×2): via INTRAVENOUS

## 2015-06-23 MED ORDER — BUPIVACAINE LIPOSOME 1.3 % IJ SUSP
INTRAMUSCULAR | Status: DC | PRN
  Administered 2015-06-23: 20 mL

## 2015-06-23 MED ORDER — POLYETHYLENE GLYCOL 3350 17 G PO PACK: 17.0000 g | PACK | Freq: Every day | ORAL | Status: DC | PRN

## 2015-06-23 MED ORDER — DEXAMETHASONE SODIUM PHOSPHATE 10 MG/ML IJ SOLN
10.0000 mg | Freq: Once | INTRAMUSCULAR | Status: AC
  Administered 2015-06-24: 10 mg via INTRAVENOUS
  Filled 2015-06-23: qty 1

## 2015-06-23 MED ORDER — ACETAMINOPHEN 500 MG PO TABS
1000.0000 mg | ORAL_TABLET | Freq: Four times a day (QID) | ORAL | Status: AC
  Administered 2015-06-23 – 2015-06-24 (×4): 1000 mg via ORAL
  Filled 2015-06-23 (×5): qty 2

## 2015-06-23 MED ORDER — STERILE WATER FOR IRRIGATION IR SOLN
Status: DC | PRN
  Administered 2015-06-23: 2000 mL

## 2015-06-23 MED ORDER — METHOCARBAMOL 500 MG PO TABS: 500.0000 mg | ORAL_TABLET | Freq: Four times a day (QID) | ORAL | Status: DC | PRN

## 2015-06-23 MED ORDER — FENTANYL CITRATE (PF) 100 MCG/2ML IJ SOLN: 25.0000 ug | INTRAMUSCULAR | Status: DC | PRN

## 2015-06-23 MED ORDER — PROPOFOL 500 MG/50ML IV EMUL
INTRAVENOUS | Status: DC | PRN
  Administered 2015-06-23: 50 ug/kg/min via INTRAVENOUS

## 2015-06-23 MED ORDER — MIDAZOLAM HCL 5 MG/5ML IJ SOLN
INTRAMUSCULAR | Status: DC | PRN
  Administered 2015-06-23 (×2): 1 mg via INTRAVENOUS

## 2015-06-23 MED ORDER — BUPIVACAINE HCL 0.25 % IJ SOLN
INTRAMUSCULAR | Status: DC | PRN
  Administered 2015-06-23: 20 mL

## 2015-06-23 MED ORDER — CETYLPYRIDINIUM CHLORIDE 0.05 % MT LIQD: 7.0000 mL | Freq: Two times a day (BID) | OROMUCOSAL | Status: DC

## 2015-06-23 MED ORDER — ACETAMINOPHEN 10 MG/ML IV SOLN
INTRAVENOUS | Status: AC
  Filled 2015-06-23: qty 100

## 2015-06-23 MED ORDER — BUPIVACAINE LIPOSOME 1.3 % IJ SUSP
20.0000 mL | Freq: Once | INTRAMUSCULAR | Status: DC
  Filled 2015-06-23: qty 20

## 2015-06-23 MED ORDER — HYDROMORPHONE HCL 1 MG/ML IJ SOLN
0.5000 mg | INTRAMUSCULAR | Status: DC | PRN
  Administered 2015-06-24: 0.5 mg via INTRAVENOUS
  Filled 2015-06-23 (×2): qty 1

## 2015-06-23 MED ORDER — PHENOL 1.4 % MT LIQD
1.0000 | OROMUCOSAL | Status: DC | PRN
  Filled 2015-06-23: qty 177

## 2015-06-23 MED ORDER — LEVOTHYROXINE SODIUM 112 MCG PO TABS
112.0000 ug | ORAL_TABLET | Freq: Every day | ORAL | Status: DC
  Administered 2015-06-23 – 2015-06-25 (×3): 112 ug via ORAL
  Filled 2015-06-23 (×4): qty 1

## 2015-06-23 MED ORDER — METHOCARBAMOL 1000 MG/10ML IJ SOLN
500.0000 mg | Freq: Four times a day (QID) | INTRAVENOUS | Status: DC | PRN
  Administered 2015-06-23: 500 mg via INTRAVENOUS
  Filled 2015-06-23 (×2): qty 5

## 2015-06-23 MED ORDER — CYCLOSPORINE 0.05 % OP EMUL
1.0000 [drp] | Freq: Two times a day (BID) | OPHTHALMIC | Status: DC
  Administered 2015-06-23 – 2015-06-25 (×4): 1 [drp] via OPHTHALMIC
  Filled 2015-06-23 (×5): qty 1

## 2015-06-23 SURGICAL SUPPLY — 51 items
BAG DECANTER FOR FLEXI CONT (MISCELLANEOUS) ×2 IMPLANT
BAG ZIPLOCK 12X15 (MISCELLANEOUS) ×2 IMPLANT
BANDAGE ACE 6X5 VEL STRL LF (GAUZE/BANDAGES/DRESSINGS) ×2 IMPLANT
BLADE SAG 18X100X1.27 (BLADE) ×2 IMPLANT
BLADE SAW SGTL 11.0X1.19X90.0M (BLADE) ×2 IMPLANT
BNDG CONFORM 6X.82 1P STRL (GAUZE/BANDAGES/DRESSINGS) ×2 IMPLANT
BOWL SMART MIX CTS (DISPOSABLE) ×2 IMPLANT
CAP KNEE TOTAL 3 SIGMA ×2 IMPLANT
CEMENT HV SMART SET (Cement) ×4 IMPLANT
CLOTH BEACON ORANGE TIMEOUT ST (SAFETY) ×2 IMPLANT
CUFF TOURN SGL QUICK 34 (TOURNIQUET CUFF) ×1
CUFF TRNQT CYL 34X4X40X1 (TOURNIQUET CUFF) ×1 IMPLANT
DECANTER SPIKE VIAL GLASS SM (MISCELLANEOUS) ×2 IMPLANT
DRAPE U-SHAPE 47X51 STRL (DRAPES) ×2 IMPLANT
DRSG ADAPTIC 3X8 NADH LF (GAUZE/BANDAGES/DRESSINGS) ×2 IMPLANT
DRSG PAD ABDOMINAL 8X10 ST (GAUZE/BANDAGES/DRESSINGS) ×2 IMPLANT
DURAPREP 26ML APPLICATOR (WOUND CARE) ×2 IMPLANT
ELECT REM PT RETURN 9FT ADLT (ELECTROSURGICAL) ×2
ELECTRODE REM PT RTRN 9FT ADLT (ELECTROSURGICAL) ×1 IMPLANT
EVACUATOR 1/8 PVC DRAIN (DRAIN) ×2 IMPLANT
GAUZE SPONGE 4X4 12PLY STRL (GAUZE/BANDAGES/DRESSINGS) ×2 IMPLANT
GLOVE BIO SURGEON STRL SZ7.5 (GLOVE) IMPLANT
GLOVE BIO SURGEON STRL SZ8 (GLOVE) ×2 IMPLANT
GLOVE BIOGEL PI IND STRL 6.5 (GLOVE) IMPLANT
GLOVE BIOGEL PI IND STRL 8 (GLOVE) ×1 IMPLANT
GLOVE BIOGEL PI INDICATOR 6.5 (GLOVE)
GLOVE BIOGEL PI INDICATOR 8 (GLOVE) ×1
GLOVE SURG SS PI 6.5 STRL IVOR (GLOVE) IMPLANT
GOWN STRL REUS W/TWL LRG LVL3 (GOWN DISPOSABLE) ×2 IMPLANT
GOWN STRL REUS W/TWL XL LVL3 (GOWN DISPOSABLE) IMPLANT
HANDPIECE INTERPULSE COAX TIP (DISPOSABLE) ×1
IMMOBILIZER KNEE 20 (SOFTGOODS) ×2
IMMOBILIZER KNEE 20 THIGH 36 (SOFTGOODS) ×1 IMPLANT
MANIFOLD NEPTUNE II (INSTRUMENTS) ×2 IMPLANT
NS IRRIG 1000ML POUR BTL (IV SOLUTION) ×2 IMPLANT
PACK TOTAL KNEE CUSTOM (KITS) ×2 IMPLANT
PADDING CAST COTTON 6X4 STRL (CAST SUPPLIES) ×6 IMPLANT
POSITIONER SURGICAL ARM (MISCELLANEOUS) ×2 IMPLANT
SET HNDPC FAN SPRY TIP SCT (DISPOSABLE) ×1 IMPLANT
STRIP CLOSURE SKIN 1/2X4 (GAUZE/BANDAGES/DRESSINGS) ×4 IMPLANT
SUT MNCRL AB 4-0 PS2 18 (SUTURE) ×2 IMPLANT
SUT VIC AB 2-0 CT1 27 (SUTURE) ×3
SUT VIC AB 2-0 CT1 TAPERPNT 27 (SUTURE) ×3 IMPLANT
SUT VLOC 180 0 24IN GS25 (SUTURE) ×2 IMPLANT
SYR 50ML LL SCALE MARK (SYRINGE) ×2 IMPLANT
TAPE STRIPS DRAPE STRL (GAUZE/BANDAGES/DRESSINGS) ×2 IMPLANT
TRAY FOLEY W/METER SILVER 14FR (SET/KITS/TRAYS/PACK) ×2 IMPLANT
TRAY FOLEY W/METER SILVER 16FR (SET/KITS/TRAYS/PACK) ×2 IMPLANT
WATER STERILE IRR 1500ML POUR (IV SOLUTION) ×2 IMPLANT
WRAP KNEE MAXI GEL POST OP (GAUZE/BANDAGES/DRESSINGS) ×2 IMPLANT
YANKAUER SUCT BULB TIP 10FT TU (MISCELLANEOUS) ×2 IMPLANT

## 2015-06-23 NOTE — Transfer of Care (Signed)
Immediate Anesthesia Transfer of Care Note  Patient: Kimberly [name]  Procedure(s) Performed: Procedure(s): LEFT TOTAL KNEE ARTHROPLASTY (Left)  Patient Location: PACU  Anesthesia Type:Spinal  Level of Consciousness: awake, alert  and oriented  Airway & Oxygen Therapy: Patient Spontanous Breathing and Patient connected to face mask oxygen  Post-op Assessment: Report given to RN and Post -op Vital signs reviewed and stable  Post vital signs: Reviewed and stable  Last Vitals:  Filed Vitals:   06/23/15 0507  BP: 140/84  Pulse: 77  Temp: 36.5 C  Resp: 18    Complications: No apparent anesthesia complications

## 2015-06-23 NOTE — Interval H&P Note (Signed)
History and Physical Interval Note:  06/23/2015 7:00 AM  Kimberly [name]  has presented today for surgery, with the diagnosis of OA LEFT KNEE  The various methods of treatment have been discussed with the patient and family. After consideration of risks, benefits and other options for treatment, the patient has consented to  Procedure(s): LEFT TOTAL KNEE ARTHROPLASTY (Left) as a surgical intervention .  The patient's history has been reviewed, patient examined, no change in status, stable for surgery.  I have reviewed the patient's chart and labs.  Questions were answered to the patient's satisfaction.     Gearlean Alf

## 2015-06-23 NOTE — H&P (View-Only) (Signed)
Kimberly [name] DOB: 07-16-1940 Married / Language: English / Race: White Female Date of Admission:  06/27/2015 CC:  Left Knee Pain History of Present Illness The patient is a 75 year old female who comes in for a preoperative History and Physical. The patient is scheduled for a left total knee arthroplasty to be performed by Dr. Dione Plover. Aluisio, MD at [facility] on 06-23-2015. The patient is a 75 year old female who presented with knee complaints. The patient reports left knee symptoms including: pain (lateral) and swelling which began over 5 years ago without any known injury. Patient states that she has been having pain in her knee. She said that over the past five years it seems to be progressively turning inward. She has been going to see Dr.Wainer for her left knee. She has tried cortisone injections, therapy, and a knee scope in the past. The cortisone injections usually last a few months. The patient feels that the symptoms are worsening. Previous work-up for this problem has included knee x-rays. Symptoms are relieved by rest. She has noticed that both knees feel weak but feels that is a result of her knee and back pain. She had the right knee replaced by Dr. Noemi Chapel about three years ago. She states she did very well with that. She said that the left knee has gotten progressively worse. Especially over the past six months to one year, she had a marked increase in pain and dysfunction. She has had cortisone injections. She is at a stage now where the left knee is bothering her as bad as the right one did prior to when she had surgery on the right. The injections have provided a very little short-term relief. She is not having any hip pain associated with this. She is not having any right lower extremity pain. She is ready to proceed with surgery at this time. They have been treated conservatively in the past for the above stated problem and despite conservative measures, they continue to  have progressive pain and severe functional limitations and dysfunction. They have failed non-operative management including home exercise, medications, and injections. It is felt that they would benefit from undergoing total joint replacement. Risks and benefits of the procedure have been discussed with the patient and they elect to proceed with surgery. There are no active contraindications to surgery such as ongoing infection or rapidly progressive neurological disease.  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)  Cerebrovascular Accident  Lacunar Stroke Migraine Headache  Impaired Vision  uses glasses Vertigo  Shingles  Fall 2016 Cataract  Asthma  Bronchitis  Pneumonia  Childhood Illness Sleep Apnea  uses CPAP Allergic Urticaria  Anxiety Disorder  Varicose veins  Hiatal Hernia  Gastroesophageal Reflux Disease  Hemorrhoids  Irritable bowel syndrome  Ulcerative Colitis  Pancreatitis  Past History Urinary Tract Infection  Hypothyroidism  Fibromyalgia  Osteoporosis  Osteoarthritis  Degenerative Disc Disease  Measles  Mumps  Menopause  Allergies Atorvastatin Calcium *ANTIHYPERLIPIDEMICS*  muscle weakness ERYTHROMYCIN 11/19/2010 Hives. Ketoconazole *ANTIFUNGALS*  Morphine Sulfate *ANALGESICS - OPIOID*  Simvastatin *ANTIHYPERLIPIDEMICS*  muscle pain Codeine Sulfate *ANALGESICS - OPIOID*  nausea Augmentin *PENICILLINS*  Nausea. Clindamycin HCl *CHEMICALS*  Rash. Azithromycin *CHEMICALS*  pancreatitis Shellfish  No prior issues with Betadine or Iodine  Family History Cerebrovascular Accident  Mother. mother Chronic Obstructive Lung Disease  Sister. Congestive  Heart Failure  Father. father Depression  Sister. mother and  father Drug / Alcohol Addiction  father Diabetes Mellitus  Sister. First Degree Relatives  reported Heart Disease  Father, Sister. father, grandfather mothers side and grandfather fathers side Heart disease in female family member before age 54  Hypertension  Father, Sister. father Kidney disease  Sister. Osteoarthritis  Brother, Father, Maternal Grandfather, Mother, Sister. mother, father, sister, brother, grandmother fathers side and grandfather fathers side Osteoporosis  Maternal Grandfather, Sister. Severe allergy  Sister. 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 10/24/2014: smoke(d) 1 pack(s) per day, former smoker Not under pain contract  Number of flights of stairs before winded  2-3 Former drinker  10/24/2014: In the past drank wine only occasionally per week No alcohol use  Drug/Alcohol Rehab (Previously)  no Illicit drug use  no Pain Contract  no Current work status  working part time Exercise  Exercises daily; does other, Exercises weekly; does running / walking Drug/Alcohol Rehab (Currently)  no Children  1  Medication History ProAir HFA (108 (90 Base)MCG/ACT Aerosol Soln, Inhalation as needed) Active. Align (Oral) Active. Crestor (10MG  Tablet, Oral) Active. Singulair (Oral) Specific strength unknown - Active. Fluticasone Propionate (Nasal) Specific strength unknown - Active. EpiPen (1:1000 Device, Injection) Active. Levothyroxine Sodium (125MCG Tablet, Oral) Active. Caltrate 600 (Oral) Specific strength unknown - Active. MiraLax (Oral) Active. Aspirin EC (81MG  Tablet DR, Oral) Active. Omeprazole (20MG  Capsule DR, Oral) Active. Vitamin C (500MG  Tablet, 1 (one) Oral) Active. Vitamin  B-12 (1000MCG Tablet, 1 (one) Oral) Active. Valtrex (Oral) Specific strength unknown - Active. (For recent Shingles Bout)  Past Surgical History Anal Fissure Repair  Arthroscopy of Knee  bilateral Breast Biopsy  left Breast Mass; Local Excision  left Cataract Surgery  bilateral Colon Polyp Removal - Colonoscopy  Foot Surgery  left Sinus Surgery  Hemorrhoidectomy  Hysterectomy  complete (non-cancerous) complete (cancerous) Total Hip Replacement  right Straighten Nasal Septum  Tonsillectomy  Total Knee Replacement  right   Review of Systems General Present- Chills. Not Present- Fatigue, Fever, Memory Loss, Night Sweats, Weight Gain and Weight Loss. Skin Present- Itching and Rash. Not Present- Eczema, Hives and Lesions. HEENT Not Present- Dentures, Double Vision, Headache, Hearing Loss, Tinnitus and Visual Loss. Respiratory Not Present- Allergies, Chronic Cough, Coughing up blood, Shortness of breath at rest and Shortness of breath with exertion. Cardiovascular Not Present- Chest Pain, Difficulty Breathing Lying Down, Murmur, Palpitations, Racing/skipping heartbeats and Swelling. Gastrointestinal Present- Bloody Stool (Past history but not in the past year), Constipation, Heartburn (intermittent), Nausea and Vomiting. Not Present- Abdominal Pain, Diarrhea, Difficulty Swallowing, Jaundice and Loss of appetitie. Female Genitourinary Not Present- Blood in Urine, Discharge, Flank Pain, Incontinence, Painful Urination, Urgency, Urinary frequency, Urinary Retention, Urinating at Night and Weak urinary stream. Musculoskeletal Present- Back Pain, Joint Pain, Morning Stiffness, Muscle Pain and Spasms. Not Present- Joint Swelling and Muscle Weakness. Neurological Not Present- Blackout spells, Difficulty with balance, Dizziness, Paralysis, Tremor and Weakness. Psychiatric Not Present- Insomnia.  Vitals Weight: 181 lb Height: 67in Weight was reported by patient. Height was  reported by patient. Body Surface Area: 1.94 m Body Mass Index: 28.35 kg/m  BP: 140/82 (Sitting, Right Arm, Standard)   Physical Exam General Mental Status -Alert, cooperative and good historian. General Appearance-pleasant, Not  in acute distress. Orientation-Oriented X3. Build & Nutrition-Well nourished and Well developed.  Head and Neck Head-normocephalic, atraumatic . Neck Global Assessment - supple, no bruit auscultated on the right, no bruit auscultated on the left.  Eye Vision-Wears corrective lenses. 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  Pupil - Bilateral-Regular and Round. Motion - Bilateral-EOMI.  Chest and Lung Exam Auscultation Breath sounds - clear at anterior chest wall and clear at posterior chest wall. Adventitious sounds - No Adventitious sounds.  Cardiovascular Auscultation Rhythm - Regular rate and rhythm. Heart Sounds - S1 WNL and S2 WNL. Murmurs & Other Heart Sounds - Auscultation of the heart reveals - No Murmurs.  Abdomen Palpation/Percussion Tenderness - Abdomen is non-tender to palpation. Rigidity (guarding) - Abdomen is soft. Auscultation Auscultation of the abdomen reveals - Bowel sounds normal.  Female Genitourinary Note: Not done, not pertinent to present illness  Assessment & Plan Primary osteoarthritis of left knee (M17.12)  Note:Surgical Plans: Left Total Knee Replacement  Disposition: Home with husband  PCP: Dr. Felipa Eth - Patient has been seen preoperatively and felt to be stable for surgery. Cards: Dr. Radford Pax - Patient has been seen preoperatively and felt to be stable for surgery.  Topical TXA - History of CVA  Anesthesia Issues: None except for some nausea in the past  Signed electronically by Joelene Millin, III PA-C

## 2015-06-23 NOTE — Anesthesia Postprocedure Evaluation (Signed)
Anesthesia Post Note  Patient: Kimberly [name]  Procedure(s) Performed: Procedure(s) (LRB): LEFT TOTAL KNEE ARTHROPLASTY (Left)  Patient location during evaluation: PACU Anesthesia Type: Spinal Level of consciousness: awake Pain management: pain level controlled Vital Signs Assessment: post-procedure vital signs reviewed and stable Respiratory status: spontaneous breathing Cardiovascular status: stable Postop Assessment: spinal receding Anesthetic complications: no    Last Vitals:  Filed Vitals:   06/23/15 0916 06/23/15 0930  BP: 145/83 154/87  Pulse: 76 74  Temp:    Resp: 23 14    Last Pain:  Filed Vitals:   06/23/15 0936  PainSc: 0-No pain                 EDWARDS,Shant Hence

## 2015-06-23 NOTE — Progress Notes (Signed)
Utilization review completed.  

## 2015-06-23 NOTE — Evaluation (Signed)
Physical Therapy Evaluation Patient Details Name: Kimberly [name] MRN: [id number] DOB: Nov 13, 1940 Today's Date: 06/23/2015   History of Present Illness  Pt is a 75 year old female s/p L TKA with hx of R TKA  Clinical Impression  Pt is s/p L TKA resulting in the deficits listed below (see PT Problem List).  Pt will benefit from skilled PT to increase their independence and safety with mobility to allow discharge to the venue listed below.  Pt assisted with ambulating 40 feet POD #0 and was a little lightheaded and nauseated upon returning to recliner however felt better with rest and cool washcloth.  Pt plans to d/c home with spouse.     Follow Up Recommendations Home health PT    Equipment Recommendations  None recommended by PT    Recommendations for Other Services       Precautions / Restrictions Precautions Precautions: Fall Required Braces or Orthoses: Knee Immobilizer - Left Knee Immobilizer - Left: Discontinue once straight leg raise with < 10 degree lag Restrictions Other Position/Activity Restrictions: WBAT      Mobility  Bed Mobility Overal bed mobility: Needs Assistance Bed Mobility: Supine to Sit     Supine to sit: Min assist     General bed mobility comments: verbal cues for technique, assist for L LE  Transfers Overall transfer level: Needs assistance Equipment used: Rolling walker (2 wheeled) Transfers: Sit to/from Stand Sit to Stand: Min assist         General transfer comment: assist to rise and steady, verbal cues for UE and LE positioning  Ambulation/Gait Ambulation/Gait assistance: Min guard Ambulation Distance (Feet): 40 Feet Assistive device: Rolling walker (2 wheeled) Gait Pattern/deviations: Step-to pattern;Antalgic;Decreased stance time - left     General Gait Details: verbal cues for sequence, RW positioning, step length  Stairs            Wheelchair Mobility    Modified Rankin (Stroke Patients Only)       Balance                                              Pertinent Vitals/Pain Pain Assessment: 0-10 Pain Score: 3  Pain Location: L knee Pain Descriptors / Indicators: Sore;Aching Pain Intervention(s): Limited activity within patient's tolerance;Monitored during session;Repositioned;Premedicated before session    Home Living Family/patient expects to be discharged to:: Private residence Living Arrangements: Spouse/significant other   Type of Home: House Home Access: Stairs to enter   Technical brewer of Steps: 1 Home Layout: One level Home Equipment: Environmental consultant - 2 wheels;Bedside commode      Prior Function Level of Independence: Independent               Hand Dominance        Extremity/Trunk Assessment               Lower Extremity Assessment: LLE deficits/detail   LLE Deficits / Details: unable to perform SLR, fair quad contraction, maintained KI     Communication   Communication: No difficulties  Cognition Arousal/Alertness: Awake/alert Behavior During Therapy: WFL for tasks assessed/performed Overall Cognitive Status: Within Functional Limits for tasks assessed                      General Comments      Exercises        Assessment/Plan  PT Assessment Patient needs continued PT services  PT Diagnosis Difficulty walking;Acute pain   PT Problem List Decreased strength;Decreased mobility;Pain;Decreased range of motion  PT Treatment Interventions Functional mobility training;Gait training;DME instruction;Stair training;Patient/family education;Therapeutic activities;Therapeutic exercise   PT Goals (Current goals can be found in the Care Plan section) Acute Rehab PT Goals PT Goal Formulation: With patient Time For Goal Achievement: 06/28/15 Potential to Achieve Goals: Good    Frequency 7X/week   Barriers to discharge        Co-evaluation               End of Session Equipment Utilized During Treatment: Gait  belt Activity Tolerance: Patient tolerated treatment well Patient left: in chair;with call bell/phone within reach;with family/visitor present Nurse Communication: Mobility status         Time: [id number] PT Time Calculation (min) (ACUTE ONLY): 20 min   Charges:   PT Evaluation $PT Eval Moderate Complexity: 1 Procedure     PT G Codes:        Kimberly [name],KATHrine E 06/23/2015, 4:17 PM Carmelia Bake, PT, DPT 06/23/2015 Pager: [phone]

## 2015-06-23 NOTE — Op Note (Signed)
Pre-operative diagnosis- Osteoarthritis  Left knee(s)  Post-operative diagnosis- Osteoarthritis Left knee(s)  Procedure-  Left  Total Knee Arthroplasty  Surgeon- Dione Plover. Mandisa Persinger, MD  Assistant- Ardeen Jourdain, PA-C   Anesthesia-  Spinal  EBL-* No blood loss amount entered *   Drains Hemovac  Tourniquet time-  Total Tourniquet Time Documented: Thigh (Left) - 31 minutes Total: Thigh (Left) - 31 minutes     Complications- None  Condition-PACU - hemodynamically stable.   Brief Clinical Note  Kimberly [name] is a 75 y.o. year old female with end stage OA of her left knee with progressively worsening pain and dysfunction. She has constant pain, with activity and at rest and significant functional deficits with difficulties even with ADLs. She has had extensive non-op management including analgesics, injections of cortisone, and home exercise program, but remains in significant pain with significant dysfunction. Radiographs show bone on bone arthritis lateral and patellofemoral. She presents now for left Total Knee Arthroplasty.    Procedure in detail---   The patient is brought into the operating room and positioned supine on the operating table. After successful administration of  Spinal,   a tourniquet is placed high on the  Left thigh(s) and the lower extremity is prepped and draped in the usual sterile fashion. Time out is performed by the operating team and then the  Left lower extremity is wrapped in Esmarch, knee flexed and the tourniquet inflated to 300 mmHg.       A midline incision is made with a ten blade through the subcutaneous tissue to the level of the extensor mechanism. A fresh blade is used to make a medial parapatellar arthrotomy. Soft tissue over the proximal medial tibia is subperiosteally elevated to the joint line with a knife and into the semimembranosus bursa with a Cobb elevator. Soft tissue over the proximal lateral tibia is elevated with attention being paid to  avoiding the patellar tendon on the tibial tubercle. The patella is everted, knee flexed 90 degrees and the ACL and PCL are removed. Findings are bone on bone lateral and patellofemoral with large lateral and patellar osteophytes.        The drill is used to create a starting hole in the distal femur and the canal is thoroughly irrigated with sterile saline to remove the fatty contents. The 5 degree Left  valgus alignment guide is placed into the femoral canal and the distal femoral cutting block is pinned to remove 10 mm off the distal femur. Resection is made with an oscillating saw.      The tibia is subluxed forward and the menisci are removed. The extramedullary alignment guide is placed referencing proximally at the medial aspect of the tibial tubercle and distally along the second metatarsal axis and tibial crest. The block is pinned to remove 3mm off the more deficient lateral  side. Resection is made with an oscillating saw. Size 3is the most appropriate size for the tibia and the proximal tibia is prepared with the modular drill and keel punch for that size.      The femoral sizing guide is placed and size 4 is most appropriate. Rotation is marked off the epicondylar axis and confirmed by creating a rectangular flexion gap at 90 degrees. The size 4 cutting block is pinned in this rotation and the anterior, posterior and chamfer cuts are made with the oscillating saw. The intercondylar block is then placed and that cut is made.      Trial size 3 tibial component,  trial size 4 narrow posterior stabilized femur and a 10  mm posterior stabilized rotating platform insert trial is placed. Full extension is achieved with excellent varus/valgus and anterior/posterior balance throughout full range of motion. The patella is everted and thickness measured to be 24  mm. Free hand resection is taken to 14 mm, a 38 template is placed, lug holes are drilled, trial patella is placed, and it tracks normally.  Osteophytes are removed off the posterior femur with the trial in place. All trials are removed and the cut bone surfaces prepared with pulsatile lavage. Cement is mixed and once ready for implantation, the size 3 tibial implant, size  4 narrowposterior stabilized femoral component, and the size 38 patella are cemented in place and the patella is held with the clamp. The trial insert is placed and the knee held in full extension. The Exparel (20 ml mixed with 30 ml saline) and .25% Bupivicaine, are injected into the extensor mechanism, posterior capsule, medial and lateral gutters and subcutaneous tissues.  All extruded cement is removed and once the cement is hard the permanent 10 mm posterior stabilized rotating platform insert is placed into the tibial tray.      The wound is copiously irrigated with saline solution and the extensor mechanism closed over a hemovac drain with #1 V-loc suture. The tourniquet is released for a total tourniquet time of 31  minutes. Flexion against gravity is 140 degrees and the patella tracks normally. Subcutaneous tissue is closed with 2.0 vicryl and subcuticular with running 4.0 Monocryl. The incision is cleaned and dried and steri-strips and a bulky sterile dressing are applied. The limb is placed into a knee immobilizer and the patient is awakened and transported to recovery in stable condition.      Please note that a surgical assistant was a medical necessity for this procedure in order to perform it in a safe and expeditious manner. Surgical assistant was necessary to retract the ligaments and vital neurovascular structures to prevent injury to them and also necessary for proper positioning of the limb to allow for anatomic placement of the prosthesis.   Dione Plover Naliyah Neth, MD    06/23/2015, 8:20 AM

## 2015-06-23 NOTE — Anesthesia Procedure Notes (Signed)
Spinal Patient location during procedure: OR Start time: 06/23/2015 7:17 AM End time: 06/23/2015 7:25 AM Staffing Anesthesiologist: Finis Bud Performed by: anesthesiologist  Preanesthetic Checklist Completed: patient identified, site marked, surgical consent, pre-op evaluation, timeout performed, IV checked, risks and benefits discussed and monitors and equipment checked Spinal Block Patient position: sitting Prep: Betadine Patient monitoring: heart rate, cardiac monitor, continuous pulse ox and blood pressure Approach: midline Location: L3-4 Injection technique: single-shot Needle Needle gauge: 22 G Assessment Sensory level: T12

## 2015-06-23 NOTE — Progress Notes (Signed)
Patient states she has  had a slightly productive cough that started yesterday. Informed her she will be checked out by anesthesia and will determine if she is ok to proceed with surgery . She state she has hd chills for several months.

## 2015-06-23 NOTE — Anesthesia Preprocedure Evaluation (Signed)
Anesthesia Evaluation  Patient identified by MRN, date of birth, ID band Patient awake    Reviewed: Allergy & Precautions, NPO status   Airway Mallampati: II  TM Distance: >3 FB Neck ROM: Full    Dental   Pulmonary asthma , sleep apnea , pneumonia, former smoker,    breath sounds clear to auscultation       Cardiovascular + Peripheral Vascular Disease   Rhythm:Regular Rate:Normal     Neuro/Psych    GI/Hepatic hiatal hernia, PUD, GERD  ,  Endo/Other  Hypothyroidism   Renal/GU      Musculoskeletal  (+) Arthritis ,   Abdominal   Peds  Hematology  (+) anemia ,   Anesthesia Other Findings   Reproductive/Obstetrics                             Anesthesia Physical Anesthesia Plan  ASA: III  Anesthesia Plan: Spinal   Post-op Pain Management:    Induction:   Airway Management Planned: Simple Face Mask  Additional Equipment:   Intra-op Plan:   Post-operative Plan:   Informed Consent: I have reviewed the patients History and Physical, chart, labs and discussed the procedure including the risks, benefits and alternatives for the proposed anesthesia with the patient or authorized representative who has indicated his/her understanding and acceptance.   Dental advisory given  Plan Discussed with: CRNA  Anesthesia Plan Comments:         Anesthesia Quick Evaluation

## 2015-06-24 LAB — BASIC METABOLIC PANEL
Anion gap: 7 (ref 5–15)
BUN: 11 mg/dL (ref 6–20)
CO2: 25 mmol/L (ref 22–32)
Calcium: 8.9 mg/dL (ref 8.9–10.3)
Chloride: 104 mmol/L (ref 101–111)
Creatinine, Ser: 0.63 mg/dL (ref 0.44–1.00)
GFR calc Af Amer: >60 mL/min (ref >60)
GFR calc non Af Amer: >60 mL/min (ref >60)
Glucose, Bld: 110 mg/dL — ABNORMAL HIGH (ref 65–99)
Potassium: 3.2 mmol/L — ABNORMAL LOW (ref 3.5–5.1)
Sodium: 136 mmol/L (ref 135–145)

## 2015-06-24 LAB — CBC
HCT: 34 % — ABNORMAL LOW (ref 36.0–46.0)
Hemoglobin: 11.3 g/dL — ABNORMAL LOW (ref 12.0–15.0)
MCH: 31.5 pg (ref 26.0–34.0)
MCHC: 33.2 g/dL (ref 30.0–36.0)
MCV: 94.7 fL (ref 78.0–100.0)
Platelets: 193 10*3/uL (ref 150–400)
RBC: 3.59 MIL/uL — ABNORMAL LOW (ref 3.87–5.11)
RDW: 13.4 % (ref 11.5–15.5)
WBC: 8.8 10*3/uL (ref 4.0–10.5)

## 2015-06-24 MED ORDER — PROMETHAZINE HCL 25 MG PO TABS
12.5000 mg | ORAL_TABLET | ORAL | Status: DC | PRN
  Administered 2015-06-24 (×3): 12.5 mg via ORAL
  Filled 2015-06-24 (×3): qty 1

## 2015-06-24 MED ORDER — OXYCODONE HCL 5 MG PO TABS
5.0000 mg | ORAL_TABLET | ORAL | Status: DC | PRN
  Administered 2015-06-24 – 2015-06-25 (×4): 5 mg via ORAL
  Filled 2015-06-24 (×5): qty 1

## 2015-06-24 MED ORDER — HYDROMORPHONE HCL 2 MG PO TABS
2.0000 mg | ORAL_TABLET | ORAL | Status: DC | PRN
  Filled 2015-06-24: qty 1

## 2015-06-24 MED ORDER — NON FORMULARY: 20.0000 mg | Freq: Every day | Status: DC

## 2015-06-24 MED ORDER — OMEPRAZOLE 20 MG PO CPDR
20.0000 mg | DELAYED_RELEASE_CAPSULE | Freq: Every day | ORAL | Status: DC
  Administered 2015-06-25: 20 mg via ORAL
  Filled 2015-06-24 (×2): qty 1

## 2015-06-24 NOTE — Progress Notes (Signed)
Pt has decided not to wear CPAP for the night.  RT to monitor and assess as needed.

## 2015-06-24 NOTE — Discharge Instructions (Addendum)
° °Dr. Frank Aluisio °Total Joint Specialist °[facility] °[address] °[place], [place] [address] °[phone] ° °TOTAL KNEE REPLACEMENT POSTOPERATIVE DIRECTIONS ° °Knee Rehabilitation, Guidelines Following Surgery  °Results after knee surgery are often greatly improved when you follow the exercise, range of motion and muscle strengthening exercises prescribed by your doctor. Safety measures are also important to protect the knee from further injury. Any time any of these exercises cause you to have increased pain or swelling in your knee joint, decrease the amount until you are comfortable again and slowly increase them. If you have problems or questions, call your caregiver or physical therapist for advice.  ° °HOME CARE INSTRUCTIONS  °Remove items at home which could result in a fall. This includes throw rugs or furniture in walking pathways.  °· ICE to the affected knee every three hours for 30 minutes at a time and then as needed for pain and swelling.  Continue to use ice on the knee for pain and swelling from surgery. You may notice swelling that will progress down to the foot and ankle.  This is normal after surgery.  Elevate the leg when you are not up walking on it.   °· Continue to use the breathing machine which will help keep your temperature down.  It is common for your temperature to cycle up and down following surgery, especially at night when you are not up moving around and exerting yourself.  The breathing machine keeps your lungs expanded and your temperature down. °· Do not place pillow under knee, focus on keeping the knee straight while resting ° °DIET °You may resume your previous home diet once your are discharged from the hospital. ° °DRESSING / WOUND CARE / SHOWERING °You may shower 3 days after surgery, but keep the wounds dry during showering.  You may use an occlusive plastic wrap (Press'n Seal for example), NO SOAKING/SUBMERGING IN THE BATHTUB.  If the  bandage gets wet, change with a clean dry gauze.  If the incision gets wet, pat the wound dry with a clean towel. °You may start showering once you are discharged home but do not submerge the incision under water. Just pat the incision dry and apply a dry gauze dressing on daily. °Change the surgical dressing daily and reapply a dry dressing each time. ° °ACTIVITY °Walk with your walker as instructed. °Use walker as long as suggested by your caregivers. °Avoid periods of inactivity such as sitting longer than an hour when not asleep. This helps prevent blood clots.  °You may resume a sexual relationship in one month or when given the OK by your doctor.  °You may return to work once you are cleared by your doctor.  °Do not drive a car for 6 weeks or until released by you surgeon.  °Do not drive while taking narcotics. ° °WEIGHT BEARING °Weight bearing as tolerated with assist device (walker, cane, etc) as directed, use it as long as suggested by your surgeon or therapist, typically at least 4-6 weeks. ° °POSTOPERATIVE CONSTIPATION PROTOCOL °Constipation - defined medically as fewer than three stools per week and severe constipation as less than one stool per week. ° °One of the most common issues patients have following surgery is constipation.  Even if you have a regular bowel pattern at home, your normal regimen is likely to be disrupted due to multiple reasons following surgery.  Combination of anesthesia, postoperative narcotics, change in appetite and fluid intake all can affect your bowels.    In order to avoid complications following surgery, here are some recommendations in order to help you during your recovery period. ° °Colace (docusate) - Pick up an over-the-counter form of Colace or another stool softener and take twice a day as long as you are requiring postoperative pain medications.  Take with a full glass of water daily.  If you experience loose stools or diarrhea, hold the colace until you stool forms  back up.  If your symptoms do not get better within 1 week or if they get worse, check with your doctor. ° °Dulcolax (bisacodyl) - Pick up over-the-counter and take as directed by the product packaging as needed to assist with the movement of your bowels.  Take with a full glass of water.  Use this product as needed if not relieved by Colace only.  ° °MiraLax (polyethylene glycol) - Pick up over-the-counter to have on hand.  MiraLax is a solution that will increase the amount of water in your bowels to assist with bowel movements.  Take as directed and can mix with a glass of water, juice, soda, coffee, or tea.  Take if you go more than two days without a movement. °Do not use MiraLax more than once per day. Call your doctor if you are still constipated or irregular after using this medication for 7 days in a row. ° °If you continue to have problems with postoperative constipation, please contact the office for further assistance and recommendations.  If you experience "the worst abdominal pain ever" or develop nausea or vomiting, please contact the office immediatly for further recommendations for treatment. ° °ITCHING ° If you experience itching with your medications, try taking only a single pain pill, or even half a pain pill at a time.  You can also use Benadryl over the counter for itching or also to help with sleep.  ° °TED HOSE STOCKINGS °Wear the elastic stockings on both legs for three weeks following surgery during the day but you may remove then at night for sleeping. ° °MEDICATIONS °See your medication summary on the “After Visit Summary” that the nursing staff will review with you prior to discharge.  You may have some home medications which will be placed on hold until you complete the course of blood thinner medication.  It is important for you to complete the blood thinner medication as prescribed by your surgeon.  Continue your approved medications as instructed at time of  discharge. ° °PRECAUTIONS °If you experience chest pain or shortness of breath - call 911 immediately for transfer to the hospital emergency department.  °If you develop a fever greater that 101 F, purulent drainage from wound, increased redness or drainage from wound, foul odor from the wound/dressing, or calf pain - CONTACT YOUR SURGEON.   °                                                °FOLLOW-UP APPOINTMENTS °Make sure you keep all of your appointments after your operation with your surgeon and caregivers. You should call the office at the above phone number and make an appointment for approximately two weeks after the date of your surgery or on the date instructed by your surgeon outlined in the "After Visit Summary". ° ° °RANGE OF MOTION AND STRENGTHENING EXERCISES  °Rehabilitation of the knee is important following a knee injury or   an operation. After just a few days of immobilization, the muscles of the thigh which control the knee become weakened and shrink (atrophy). Knee exercises are designed to build up the tone and strength of the thigh muscles and to improve knee motion. Often times heat used for twenty to thirty minutes before working out will loosen up your tissues and help with improving the range of motion but do not use heat for the first two weeks following surgery. These exercises can be done on a training (exercise) mat, on the floor, on a table or on a bed. Use what ever works the best and is most comfortable for you Knee exercises include:  °Leg Lifts - While your knee is still immobilized in a splint or cast, you can do straight leg raises. Lift the leg to 60 degrees, hold for 3 sec, and slowly lower the leg. Repeat 10-20 times 2-3 times daily. Perform this exercise against resistance later as your knee gets better.  °Quad and Hamstring Sets - Tighten up the muscle on the front of the thigh (Quad) and hold for 5-10 sec. Repeat this 10-20 times hourly. Hamstring sets are done by pushing the  foot backward against an object and holding for 5-10 sec. Repeat as with quad sets.  °· Leg Slides: Lying on your back, slowly slide your foot toward your buttocks, bending your knee up off the floor (only go as far as is comfortable). Then slowly slide your foot back down until your leg is flat on the floor again. °· Angel Wings: Lying on your back spread your legs to the side as far apart as you can without causing discomfort.  °A rehabilitation program following serious knee injuries can speed recovery and prevent re-injury in the future due to weakened muscles. Contact your doctor or a physical therapist for more information on knee rehabilitation.  ° °IF YOU ARE TRANSFERRED TO A SKILLED REHAB FACILITY °If the patient is transferred to a skilled rehab facility following release from the hospital, a list of the current medications will be sent to the facility for the patient to continue.  When discharged from the skilled rehab facility, please have the facility set up the patient's Home Health Physical Therapy prior to being released. Also, the skilled facility will be responsible for providing the patient with their medications at time of release from the facility to include their pain medication, the muscle relaxants, and their blood thinner medication. If the patient is still at the rehab facility at time of the two week follow up appointment, the skilled rehab facility will also need to assist the patient in arranging follow up appointment in our office and any transportation needs. ° °MAKE SURE YOU:  °Understand these instructions.  °Get help right away if you are not doing well or get worse.  ° ° °Pick up stool softner and laxative for home use following surgery while on pain medications. °Do not submerge incision under water. °Please use good hand washing techniques while changing dressing each day. °May shower starting three days after surgery. °Please use a clean towel to pat the incision dry following  showers. °Continue to use ice for pain and swelling after surgery. °Do not use any lotions or creams on the incision until instructed by your surgeon. ° °Take Xarelto for two and a half more weeks, then discontinue Xarelto. °Once the patient has completed the Xarelto, they may resume the 81 mg Aspirin. ° ° °Information on my medicine - XARELTO® (Rivaroxaban) ° °  This medication education was reviewed with me or my healthcare representative as part of my discharge preparation.  The pharmacist that spoke with me during my hospital stay was:  Runyon, Amanda, RPH ° °Why was Xarelto® prescribed for you? °Xarelto® was prescribed for you to reduce the risk of blood clots forming after orthopedic surgery. The medical term for these abnormal blood clots is venous thromboembolism (VTE). ° °What do you need to know about xarelto® ? °Take your Xarelto® ONCE DAILY at the same time every day. °You may take it either with or without food. ° °If you have difficulty swallowing the tablet whole, you may crush it and mix in applesauce just prior to taking your dose. ° °Take Xarelto® exactly as prescribed by your doctor and DO NOT stop taking Xarelto® without talking to the doctor who prescribed the medication.  Stopping without other VTE prevention medication to take the place of Xarelto® may increase your risk of developing a clot. ° °After discharge, you should have regular check-up appointments with your healthcare provider that is prescribing your Xarelto®.   ° °What do you do if you miss a dose? °If you miss a dose, take it as soon as you remember on the same day then continue your regularly scheduled once daily regimen the next day. Do not take two doses of Xarelto® on the same day.  ° °Important Safety Information °A possible side effect of Xarelto® is bleeding. You should call your healthcare provider right away if you experience any of the following: °? Bleeding from an injury or your nose that does not stop. °? Unusual  colored urine (red or dark brown) or unusual colored stools (red or black). °? Unusual bruising for unknown reasons. °? A serious fall or if you hit your head (even if there is no bleeding). ° °Some medicines may interact with Xarelto® and might increase your risk of bleeding while on Xarelto®. To help avoid this, consult your healthcare provider or pharmacist prior to using any new prescription or non-prescription medications, including herbals, vitamins, non-steroidal anti-inflammatory drugs (NSAIDs) and supplements. ° °This website has more information on Xarelto®: www.xarelto.com. ° ° ° °

## 2015-06-24 NOTE — Progress Notes (Signed)
OT Cancellation Note  Patient Details Name: TYRESE WEEK MRN: [id number] DOB: [birth year]   Cancelled Treatment:    Reason Eval/Treat Not Completed: Other (comment).  Limited by nausea/vomiting with PT. Will check back.  Virgil Lightner 06/24/2015, 10:21 AM  Lesle Chris, OTR/L [phone] 06/24/2015

## 2015-06-24 NOTE — Progress Notes (Signed)
Physical Therapy Treatment Note    06/24/15 1500  PT Visit Information  Last PT Received On 06/24/15  Assistance Needed +1  History of Present Illness Pt is a 75 year old female s/p L TKA with hx of R TKA  PT Time Calculation  PT Start Time (ACUTE ONLY) 1422  PT Stop Time (ACUTE ONLY) 1435  PT Time Calculation (min) (ACUTE ONLY) 13 min  Subjective Data  Subjective Pt assisted with ambulating in hallway, distance limited by fatigue and dizziness.  Precautions  Precautions Fall  Required Braces or Orthoses Knee Immobilizer - Left  Knee Immobilizer - Left Discontinue once straight leg raise with < 10 degree lag  Restrictions  Other Position/Activity Restrictions WBAT  Pain Assessment  Pain Assessment 0-10  Pain Score 5  Pain Location L knee  Pain Descriptors / Indicators Aching;Sore  Pain Intervention(s) Limited activity within patient's tolerance;Monitored during session;Premedicated before session;Ice applied  Cognition  Arousal/Alertness Awake/alert  Behavior During Therapy WFL for tasks assessed/performed  Overall Cognitive Status Within Functional Limits for tasks assessed  Bed Mobility  Overal bed mobility Needs Assistance  Bed Mobility Supine to Sit;Sit to Supine  Supine to sit Min guard  Sit to supine Min guard  General bed mobility comments pt able to self assist L LE, min/guard for safety and lines  Transfers  Overall transfer level Needs assistance  Equipment used Rolling walker (2 wheeled)  Transfers Sit to/from Stand  Sit to Stand Min guard  General transfer comment verbal cues for UE and LE positioning  Ambulation/Gait  Ambulation/Gait assistance Min guard  Ambulation Distance (Feet) 30 Feet  Assistive device Rolling walker (2 wheeled)  Gait Pattern/deviations Step-to pattern;Antalgic;Decreased stance time - left  General Gait Details verbal cues for sequence, RW positioning, step length, pt with dizziness end of ambulation, better once back in bed  PT - End  of Session  Equipment Utilized During Treatment Gait belt;Left knee immobilizer  Activity Tolerance Patient limited by fatigue  Patient left in bed;with call bell/phone within reach;with family/visitor present  PT - Assessment/Plan  PT Plan Current plan remains appropriate  PT Frequency (ACUTE ONLY) 7X/week  Follow Up Recommendations Home health PT  PT equipment None recommended by PT  PT Goal Progression  Progress towards PT goals Progressing toward goals  PT General Charges  $$ ACUTE PT VISIT 1 Procedure  PT Treatments  $Gait Training 8-22 mins   Carmelia Bake, PT, DPT 06/24/2015 Pager: [phone]

## 2015-06-24 NOTE — Progress Notes (Signed)
Subjective: 1 Day Post-Op Procedure(s) (LRB): LEFT TOTAL KNEE ARTHROPLASTY (Left) Patient reports pain as mild.   Patient seen in rounds with Dr. Wynelle Link. Main issue is nausea.  Tried Dilaudid but did not work as well.  Go back to Oxy and add antiemetic. Patient is well, but has had some minor complaints of nausea/vomiting We will start therapy today.  Plan is to go Home after hospital stay.  Objective: Vital signs in last 24 hours: Temp:  [97.5 F (36.4 C)-98.5 F (36.9 C)] 98.1 F (36.7 C) (01/24 0500) Pulse Rate:  [70-91] 72 (01/24 0500) Resp:  [12-18] 18 (01/24 0500) BP: (121-169)/(62-88) 126/72 mmHg (01/24 0500) SpO2:  [97 %-99 %] 99 % (01/24 0500)  Intake/Output from previous day:  Intake/Output Summary (Last 24 hours) at 06/24/15 0931 Last data filed at 06/24/15 0654  Gross per 24 hour  Intake 1611.25 ml  Output   3831 ml  Net -2219.75 ml    Labs:  Recent Labs  06/24/15 0448  HGB 11.3*    Recent Labs  06/24/15 0448  WBC 8.8  RBC 3.59*  HCT 34.0*  PLT 193    Recent Labs  06/24/15 0448  NA 136  K 3.2*  CL 104  CO2 25  BUN 11  CREATININE 0.63  GLUCOSE 110*  CALCIUM 8.9   No results for input(s): LABPT, INR in the last 72 hours.  EXAM General - Patient is Alert, Appropriate and Oriented Extremity - Neurovascular intact Sensation intact distally Dressing - dressing C/D/I Motor Function - intact, moving foot and toes well on exam.  Hemovac pulled without difficulty.  Past Medical History  Diagnosis Date  . Cataracts, bilateral   . Degenerative disc disease   . Bunion   . Ankle fracture     Stress fracture  . 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 IBS (irritable bowel syndrome)   . Depression   . Anxiety   . GERD (gastroesophageal reflux disease)   . Diverticulosis of colon (without mention of hemorrhage) 2010    Colonoscopy  . Stricture and stenosis of esophagus 2005,2010    EGD   . Anal polyp 1998    Flex Sig   . Internal hemorrhoids  without mention of complication 0000000    Colonoscopy   . External hemorrhoids 2000    Colonoscopy  . Family history of malignant neoplasm of gastrointestinal tract   . Hyperlipemia   . Fibromyalgia   . Hiatal hernia 2005,2010    EGD  . Hypothyroidism   . Allergy   . Anemia   . Asthma     border line has inhaler  . Stroke ([place])   . Irregular heart beat   . Pseudophakia, both eyes   . Angular blepharitis of left eye   . Retinal scar     left  . PCO (posterior capsular opacification)     left  . Dry eyes     bilateral  . Corneal scar     left eye  . PVD (posterior vitreous detachment) right  . Rotator cuff disorder     pain, left shoulder  . Migraine   . Anxiety   . IBS (irritable bowel syndrome)   . Pancreatitis   . Osteopenia 10/2013    T score -1.6 FRAX 10%/1.6%  . OSA (obstructive sleep apnea) 05/14/2015  . Shingles   . PONV (postoperative nausea and vomiting)   . Wears glasses   . Vertigo   . History of bronchitis   .  Pneumonia     childhood illness  . Varicose veins   . Ulcerative colitis ([place])   . History of urinary tract infection   . History of measles   . History of mumps   . Menopause   . History of strep sore throat   . Chills (without fever)   . Rash     on back   . Itching   . Imbalance     Assessment/Plan: 1 Day Post-Op Procedure(s) (LRB): LEFT TOTAL KNEE ARTHROPLASTY (Left) Principal Problem:   OA (osteoarthritis) of knee  Estimated body mass index is 29.12 kg/(m^2) as calculated from the following:   Height as of this encounter: 5\' 7"  (1.702 m).   Weight as of this encounter: 84.369 kg (186 lb). Advance diet Up with therapy Plan for discharge tomorrow Discharge home with home health  DVT Prophylaxis - Xarelto Weight-Bearing as tolerated to left leg D/C O2 and Pulse OX and try on Room Air  Arlee Muslim, PA-C Orthopaedic Surgery 06/24/2015, 9:31 AM

## 2015-06-24 NOTE — Care Management Note (Signed)
Case Management Note  Patient Details  Name: Kimberly [name] MRN: [id number] Date of Birth: [birth year]  Subjective/Objective:                  LEFT TOTAL KNEE ARTHROPLASTY (Left) Action/Plan: Discharge planning Expected Discharge Date:  06/25/15               Expected Discharge Plan:  [place]  In-House Referral:     Discharge planning Services  CM Consult  Post Acute Care Choice:    Choice offered to:  Patient  DME Arranged:  N/A DME Agency:  NA  HH Arranged:  PT HH Agency:  [place]  Status of Service:  Completed, signed off  Medicare Important Message Given:    Date Medicare IM Given:    Medicare IM give by:    Date Additional Medicare IM Given:    Additional Medicare Important Message give by:     If discussed at [place] of Stay Meetings, dates discussed:    Additional Comments: CM met with pt in room to offer choice of home health agency.  Pt chooses [facility] to render HHPT.  Referral given to [facility] rep, Tim, (on unit).  Pt has both a rolling walker and 3n1 from previous surgery.  No other CM needs were communicated. Dellie Catholic, RN 06/24/2015, 1:59 PM

## 2015-06-24 NOTE — Evaluation (Signed)
Occupational Therapy Evaluation Patient Details Name: Kimberly [name] MRN: [id number] DOB: Jun 19, 1940 Today's Date: 06/24/2015    History of Present Illness Pt is a 75 year old female s/p L TKA with hx of R TKA   Clinical Impression   Pt was admitted for the above. Will follow in acute setting with supervision to min A level goals. Pt currently needs min A for toilet transfer and up to max A for LB adls. She will have assistance at home.    Follow Up Recommendations  No OT follow up;Supervision/Assistance - 24 hour    Equipment Recommendations  None recommended by OT    Recommendations for Other Services       Precautions / Restrictions Precautions Precautions: Fall Required Braces or Orthoses: Knee Immobilizer - Left Knee Immobilizer - Left: Discontinue once straight leg raise with < 10 degree lag Restrictions Other Position/Activity Restrictions: WBAT      Mobility Bed Mobility Overal bed mobility: Needs Assistance Bed Mobility: Supine to Sit     Supine to sit: Min assist;HOB elevated     General bed mobility comments: assist for LLE in & out of bed  Transfers Overall transfer level: Needs assistance Equipment used: Rolling walker (2 wheeled) Transfers: Sit to/from Stand Sit to Stand: Min assist        General transfer comment: assist to rise and steady; cues for UE/LE placement    Balance                                            ADL Overall ADL's : Needs assistance/impaired     Grooming: Wash/dry hands;Supervision/safety;Standing   Upper Body Bathing: Set up;Sitting   Lower Body Bathing: Moderate assistance;Sit to/from stand   Upper Body Dressing : Set up;Sitting   Lower Body Dressing: Maximal assistance;Sit to/from stand   Toilet Transfer: Minimal assistance;Ambulation;BSC;RW   Toileting- Clothing Manipulation and Hygiene: Set up;Sitting/lateral lean         General ADL Comments: tolerated well.  cues for sequence  when walking.  may have reacher at home--has assistance as needed     Vision     Perception     Praxis      Pertinent Vitals/Pain Pain Assessment: 0-10 Pain Score: 4  Pain Location: L knee Pain Descriptors / Indicators: Sore Pain Intervention(s): Limited activity within patient's tolerance;Monitored during session;Premedicated before session;Repositioned;Ice applied     Hand Dominance     Extremity/Trunk Assessment Upper Extremity Assessment Upper Extremity Assessment: Overall WFL for tasks assessed           Communication     Cognition Arousal/Alertness: Awake/alert Behavior During Therapy: WFL for tasks assessed/performed Overall Cognitive Status: Within Functional Limits for tasks assessed                     General Comments       Exercises       Shoulder Instructions      Home Living Family/patient expects to be discharged to:: Private residence Living Arrangements: Spouse/significant other Available Help at Discharge: Family;Friend(s)               Bathroom Shower/Tub: Walk-in Psychologist, prison and probation services: Standard     Home Equipment: Environmental consultant - 2 wheels;Bedside commode          Prior Functioning/Environment Level of Independence: Independent  OT Diagnosis: Generalized weakness   OT Problem List: Decreased strength;Decreased activity tolerance;Decreased knowledge of use of DME or AE;Pain   OT Treatment/Interventions: Self-care/ADL training;DME and/or AE instruction;Patient/family education    OT Goals(Current goals can be found in the care plan section) Acute Rehab OT Goals Patient Stated Goal: get back to being independent OT Goal Formulation: With patient Time For Goal Achievement: 07/01/15 Potential to Achieve Goals: Good ADL Goals Pt Will Transfer to Toilet: with supervision;ambulating;bedside commode Pt Will Perform Tub/Shower Transfer: Shower transfer;with min guard assist;3 in 1;ambulating Additional ADL  Goal #1: pt will use reacher for LB bathing and donning pants with min guard assist  OT Frequency: Min 2X/week   Barriers to D/C:            Co-evaluation              End of Session    Activity Tolerance: Patient tolerated treatment well Patient left: in bed;with call bell/phone within reach;with family/visitor present   Time: 1326-1350 OT Time Calculation (min): 24 min Charges:  OT General Charges $OT Visit: 1 Procedure OT Evaluation $OT Eval Low Complexity: 1 Procedure OT Treatments $Self Care/Home Management : 8-22 mins G-Codes:    Kanitra Purifoy 23-Jul-2015, 2:08 PM  Lesle Chris, OTR/L [phone] 07-23-15

## 2015-06-24 NOTE — Progress Notes (Signed)
Physical Therapy Treatment Patient Details Name: PAMM WROBLEWSKI MRN: [id number] DOB: 1941-03-23 Today's Date: 06/24/2015    History of Present Illness Pt is a 75 year old female s/p L TKA with hx of R TKA    PT Comments    Pt with issues with emesis this morning so mobility limited however able to perform LE exercises in recliner.  Follow Up Recommendations  Home health PT     Equipment Recommendations  None recommended by PT    Recommendations for Other Services       Precautions / Restrictions Precautions Precautions: Fall Required Braces or Orthoses: Knee Immobilizer - Left Knee Immobilizer - Left: Discontinue once straight leg raise with < 10 degree lag Restrictions Other Position/Activity Restrictions: WBAT    Mobility  Bed Mobility Overal bed mobility: Needs Assistance Bed Mobility: Supine to Sit     Supine to sit: Min assist;HOB elevated     General bed mobility comments: verbal cues for technique, assist for L LE  Transfers Overall transfer level: Needs assistance Equipment used: Rolling walker (2 wheeled) Transfers: Sit to/from [facility] Sit to Stand: Min assist Stand pivot transfers: Min assist       General transfer comment: assist to rise and steady, verbal cues for UE and LE positioning, only transferred to recliner this morning due to nausea/emesis earlier however also with emesis upon sitting in recliner  Ambulation/Gait                 Stairs            Wheelchair Mobility    Modified Rankin (Stroke Patients Only)       Balance                                    Cognition Arousal/Alertness: Awake/alert Behavior During Therapy: WFL for tasks assessed/performed Overall Cognitive Status: Within Functional Limits for tasks assessed                      Exercises Total Joint Exercises Ankle Circles/Pumps: AROM;Both;10 reps Quad Sets: 10 reps;AROM;Both Short Arc Quad: 10  reps;AAROM;Left Heel Slides: AAROM;Left;10 reps Hip ABduction/ADduction: AROM;Left;10 reps Straight Leg Raises: 10 reps;Left;AAROM Goniometric ROM: L knee AAROM 55*    General Comments        Pertinent Vitals/Pain Pain Assessment: 0-10 Pain Score: 5  Pain Location: L knee Pain Descriptors / Indicators: Sore;Aching Pain Intervention(s): Limited activity within patient's tolerance;Monitored during session;Premedicated before session;Repositioned;Ice applied    Home Living                      Prior Function            PT Goals (current goals can now be found in the care plan section) Progress towards PT goals: Progressing toward goals    Frequency  7X/week    PT Plan Current plan remains appropriate    Co-evaluation             End of Session Equipment Utilized During Treatment: Left knee immobilizer Activity Tolerance: Other (comment) (limited by emesis) Patient left: in chair;with call bell/phone within reach     Time: 0914-0940 PT Time Calculation (min) (ACUTE ONLY): 26 min  Charges:  $Therapeutic Exercise: 8-22 mins $Therapeutic Activity: 8-22 mins  G Codes:      Jermie Hippe,KATHrine E 07-24-2015, 12:40 PM Carmelia Bake, PT, DPT 2015-07-24 Pager: [phone]

## 2015-06-25 LAB — CBC
HCT: 35.2 % — ABNORMAL LOW (ref 36.0–46.0)
Hemoglobin: 11.7 g/dL — ABNORMAL LOW (ref 12.0–15.0)
MCH: 31.5 pg (ref 26.0–34.0)
MCHC: 33.2 g/dL (ref 30.0–36.0)
MCV: 94.6 fL (ref 78.0–100.0)
Platelets: 206 10*3/uL (ref 150–400)
RBC: 3.72 MIL/uL — ABNORMAL LOW (ref 3.87–5.11)
RDW: 13.5 % (ref 11.5–15.5)
WBC: 9.4 10*3/uL (ref 4.0–10.5)

## 2015-06-25 LAB — BASIC METABOLIC PANEL
Anion gap: 8 (ref 5–15)
BUN: 8 mg/dL (ref 6–20)
CO2: 28 mmol/L (ref 22–32)
Calcium: 9.4 mg/dL (ref 8.9–10.3)
Chloride: 102 mmol/L (ref 101–111)
Creatinine, Ser: 0.61 mg/dL (ref 0.44–1.00)
GFR calc Af Amer: >60 mL/min (ref >60)
GFR calc non Af Amer: >60 mL/min (ref >60)
Glucose, Bld: 116 mg/dL — ABNORMAL HIGH (ref 65–99)
Potassium: 3.6 mmol/L (ref 3.5–5.1)
Sodium: 138 mmol/L (ref 135–145)

## 2015-06-25 MED ORDER — RIVAROXABAN 10 MG PO TABS: 10.0000 mg | ORAL_TABLET | Freq: Every day | ORAL | Status: DC

## 2015-06-25 MED ORDER — ONDANSETRON HCL 4 MG PO TABS: 4.0000 mg | ORAL_TABLET | Freq: Four times a day (QID) | ORAL | Status: DC | PRN

## 2015-06-25 MED ORDER — TRAMADOL HCL 50 MG PO TABS: 50.0000 mg | ORAL_TABLET | Freq: Four times a day (QID) | ORAL | Status: DC | PRN

## 2015-06-25 MED ORDER — OXYCODONE HCL 5 MG PO TABS: 5.0000 mg | ORAL_TABLET | ORAL | Status: DC | PRN

## 2015-06-25 MED ORDER — METHOCARBAMOL 500 MG PO TABS: 500.0000 mg | ORAL_TABLET | Freq: Four times a day (QID) | ORAL | Status: DC | PRN

## 2015-06-25 NOTE — Progress Notes (Signed)
Physical Therapy Treatment Patient Details Name: Kimberly [name] MRN: [id number] DOB: Aug 17, 1940 Today's Date: 06/25/2015    History of Present Illness Pt is a 75 year old female s/p L TKA with hx of R TKA    PT Comments    Pt ambulated in hallway, practiced safe step technique and performed LE exercises.  Pt and spouse had no further questions and pt feels ready to d/c home today.  Follow Up Recommendations  Home health PT     Equipment Recommendations  None recommended by PT    Recommendations for Other Services       Precautions / Restrictions Precautions Precautions: Fall Required Braces or Orthoses: Knee Immobilizer - Left Knee Immobilizer - Left: Discontinue once straight leg raise with < 10 degree lag Restrictions Other Position/Activity Restrictions: WBAT    Mobility  Bed Mobility Overal bed mobility: Needs Assistance Bed Mobility: Supine to Sit     Supine to sit: Supervision     General bed mobility comments: pt able to self assist L LE  Transfers Overall transfer level: Needs assistance Equipment used: Rolling walker (2 wheeled) Transfers: Sit to/from Stand Sit to Stand: Min guard         General transfer comment: verbal cues for UE and LE positioning  Ambulation/Gait Ambulation/Gait assistance: Min guard Ambulation Distance (Feet): 40 Feet Assistive device: Rolling walker (2 wheeled) Gait Pattern/deviations: Step-to pattern;Antalgic;Decreased stance time - left     General Gait Details: verbal cues for sequence, RW positioning, step length, distance limited to conserve energy   Stairs Stairs: Yes Stairs assistance: Min guard Stair Management: Step to pattern;Backwards;Forwards;With walker Number of Stairs: 1 General stair comments: performed one step with RW forwards and backwards, spouse arrived during this and observed pt perform 2 times (performed once forward and twice backwards), verbal cues for safety, technique, RW  positioning  Wheelchair Mobility    Modified Rankin (Stroke Patients Only)       Balance                                    Cognition Arousal/Alertness: Awake/alert Behavior During Therapy: WFL for tasks assessed/performed Overall Cognitive Status: Within Functional Limits for tasks assessed                      Exercises Total Joint Exercises Ankle Circles/Pumps: AROM;Both;10 reps Quad Sets: 10 reps;AROM;Both Short Arc Quad: 10 reps;AAROM;Left Heel Slides: AAROM;Left;10 reps Hip ABduction/ADduction: AROM;Left;10 reps Straight Leg Raises: 10 reps;Left;AAROM    General Comments        Pertinent Vitals/Pain Pain Assessment: 0-10 Pain Score: 5  Pain Location: L knee Pain Descriptors / Indicators: Sore Pain Intervention(s): Limited activity within patient's tolerance;Monitored during session;Premedicated before session;Repositioned;Ice applied    Home Living                      Prior Function            PT Goals (current goals can now be found in the care plan section) Progress towards PT goals: Progressing toward goals    Frequency  7X/week    PT Plan Current plan remains appropriate    Co-evaluation             End of Session Equipment Utilized During Treatment: Gait belt;Left knee immobilizer Activity Tolerance: Patient limited by fatigue Patient left: with call bell/phone within reach;with family/visitor present;in chair  Time: [id number] PT Time Calculation (min) (ACUTE ONLY): 25 min  Charges:  $Gait Training: 8-22 mins $Therapeutic Exercise: 8-22 mins                    G Codes:      [place] Schoene,KATHrine E July 07, 2015, 3:30 PM Carmelia Bake, PT, DPT 07-07-15 Pager: [phone]

## 2015-06-25 NOTE — Progress Notes (Signed)
Subjective: 2 Days Post-Op Procedure(s) (LRB): LEFT TOTAL KNEE ARTHROPLASTY (Left) Patient reports pain as mild.   Patient seen in rounds by Dr. Wynelle Link. Patient is doing better this morning. Patient is ready to go home and will send home with Zofran Rx.  Objective: Vital signs in last 24 hours: Temp:  [97.7 F (36.5 C)-98.8 F (37.1 C)] 98.8 F (37.1 C) (01/25 0532) Pulse Rate:  [86-94] 87 (01/25 0532) Resp:  [16-18] 16 (01/25 0532) BP: (112-137)/(44-84) 132/76 mmHg (01/25 0532) SpO2:  [96 %-98 %] 98 % (01/25 0532)  Intake/Output from previous day:  Intake/Output Summary (Last 24 hours) at 06/25/15 0711 Last data filed at 06/25/15 0600  Gross per 24 hour  Intake 1806.25 ml  Output   3300 ml  Net -1493.75 ml    Labs:  Recent Labs  06/24/15 0448 06/25/15 0423  HGB 11.3* 11.7*    Recent Labs  06/24/15 0448 06/25/15 0423  WBC 8.8 9.4  RBC 3.59* 3.72*  HCT 34.0* 35.2*  PLT 193 206    Recent Labs  06/24/15 0448 06/25/15 0423  NA 136 138  K 3.2* 3.6  CL 104 102  CO2 25 28  BUN 11 8  CREATININE 0.63 0.61  GLUCOSE 110* 116*  CALCIUM 8.9 9.4   No results for input(s): LABPT, INR in the last 72 hours.  EXAM: General - Patient is Alert, Appropriate and Oriented Extremity - Neurovascular intact Sensation intact distally Dorsiflexion/Plantar flexion intact Incision - clean, dry, no drainage Motor Function - intact, moving foot and toes well on exam.   Assessment/Plan: 2 Days Post-Op Procedure(s) (LRB): LEFT TOTAL KNEE ARTHROPLASTY (Left) Procedure(s) (LRB): LEFT TOTAL KNEE ARTHROPLASTY (Left) Past Medical History  Diagnosis Date  . Cataracts, bilateral   . Degenerative disc disease   . Bunion   . Ankle fracture     Stress fracture  . 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 IBS (irritable bowel syndrome)   . Depression   . Anxiety   . GERD (gastroesophageal reflux disease)   . Diverticulosis of colon (without mention of hemorrhage) 2010    Colonoscopy    . Stricture and stenosis of esophagus 2005,2010    EGD   . Anal polyp 1998    Flex Sig   . Internal hemorrhoids without mention of complication 0000000    Colonoscopy   . External hemorrhoids 2000    Colonoscopy  . Family history of malignant neoplasm of gastrointestinal tract   . Hyperlipemia   . Fibromyalgia   . Hiatal hernia 2005,2010    EGD  . Hypothyroidism   . Allergy   . Anemia   . Asthma     border line has inhaler  . Stroke ([place])   . Irregular heart beat   . Pseudophakia, both eyes   . Angular blepharitis of left eye   . Retinal scar     left  . PCO (posterior capsular opacification)     left  . Dry eyes     bilateral  . Corneal scar     left eye  . PVD (posterior vitreous detachment) right  . Rotator cuff disorder     pain, left shoulder  . Migraine   . Anxiety   . IBS (irritable bowel syndrome)   . Pancreatitis   . Osteopenia 10/2013    T score -1.6 FRAX 10%/1.6%  . OSA (obstructive sleep apnea) 05/14/2015  . Shingles   . PONV (postoperative nausea and vomiting)   . Wears glasses   .  Vertigo   . History of bronchitis   . Pneumonia     childhood illness  . Varicose veins   . Ulcerative colitis ([place])   . History of urinary tract infection   . History of measles   . History of mumps   . Menopause   . History of strep sore throat   . Chills (without fever)   . Rash     on back   . Itching   . Imbalance    Principal Problem:   OA (osteoarthritis) of knee  Estimated body mass index is 29.12 kg/(m^2) as calculated from the following:   Height as of this encounter: 5\' 7"  (1.702 m).   Weight as of this encounter: 84.369 kg (186 lb). Up with therapy Discharge home with home health Diet - Cardiac diet Follow up - in 2 weeks Activity - WBAT Disposition - Home Condition Upon Discharge - Good D/C Meds - See DC Summary DVT Prophylaxis - Xarelto  Arlee Muslim, PA-C Orthopaedic Surgery 06/25/2015, 7:11 AM

## 2015-06-25 NOTE — Progress Notes (Signed)
Occupational Therapy Treatment Patient Details Name: Kimberly [name] MRN: [id number] DOB: December 31, 1940 Today's Date: 06/25/2015    History of present illness Pt is a 75 year old female s/p L TKA with hx of R TKA   OT comments  All education completed. No further OT is needed at this time.  Follow Up Recommendations  No OT follow up;Supervision/Assistance - 24 hour    Equipment Recommendations  None recommended by OT    Recommendations for Other Services      Precautions / Restrictions Precautions Precautions: Fall Required Braces or Orthoses: Knee Immobilizer - Left Knee Immobilizer - Left: Discontinue once straight leg raise with < 10 degree lag Restrictions Weight Bearing Restrictions: No Other Position/Activity Restrictions: WBAT       Mobility Bed Mobility               General bed mobility comments: oob  Transfers     Transfers: Sit to/from Stand Sit to Stand: Supervision         General transfer comment: vcs for LE placement    Balance                                   ADL                           Toilet Transfer: Min guard;Ambulation;BSC;RW;Minimal assistance (min A to prop foot up when sitting on BSC)   Toileting- Clothing Manipulation and Hygiene: Set up;Sitting/lateral lean   Tub/ Shower Transfer: Walk-in shower;Min guard;Ambulation (cues for pushing through arms when stepping over ledge)     General ADL Comments: practiced shower transfer.  Pt doesn't think husband will be able to assist with KI.  She plans to sponge bathe until she doesn't need this any more. Educated on leg lifter to assist with leg when scooting back in chair. Also showed husband how he can support legs with brace to assist. Pt does not have any other questions/concerns      Vision                     Perception     Praxis      Cognition   Behavior During Therapy: [facility] for tasks assessed/performed Overall Cognitive Status:  Within Functional Limits for tasks assessed                       Extremity/Trunk Assessment               Exercises     Shoulder Instructions       General Comments      Pertinent Vitals/ Pain       Pain Score: 5  Pain Location: L knee Pain Descriptors / Indicators: Sore Pain Intervention(s): Limited activity within patient's tolerance;Monitored during session;Premedicated before session;Repositioned  Home Living                                          Prior Functioning/Environment              Frequency       Progress Toward Goals  OT Goals(current goals can now be found in the care plan section)  Progress towards OT goals: Goals met/education completed, patient discharged from OT (close to  toilet goal; does not need further OT)     Plan      Co-evaluation                 End of Session CPM Left Knee CPM Left Knee: Off   Activity Tolerance Patient tolerated treatment well   Patient Left in chair;with call bell/phone within reach;with family/visitor present   Nurse Communication          Time: 6742-5525 OT Time Calculation (min): 10 min  Charges: OT General Charges $OT Visit: 1 Procedure OT Treatments $Self Care/Home Management : 8-22 mins  [name] 06/25/2015, 11:33 AM  Lesle Chris, OTR/L [phone] 06/25/2015

## 2015-06-25 NOTE — Discharge Summary (Signed)
Physician Discharge Summary   Patient ID: Kimberly [name] MRN: [id number] DOB/AGE: 1941/05/06 75 y.o.  Admit date: 06/23/2015 Discharge date:  06-25-15  Primary Diagnosis:  Osteoarthritis Left knee(s)  Admission Diagnoses:  Past Medical History  Diagnosis Date  . Cataracts, bilateral   . Degenerative disc disease   . Bunion   . Ankle fracture     Stress fracture  . 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 IBS (irritable bowel syndrome)   . Depression   . Anxiety   . GERD (gastroesophageal reflux disease)   . Diverticulosis of colon (without mention of hemorrhage) 2010    Colonoscopy  . Stricture and stenosis of esophagus 2005,2010    EGD   . Anal polyp 1998    Flex Sig   . Internal hemorrhoids without mention of complication 1191,4782    Colonoscopy   . External hemorrhoids 2000    Colonoscopy  . Family history of malignant neoplasm of gastrointestinal tract   . Hyperlipemia   . Fibromyalgia   . Hiatal hernia 2005,2010    EGD  . Hypothyroidism   . Allergy   . Anemia   . Asthma     border line has inhaler  . Stroke ([place])   . Irregular heart beat   . Pseudophakia, both eyes   . Angular blepharitis of left eye   . Retinal scar     left  . PCO (posterior capsular opacification)     left  . Dry eyes     bilateral  . Corneal scar     left eye  . PVD (posterior vitreous detachment) right  . Rotator cuff disorder     pain, left shoulder  . Migraine   . Anxiety   . IBS (irritable bowel syndrome)   . Pancreatitis   . Osteopenia 10/2013    T score -1.6 FRAX 10%/1.6%  . OSA (obstructive sleep apnea) 05/14/2015  . Shingles   . PONV (postoperative nausea and vomiting)   . Wears glasses   . Vertigo   . History of bronchitis   . Pneumonia     childhood illness  . Varicose veins   . Ulcerative colitis ([place])   . History of urinary tract infection   . History of measles   . History of mumps   . Menopause   . History of strep sore throat   . Chills (without fever)   . Rash      on back   . Itching   . Imbalance    Discharge Diagnoses:   Principal Problem:   OA (osteoarthritis) of knee  Estimated body mass index is 29.12 kg/(m^2) as calculated from the following:   Height as of this encounter: '5\' 7"'$  (1.702 m).   Weight as of this encounter: 84.369 kg (186 lb).  Procedure:  Procedure(s) (LRB): LEFT TOTAL KNEE ARTHROPLASTY (Left)   Consults: None  HPI: Kimberly [name] is a 75 y.o. year old female with end stage OA of her left knee with progressively worsening pain and dysfunction. She has constant pain, with activity and at rest and significant functional deficits with difficulties even with ADLs. She has had extensive non-op management including analgesics, injections of cortisone, and home exercise program, but remains in significant pain with significant dysfunction. Radiographs show bone on bone arthritis lateral and patellofemoral. She presents now for left Total Knee Arthroplasty.   Laboratory Data: Admission on 06/23/2015  Component Date Value Ref Range Status  . WBC 06/24/2015 8.8  4.0 -  10.5 K/uL Final  . RBC 06/24/2015 3.59* 3.87 - 5.11 MIL/uL Final  . Hemoglobin 06/24/2015 11.3* 12.0 - 15.0 g/dL Final  . HCT 06/24/2015 34.0* 36.0 - 46.0 % Final  . MCV 06/24/2015 94.7  78.0 - 100.0 fL Final  . MCH 06/24/2015 31.5  26.0 - 34.0 pg Final  . MCHC 06/24/2015 33.2  30.0 - 36.0 g/dL Final  . RDW 06/24/2015 13.4  11.5 - 15.5 % Final  . Platelets 06/24/2015 193  150 - 400 K/uL Final  . Sodium 06/24/2015 136  135 - 145 mmol/L Final  . Potassium 06/24/2015 3.2* 3.5 - 5.1 mmol/L Final  . Chloride 06/24/2015 104  101 - 111 mmol/L Final  . CO2 06/24/2015 25  22 - 32 mmol/L Final  . Glucose, Bld 06/24/2015 110* 65 - 99 mg/dL Final  . BUN 06/24/2015 11  6 - 20 mg/dL Final  . Creatinine, Ser 06/24/2015 0.63  0.44 - 1.00 mg/dL Final  . Calcium 06/24/2015 8.9  8.9 - 10.3 mg/dL Final  . GFR calc non Af Amer 06/24/2015 >60  >60 mL/min Final  . GFR calc Af  Amer 06/24/2015 >60  >60 mL/min Final   Comment: (NOTE) The eGFR has been calculated using the CKD EPI equation. This calculation has not been validated in all clinical situations. eGFR's persistently <60 mL/min signify possible Chronic Kidney Disease.   . Anion gap 06/24/2015 7  5 - 15 Final  . WBC 06/25/2015 9.4  4.0 - 10.5 K/uL Final  . RBC 06/25/2015 3.72* 3.87 - 5.11 MIL/uL Final  . Hemoglobin 06/25/2015 11.7* 12.0 - 15.0 g/dL Final  . HCT 06/25/2015 35.2* 36.0 - 46.0 % Final  . MCV 06/25/2015 94.6  78.0 - 100.0 fL Final  . MCH 06/25/2015 31.5  26.0 - 34.0 pg Final  . MCHC 06/25/2015 33.2  30.0 - 36.0 g/dL Final  . RDW 06/25/2015 13.5  11.5 - 15.5 % Final  . Platelets 06/25/2015 206  150 - 400 K/uL Final  . Sodium 06/25/2015 138  135 - 145 mmol/L Final  . Potassium 06/25/2015 3.6  3.5 - 5.1 mmol/L Final  . Chloride 06/25/2015 102  101 - 111 mmol/L Final  . CO2 06/25/2015 28  22 - 32 mmol/L Final  . Glucose, Bld 06/25/2015 116* 65 - 99 mg/dL Final  . BUN 06/25/2015 8  6 - 20 mg/dL Final  . Creatinine, Ser 06/25/2015 0.61  0.44 - 1.00 mg/dL Final  . Calcium 06/25/2015 9.4  8.9 - 10.3 mg/dL Final  . GFR calc non Af Amer 06/25/2015 >60  >60 mL/min Final  . GFR calc Af Amer 06/25/2015 >60  >60 mL/min Final   Comment: (NOTE) The eGFR has been calculated using the CKD EPI equation. This calculation has not been validated in all clinical situations. eGFR's persistently <60 mL/min signify possible Chronic Kidney Disease.   Georgiann Hahn gap 06/25/2015 8  5 - 15 Final  Hospital Outpatient Visit on 06/17/2015  Component Date Value Ref Range Status  . MRSA, PCR 06/17/2015 NEGATIVE  NEGATIVE Final  . Staphylococcus aureus 06/17/2015 POSITIVE* NEGATIVE Final   Comment:        The Xpert SA Assay (FDA approved for NASAL specimens in patients over 68 years of age), is one component of a comprehensive surveillance program.  Test performance has been validated by [facility] for patients  greater than or equal to 41 year old. It is not intended to diagnose infection nor to guide or monitor treatment.   Marland Kitchen  aPTT 06/17/2015 31  24 - 37 seconds Final  . WBC 06/17/2015 5.7  4.0 - 10.5 K/uL Final  . RBC 06/17/2015 4.25  3.87 - 5.11 MIL/uL Final  . Hemoglobin 06/17/2015 13.3  12.0 - 15.0 g/dL Final  . HCT 06/17/2015 40.0  36.0 - 46.0 % Final  . MCV 06/17/2015 94.1  78.0 - 100.0 fL Final  . MCH 06/17/2015 31.3  26.0 - 34.0 pg Final  . MCHC 06/17/2015 33.3  30.0 - 36.0 g/dL Final  . RDW 06/17/2015 13.2  11.5 - 15.5 % Final  . Platelets 06/17/2015 218  150 - 400 K/uL Final  . Sodium 06/17/2015 143  135 - 145 mmol/L Final  . Potassium 06/17/2015 4.8  3.5 - 5.1 mmol/L Final  . Chloride 06/17/2015 106  101 - 111 mmol/L Final  . CO2 06/17/2015 28  22 - 32 mmol/L Final  . Glucose, Bld 06/17/2015 93  65 - 99 mg/dL Final  . BUN 06/17/2015 14  6 - 20 mg/dL Final  . Creatinine, Ser 06/17/2015 0.67  0.44 - 1.00 mg/dL Final  . Calcium 06/17/2015 10.3  8.9 - 10.3 mg/dL Final  . Total Protein 06/17/2015 7.5  6.5 - 8.1 g/dL Final  . Albumin 06/17/2015 4.4  3.5 - 5.0 g/dL Final  . AST 06/17/2015 26  15 - 41 U/L Final  . ALT 06/17/2015 19  14 - 54 U/L Final  . Alkaline Phosphatase 06/17/2015 52  38 - 126 U/L Final  . Total Bilirubin 06/17/2015 0.4  0.3 - 1.2 mg/dL Final  . GFR calc non Af Amer 06/17/2015 >60  >60 mL/min Final  . GFR calc Af Amer 06/17/2015 >60  >60 mL/min Final   Comment: (NOTE) The eGFR has been calculated using the CKD EPI equation. This calculation has not been validated in all clinical situations. eGFR's persistently <60 mL/min signify possible Chronic Kidney Disease.   . Anion gap 06/17/2015 9  5 - 15 Final  . Prothrombin Time 06/17/2015 14.2  11.6 - 15.2 seconds Final  . INR 06/17/2015 1.08  0.00 - 1.49 Final  . ABO/RH(D) 06/17/2015 A POS   Final  . Antibody Screen 06/17/2015 NEG   Final  . Sample Expiration 06/17/2015 06/26/2015   Final  . Extend sample reason  06/17/2015 NO TRANSFUSIONS OR PREGNANCY IN THE PAST 3 MONTHS   Final  . Color, Urine 06/17/2015 YELLOW  YELLOW Final  . APPearance 06/17/2015 CLEAR  CLEAR Final  . Specific Gravity, Urine 06/17/2015 1.004* 1.005 - 1.030 Final  . pH 06/17/2015 7.5  5.0 - 8.0 Final  . Glucose, UA 06/17/2015 NEGATIVE  NEGATIVE mg/dL Final  . Hgb urine dipstick 06/17/2015 TRACE* NEGATIVE Final  . Bilirubin Urine 06/17/2015 NEGATIVE  NEGATIVE Final  . Ketones, ur 06/17/2015 NEGATIVE  NEGATIVE mg/dL Final  . Protein, ur 06/17/2015 NEGATIVE  NEGATIVE mg/dL Final  . Nitrite 06/17/2015 NEGATIVE  NEGATIVE Final  . Leukocytes, UA 06/17/2015 NEGATIVE  NEGATIVE Final  . ABO/RH(D) 06/17/2015 A POS   Final  . Squamous Epithelial / LPF 06/17/2015 0-5* NONE SEEN Final  . WBC, UA 06/17/2015 0-5  0 - 5 WBC/hpf Final  . RBC / HPF 06/17/2015 0-5  0 - 5 RBC/hpf Final  . Bacteria, UA 06/17/2015 RARE* NONE SEEN Final  Office Visit on 06/03/2015  Component Date Value Ref Range Status  . Yeast Wet Prep HPF POC 06/03/2015 NONE SEEN  NONE SEEN Final  . Trich, Wet Prep 06/03/2015 NONE SEEN  NONE SEEN Final  .  Clue Cells Wet Prep HPF POC 06/03/2015 NONE SEEN  NONE SEEN Final  . WBC, Wet Prep HPF POC 06/03/2015 FEW  NONE SEEN Final   Comment: FEW BACTERIA SEEN (7-12) EPITH. CELLS PER HPF   . Color, Urine 06/03/2015 YELLOW  YELLOW Final   Comment: ** Please note change in unit of measure and reference range(s). **     . APPearance 06/03/2015 CLEAR  CLEAR Final  . Specific Gravity, Urine 06/03/2015 1.010  1.001 - 1.035 Final  . pH 06/03/2015 7.0  5.0 - 8.0 Final  . Glucose, UA 06/03/2015 NEGATIVE  NEGATIVE Final  . Bilirubin Urine 06/03/2015 NEGATIVE  NEGATIVE Final  . Ketones, ur 06/03/2015 NEGATIVE  NEGATIVE Final  . Hgb urine dipstick 06/03/2015 1+* NEGATIVE Final  . Protein, ur 06/03/2015 NEGATIVE  NEGATIVE Final  . Nitrite 06/03/2015 NEGATIVE  NEGATIVE Final  . Leukocytes, UA 06/03/2015 NEGATIVE  NEGATIVE Final  . WBC,  UA 06/03/2015 0-5  <=5 WBC/HPF Final  . RBC / HPF 06/03/2015 3-10* <=2 RBC/HPF Final  . Squamous Epithelial / LPF 06/03/2015 0-5  <=5 HPF Final  . Bacteria, UA 06/03/2015 FEW* NONE SEEN HPF Final  . Crystals 06/03/2015 NONE SEEN  NONE SEEN HPF Final  . Casts 06/03/2015 NONE SEEN  NONE SEEN LPF Final  . Yeast 06/03/2015 NONE SEEN  NONE SEEN HPF Final  . Urine-Other 06/03/2015 SEE NOTE   Final   *URINE CULTURE PENDING*  . Colony Count 06/03/2015 25,000 COLONIES/ML   Final  . Organism ID, Bacteria 06/03/2015 Multiple bacterial morphotypes present, none   Final  . Organism ID, Bacteria 06/03/2015 predominant. Suggest appropriate recollection if    Final  . Organism ID, Bacteria 06/03/2015 clinically indicated.   Final     X-Rays:No results found.  EKG: Orders placed or performed in visit on 01/08/15  . EKG 12-Lead     Hospital Course: NAO LINZ is a 75 y.o. who was admitted to [facility]. They were brought to the operating room on 06/23/2015 and underwent Procedure(s): LEFT TOTAL KNEE ARTHROPLASTY.  Patient tolerated the procedure well and was later transferred to the recovery room and then to the orthopaedic floor for postoperative care.  They were given PO and IV analgesics for pain control following their surgery.  They were given 24 hours of postoperative antibiotics of  Anti-infectives    Start     Dose/Rate Route Frequency Ordered Stop   06/23/15 2200  valACYclovir (VALTREX) tablet 500 mg  Status:  Discontinued     500 mg Oral 2 times daily 06/23/15 1043 06/24/15 0736   06/23/15 1300  ceFAZolin (ANCEF) IVPB 2 g/50 mL premix     2 g 100 mL/hr over 30 Minutes Intravenous Every 6 hours 06/23/15 1043 06/23/15 1812   06/23/15 0509  ceFAZolin (ANCEF) IVPB 2 g/50 mL premix     2 g 100 mL/hr over 30 Minutes Intravenous On call to O.R. 06/23/15 1962 06/23/15 0723     and started on DVT prophylaxis in the form of Xarelto.   PT and OT were ordered for total joint protocol.   Discharge planning consulted to help with postop disposition and equipment needs.  Patient had a tough night on the evening of surgery.  Main issue wass nausea. Tried Dilaudid but did not work as well. Went back to Oxy and added antiemetic.  They started to get up OOB with therapy on day one. Hemovac drain was pulled without difficulty.  Continued to work with therapy  into day two.  Dressing was changed on day two and the incision was healing well.  Patient was seen in rounds on POD 2 , feeling better, and was ready to go home.  Discharge home with home health Diet - Cardiac diet Follow up - in 2 weeks Activity - WBAT Disposition - Home Condition Upon Discharge - Good D/C Meds - See DC Summary DVT Prophylaxis - Xarelto  Discharge Instructions    Call MD / Call 911    Complete by:  As directed   If you experience chest pain or shortness of breath, CALL 911 and be transported to the hospital emergency room.  If you develope a fever above 101 F, pus (white drainage) or increased drainage or redness at the wound, or calf pain, call your surgeon's office.     Change dressing    Complete by:  As directed   Change dressing daily with sterile 4 x 4 inch gauze dressing and apply TED hose. Do not submerge the incision under water.     Constipation Prevention    Complete by:  As directed   Drink plenty of fluids.  Prune juice may be helpful.  You may use a stool softener, such as Colace (over the counter) 100 mg twice a day.  Use MiraLax (over the counter) for constipation as needed.     Diet - low sodium heart healthy    Complete by:  As directed      Discharge instructions    Complete by:  As directed   Pick up stool softner and laxative for home use following surgery while on pain medications. Do not submerge incision under water. Please use good hand washing techniques while changing dressing each day. May shower starting three days after surgery. Please use a clean towel to pat the incision  dry following showers. Continue to use ice for pain and swelling after surgery. Do not use any lotions or creams on the incision until instructed by your surgeon.  Take Xarelto for two and a half more weeks, then discontinue Xarelto. Once the patient has completed the Xarelto, they may resume the 81 mg Aspirin.  Postoperative Constipation Protocol  Constipation - defined medically as fewer than three stools per week and severe constipation as less than one stool per week.  One of the most common issues patients have following surgery is constipation.  Even if you have a regular bowel pattern at home, your normal regimen is likely to be disrupted due to multiple reasons following surgery.  Combination of anesthesia, postoperative narcotics, change in appetite and fluid intake all can affect your bowels.  In order to avoid complications following surgery, here are some recommendations in order to help you during your recovery period.  Colace (docusate) - Pick up an over-the-counter form of Colace or another stool softener and take twice a day as long as you are requiring postoperative pain medications.  Take with a full glass of water daily.  If you experience loose stools or diarrhea, hold the colace until you stool forms back up.  If your symptoms do not get better within 1 week or if they get worse, check with your doctor.  Dulcolax (bisacodyl) - Pick up over-the-counter and take as directed by the product packaging as needed to assist with the movement of your bowels.  Take with a full glass of water.  Use this product as needed if not relieved by Colace only.   MiraLax (polyethylene glycol) - Pick up over-the-counter  to have on hand.  MiraLax is a solution that will increase the amount of water in your bowels to assist with bowel movements.  Take as directed and can mix with a glass of water, juice, soda, coffee, or tea.  Take if you go more than two days without a movement. Do not use MiraLax  more than once per day. Call your doctor if you are still constipated or irregular after using this medication for 7 days in a row.  If you continue to have problems with postoperative constipation, please contact the office for further assistance and recommendations.  If you experience "the worst abdominal pain ever" or develop nausea or vomiting, please contact the office immediatly for further recommendations for treatment.     Do not put a pillow under the knee. Place it under the heel.    Complete by:  As directed      Do not sit on low chairs, stoools or toilet seats, as it may be difficult to get up from low surfaces    Complete by:  As directed      Driving restrictions    Complete by:  As directed   No driving until released by the physician.     Increase activity slowly as tolerated    Complete by:  As directed      Lifting restrictions    Complete by:  As directed   No lifting until released by the physician.     Patient may shower    Complete by:  As directed   You may shower without a dressing once there is no drainage.  Do not wash over the wound.  If drainage remains, do not shower until drainage stops.     TED hose    Complete by:  As directed   Use stockings (TED hose) for 3 weeks on both leg(s).  You may remove them at night for sleeping.     Weight bearing as tolerated    Complete by:  As directed   Laterality:  left  Extremity:  Lower            Medication List    STOP taking these medications        ALIGN 4 MG Caps     aspirin 81 MG tablet     CALCIUM + D PO     carboxymethylcellulose 0.5 % Soln  Commonly known as:  REFRESH PLUS     HYLAFEM Supp     vitamin B-12 1000 MCG tablet  Commonly known as:  CYANOCOBALAMIN     Vitamin D 2000 units Caps      TAKE these medications        diphenhydrAMINE 25 mg capsule  Commonly known as:  BENADRYL  Take 25 mg by mouth every 6 (six) hours as needed for itching.     docusate sodium 100 MG capsule    Commonly known as:  COLACE  Take 100 mg by mouth daily as needed for mild constipation.     EPINEPHrine 0.3 mg/0.3 mL Soaj injection  Commonly known as:  EPI-PEN  Inject 0.3 mg into the muscle once. Reported on 05/28/2015     fluticasone 50 MCG/ACT nasal spray  Commonly known as:  FLONASE  Place 2 sprays into both nostrils every other day.     levothyroxine 112 MCG tablet  Commonly known as:  SYNTHROID, LEVOTHROID  Take 112 mcg by mouth daily before breakfast.     Linaclotide 145 MCG Caps capsule  Commonly known as:  LINZESS  Take 1 capsule (145 mcg total) by mouth daily.     methocarbamol 500 MG tablet  Commonly known as:  ROBAXIN  Take 1 tablet (500 mg total) by mouth every 6 (six) hours as needed for muscle spasms.     montelukast 10 MG tablet  Commonly known as:  SINGULAIR  Take 1 tablet by mouth daily.     omeprazole 20 MG capsule  Commonly known as:  PRILOSEC  TAKE 1 CAPSULE (20 MG TOTAL) BY MOUTH DAILY.     ondansetron 4 MG tablet  Commonly known as:  ZOFRAN  Take 1 tablet (4 mg total) by mouth every 6 (six) hours as needed for nausea.     oxyCODONE 5 MG immediate release tablet  Commonly known as:  Oxy IR/ROXICODONE  Take 1-2 tablets (5-10 mg total) by mouth every 3 (three) hours as needed for moderate pain or severe pain.     polyethylene glycol powder powder  Commonly known as:  GLYCOLAX/MIRALAX  Use 17 grams daily as needed.     PROAIR HFA 108 (90 Base) MCG/ACT inhaler  Generic drug:  albuterol  TAKE 2 PUFFS AS NEEDED EVERY 4-6 HOURS AS NEEDED COUGH/WHEEZE INHALATION 90     RESTASIS 0.05 % ophthalmic emulsion  Generic drug:  cycloSPORINE  Place 1 drop into both eyes 2 (two) times daily. Uses as directed     rivaroxaban 10 MG Tabs tablet  Commonly known as:  XARELTO  Take 1 tablet (10 mg total) by mouth daily with breakfast. Take Xarelto for two and a half more weeks, then discontinue Xarelto. Once the patient has completed the Xarelto, they may resume  the 81 mg Aspirin.     rosuvastatin 10 MG tablet  Commonly known as:  CRESTOR  TAKE ONE HALF TABLET ONCE A DAY AT BEDTIME ORALLY 90 DAY(S)     sodium chloride 0.65 % Soln nasal spray  Commonly known as:  OCEAN  Place 2 sprays into both nostrils as needed for congestion.     traMADol 50 MG tablet  Commonly known as:  ULTRAM  Take 1-2 tablets (50-100 mg total) by mouth every 6 (six) hours as needed (mild pain).     valACYclovir 500 MG tablet  Commonly known as:  VALTREX  Take 1 tablet (500 mg total) by mouth 2 (two) times daily.           Follow-up Information    Follow up with [facility].   Why:  home health physical therapy   Contact information:   [address] [place] [address] [phone]       Follow up with Gearlean Alf, MD On 07/08/2015.   Specialty:  Orthopedic Surgery   Why:  Call office at [phone] to setup appointment on Tuesday 07/08/2015 with Dr. Wynelle Link.   Contact information:   [address] [place] [address] [phone]       Signed: Arlee Muslim, PA-C Orthopaedic Surgery 06/25/2015, 7:22 AM

## 2015-06-25 NOTE — Progress Notes (Signed)
Occupational Therapy Treatment Patient Details Name: Kimberly [name] MRN: [id number] DOB: [birth year] Today's Date: 06/25/2015    History of present illness Pt is a 75 year old female s/p L TKA with hx of R TKA   OT comments  Pt nauseous at end of session; will try to return once more prior to d/c   Follow Up Recommendations  No OT follow up;Supervision/Assistance - 24 hour    Equipment Recommendations  None recommended by OT    Recommendations for Other Services      Precautions / Restrictions Precautions Precautions: Fall Required Braces or Orthoses: Knee Immobilizer - Left Knee Immobilizer - Left: Discontinue once straight leg raise with < 10 degree lag Restrictions Weight Bearing Restrictions: No Other Position/Activity Restrictions: WBAT       Mobility Bed Mobility               General bed mobility comments: oob  Transfers     Transfers: Sit to/from Stand Sit to Stand: Supervision         General transfer comment: vcs for LE placement    Balance                                   ADL                                         General ADL Comments: assisted pt with LB dressing; use of reacher for LB dressing.  Pt able to reach to don underwear without.  Assisted with pants as they were catching on socks.  Donned KI afterwards.  Pt states husband will not be able to bend down to don brace.  Suggested that in shower, she takes RW to stand and have him don/doff while she is standing.  Pt became nauseous, so did not practice shower transfer.  Handout given      Vision                     Perception     Praxis      Cognition   Behavior During Therapy: WFL for tasks assessed/performed Overall Cognitive Status: Within Functional Limits for tasks assessed                       Extremity/Trunk Assessment               Exercises     Shoulder Instructions       General Comments       Pertinent Vitals/ Pain       Pain Score: 5  Pain Location: L knee Pain Descriptors / Indicators: Aching Pain Intervention(s): Monitored during session;Limited activity within patient's tolerance;Premedicated before session;Repositioned  Home Living                                          Prior Functioning/Environment              Frequency       Progress Toward Goals  OT Goals(current goals can now be found in the care plan section)  Progress towards OT goals: Progressing toward goals     Plan      Co-evaluation  End of Session CPM Left Knee CPM Left Knee: Off   Activity Tolerance Treatment limited secondary to medical complications (Comment) (nausea)   Patient Left in chair;with call bell/phone within reach   Nurse Communication          Time: [phone] OT Time Calculation (min): 16 min  Charges: OT General Charges $OT Visit: 1 Procedure OT Treatments $Self Care/Home Management : 8-22 mins  [name] 06/25/2015, 10:30 AM   Lesle Chris, OTR/L [phone] 06/25/2015

## 2015-07-04 ENCOUNTER — Emergency Department (HOSPITAL_COMMUNITY)
Admission: EM | Admit: 2015-07-04 | Discharge: 2015-07-04 | Disposition: A | Discharge status: Home / Self Care | Payer: Medicare Other | Attending: Physician Assistant | Admitting: Physician Assistant

## 2015-07-04 ENCOUNTER — Encounter (HOSPITAL_COMMUNITY): Payer: Self-pay | Admitting: *Deleted

## 2015-07-04 DIAGNOSIS — Z8742 Personal history of other diseases of the female genital tract: Secondary | ICD-10-CM | POA: Insufficient documentation

## 2015-07-04 DIAGNOSIS — Z8669 Personal history of other diseases of the nervous system and sense organs: Secondary | ICD-10-CM | POA: Diagnosis not present

## 2015-07-04 DIAGNOSIS — Z96652 Presence of left artificial knee joint: Secondary | ICD-10-CM | POA: Insufficient documentation

## 2015-07-04 DIAGNOSIS — J45909 Unspecified asthma, uncomplicated: Secondary | ICD-10-CM | POA: Diagnosis not present

## 2015-07-04 DIAGNOSIS — Z973 Presence of spectacles and contact lenses: Secondary | ICD-10-CM | POA: Insufficient documentation

## 2015-07-04 DIAGNOSIS — E039 Hypothyroidism, unspecified: Secondary | ICD-10-CM | POA: Insufficient documentation

## 2015-07-04 DIAGNOSIS — Z8673 Personal history of transient ischemic attack (TIA), and cerebral infarction without residual deficits: Secondary | ICD-10-CM | POA: Diagnosis not present

## 2015-07-04 DIAGNOSIS — Z79899 Other long term (current) drug therapy: Secondary | ICD-10-CM | POA: Insufficient documentation

## 2015-07-04 DIAGNOSIS — K589 Irritable bowel syndrome without diarrhea: Secondary | ICD-10-CM | POA: Insufficient documentation

## 2015-07-04 DIAGNOSIS — Z87891 Personal history of nicotine dependence: Secondary | ICD-10-CM | POA: Insufficient documentation

## 2015-07-04 DIAGNOSIS — Z7951 Long term (current) use of inhaled steroids: Secondary | ICD-10-CM | POA: Diagnosis not present

## 2015-07-04 DIAGNOSIS — M797 Fibromyalgia: Secondary | ICD-10-CM | POA: Diagnosis not present

## 2015-07-04 DIAGNOSIS — Z8781 Personal history of (healed) traumatic fracture: Secondary | ICD-10-CM | POA: Diagnosis not present

## 2015-07-04 DIAGNOSIS — Z7901 Long term (current) use of anticoagulants: Secondary | ICD-10-CM | POA: Insufficient documentation

## 2015-07-04 DIAGNOSIS — E785 Hyperlipidemia, unspecified: Secondary | ICD-10-CM | POA: Insufficient documentation

## 2015-07-04 DIAGNOSIS — Z872 Personal history of diseases of the skin and subcutaneous tissue: Secondary | ICD-10-CM | POA: Insufficient documentation

## 2015-07-04 DIAGNOSIS — F329 Major depressive disorder, single episode, unspecified: Secondary | ICD-10-CM | POA: Insufficient documentation

## 2015-07-04 DIAGNOSIS — F419 Anxiety disorder, unspecified: Secondary | ICD-10-CM | POA: Insufficient documentation

## 2015-07-04 DIAGNOSIS — Z862 Personal history of diseases of the blood and blood-forming organs and certain disorders involving the immune mechanism: Secondary | ICD-10-CM | POA: Insufficient documentation

## 2015-07-04 DIAGNOSIS — Z87440 Personal history of urinary (tract) infections: Secondary | ICD-10-CM | POA: Insufficient documentation

## 2015-07-04 DIAGNOSIS — Z8701 Personal history of pneumonia (recurrent): Secondary | ICD-10-CM | POA: Diagnosis not present

## 2015-07-04 DIAGNOSIS — Z8679 Personal history of other diseases of the circulatory system: Secondary | ICD-10-CM | POA: Diagnosis not present

## 2015-07-04 DIAGNOSIS — R2242 Localized swelling, mass and lump, left lower limb: Secondary | ICD-10-CM | POA: Diagnosis not present

## 2015-07-04 DIAGNOSIS — Z8619 Personal history of other infectious and parasitic diseases: Secondary | ICD-10-CM | POA: Diagnosis not present

## 2015-07-04 DIAGNOSIS — K219 Gastro-esophageal reflux disease without esophagitis: Secondary | ICD-10-CM | POA: Diagnosis not present

## 2015-07-04 DIAGNOSIS — R609 Edema, unspecified: Secondary | ICD-10-CM

## 2015-07-04 LAB — CBC WITH DIFFERENTIAL/PLATELET
Basophils Absolute: 0 10*3/uL (ref 0.0–0.1)
Basophils Relative: 0 %
Eosinophils Absolute: 0.2 10*3/uL (ref 0.0–0.7)
Eosinophils Relative: 2 %
HCT: 32.6 % — ABNORMAL LOW (ref 36.0–46.0)
Hemoglobin: 10.9 g/dL — ABNORMAL LOW (ref 12.0–15.0)
Lymphocytes Relative: 24 %
Lymphs Abs: 2.3 10*3/uL (ref 0.7–4.0)
MCH: 30.6 pg (ref 26.0–34.0)
MCHC: 33.4 g/dL (ref 30.0–36.0)
MCV: 91.6 fL (ref 78.0–100.0)
Monocytes Absolute: 0.9 10*3/uL (ref 0.1–1.0)
Monocytes Relative: 10 %
Neutro Abs: 6 10*3/uL (ref 1.7–7.7)
Neutrophils Relative %: 64 %
Platelets: 401 10*3/uL — ABNORMAL HIGH (ref 150–400)
RBC: 3.56 MIL/uL — ABNORMAL LOW (ref 3.87–5.11)
RDW: 12.9 % (ref 11.5–15.5)
WBC: 9.4 10*3/uL (ref 4.0–10.5)

## 2015-07-04 LAB — COMPREHENSIVE METABOLIC PANEL
ALT: 10 U/L — ABNORMAL LOW (ref 14–54)
AST: 19 U/L (ref 15–41)
Albumin: 3.4 g/dL — ABNORMAL LOW (ref 3.5–5.0)
Alkaline Phosphatase: 49 U/L (ref 38–126)
Anion gap: 9 (ref 5–15)
BUN: 12 mg/dL (ref 6–20)
CO2: 24 mmol/L (ref 22–32)
Calcium: 9.1 mg/dL (ref 8.9–10.3)
Chloride: 102 mmol/L (ref 101–111)
Creatinine, Ser: 0.6 mg/dL (ref 0.44–1.00)
GFR calc Af Amer: >60 mL/min (ref >60)
GFR calc non Af Amer: >60 mL/min (ref >60)
Glucose, Bld: 109 mg/dL — ABNORMAL HIGH (ref 65–99)
Potassium: 3.9 mmol/L (ref 3.5–5.1)
Sodium: 135 mmol/L (ref 135–145)
Total Bilirubin: 0.5 mg/dL (ref 0.3–1.2)
Total Protein: 6.6 g/dL (ref 6.5–8.1)

## 2015-07-04 NOTE — ED Notes (Signed)
Pt states that the she had a left knee replacement on 1/23; pt states that it began to swell several days ago; pt states that the tech at the the Ortho office stated for pt to come for a doppler t rule out a blood clot; upon arrival they called the Tech at home and she advised that she forgot to place the order and to just be seen in the ER; pt states that she is to have a doppler of the left knee; pt c/o increase swelling and redness and pain behind knee

## 2015-07-04 NOTE — ED Provider Notes (Signed)
CSN: [id number]     Arrival date & time 07/04/15  1856 History   First MD Initiated Contact with Patient 07/04/15 1932     Chief Complaint  Patient presents with  . Leg Swelling     (Consider location/radiation/quality/duration/timing/severity/associated sxs/prior Treatment) HPI   Patient is a 75 year old female with IBS depression and anxiety presenting with swelling in her left knee. Patient had total knee replacement by Dr. Senaida Ores so on January 23. She's been seen by physical therapy home. Physical therapy was supposed to call in for an ultrasound of the left leg but had forgot to call. She showed up here in the emergency department. Unfortunately it is after the time that we do ultrasounds for DVT. Of note patient is already on Xarlto. The swelling is located to the knee not lower leg. She's got mild erythema. She reports she's had fever of 99.4 at home. Should she's had no other systemic symptoms.  Past Medical History  Diagnosis Date  . Cataracts, bilateral   . Degenerative disc disease   . Bunion   . Ankle fracture     Stress fracture  . 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 IBS (irritable bowel syndrome)   . Depression   . Anxiety   . GERD (gastroesophageal reflux disease)   . Diverticulosis of colon (without mention of hemorrhage) 2010    Colonoscopy  . Stricture and stenosis of esophagus 2005,2010    EGD   . Anal polyp 1998    Flex Sig   . Internal hemorrhoids without mention of complication 0000000    Colonoscopy   . External hemorrhoids 2000    Colonoscopy  . Family history of malignant neoplasm of gastrointestinal tract   . Hyperlipemia   . Fibromyalgia   . Hiatal hernia 2005,2010    EGD  . Hypothyroidism   . Allergy   . Anemia   . Asthma     border line has inhaler  . Stroke ([place])   . Irregular heart beat   . Pseudophakia, both eyes   . Angular blepharitis of left eye   . Retinal scar     left  . PCO (posterior capsular opacification)     left  . Dry eyes      bilateral  . Corneal scar     left eye  . PVD (posterior vitreous detachment) right  . Rotator cuff disorder     pain, left shoulder  . Migraine   . Anxiety   . IBS (irritable bowel syndrome)   . Pancreatitis   . Osteopenia 10/2013    T score -1.6 FRAX 10%/1.6%  . OSA (obstructive sleep apnea) 05/14/2015  . Shingles   . PONV (postoperative nausea and vomiting)   . Wears glasses   . Vertigo   . History of bronchitis   . Pneumonia     childhood illness  . Varicose veins   . Ulcerative colitis ([place])   . History of urinary tract infection   . History of measles   . History of mumps   . Menopause   . History of strep sore throat   . Chills (without fever)   . Rash     on back   . Itching   . Imbalance    Past Surgical History  Procedure Laterality Date  . Total abdominal hysterectomy w/ bilateral salpingoophorectomy  1991    TAH BSO  . Tonsillectomy    . Breast lumpectomy Left     Benign  . Replacement  total knee Right 2011  . Eye surgery Left   . Total knee arthroplasty      both  . Cataract extraction Bilateral 2013  . Nasal septum surgery    . Mouth surgery  11/13/11    Cyst removed from gum-benign   . Colonoscopy      polyp removed  . Nasal septum surgery  1970's  . Esophageal dilation    . Bunionectomy  2014  . Anal fissure repair    . Foot surgery      left   . Hemorrhoid surgery    . Abdominal hysterectomy    . Total hip arthroplasty      right   . Total knee arthroplasty Left 06/23/2015    Procedure: LEFT TOTAL KNEE ARTHROPLASTY;  Surgeon: Gaynelle Arabian, MD;  Location: [facility];  Service: Orthopedics;  Laterality: Left;   Family History  Problem Relation Age of Onset  . Dementia Mother   . Irritable bowel syndrome Mother   . Hypertension Father   . Congestive Heart Failure Father   . Diabetes Sister   . Colon cancer Paternal Aunt   . Esophageal cancer Neg Hx   . Rectal cancer Neg Hx   . Stomach cancer Neg Hx   . Heart disease Maternal  Grandmother   . Alcohol abuse Father   . Alzheimer's disease Mother   . Heart attack Paternal Grandfather   . Heart attack Maternal Grandmother   . Heart attack Maternal Grandfather   . Arthritis Brother   . Other Sister     pituitary disease  . Allergies Sister   . Other Sister     joint problems  . Stroke Mother    Social History  Substance Use Topics  . Smoking status: Former Smoker -- 0.25 packs/day for 15 years    Types: Cigarettes    Quit date: 06/01/1975  . Smokeless tobacco: Never Used     Comment: Stopped smoking age 5  . Alcohol Use: No   OB History    Gravida Para Term Preterm AB TAB SAB Ectopic Multiple Living   0              Review of Systems  Constitutional: Negative for activity change and fatigue.  Eyes: Negative for discharge.  Respiratory: Negative for cough.   Cardiovascular: Positive for leg swelling. Negative for chest pain and palpitations.  Gastrointestinal: Negative for abdominal distention.  Genitourinary: Negative for dysuria.  Musculoskeletal: Positive for joint swelling.  Skin: Negative for rash.      Allergies  Azithromycin; Erythromycin; Almond oil; Atorvastatin calcium; Augmentin; Codeine; Ketoconazole; Morphine sulfate; Shellfish allergy; Simvastatin; Sulfamethoxazole-trimethoprim; and Clindamycin/lincomycin  Home Medications   Prior to Admission medications   Medication Sig Start Date End Date Taking? Authorizing Provider  diphenhydrAMINE (BENADRYL) 25 mg capsule Take 25 mg by mouth every 6 (six) hours as needed for itching.    Historical Provider, MD  docusate sodium (COLACE) 100 MG capsule Take 100 mg by mouth daily as needed for mild constipation.    Historical Provider, MD  EPINEPHrine 0.3 mg/0.3 mL IJ SOAJ injection Inject 0.3 mg into the muscle once. Reported on 05/28/2015    Historical Provider, MD  fluticasone (FLONASE) 50 MCG/ACT nasal spray Place 2 sprays into both nostrils every other day.  06/06/13   Historical Provider,  MD  levothyroxine (SYNTHROID, LEVOTHROID) 112 MCG tablet Take 112 mcg by mouth daily before breakfast.    Historical Provider, MD  Linaclotide Rolan Lipa) 145 MCG CAPS capsule  Take 1 capsule (145 mcg total) by mouth daily. Patient taking differently: Take 145 mcg by mouth daily as needed (for irritable bowel symptoms).  11/26/14   Lafayette Dragon, MD  methocarbamol (ROBAXIN) 500 MG tablet Take 1 tablet (500 mg total) by mouth every 6 (six) hours as needed for muscle spasms. 06/25/15   Arlee Muslim, PA-C  montelukast (SINGULAIR) 10 MG tablet Take 1 tablet by mouth daily.  04/09/15   Historical Provider, MD  omeprazole (PRILOSEC) 20 MG capsule TAKE 1 CAPSULE (20 MG TOTAL) BY MOUTH DAILY. Patient taking differently: TAKE 1 CAPSULE (20 MG TOTAL) BY MOUTH EVERY OTHER DAY 07/03/14   Lafayette Dragon, MD  ondansetron (ZOFRAN) 4 MG tablet Take 1 tablet (4 mg total) by mouth every 6 (six) hours as needed for nausea. 06/25/15   Arlee Muslim, PA-C  oxyCODONE (OXY IR/ROXICODONE) 5 MG immediate release tablet Take 1-2 tablets (5-10 mg total) by mouth every 3 (three) hours as needed for moderate pain or severe pain. 06/25/15   Arlee Muslim, PA-C  polyethylene glycol powder (GLYCOLAX/MIRALAX) powder Use 17 grams daily as needed. Patient taking differently: Take 17 g by mouth daily as needed (constipation). Use 17 grams daily as needed. 11/26/14   Lafayette Dragon, MD  PROAIR HFA 108 [phone] Base) MCG/ACT inhaler TAKE 2 PUFFS AS NEEDED EVERY 4-6 HOURS AS NEEDED COUGH/WHEEZE INHALATION 90 04/09/15   Historical Provider, MD  RESTASIS 0.05 % ophthalmic emulsion Place 1 drop into both eyes 2 (two) times daily. Uses as directed 09/30/12   Historical Provider, MD  rivaroxaban (XARELTO) 10 MG TABS tablet Take 1 tablet (10 mg total) by mouth daily with breakfast. Take Xarelto for two and a half more weeks, then discontinue Xarelto. Once the patient has completed the Xarelto, they may resume the 81 mg Aspirin. 06/25/15   Arlee Muslim, PA-C   rosuvastatin (CRESTOR) 10 MG tablet TAKE ONE HALF TABLET ONCE A DAY AT BEDTIME ORALLY 90 DAY(S) 03/28/15   Historical Provider, MD  sodium chloride (OCEAN) 0.65 % SOLN nasal spray Place 2 sprays into both nostrils as needed for congestion.    Historical Provider, MD  traMADol (ULTRAM) 50 MG tablet Take 1-2 tablets (50-100 mg total) by mouth every 6 (six) hours as needed (mild pain). 06/25/15   Arlee Muslim, PA-C  valACYclovir (VALTREX) 500 MG tablet Take 1 tablet (500 mg total) by mouth 2 (two) times daily. 06/03/15   Huel Cote, NP   BP 130/64 mmHg  Pulse 88  Temp(Src) 98.4 F (36.9 C) (Oral)  Resp 20  SpO2 96% Physical Exam  Constitutional: She is oriented to person, place, and time. She appears well-developed and well-nourished.  HENT:  Head: Normocephalic and atraumatic.  Eyes: Conjunctivae are normal. Right eye exhibits no discharge.  Neck: Neck supple.  Cardiovascular: Normal rate, regular rhythm and normal heart sounds.   No murmur heard. Pulmonary/Chest: Effort normal and breath sounds normal. She has no wheezes. She has no rales.  Abdominal: Soft. She exhibits no distension. There is no tenderness.  Musculoskeletal: Normal range of motion. She exhibits no edema.  L knee with sig swelling, mild erytehma, no drainage.   Neurological: She is oriented to person, place, and time. No cranial nerve deficit.  Skin: Skin is warm and dry. No rash noted. She is not diaphoretic.  Psychiatric: She has a normal mood and affect. Her behavior is normal.  Nursing note and vitals reviewed.   ED Course  Procedures (including critical care time) Labs  Review Labs Reviewed  CBC WITH DIFFERENTIAL/PLATELET  COMPREHENSIVE METABOLIC PANEL    Imaging Review No results found. I have personally reviewed and evaluated these images and lab results as part of my medical decision-making.   EKG Interpretation None      MDM   Final diagnoses:  None    Patient is a 75 year old female with  history of recent left knee replacement by Dr. Fabienne Bruns. She reports increased swelling and erythema to left knee. She reports fever to 99.4 with chills at home. No fever here. Picture of knee included in note.  Patient already on [place]. Clinically does not appear like a DVT given that there is no swelling distally.Will get CBC and CHem 7, have ortho on call look at CBC and picture, make decision about abx.  Will have her get [place] tomorrow morning, again she is already on xarlto with no SOB of vital sig changes.      9:35 PM Discussed with orthopedics. They do not recommend antibiotics. Recommend follow-up with Dr. Toy Cookey this week.  Loral Campi Julio Alm, MD 07/04/15 2136

## 2015-07-04 NOTE — ED Notes (Signed)
Pt refuses to get undressed and put on hospital gown.

## 2015-07-05 ENCOUNTER — Ambulatory Visit (HOSPITAL_COMMUNITY)
Admission: RE | Admit: 2015-07-05 | Discharge: 2015-07-05 | Disposition: A | Discharge status: Home / Self Care | Payer: Medicare Other | Source: Ambulatory Visit | Attending: Physician Assistant | Admitting: Physician Assistant

## 2015-07-05 DIAGNOSIS — M79604 Pain in right leg: Secondary | ICD-10-CM | POA: Insufficient documentation

## 2015-07-05 DIAGNOSIS — M79609 Pain in unspecified limb: Secondary | ICD-10-CM

## 2015-07-05 DIAGNOSIS — E785 Hyperlipidemia, unspecified: Secondary | ICD-10-CM | POA: Insufficient documentation

## 2015-07-05 DIAGNOSIS — M7989 Other specified soft tissue disorders: Secondary | ICD-10-CM

## 2015-07-05 NOTE — Progress Notes (Signed)
VASCULAR LAB PRELIMINARY  PRELIMINARY  PRELIMINARY  PRELIMINARY  Left lower extremity venous duplex completed.    Preliminary report:  There is no DVT or SVT noted in the left lower extremity.  Enlarged lymph node noted in the left groin.  Keoki Mchargue, RVT 07/05/2015, 8:43 AM

## 2015-07-12 ENCOUNTER — Other Ambulatory Visit: Payer: Self-pay | Admitting: Internal Medicine

## 2015-07-13 ENCOUNTER — Telehealth: Payer: Self-pay | Admitting: Gastroenterology

## 2015-07-13 NOTE — Telephone Encounter (Signed)
Patient recently underwent knee replacement and feels her IBS symptoms are worse. Reassured patient and advised her that we will try to schedule for a office visit.

## 2015-07-13 NOTE — Telephone Encounter (Signed)
Kimberly [name] Please try to work pt in for visit with APP I am away on vacation Feb 15-20

## 2015-07-14 ENCOUNTER — Ambulatory Visit (INDEPENDENT_AMBULATORY_CARE_PROVIDER_SITE_OTHER): Payer: Medicare Other | Admitting: Gastroenterology

## 2015-07-14 ENCOUNTER — Encounter: Payer: Self-pay | Admitting: Gastroenterology

## 2015-07-14 ENCOUNTER — Telehealth: Payer: Self-pay | Admitting: Gastroenterology

## 2015-07-14 VITALS — BP 124/78 | HR 72 | Ht 67.0 in | Wt 169.1 lb

## 2015-07-14 DIAGNOSIS — K589 Irritable bowel syndrome without diarrhea: Secondary | ICD-10-CM | POA: Diagnosis not present

## 2015-07-14 DIAGNOSIS — R110 Nausea: Secondary | ICD-10-CM | POA: Diagnosis not present

## 2015-07-14 NOTE — Progress Notes (Addendum)
     07/14/2015 SAIRY LEWI [id number] Oct 12, 1940   History of Present Illness:  This is a 75 year old female who is newly known to Dr. Hilarie Fredrickson after Dr. Nichola Sizer retirement.  She has IBS-C and was doing well with Miralax and Linzess when she was seen in 05/2015.  She underwent a left TKR at the end of January and would like some recommendations regarding her medications for constipation and nausea.  She says that she was told by her surgeon to take colace, and then Miralax once or twice daily.  Someone told her that she should not take Linzess with Xarelto, which she was on for DVT prophylaxis, but is no longer on this.  Says that she is not sure which recommendations to follow.  She is no longer taking pain medication.  She also complains of some nausea since her surgery as well and has not felt like eating much.  Has omeprazole at home, which she really just takes as needed and also has zofran as well.    Recent CBC and CMP ok.  Last EGD 01/2012 by Dr. Sharlett Iles was normal. Last colonoscopy 01/2012 by Dr. Sharlett Iles was normal.   Current Medications, Allergies, Past Medical History, Past Surgical History, Family History and Social History were reviewed in [facility] record.   Physical Exam: BP 124/78 mmHg  Pulse 72  Ht 5\' 7"  (1.702 m)  Wt 169 lb 1.6 oz (76.703 kg)  BMI 26.48 kg/m2 General: Well developed white female in no acute distress Head: Normocephalic and atraumatic Eyes:  Sclerae anicteric, conjunctiva pink  Ears: Normal auditory acuity Lungs: Clear throughout to auscultation Heart: Regular rate and rhythm Abdomen: Soft, non-distended.  Normal bowel sounds.  Non-tender. Musculoskeletal: Symmetrical.  Left knee with recent incision and some swelling/erythema. Extremities: No edema. Neurological: Alert oriented x 4, grossly non-focal Psychological:  Alert and cooperative. Normal mood and affect  Assessment and Recommendations: -75 year old female  with IBS-C.  Recently underwent TKR and some medication and lifestyle changes.  Comes in today to get some clarification on her GI meds and what she should be taking.  She should restart her Linzess daily for her constipation and lower abdominal discomfort related to her IBS.  Can use Miralax once or twice daily as well if needed but hopefully she will once again begin doing well with the Linzess.  Does not need to take colace.  Continue daily probiotic in the form of align.  For recent complaints of nausea, we will have her begin taking her omeprazole daily for a couple of weeks and can use Zofran 15-20 minutes before meals prn.  Would eat a bland diet for now and increase as tolerated.  Call back for any ongoing issues.  Addendum: Reviewed and agree with initial management. Jerene Bears, MD

## 2015-07-14 NOTE — Patient Instructions (Signed)
Discontinue colace Restart Linzess daily Take Miralax once or twice daily if needed Take Align probiotic daily Begin Omeprazole 20 mg daily Take Zofran 15-20 minutes before meals as needed Bland Diet  (BLAND DIET)  WHAT GENERAL GUIDELINES DO I NEED TO FOLLOW?  Choose fruits, vegetables, whole grains, low-fat dairy products, and low-fat meat, fish, and poultry.  Limit fats such as oils, salad dressings, butter, nuts, and avocado.  Keep a food diary to identify foods that cause symptoms.  Avoid foods that cause reflux. These may be different for different people.  Eat frequent small meals instead of three large meals each day.  Eat your meals slowly, in a relaxed setting.  Limit fried foods.  Cook foods using methods other than frying.  Avoid drinking alcohol.  Avoid drinking large amounts of liquids with your meals.  Avoid bending over or lying down until 2-3 hours after eating. WHAT FOODS ARE NOT RECOMMENDED? The following are some foods and drinks that may worsen your symptoms: Vegetables Tomatoes. Tomato juice. Tomato and spaghetti sauce. Chili peppers. Onion and garlic. Horseradish. Fruits Oranges, grapefruit, and lemon (fruit and juice). Meats High-fat meats, fish, and poultry. This includes hot dogs, ribs, ham, sausage, salami, and bacon. Dairy Whole milk and chocolate milk. Sour cream. Cream. Butter. Ice cream. Cream cheese.  Beverages Coffee and tea, with or without caffeine. Carbonated beverages or energy drinks. Condiments Hot sauce. Barbecue sauce.  Sweets/Desserts Chocolate and cocoa. Donuts. Peppermint and spearmint. Fats and Oils High-fat foods, including [place] fries and potato chips. Other Vinegar. Strong spices, such as black pepper, white pepper, red pepper, cayenne, curry powder, cloves, ginger, and chili powder. The items listed above may not be a complete list of foods and beverages to avoid. Contact your dietitian for more information.    This information is not intended to replace advice given to you by your health care provider. Make sure you discuss any questions you have with your health care provider.   Document Released: 05/17/2005 Document Revised: 06/07/2014 Document Reviewed: 03/21/2013 Elsevier Interactive Patient Education [employer].

## 2015-07-15 ENCOUNTER — Other Ambulatory Visit: Payer: Self-pay | Admitting: Emergency Medicine

## 2015-07-15 MED ORDER — LINACLOTIDE 145 MCG PO CAPS: 145.0000 ug | ORAL_CAPSULE | Freq: Every day | ORAL | Status: DC

## 2015-07-15 NOTE — Telephone Encounter (Signed)
Rx sent to pharmacy, patient informed.

## 2015-07-17 ENCOUNTER — Ambulatory Visit: Payer: Medicare Other | Admitting: Physician Assistant

## 2016-01-18 ENCOUNTER — Emergency Department (HOSPITAL_COMMUNITY)
Admission: EM | Admit: 2016-01-18 | Discharge: 2016-01-18 | Disposition: A | Discharge status: Home / Self Care | Payer: Medicare Other | Attending: Emergency Medicine | Admitting: Emergency Medicine

## 2016-01-18 ENCOUNTER — Encounter (HOSPITAL_COMMUNITY): Payer: Self-pay | Admitting: Emergency Medicine

## 2016-01-18 ENCOUNTER — Emergency Department (HOSPITAL_COMMUNITY): Payer: Medicare Other

## 2016-01-18 DIAGNOSIS — Z7982 Long term (current) use of aspirin: Secondary | ICD-10-CM | POA: Diagnosis not present

## 2016-01-18 DIAGNOSIS — Z8673 Personal history of transient ischemic attack (TIA), and cerebral infarction without residual deficits: Secondary | ICD-10-CM | POA: Insufficient documentation

## 2016-01-18 DIAGNOSIS — Z96653 Presence of artificial knee joint, bilateral: Secondary | ICD-10-CM | POA: Insufficient documentation

## 2016-01-18 DIAGNOSIS — Z87891 Personal history of nicotine dependence: Secondary | ICD-10-CM | POA: Diagnosis not present

## 2016-01-18 DIAGNOSIS — E039 Hypothyroidism, unspecified: Secondary | ICD-10-CM | POA: Insufficient documentation

## 2016-01-18 DIAGNOSIS — R55 Syncope and collapse: Secondary | ICD-10-CM | POA: Diagnosis not present

## 2016-01-18 DIAGNOSIS — Z7901 Long term (current) use of anticoagulants: Secondary | ICD-10-CM | POA: Diagnosis not present

## 2016-01-18 DIAGNOSIS — Z79899 Other long term (current) drug therapy: Secondary | ICD-10-CM | POA: Diagnosis not present

## 2016-01-18 DIAGNOSIS — Z96641 Presence of right artificial hip joint: Secondary | ICD-10-CM | POA: Diagnosis not present

## 2016-01-18 LAB — DIFFERENTIAL
Basophils Absolute: 0 10*3/uL (ref 0.0–0.1)
Basophils Relative: 0 %
Eosinophils Absolute: 0.1 10*3/uL (ref 0.0–0.7)
Eosinophils Relative: 1 %
Lymphocytes Relative: 32 %
Lymphs Abs: 2.3 10*3/uL (ref 0.7–4.0)
Monocytes Absolute: 0.5 10*3/uL (ref 0.1–1.0)
Monocytes Relative: 8 %
Neutro Abs: 4.1 10*3/uL (ref 1.7–7.7)
Neutrophils Relative %: 59 %

## 2016-01-18 LAB — URINALYSIS, ROUTINE W REFLEX MICROSCOPIC
Bilirubin Urine: NEGATIVE
Glucose, UA: NEGATIVE mg/dL
Hgb urine dipstick: NEGATIVE
Ketones, ur: 15 mg/dL — AB
Leukocytes, UA: NEGATIVE
Nitrite: NEGATIVE
Protein, ur: NEGATIVE mg/dL
Specific Gravity, Urine: 1.015 (ref 1.005–1.030)
pH: 8 (ref 5.0–8.0)

## 2016-01-18 LAB — CBC
HCT: 40.8 % (ref 36.0–46.0)
Hemoglobin: 13.5 g/dL (ref 12.0–15.0)
MCH: 30.2 pg (ref 26.0–34.0)
MCHC: 33.1 g/dL (ref 30.0–36.0)
MCV: 91.3 fL (ref 78.0–100.0)
Platelets: 225 10*3/uL (ref 150–400)
RBC: 4.47 MIL/uL (ref 3.87–5.11)
RDW: 13.3 % (ref 11.5–15.5)
WBC: 7.5 10*3/uL (ref 4.0–10.5)

## 2016-01-18 LAB — BASIC METABOLIC PANEL
Anion gap: 11 (ref 5–15)
BUN: 15 mg/dL (ref 6–20)
CO2: 22 mmol/L (ref 22–32)
Calcium: 9.9 mg/dL (ref 8.9–10.3)
Chloride: 106 mmol/L (ref 101–111)
Creatinine, Ser: 0.85 mg/dL (ref 0.44–1.00)
GFR calc Af Amer: >60 mL/min (ref >60)
GFR calc non Af Amer: >60 mL/min (ref >60)
Glucose, Bld: 132 mg/dL — ABNORMAL HIGH (ref 65–99)
Potassium: 3.4 mmol/L — ABNORMAL LOW (ref 3.5–5.1)
Sodium: 139 mmol/L (ref 135–145)

## 2016-01-18 LAB — TROPONIN I: Troponin I: <0.03 ng/mL (ref <0.03)

## 2016-01-18 MED ORDER — ONDANSETRON HCL 4 MG/2ML IJ SOLN
4.0000 mg | Freq: Once | INTRAMUSCULAR | Status: AC
  Administered 2016-01-18: 4 mg via INTRAVENOUS
  Filled 2016-01-18: qty 2

## 2016-01-18 NOTE — ED Triage Notes (Signed)
EMS called out for syncope. Pt was c/o constipation and was going to the bathroom and had a syncope episode. Pt unsure if LOC. No injuries. Pt "spiting up" in triage and anxious with tearful outbursts.

## 2016-01-18 NOTE — ED Notes (Signed)
MD at bedside. 

## 2016-01-18 NOTE — ED Provider Notes (Signed)
[facility] DEPT Provider Note   CSN: [id number] Arrival date & time: 01/18/16  0420     History   Chief Complaint Chief Complaint  Patient presents with  . Dizziness  . Near Syncope    HPI Kimberly [name] is a 75 y.o. female.  The history is provided by the patient and the spouse.  She has a complex medical history that includes lacunar stroke, irritable bowel syndrome, pancreatitis, ulcerative colitis, hyperlipidemia had either a fall or a near syncopal episode. She got up at about 3 AM to go to the bathroom. She had a sense that she had to both urinated move her bowels. Her husband heard her fall and found her in POSITION with her neck flexed. He straightened her out and she seemed to be alert and breathing. She denied chest pain. She thinks she may have gotten lightheaded but does not remember clearly. Currently, she is complaining of nausea but not pain. She denies any numbness or tingling. She also states that she woke up at about midnight to urinate and noted that she was urinating an excessive amount. There was no polyuria prior to this.  Past Medical History:  Diagnosis Date  . Allergy   . Anal polyp 1998   Flex Sig   . Anemia   . Angular blepharitis of left eye   . Ankle fracture    Stress fracture  . Anxiety   . Anxiety   . Asthma    border line has inhaler  . Bunion   . Cataracts, bilateral   . Chills (without fever)   . Corneal scar    left eye  . Degenerative disc disease   . Depression   . Diverticulosis of colon (without mention of hemorrhage) 2010   Colonoscopy  . Dry eyes    bilateral  . External hemorrhoids 2000   Colonoscopy  . Family history of malignant neoplasm of gastrointestinal tract   . Fibromyalgia   . GERD (gastroesophageal reflux disease)   . Hiatal hernia 2005,2010   EGD  . History of bronchitis   . History of measles   . History of mumps   . History of strep sore throat   . History of urinary tract infection   . Hyperlipemia    . Hypothyroidism   . IBS (irritable bowel syndrome)   . IBS (irritable bowel syndrome)   . Imbalance   . Internal hemorrhoids without mention of complication 0000000   Colonoscopy   . Irregular heart beat   . Itching   . 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[place])   . Menopause   . Migraine   . OSA (obstructive sleep apnea) 05/14/2015  . Osteopenia 10/2013   T score -1.6 FRAX 10%/1.6%  . Pancreatitis   . PCO (posterior capsular opacification)    left  . Pneumonia    childhood illness  . PONV (postoperative nausea and vomiting)   . Pseudophakia, both eyes   . PVD (posterior vitreous detachment) right  . Rash    on back   . Retinal scar    left  . Rotator cuff disorder    pain, left shoulder  . Shingles   . Stricture and stenosis of esophagus 2005,2010   EGD   . Stroke ([place])   . Ulcerative colitis ([place])   . Varicose veins   . Vertigo   . Wears glasses     Patient Active Problem List   Diagnosis Date Noted  . IBS (irritable bowel syndrome) 07/14/2015  . Nausea  without vomiting 07/14/2015  . OA (osteoarthritis) of knee 06/23/2015  . OSA (obstructive sleep apnea) 05/14/2015  . Preoperative clearance 01/08/2015  . Pancreatitis 07/22/2013  . Leukocytosis, unspecified 07/22/2013  . 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 Cataracts, bilateral   . Elevated cholesterol   . Degenerative disc disease   . Bunion   . Ankle fracture   . 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[place])   . Hemorrhoid   . DUB (dysfunctional uterine bleeding)   . DIARRHEA 09/16/2009  . PERSONAL HX COLONIC POLYPS 09/16/2009  . LACUNAR INFARCTION 07/16/2008  . CONSTIPATION 07/12/2008  . CHEST PAIN 07/12/2008  . DYSPHAGIA 07/12/2008  . HYPOTHYROIDISM 07/10/2008  . ANXIETY 07/10/2008  . DEPRESSION 07/10/2008  . INTERNAL HEMORRHOIDS 07/10/2008  . ESOPHAGEAL STRICTURE 07/10/2008  . GERD 07/10/2008  . HIATAL HERNIA 07/10/2008  . DIVERTICULOSIS, COLON 07/10/2008  . IRRITABLE BOWEL SYNDROME 07/10/2008  . Osteoarthritis  07/10/2008  . FIBROMYALGIA 07/10/2008    Past Surgical History:  Procedure Laterality Date  . ABDOMINAL HYSTERECTOMY    . ANAL FISSURE REPAIR    . BREAST LUMPECTOMY Left    Benign  . BUNIONECTOMY  2014  . CATARACT EXTRACTION Bilateral 2013  . COLONOSCOPY     polyp removed  . ESOPHAGEAL DILATION    . EYE SURGERY Left   . FOOT SURGERY     left   . HEMORRHOID SURGERY    . MOUTH SURGERY  11/13/11   Cyst removed from gum-benign   . NASAL SEPTUM SURGERY    . NASAL SEPTUM SURGERY  1970's  . REPLACEMENT TOTAL KNEE Right 2011  . TONSILLECTOMY    . TOTAL ABDOMINAL HYSTERECTOMY W/ BILATERAL SALPINGOOPHORECTOMY  1991   TAH BSO  . TOTAL HIP ARTHROPLASTY     right   . TOTAL KNEE ARTHROPLASTY     both  . TOTAL KNEE ARTHROPLASTY Left 06/23/2015   Procedure: LEFT TOTAL KNEE ARTHROPLASTY;  Surgeon: Gaynelle Arabian, MD;  Location: [facility] ORS;  Service: Orthopedics;  Laterality: Left;    OB History    Gravida Para Term Preterm AB Living   0             SAB TAB Ectopic Multiple Live Births                   Home Medications    Prior to Admission medications   Medication Sig Start Date End Date Taking? Authorizing Provider  acetaminophen (TYLENOL) 500 MG tablet Take 500 mg by mouth as needed.    Historical Provider, MD  aspirin 81 MG tablet Take 81 mg by mouth daily.    Historical Provider, MD  diphenhydrAMINE (BENADRYL) 25 mg capsule Take 25 mg by mouth every 6 (six) hours as needed for itching.    Historical Provider, MD  docusate sodium (COLACE) 100 MG capsule Take 100 mg by mouth 2 (two) times daily as needed for mild constipation.     Historical Provider, MD  EPINEPHrine 0.3 mg/0.3 mL IJ SOAJ injection Inject 0.3 mg into the muscle once. Reported on 05/28/2015    Historical Provider, MD  fluticasone (FLONASE) 50 MCG/ACT nasal spray Place 2 sprays into both nostrils every other day.  06/06/13   Historical Provider, MD  hydrocortisone (ANUSOL-HC) 25 MG suppository Place 25 mg rectally as  needed for hemorrhoids or itching.    Historical Provider, MD  levothyroxine (SYNTHROID, LEVOTHROID) 112 MCG tablet Take 112 mcg by mouth daily before breakfast.    Historical Provider, MD  Linaclotide Rolan Lipa) 145  MCG CAPS capsule Take 1 capsule (145 mcg total) by mouth daily. 07/15/15   Jessica D Zehr, PA-C  montelukast (SINGULAIR) 10 MG tablet Take 1 tablet by mouth every other day.  04/09/15   Historical Provider, MD  omeprazole (PRILOSEC) 20 MG capsule TAKE 1 CAPSULE (20 MG TOTAL) BY MOUTH DAILY. 07/14/15   Jerene Bears, MD  ondansetron (ZOFRAN) 4 MG tablet Take 1 tablet (4 mg total) by mouth every 6 (six) hours as needed for nausea. 06/25/15   Arlee Muslim, PA-C  polyethylene glycol powder (GLYCOLAX/MIRALAX) powder Use 17 grams daily as needed. Patient not taking: Reported on 07/14/2015 11/26/14   Lafayette Dragon, MD  PROAIR HFA 108 [phone] Base) MCG/ACT inhaler TAKE 2 PUFFS AS NEEDED EVERY 4-6 HOURS AS NEEDED COUGH/WHEEZE INHALATION 90 04/09/15   Historical Provider, MD  RESTASIS 0.05 % ophthalmic emulsion Place 1 drop into both eyes 2 (two) times daily. Uses as directed 09/30/12   Historical Provider, MD  rivaroxaban (XARELTO) 10 MG TABS tablet Take 1 tablet (10 mg total) by mouth daily with breakfast. Take Xarelto for two and a half more weeks, then discontinue Xarelto. Once the patient has completed the Xarelto, they may resume the 81 mg Aspirin. Patient not taking: Reported on 07/14/2015 06/25/15   Arlee Muslim, PA-C  rosuvastatin (CRESTOR) 10 MG tablet TAKE ONE HALF TABLET ONCE A DAY AT BEDTIME ORALLY 90 DAY(S) 03/28/15   Historical Provider, MD  sodium chloride (OCEAN) 0.65 % SOLN nasal spray Place 2 sprays into both nostrils as needed for congestion.    Historical Provider, MD    Family History Family History  Problem Relation Age of Onset  . Dementia Mother   . Irritable bowel syndrome Mother   . Alzheimer's disease Mother   . Stroke Mother   . Hypertension Father   . Congestive Heart Failure  Father   . Alcohol abuse Father   . Diabetes Sister   . Colon cancer Paternal Aunt   . Heart disease Maternal Grandmother   . Heart attack Maternal Grandmother   . Heart attack Paternal Grandfather   . Heart attack Maternal Grandfather   . Arthritis Brother   . Other Sister     pituitary disease  . Allergies Sister   . Other Sister     joint problems  . Esophageal cancer Neg Hx   . Rectal cancer Neg Hx   . Stomach cancer Neg Hx     Social History Social History  Substance Use Topics  . Smoking status: Former Smoker    Packs/day: 0.25    Years: 15.00    Types: Cigarettes    Quit date: 06/01/1975  . Smokeless tobacco: Never Used     Comment: Stopped smoking age 45  . Alcohol use No     Allergies   Azithromycin; Erythromycin; Almond oil; Atorvastatin calcium [atorvastatin]; Augmentin [amoxicillin-pot clavulanate]; Codeine; Ketoconazole; Morphine sulfate; Shellfish allergy; Simvastatin; Sulfamethoxazole-trimethoprim; and Clindamycin/lincomycin   Review of Systems Review of Systems  All other systems reviewed and are negative.    Physical Exam Updated Vital Signs BP 127/86   Pulse 68   Resp 26   SpO2 99%   Physical Exam  Nursing note and vitals reviewed.  75 year old female, appears uncomfortable, but is in no acute distress. Vital signs are significant for tachypnea. Oxygen saturation is 99%, which is normal. Head is normocephalic and atraumatic. PERRLA, EOMI. Oropharynx is clear. Neck is nontender without adenopathy or JVD. Back is nontender and there is  no CVA tenderness. Lungs are clear without rales, wheezes, or rhonchi. Chest is nontender. Heart has regular rate and rhythm without murmur. Abdomen is soft, flat, nontender without masses or hepatosplenomegaly and peristalsis is hypoactive. Extremities have no cyanosis or edema, full range of motion is present. Skin is warm and dry without rash. Neurologic: Mental status is normal, cranial nerves are intact,  there are no motor or sensory deficits.  ED Treatments / Results  Labs (all labs ordered are listed, but only abnormal results are displayed) Labs Reviewed  BASIC METABOLIC PANEL - Abnormal; Notable for the following:       Result Value   Potassium 3.4 (*)    Glucose, Bld 132 (*)    All other components within normal limits  URINALYSIS, ROUTINE W REFLEX MICROSCOPIC (NOT AT [facility]) - Abnormal; Notable for the following:    Ketones, ur 15 (*)    All other components within normal limits  CBC  TROPONIN I  DIFFERENTIAL  CBG MONITORING, ED    EKG  EKG Interpretation  Date/Time:  Sunday January 18 2016 04:28:14 EDT Ventricular Rate:  59 PR Interval:    QRS Duration: 113 QT Interval:  454 QTC Calculation: 450 R Axis:   -43 Text Interpretation:  Sinus rhythm Short PR interval Abnormal R-wave progression, early transition Left ventricular hypertrophy When compared with ECG of 06/16/2014, No significant change was found Confirmed by [facility]  MD, Laurel Smeltz ([id number]) on 01/18/2016 4:35:25 AM       Radiology Dg Chest 1 View  Result Date: 01/18/2016 CLINICAL DATA:  Syncope and fall. EXAM: CHEST 1 VIEW COMPARISON:  07/22/2013 FINDINGS: No cardiomegaly for technique. Stable presumed fat pad at the cardiac apex. Stable aortic and hilar contours. There is no edema, consolidation, effusion, or pneumothorax. No acute osseous finding. IMPRESSION: No evidence of active disease. Electronically Signed   By: Monte Fantasia M.D.   On: 01/18/2016 05:57   Ct Head Wo Contrast  Result Date: 01/18/2016 CLINICAL DATA:  Syncope and fall with occipital injury. Initial encounter. EXAM: CT HEAD WITHOUT CONTRAST CT CERVICAL SPINE WITHOUT CONTRAST TECHNIQUE: Multidetector CT imaging of the head and cervical spine was performed following the standard protocol without intravenous contrast. Multiplanar CT image reconstructions of the cervical spine were also generated. COMPARISON:  Cervical spine MRI 12/13/2014 FINDINGS: CT  HEAD FINDINGS Brain: Normal. No evidence of acute infarction, hemorrhage, hydrocephalus, or mass lesion/mass effect. Vascular: No hyperdense vessel or unexpected calcification. Skull: Negative for fracture. Sinuses/Orbits: No hemo sinus.  Bilateral cataract resection. Other: None. CT CERVICAL SPINE FINDINGS Alignment: No traumatic malalignment. Skull base and vertebrae: No  acute fracture. No aggressive process. Soft tissues and canal: No prevertebral fluid. No gross canal hematoma. Upper chest: No apical pneumothorax. Clustered subpleural reticular and nodular opacities is likely scarring. IMPRESSION: No evidence of acute intracranial or cervical spine injury. Electronically Signed   By: Monte Fantasia M.D.   On: 01/18/2016 05:50   Ct Cervical Spine Wo Contrast  Result Date: 01/18/2016 CLINICAL DATA:  Syncope and fall with occipital injury. Initial encounter. EXAM: CT HEAD WITHOUT CONTRAST CT CERVICAL SPINE WITHOUT CONTRAST TECHNIQUE: Multidetector CT imaging of the head and cervical spine was performed following the standard protocol without intravenous contrast. Multiplanar CT image reconstructions of the cervical spine were also generated. COMPARISON:  Cervical spine MRI 12/13/2014 FINDINGS: CT HEAD FINDINGS Brain: Normal. No evidence of acute infarction, hemorrhage, hydrocephalus, or mass lesion/mass effect. Vascular: No hyperdense vessel or unexpected calcification. Skull: Negative for fracture. Sinuses/Orbits: No  hemo sinus.  Bilateral cataract resection. Other: None. CT CERVICAL SPINE FINDINGS Alignment: No traumatic malalignment. Skull base and vertebrae: No  acute fracture. No aggressive process. Soft tissues and canal: No prevertebral fluid. No gross canal hematoma. Upper chest: No apical pneumothorax. Clustered subpleural reticular and nodular opacities is likely scarring. IMPRESSION: No evidence of acute intracranial or cervical spine injury. Electronically Signed   By: Monte Fantasia M.D.   On:  01/18/2016 05:50    Procedures Procedures (including critical care time)  Medications Ordered in ED Medications  ondansetron (ZOFRAN) injection 4 mg (not administered)     Initial Impression / Assessment and Plan / ED Course  I have reviewed the triage vital signs and the nursing notes.  Pertinent labs & imaging results that were available during my care of the patient were reviewed by me and considered in my medical decision making (see chart for details).  Clinical Course    Possible fall, possible syncope or near-syncope. Workup is initiated with CT of head and cervical spine, and screening labs. ECG shows no acute changes. Review of old records shows no relevant past visits.  X-rays and CT scans are unremarkable. Laboratory workup is significant for a drop in hemoglobin from value from last year. With static vital signs are obtained showing no significant change in pulse or blood pressure. She shows no evidence of significant anemia. She is referred back to her PCP for further evaluation. She is also complaining that she has difficulty breathing because of nasal congestion and her allergist had suggested she may have nasal polyps. She is given a referral to ENT.  Final Clinical Impressions(s) / ED Diagnoses   Final diagnoses:  Near syncope    New Prescriptions New Prescriptions   No medications on file     Delora Fuel, MD [id number] [phone]

## 2016-01-18 NOTE — ED Notes (Signed)
Patient transported to CT 

## 2016-01-18 NOTE — Discharge Instructions (Signed)
Make an appointment with the ENT physician to evaluate possible nasal polyps.

## 2016-01-18 NOTE — ED Notes (Signed)
Taken to xray department at this time. 

## 2016-02-04 DIAGNOSIS — J310 Chronic rhinitis: Secondary | ICD-10-CM | POA: Insufficient documentation

## 2016-02-13 ENCOUNTER — Encounter: Payer: Self-pay | Admitting: Gynecology

## 2016-02-13 ENCOUNTER — Ambulatory Visit (INDEPENDENT_AMBULATORY_CARE_PROVIDER_SITE_OTHER): Payer: Medicare Other | Admitting: Gynecology

## 2016-02-13 VITALS — BP 124/80 | Ht 67.0 in | Wt 178.0 lb

## 2016-02-13 DIAGNOSIS — Z23 Encounter for immunization: Secondary | ICD-10-CM | POA: Diagnosis not present

## 2016-02-13 DIAGNOSIS — N952 Postmenopausal atrophic vaginitis: Secondary | ICD-10-CM

## 2016-02-13 DIAGNOSIS — Z01419 Encounter for gynecological examination (general) (routine) without abnormal findings: Secondary | ICD-10-CM

## 2016-02-13 DIAGNOSIS — M8580 Other specified disorders of bone density and structure, unspecified site: Secondary | ICD-10-CM | POA: Diagnosis not present

## 2016-02-13 NOTE — Progress Notes (Signed)
    Kimberly [name] [birth year] [id number]        75 y.o.  G0P0  for breast and pelvic exam.  Past medical history,surgical history, problem list, medications, allergies, family history and social history were all reviewed and documented as reviewed in the EPIC chart.  ROS:  Performed with pertinent positives and negatives included in the history, assessment and plan.   Additional significant findings :  None   Exam: Caryn Bee assistant Vitals:   02/13/16 0941  BP: 124/80  Weight: 178 lb (80.7 kg)  Height: 5\' 7"  (1.702 m)   Body mass index is 27.88 kg/m.  General appearance:  Normal affect, orientation and appearance. Skin: Grossly normal HEENT: Without gross lesions.  No cervical or supraclavicular adenopathy. Thyroid normal.  Lungs:  Clear without wheezing, rales or rhonchi Cardiac: RR, without RMG Abdominal:  Soft, nontender, without masses, guarding, rebound, organomegaly or hernia Breasts:  Examined lying and sitting without masses, retractions, discharge or axillary adenopathy. Pelvic:  Ext/BUS/Vagina with atrophic changes  Adnexa without masses or tenderness    Anus and perineum normal   Rectovaginal normal sphincter tone without palpated masses or tenderness.    Assessment/Plan:  75 y.o. G0P0 female for breast and pelvic exam.   1. Postmenopausal/atrophic genital changes. Status post TAH/BSO in the past. Doing well without significant hot flushes, night sweats or vaginal dryness. Continue to monitor report any issues. 2. Osteopenia. DEXA 10/2014 T score -1.6 FRAX 10%/1.6%. Recommend follow up DEXA next year at 2 year interval. 3. Pap smear 2011. No Pap smear done today. No history of significant abnormal Pap smears. Per current screening guidelines based on age and hysterectomy history were both agree to stop screening. 4. Colonoscopy 2013. Repeat at their recommended interval. 5. Mammography coming due in November and I reminded her to schedule this. SBE monthly  reviewed. 6. Health maintenance. No routine lab work done as patient does this elsewhere. Follow up 1 year, sooner as needed.   Anastasio Auerbach MD, 10:15 AM 02/13/2016

## 2016-02-13 NOTE — Addendum Note (Signed)
Addended by: Nelva Nay on: 02/13/2016 10:21 AM   Modules accepted: Orders

## 2016-02-13 NOTE — Patient Instructions (Signed)

## 2016-03-11 ENCOUNTER — Other Ambulatory Visit: Payer: Self-pay | Admitting: Geriatric Medicine

## 2016-03-11 DIAGNOSIS — Z1231 Encounter for screening mammogram for malignant neoplasm of breast: Secondary | ICD-10-CM

## 2016-04-14 ENCOUNTER — Ambulatory Visit
Admission: RE | Admit: 2016-04-14 | Discharge: 2016-04-14 | Disposition: A | Discharge status: Home / Self Care | Payer: Medicare Other | Source: Ambulatory Visit | Attending: Geriatric Medicine | Admitting: Geriatric Medicine

## 2016-04-14 DIAGNOSIS — Z1231 Encounter for screening mammogram for malignant neoplasm of breast: Secondary | ICD-10-CM

## 2016-05-07 ENCOUNTER — Encounter: Payer: Self-pay | Admitting: Cardiology

## 2016-05-13 ENCOUNTER — Encounter: Payer: Self-pay | Admitting: Cardiology

## 2016-05-13 ENCOUNTER — Ambulatory Visit (INDEPENDENT_AMBULATORY_CARE_PROVIDER_SITE_OTHER): Payer: Medicare Other | Admitting: Cardiology

## 2016-05-13 VITALS — BP 150/80 | HR 67 | Ht 67.0 in | Wt 182.8 lb

## 2016-05-13 DIAGNOSIS — G4733 Obstructive sleep apnea (adult) (pediatric): Secondary | ICD-10-CM | POA: Diagnosis not present

## 2016-05-13 DIAGNOSIS — R55 Syncope and collapse: Secondary | ICD-10-CM | POA: Diagnosis not present

## 2016-05-13 DIAGNOSIS — R002 Palpitations: Secondary | ICD-10-CM | POA: Diagnosis not present

## 2016-05-13 NOTE — Patient Instructions (Signed)
Medication Instructions:  Your physician recommends that you continue on your current medications as directed. Please refer to the Current Medication list given to you today.   Labwork: None  Testing/Procedures: Your physician has requested that you have an echocardiogram. Echocardiography is a painless test that uses sound waves to create images of your heart. It provides your doctor with information about the size and shape of your heart and how well your heart's chambers and valves are working. This procedure takes approximately one hour. There are no restrictions for this procedure.  Follow-Up: You have been referred to Dr. Toy Cookey for assessment for oral device. Dr. Theodosia Blender CPAP assistant will send them all your information and they will call you with an appointment.   Your physician wants you to follow-up in: 1 year with Dr. Radford Pax. You will receive a reminder letter in the mail two months in advance. If you don't receive a letter, please call our office to schedule the follow-up appointment.   Any Other Special Instructions Will Be Listed Below (If Applicable).     If you need a refill on your cardiac medications before your next appointment, please call your pharmacy.

## 2016-05-13 NOTE — Progress Notes (Signed)
Cardiology Office Note    Date:  05/13/2016   ID:  Kimberly [name], DOB [birth year], MRN [id number]  PCP:  Mathews Argyle, MD  Cardiologist:  Fransico Him, MD   Chief Complaint  Patient presents with  . Sleep Apnea    History of Present Illness:  Kimberly [name] is a 75 y.o. female  With history of moderate OSA with an AHI of 16/hr and oxygen saturations as low as 75% and <89% for 12 minutes and is now on CPAP at 12cm H2O.  She is having problems with her CPAP therapy. She uses a full face mask and had not been able to find a mask that fits well without leaking.  She feels the pressure is adequate.  She has some problems with her allergies and is seeing an allergist and is using nasal spray.  Since going on the CPAP she has noticed that she feels more rested in the am with less daytime sleepiness. She denies any significant mouth dryness but has dryness of her eyes. She says that this summer she had a syncopal episode when she went to go to the bathroom and was sitting on the commode.  She has not had any more episodes of dizziness or syncope.  She has noticed some episodes of palpitations but it was related to drinking a lot of caffeine   Past Medical History:  Diagnosis Date  . Allergy   . Anal polyp 1998   Flex Sig   . Anemia   . Angular blepharitis of left eye   . Ankle fracture    Stress fracture  . Anxiety   . Asthma    border line has inhaler  . Bunion   . Cataracts, bilateral   . Chills (without fever)   . Corneal scar    left eye  . Degenerative disc disease   . Depression   . Diverticulosis of colon (without mention of hemorrhage) 2010   Colonoscopy  . Dry eyes    bilateral  . External hemorrhoids 2000   Colonoscopy  . Family history of malignant neoplasm of gastrointestinal tract   . Fibromyalgia   . GERD (gastroesophageal reflux disease)   . Hiatal hernia 2005,2010   EGD  . History of bronchitis   . History of measles   . History of mumps   .  History of strep sore throat   . History of urinary tract infection   . Hyperlipemia   . Hypothyroidism   . IBS (irritable bowel syndrome)   . Imbalance   . Internal hemorrhoids without mention of complication 0000000   Colonoscopy   . Irregular heart beat   . Itching   . 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 Menopause   . Migraine   . OSA (obstructive sleep apnea) 05/14/2015  . Osteopenia 10/2013   T score -1.6 FRAX 10%/1.6%  . Pancreatitis   . PCO (posterior capsular opacification)    left  . Pneumonia    childhood illness  . PONV (postoperative nausea and vomiting)   . Pseudophakia, both eyes   . PVD (posterior vitreous detachment) right  . Rash    on back   . Retinal scar    left  . Rotator cuff disorder    pain, left shoulder  . Shingles   . Stricture and stenosis of esophagus 2005,2010   EGD   . Stroke ([place])   . Ulcerative colitis ([place])   . Varicose veins   . Vertigo   .  Wears glasses     Past Surgical History:  Procedure Laterality Date  . ABDOMINAL HYSTERECTOMY    . ANAL FISSURE REPAIR    . BREAST LUMPECTOMY Left    Benign  . BUNIONECTOMY  2014  . CATARACT EXTRACTION Bilateral 2013  . COLONOSCOPY     polyp removed  . ESOPHAGEAL DILATION    . EYE SURGERY Left   . FOOT SURGERY     left   . HEMORRHOID SURGERY    . MOUTH SURGERY  11/13/11   Cyst removed from gum-benign   . NASAL SEPTUM SURGERY    . NASAL SEPTUM SURGERY  1970's  . REPLACEMENT TOTAL KNEE Right 2011  . TONSILLECTOMY    . TOTAL ABDOMINAL HYSTERECTOMY W/ BILATERAL SALPINGOOPHORECTOMY  1991   TAH BSO  . TOTAL HIP ARTHROPLASTY     right   . TOTAL KNEE ARTHROPLASTY     both  . TOTAL KNEE ARTHROPLASTY Left 06/23/2015   Procedure: LEFT TOTAL KNEE ARTHROPLASTY;  Surgeon: Gaynelle Arabian, MD;  Location: [facility];  Service: Orthopedics;  Laterality: Left;    Current Medications: Outpatient Medications Prior to Visit  Medication Sig Dispense Refill  . acetaminophen (TYLENOL) 500 MG tablet Take 500 mg by  mouth as needed.    Marland Kitchen aspirin 81 MG tablet Take 81 mg by mouth daily.    . diphenhydrAMINE (BENADRYL) 25 mg capsule Take 25 mg by mouth every 6 (six) hours as needed for itching.    . docusate sodium (COLACE) 100 MG capsule Take 100 mg by mouth 2 (two) times daily as needed for mild constipation.     Marland Kitchen EPINEPHrine 0.3 mg/0.3 mL IJ SOAJ injection Inject 0.3 mg into the muscle once. Reported on 05/28/2015    . fluticasone (FLONASE) 50 MCG/ACT nasal spray Place 2 sprays into both nostrils every other day.     . hydrocortisone (ANUSOL-HC) 25 MG suppository Place 25 mg rectally as needed for hemorrhoids or itching.    . levothyroxine (SYNTHROID, LEVOTHROID) 112 MCG tablet Take 112 mcg by mouth daily before breakfast.    . montelukast (SINGULAIR) 10 MG tablet Take 1 tablet by mouth every other day.   5  . polyethylene glycol powder (GLYCOLAX/MIRALAX) powder Use 17 grams daily as needed. 500 g 0  . PROAIR HFA 108 (90 Base) MCG/ACT inhaler TAKE 2 PUFFS AS NEEDED EVERY 4-6 HOURS AS NEEDED COUGH/WHEEZE INHALATION 90  0  . RESTASIS 0.05 % ophthalmic emulsion Place 1 drop into both eyes 2 (two) times daily. Uses as directed    . rosuvastatin (CRESTOR) 10 MG tablet TAKE ONE HALF TABLET ONCE A DAY AT BEDTIME ORALLY 90 DAY(S)  4  . sodium chloride (OCEAN) 0.65 % SOLN nasal spray Place 2 sprays into both nostrils as needed for congestion.    . Linaclotide (LINZESS) 145 MCG CAPS capsule Take 1 capsule (145 mcg total) by mouth daily. 30 capsule 3  . omeprazole (PRILOSEC) 20 MG capsule TAKE 1 CAPSULE (20 MG TOTAL) BY MOUTH DAILY. 30 capsule 5   No facility-administered medications prior to visit.      Allergies:   Azithromycin; Erythromycin; Almond oil; Atorvastatin calcium [atorvastatin]; Augmentin [amoxicillin-pot clavulanate]; Codeine; Ketoconazole; Morphine sulfate; Shellfish allergy; Simvastatin; Sulfamethoxazole-trimethoprim; and Clindamycin/lincomycin   Social History   Social History  . Marital status:  Married    Spouse name: N/A  . Number of children: 1  . Years of education: bus.school   Occupational History  . Retired   .  Retired  Social History Main Topics  . Smoking status: Former Smoker    Packs/day: 0.25    Years: 15.00    Types: Cigarettes    Quit date: 06/01/1975  . Smokeless tobacco: Never Used     Comment: Stopped smoking age 64  . Alcohol use No  . Drug use: No  . Sexual activity: No     Comment: HYST, intecourse age unknown, sexual partner less than 5   Other Topics Concern  . Not on file   Social History Narrative   Daily caffeine      Family History:  The patient's family history includes Alcohol abuse in her father; Allergies in her sister; Alzheimer's disease in her mother; Arthritis in her brother; Colon cancer in her paternal aunt; Congestive Heart Failure in her father; Dementia in her mother; Diabetes in her sister; Heart attack in her maternal grandfather, maternal grandmother, and paternal grandfather; Heart disease in her maternal grandmother; Hypertension in her father; Irritable bowel syndrome in her mother; Other in her sister and sister; Stroke in her mother.   ROS:   Please see the history of present illness.    ROS All other systems reviewed and are negative.  No flowsheet data found.     PHYSICAL EXAM:   VS:  BP (!) 150/80   Pulse 67   Ht 5\' 7"  (1.702 m)   Wt 182 lb 12.8 oz (82.9 kg)   SpO2 97%   BMI 28.63 kg/m    GEN: Well nourished, well developed, in no acute distress  HEENT: normal  Neck: no JVD, carotid bruits, or masses Cardiac: RRR; no murmurs, rubs, or gallops,no edema.  Intact distal pulses bilaterally.  Respiratory:  clear to auscultation bilaterally, normal work of breathing GI: soft, nontender, nondistended, + BS MS: no deformity or atrophy  Skin: warm and dry, no rash Neuro:  Alert and Oriented x 3, Strength and sensation are intact Psych: euthymic mood, full affect  Wt Readings from Last 3 Encounters:  05/13/16  182 lb 12.8 oz (82.9 kg)  02/13/16 178 lb (80.7 kg)  07/14/15 169 lb 1.6 oz (76.7 kg)      Studies/Labs Reviewed:   EKG:  EKG is not ordered today.  Recent Labs: 07/04/2015: ALT 10 01/18/2016: BUN 15; Creatinine, Ser 0.85; Hemoglobin 13.5; Platelets 225; Potassium 3.4; Sodium 139   Lipid Panel    Component Value Date/Time   CHOL 124 07/23/2013 0632   TRIG 69 07/23/2013 0632   HDL 68 07/23/2013 0632   CHOLHDL 1.8 07/23/2013 0632   VLDL 14 07/23/2013 0632   LDLCALC 42 07/23/2013 [id number]    Additional studies/ records that were reviewed today include:  CPAP download    ASSESSMENT:    1. OSA (obstructive sleep apnea)   2. Syncope, unspecified syncope type   3. Heart palpitations      PLAN:  In order of problems listed above:  1.  OSA - the patient is tolerating PAP therapy well but is having problems with finding a mask that fits without leaking. The PAP download was reviewed today and showed an AHI of 12.5/hr on 12 cm H2O with 23% compliance in using more than 4 hours nightly.  She is very frustrated and is not sleeping well due to the mask. Her OSA is mild to moderate so I have recommended that we refer her to Dr. Augustina Mood DDS to get fitted with an oral device and see if we can improve her AHI any better that way.  2.  Syncope - I suspect that this was related to a vagal event.  I will check a 2D echo to make sure LVF is normal. I have asked her to let me know if she has any further episodes.  Workup in ER was normal. 3.  Palpitations that seem related to caffeine intake.  I have recommended that she cut back on her caffeine and let me know if she has any further problems.    Medication Adjustments/Labs and Tests Ordered: Current medicines are reviewed at length with the patient today.  Concerns regarding medicines are outlined above.  Medication changes, Labs and Tests ordered today are listed in the Patient Instructions below.  There are no Patient Instructions on  file for this visit.   Signed, Fransico Him, MD  05/13/2016 9:12 AM    [facility] [address], [place], [place]  [address] Phone: [phone]; Fax: [phone]

## 2016-05-19 ENCOUNTER — Ambulatory Visit: Payer: Medicare Other | Admitting: Internal Medicine

## 2016-06-03 ENCOUNTER — Telehealth: Payer: Self-pay | Admitting: *Deleted

## 2016-06-03 ENCOUNTER — Telehealth: Payer: Self-pay | Admitting: Cardiology

## 2016-06-03 NOTE — Telephone Encounter (Signed)
The patient was referred over to Dr Augustina Mood by Dr Radford Pax for an oral device evaluation on 05/13/2016. The referral was sent on 05/14/2016. Today the patient is calling because she has not heard from the dentist office about her appointment. I have re faxed the referral today and a confirmation sheet has printed and sandy at the dentist office confirmed she got the fax

## 2016-06-03 NOTE — Telephone Encounter (Signed)
New Message    Dr Radford Pax referred her to Dr Toy Cookey and they are not responding for an appointment.    Why does she have to call to find out if ultrasound is covered, when you ordered the test?   Her dentist needs copy of ultrasound for her to get dental implants also,  Dr Fara Boros @[facility] [phone]

## 2016-06-08 ENCOUNTER — Ambulatory Visit (HOSPITAL_COMMUNITY): Payer: Medicare Other | Attending: Internal Medicine

## 2016-06-08 ENCOUNTER — Other Ambulatory Visit: Payer: Self-pay

## 2016-06-08 ENCOUNTER — Encounter: Payer: Self-pay | Admitting: Cardiology

## 2016-06-08 DIAGNOSIS — I361 Nonrheumatic tricuspid (valve) insufficiency: Secondary | ICD-10-CM | POA: Diagnosis not present

## 2016-06-08 DIAGNOSIS — R002 Palpitations: Secondary | ICD-10-CM | POA: Diagnosis present

## 2016-06-08 DIAGNOSIS — I501 Left ventricular failure: Secondary | ICD-10-CM | POA: Diagnosis not present

## 2016-06-08 DIAGNOSIS — R55 Syncope and collapse: Secondary | ICD-10-CM

## 2016-06-08 DIAGNOSIS — I340 Nonrheumatic mitral (valve) insufficiency: Secondary | ICD-10-CM | POA: Insufficient documentation

## 2016-06-08 DIAGNOSIS — R9439 Abnormal result of other cardiovascular function study: Secondary | ICD-10-CM | POA: Insufficient documentation

## 2016-06-09 ENCOUNTER — Telehealth: Payer: Self-pay | Admitting: Cardiology

## 2016-06-09 NOTE — Telephone Encounter (Signed)
-----   Message from Sueanne Margarita, MD sent at 06/08/2016  1:03 PM EST ----- Echo showed normal LVF with icnreased stiffness of heart normal for her age, mild TR

## 2016-06-09 NOTE — Telephone Encounter (Signed)
Ms. Kren is returning your call. Thanks.

## 2016-06-09 NOTE — Telephone Encounter (Signed)
Informed patient of results and verbal understanding expressed.  

## 2016-06-15 ENCOUNTER — Telehealth: Payer: Self-pay | Admitting: Cardiology

## 2016-06-15 NOTE — Telephone Encounter (Signed)
Copy of ECHO and clearance placed at front desk for patient pick-up.  Clearance was originally faxed 06/11/16.

## 2016-06-15 NOTE — Telephone Encounter (Signed)
I have attached a Release to front of envelope for patient to sign before she can take the records.  Museum/gallery conservator at [employer] has been made aware and will get patient to sign for me.

## 2016-06-15 NOTE — Telephone Encounter (Signed)
New message      Pt called stating that she will be at our office today at 25 with her husband and want to pick up a copy of the clearance to Dr Fara Boros and a copy of her echo.  She states that she is frustrated that this has not been done; but want to pick it up today

## 2016-07-14 ENCOUNTER — Ambulatory Visit: Payer: Medicare Other | Admitting: Internal Medicine

## 2016-07-20 ENCOUNTER — Ambulatory Visit
Admission: RE | Admit: 2016-07-20 | Discharge: 2016-07-20 | Disposition: A | Discharge status: Home / Self Care | Payer: Medicare Other | Source: Ambulatory Visit | Attending: Internal Medicine | Admitting: Internal Medicine

## 2016-07-20 ENCOUNTER — Other Ambulatory Visit: Payer: Self-pay | Admitting: Internal Medicine

## 2016-07-20 DIAGNOSIS — M25512 Pain in left shoulder: Secondary | ICD-10-CM

## 2016-08-11 ENCOUNTER — Encounter: Payer: Self-pay | Admitting: Gynecology

## 2016-08-11 ENCOUNTER — Ambulatory Visit (INDEPENDENT_AMBULATORY_CARE_PROVIDER_SITE_OTHER): Payer: Medicare Other | Admitting: Gynecology

## 2016-08-11 VITALS — BP 128/80

## 2016-08-11 DIAGNOSIS — K469 Unspecified abdominal hernia without obstruction or gangrene: Secondary | ICD-10-CM | POA: Diagnosis not present

## 2016-08-11 DIAGNOSIS — N8111 Cystocele, midline: Secondary | ICD-10-CM

## 2016-08-11 NOTE — Patient Instructions (Signed)
Follow up if you want to try the pessary for vaginal support or call if you want referral to a surgeon to consider surgery.

## 2016-08-11 NOTE — Progress Notes (Signed)
    Kimberly [name] Jan 03, 1941 [id number]        76 y.o.  G0P0 presents complaining of possible bladder prolapse. Patient notes over the last month or so pressure and something protruding vaginally. It does not hurt but just feels different. No urinary symptoms such as incontinence urgency dysuria. Having some constipation for which she uses Myralax. Status post TAH/BSO in the past.  Past medical history,surgical history, problem list, medications, allergies, family history and social history were all reviewed and documented in the EPIC chart.  Directed ROS with pertinent positives and negatives documented in the history of present illness/assessment and plan.  Exam: Copywriter, advertising Vitals:   08/11/16 1418  BP: 128/80   General appearance:  Normal Abdomen soft nontender without masses guarding rebound Pelvic external BUS vagina with atrophic changes. High cystocele/enterocele noted with straining to within 1-2 fingerbreadths of the introital opening. No proximal cystocele. No significant rectocele. Bimanual without masses or tenderness  Assessment/Plan:  76 y.o. G0P0 with high cystocele/enterocele. I reviewed the anatomy of the condition with the patient to include pictures. She does not appear to be significantly uncomfortable from the situation but just feels the presence of this when she is on her feet or bearing down. Is not having any urinary or bowel significant symptoms. Options to include observation pessary and surgery discussed. The pros and cons of each reviewed. My recommendation would be to watch for now if stable and acceptable then to follow. If she feels more uncomfortable than options of trial of pessary encouraged. She is not sexually active and that is not an issue. Ultimately if she would like to discuss surgery I'll refer her and names were provided to include Dr. Nicki Reaper MacDermid and Dr. [place] Pink.    Anastasio Auerbach MD, 2:48 PM 08/11/2016

## 2016-08-12 DIAGNOSIS — G8929 Other chronic pain: Secondary | ICD-10-CM | POA: Insufficient documentation

## 2016-08-25 ENCOUNTER — Other Ambulatory Visit (INDEPENDENT_AMBULATORY_CARE_PROVIDER_SITE_OTHER): Payer: Medicare Other

## 2016-08-25 ENCOUNTER — Encounter: Payer: Self-pay | Admitting: Internal Medicine

## 2016-08-25 ENCOUNTER — Ambulatory Visit (INDEPENDENT_AMBULATORY_CARE_PROVIDER_SITE_OTHER): Payer: Medicare Other | Admitting: Internal Medicine

## 2016-08-25 VITALS — BP 122/80 | HR 76 | Ht 66.54 in | Wt 182.0 lb

## 2016-08-25 DIAGNOSIS — K219 Gastro-esophageal reflux disease without esophagitis: Secondary | ICD-10-CM

## 2016-08-25 DIAGNOSIS — K581 Irritable bowel syndrome with constipation: Secondary | ICD-10-CM | POA: Diagnosis not present

## 2016-08-25 DIAGNOSIS — R109 Unspecified abdominal pain: Secondary | ICD-10-CM

## 2016-08-25 LAB — BUN: BUN: 18 mg/dL (ref 6–23)

## 2016-08-25 LAB — CREATININE, SERUM: Creatinine, Ser: 0.72 mg/dL (ref 0.40–1.20)

## 2016-08-25 NOTE — Patient Instructions (Addendum)
Your physician has requested that you go to the basement for the following lab work before leaving today: BUN, Creatinine  Discontinue omeprazole.  Please purchase the following medications over the counter and take as directed: Zantac 150 mg once daily as needed Miralax 1/2 capful (9 grams) dissolved in at least 8 ounces water/juice daily  You have been scheduled for a CT scan of the abdomen and pelvis at [facility] ([address]---this is in the same building as [facility]).   You are scheduled on Wednesday 09/01/16 at 11:00 am You should arrive 15 minutes prior to your appointment time for registration. Please follow the written instructions below on the day of your exam:  WARNING: IF YOU ARE ALLERGIC TO IODINE/X-RAY DYE, PLEASE NOTIFY RADIOLOGY IMMEDIATELY AT [phone]! YOU WILL BE GIVEN A 13 HOUR PREMEDICATION PREP.  1) Do not eat or drink anything after 7:00 am (4 hours prior to your test) 2) You have been given 2 bottles of oral contrast to drink. The solution may taste better if refrigerated, but do NOT add ice or any other liquid to this solution. Shake well before drinking.    Drink 1 bottle of contrast @ 9:00 am (2 hours prior to your exam)  Drink 1 bottle of contrast @ 10:00 am (1 hour prior to your exam)  You may take any medications as prescribed with a small amount of water except for the following: Metformin, Glucophage, Glucovance, Avandamet, Riomet, Fortamet, Actoplus Met, Janumet, Glumetza or Metaglip. The above medications must be held the day of the exam AND 48 hours after the exam.  The purpose of you drinking the oral contrast is to aid in the visualization of your intestinal tract. The contrast solution may cause some diarrhea. Before your exam is started, you will be given a small amount of fluid to drink. Depending on your individual set of symptoms, you may also receive an intravenous injection of x-ray contrast/dye. Plan on being at   HealthCare for 30 minutes or longer, depending on the type of exam you are having performed.  This test typically takes 30-45 minutes to complete.  If you have any questions regarding your exam or if you need to reschedule, you may call the CT department at [phone] between the hours of 8:00 am and 5:00 pm, Monday-Friday.  ________________________________________________________________________  If you are age 65 or older, your body mass index should be between 23-30. Your Body mass index is 28.9 kg/m. If this is out of the aforementioned range listed, please consider follow up with your Primary Care Provider.  If you are age 64 or younger, your body mass index should be between 19-25. Your Body mass index is 28.9 kg/m. If this is out of the aformentioned range listed, please consider follow up with your Primary Care Provider.    

## 2016-08-25 NOTE — Progress Notes (Signed)
Subjective:    Patient ID: Kimberly [name], female    DOB: Feb 07, 1941, 76 y.o.   MRN: [id number]  HPI Kimberly [name] is a 76 yo female with PMH of IBS with constipation predominance, colonic diverticulosis, GERD with small hiatal hernia who is here for follow-up. She is here alone today. She was last seen in February 2017 by Alonza Bogus, PA-C.  She reports that recently she has been doing fairly well. She is now off of Linzess because she doesn't feel like it has been necessary. She is using MiraLAX when necessary for constipation and this seems to be working well. She does use Align on a daily basis. For the most part her stools are formed and passed easily. One or 2 days per week she feels that her stools are "pellet-like". On these days she can have left-sided abdominal discomfort. She denies seeing blood in her stool or melena. She does have a history of hemorrhoids and states in years past with straining she has seen blood with wiping but none recently.  Appetite has been good. She is not having heartburn, dysphagia or odynophagia. She's really not using Prilosec regularly. Occasionally she will use this one or 2 days per week. She denies nausea and vomiting. She has always had trouble swallowing large pills. She also feels bloating when she drinks milk products.  She does state that she has issues on and off with her anxiety. She is also worried about her left upper quadrant pain which happens when she has hard stools. She is concerned about her pancreas and concern about underlying malignancy. She has a history of pancreatitis felt antibiotic induced from some years ago.  Her last colonoscopy was performed by Dr. Sharlett Iles in September 2013. This was normal. Last EGD September 2013 was normal as well set for a hiatal hernia.  Review of Systems As per HPI, otherwise negative  Current Medications, Allergies, Past Medical History, Past Surgical History, Family History and Social History were  reviewed in [employer] record.     Objective:   Physical Exam BP 122/80 (BP Location: Left Arm, Patient Position: Sitting, Cuff Size: Normal)   Pulse 76   Ht 5' 6.54" (1.69 m)   Wt 182 lb (82.6 kg)   BMI 28.90 kg/m  Constitutional: Well-developed and well-nourished. No distress. HEENT: Normocephalic and atraumatic. Oropharynx is clear and moist. No oropharyngeal exudate. Conjunctivae are normal.  No scleral icterus. Neck: Neck supple. Trachea midline. Cardiovascular: Normal rate, regular rhythm and intact distal pulses. No M/R/G Pulmonary/chest: Effort normal and breath sounds normal. No wheezing, rales or rhonchi. Abdominal: Soft, nontender, nondistended. Bowel sounds active throughout. There are no masses palpable. No hepatosplenomegaly. Rectal: small skin tags, no masses, nontender, small amount of hard brown stool in vault Extremities: no clubbing, cyanosis, or edema Neurological: Alert and oriented to person place and time. Skin: Skin is warm and dry.  Psychiatric: Normal mood and affect. Behavior is normal.  CBC    Component Value Date/Time   WBC 7.5 01/18/2016 0501   RBC 4.47 01/18/2016 0501   HGB 13.5 01/18/2016 0501   HCT 40.8 01/18/2016 0501   PLT 225 01/18/2016 0501   MCV 91.3 01/18/2016 0501   MCH 30.2 01/18/2016 0501   MCHC 33.1 01/18/2016 0501   RDW 13.3 01/18/2016 0501   LYMPHSABS 2.3 01/18/2016 0505   MONOABS 0.5 01/18/2016 0505   EOSABS 0.1 01/18/2016 0505   BASOSABS 0.0 01/18/2016 0505   CMP     Component  Value Date/Time   NA 139 01/18/2016 0501   K 3.4 (L) 01/18/2016 0501   CL 106 01/18/2016 0501   CO2 22 01/18/2016 0501   GLUCOSE 132 (H) 01/18/2016 0501   BUN 15 01/18/2016 0501   CREATININE 0.85 01/18/2016 0501   CALCIUM 9.9 01/18/2016 0501   PROT 6.6 07/04/2015 2013   ALBUMIN 3.4 (L) 07/04/2015 2013   AST 19 07/04/2015 2013   ALT 10 (L) 07/04/2015 2013   ALKPHOS 49 07/04/2015 2013   BILITOT 0.5 07/04/2015 2013    GFRNONAA >60 01/18/2016 0501   GFRAA >60 01/18/2016 0501       Assessment & Plan:  76 yo female with PMH of IBS with constipation predominance, colonic diverticulosis, GERD with small hiatal hernia who is here for follow-up.  1. IBS with constipation -- her left-sided abdominal pain and pellet-like stools are felt secondary to IBS with constipation. I recommended that we use 8 g of MiraLAX daily. She is very worried about pancreatic malignancy in light of her history of pancreatitis (though antibiotic induced an isolated). I have tried to provide reassurance. She requests cross-sectional imaging and after discussion we will proceed with CT scanning of the abdomen and pelvis with IV contrast.  2. GERD -- intermittent. I explained that omeprazole is not a great medicine to use intermittently. Discontinue omeprazole. Begin Zantac over-the-counter per box instruction as needed only  3. CRC screening -- up-to-date, next colonoscopy would be around 2023  Annual follow-up, sooner if needed 25 minutes spent with the patient today. Greater than 50% was spent in counseling and coordination of care with the patient

## 2016-08-26 ENCOUNTER — Telehealth: Payer: Self-pay | Admitting: Internal Medicine

## 2016-08-26 MED ORDER — POLYETHYLENE GLYCOL 3350 17 GM/SCOOP PO POWD: ORAL | 2 refills | Status: DC

## 2016-08-26 NOTE — Telephone Encounter (Signed)
Rx sent 

## 2016-09-01 ENCOUNTER — Ambulatory Visit (INDEPENDENT_AMBULATORY_CARE_PROVIDER_SITE_OTHER)
Admission: RE | Admit: 2016-09-01 | Discharge: 2016-09-01 | Disposition: A | Discharge status: Home / Self Care | Payer: Medicare Other | Source: Ambulatory Visit | Attending: Internal Medicine | Admitting: Internal Medicine

## 2016-09-01 DIAGNOSIS — R109 Unspecified abdominal pain: Secondary | ICD-10-CM | POA: Diagnosis not present

## 2016-09-02 ENCOUNTER — Telehealth: Payer: Self-pay | Admitting: Internal Medicine

## 2016-09-02 NOTE — Telephone Encounter (Signed)
See result note.  

## 2016-11-13 IMAGING — CT CT CERVICAL SPINE W/O CM
3 of 8 series · 6 of 33 positions shown, 7 images · non-contrast
Comparison: Cervical spine MRI 12/13/2014

CLINICAL DATA: Syncope and fall with occipital injury. Initial
encounter.

EXAM:
CT HEAD WITHOUT CONTRAST
CT CERVICAL SPINE WITHOUT CONTRAST
TECHNIQUE: Multidetector CT imaging of the head and cervical spine was
performed following the standard protocol without intravenous
contrast. Multiplanar CT image reconstructions of the cervical spine
were also generated.

[Series 305: orthogonals · axial · 0.33mm/px · z∈[-15,+49]mm · 2 of 99 slices shown, 3 images]
[im 33/99  soft-tissue]
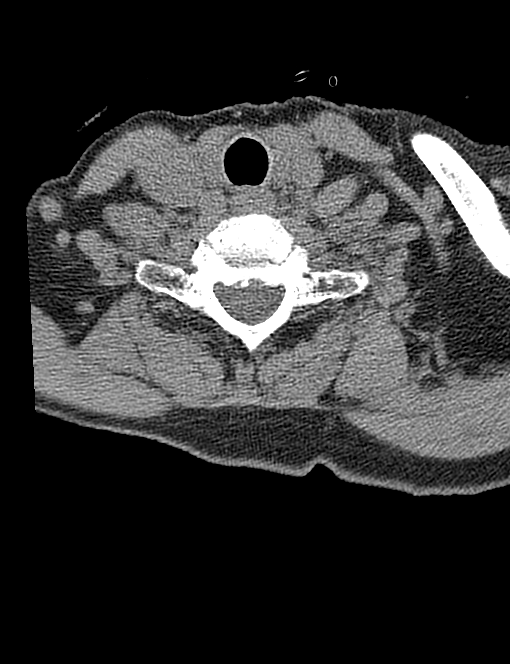
[im 33/99  bone]
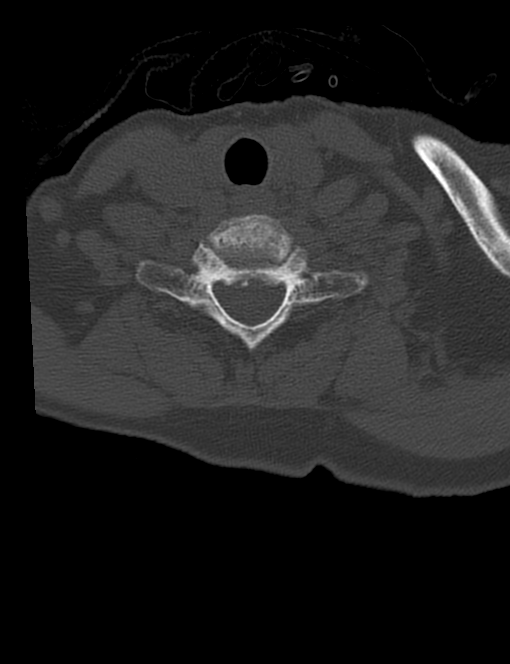
[im 66/99  bone]
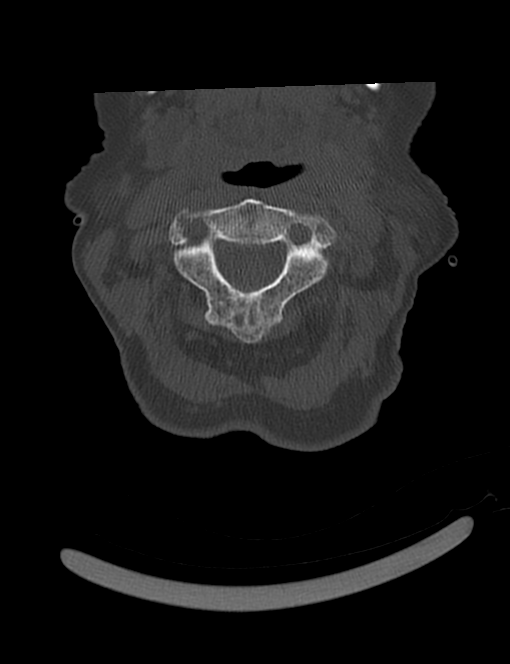

[Series 306: coronal · coronal · 0.33mm/px · 1 of 38 slices shown]
[im 36/38  bone]
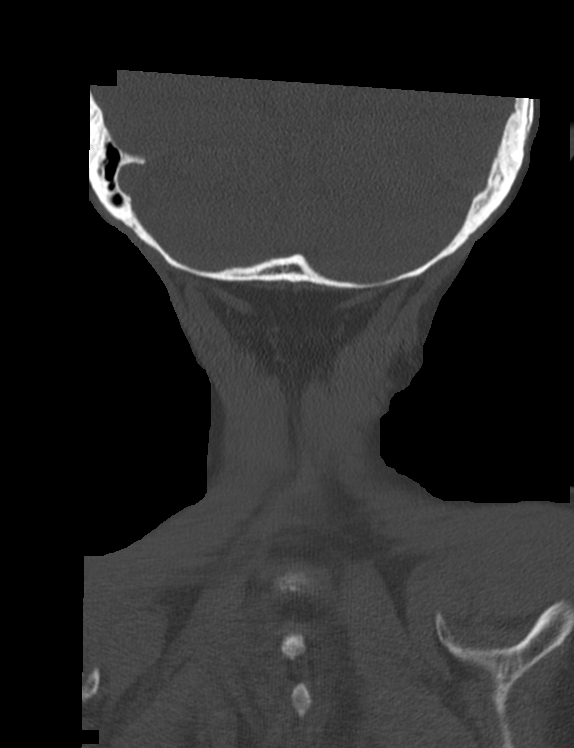

[Series 307: sagital · sagittal · 0.33mm/px · 3 of 51 slices shown]
[im 13/51  bone]
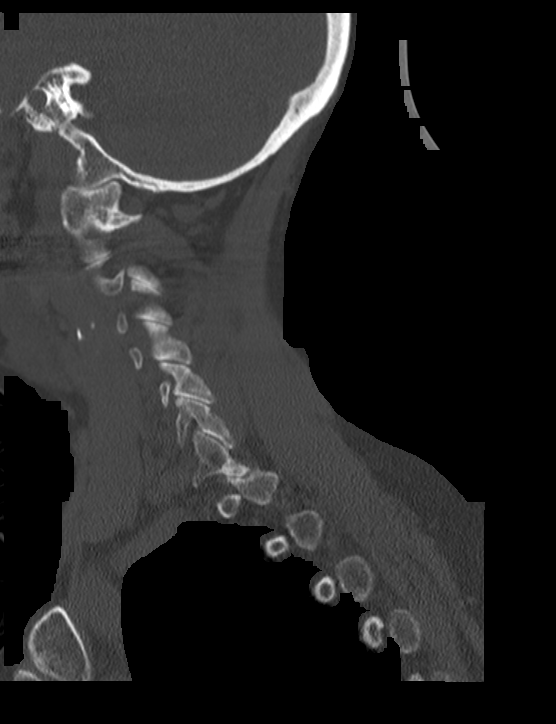
[im 26/51  bone]
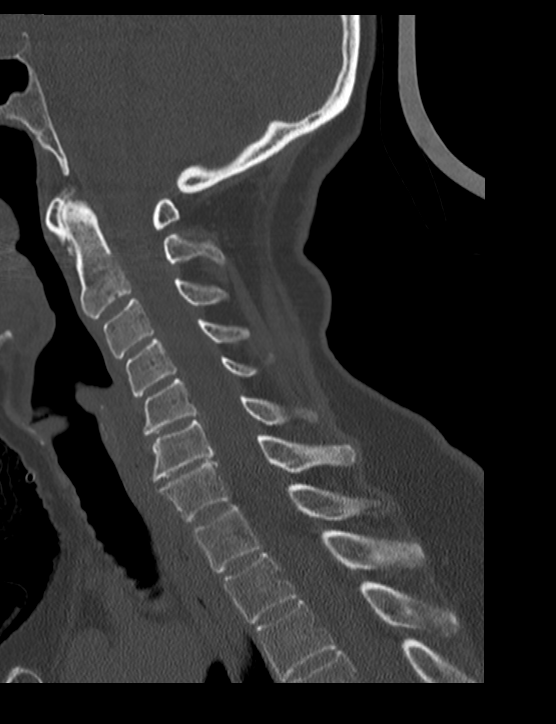
[im 38/51  bone]
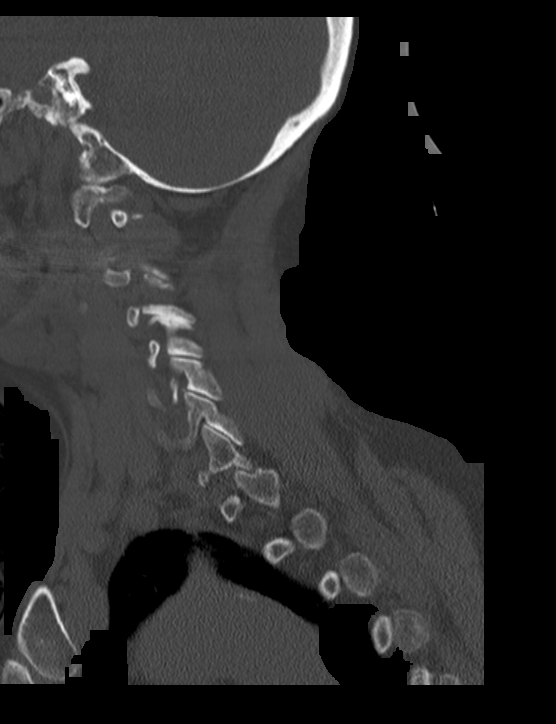

[6 of 33 positions shown; findings below may reference images not displayed]

FINDINGS: CT HEAD FINDINGS

Brain: Normal. No evidence of acute infarction, hemorrhage,
hydrocephalus, or mass lesion/mass effect.

Vascular: No hyperdense vessel or unexpected calcification.

Skull: Negative for fracture.

Sinuses/Orbits: No hemo sinus.  Bilateral cataract resection.

Other: None.

CT CERVICAL SPINE FINDINGS

Alignment: No traumatic malalignment.

Skull base and vertebrae: No  acute fracture. No aggressive process.

Soft tissues and canal: No prevertebral fluid. No gross canal
hematoma.

Upper chest: No apical pneumothorax. Clustered subpleural reticular
and nodular opacities is likely scarring.
IMPRESSION: No evidence of acute intracranial or cervical spine injury.

## 2016-12-30 DIAGNOSIS — M542 Cervicalgia: Secondary | ICD-10-CM | POA: Insufficient documentation

## 2016-12-31 ENCOUNTER — Ambulatory Visit: Payer: Medicare Other | Admitting: Podiatry

## 2017-01-03 ENCOUNTER — Ambulatory Visit (INDEPENDENT_AMBULATORY_CARE_PROVIDER_SITE_OTHER): Payer: Medicare Other | Admitting: Gynecology

## 2017-01-03 ENCOUNTER — Encounter: Payer: Self-pay | Admitting: Gynecology

## 2017-01-03 ENCOUNTER — Telehealth: Payer: Self-pay | Admitting: *Deleted

## 2017-01-03 VITALS — BP 118/78

## 2017-01-03 DIAGNOSIS — N644 Mastodynia: Secondary | ICD-10-CM

## 2017-01-03 NOTE — Progress Notes (Signed)
    Kimberly [name] 04-08-1941 [id number]        76 y.o.  G0P0 presents complaining of several weeks of bilateral breast tenderness. Comes and goes. Has started a new exercise program involving a lot of upper body movement where she is catching a weighted ball. No masses palpated by self-exam.  Past medical history,surgical history, problem list, medications, allergies, family history and social history were all reviewed and documented in the EPIC chart.  Directed ROS with pertinent positives and negatives documented in the history of present illness/assessment and plan.  Exam: Caryn Bee assistant Vitals:   01/03/17 1516  BP: 118/78   General appearance:  Normal Both breast examined lying and sitting without masses retractions discharge adenopathy. Patient's pointing towards the lateral portions of both breasts as far as where the discomfort is.  Assessment/Plan:  76 y.o. G0P0 with bilateral breast tenderness of the last several weeks. Has started a new exercise program catching a weighted ball which I think has contributed to her breast tenderness with jumping around. Exam is normal with no palpable abnormalities. Bilaterality points towards benign etiology. Recommended baseline bilateral diagnostic mammogram. She is coming due regardless for her screening mammogram we'll go ahead and take a look now. Assuming negative patient will follow for now and call if her symptoms persist after several more weeks. She'll call certainly if she feels any abnormalities.    Anastasio Auerbach MD, 3:41 PM 01/03/2017

## 2017-01-03 NOTE — Telephone Encounter (Signed)
-----   Message from Anastasio Auerbach, MD sent at 01/03/2017  3:41 PM EDT ----- Schedule bilateral diagnostic mammography's at the breast center reference bilateral breast tenderness

## 2017-01-03 NOTE — Patient Instructions (Signed)
Follow up for mammogram as scheduled 

## 2017-01-03 NOTE — Telephone Encounter (Signed)
Pt scheduled at breast center on 01/07/17 @ 9:20am, pt informed.

## 2017-01-07 ENCOUNTER — Ambulatory Visit: Payer: Medicare Other

## 2017-01-07 ENCOUNTER — Ambulatory Visit
Admission: RE | Admit: 2017-01-07 | Discharge: 2017-01-07 | Disposition: A | Discharge status: Home / Self Care | Payer: Medicare Other | Source: Ambulatory Visit | Attending: Gynecology | Admitting: Gynecology

## 2017-01-07 DIAGNOSIS — N644 Mastodynia: Secondary | ICD-10-CM

## 2017-02-07 ENCOUNTER — Telehealth: Payer: Self-pay | Admitting: *Deleted

## 2017-02-07 NOTE — Telephone Encounter (Signed)
Patient had diag. Mammogram on 01/07/17 per breast center radiologist said no bilateral ultrasound needed based on diag. mammogram results. Patient wanted you to be aware with this and if you agree with this as well? Please advise

## 2017-02-07 NOTE — Telephone Encounter (Signed)
Pt informed

## 2017-02-07 NOTE — Telephone Encounter (Signed)
I think if they feel comfortable that they were able to see everything they needed to see on the breast with the mammogram and did not feel ultrasound would add anything to this then I am comfortable with that.

## 2017-02-17 ENCOUNTER — Encounter: Payer: Medicare Other | Admitting: Gynecology

## 2017-03-25 ENCOUNTER — Other Ambulatory Visit: Payer: Self-pay | Admitting: Internal Medicine

## 2017-03-25 ENCOUNTER — Ambulatory Visit
Admission: RE | Admit: 2017-03-25 | Discharge: 2017-03-25 | Disposition: A | Discharge status: Home / Self Care | Payer: Medicare Other | Source: Ambulatory Visit | Attending: Internal Medicine | Admitting: Internal Medicine

## 2017-03-25 DIAGNOSIS — R059 Cough, unspecified: Secondary | ICD-10-CM

## 2017-03-25 DIAGNOSIS — R05 Cough: Secondary | ICD-10-CM

## 2017-03-31 ENCOUNTER — Encounter: Payer: Medicare Other | Admitting: Gynecology

## 2017-05-10 ENCOUNTER — Encounter: Payer: Medicare Other | Admitting: Gynecology

## 2017-05-12 ENCOUNTER — Ambulatory Visit: Payer: Medicare Other | Admitting: Gynecology

## 2017-05-12 ENCOUNTER — Encounter: Payer: Self-pay | Admitting: Gynecology

## 2017-05-12 VITALS — BP 124/82 | Ht 68.0 in | Wt 186.0 lb

## 2017-05-12 DIAGNOSIS — N8111 Cystocele, midline: Secondary | ICD-10-CM

## 2017-05-12 DIAGNOSIS — Z01411 Encounter for gynecological examination (general) (routine) with abnormal findings: Secondary | ICD-10-CM | POA: Diagnosis not present

## 2017-05-12 DIAGNOSIS — N952 Postmenopausal atrophic vaginitis: Secondary | ICD-10-CM

## 2017-05-12 DIAGNOSIS — M8580 Other specified disorders of bone density and structure, unspecified site: Secondary | ICD-10-CM | POA: Diagnosis not present

## 2017-05-12 NOTE — Progress Notes (Signed)
    Kimberly [name] Jun 20, 1940 [id number]        76 y.o.  G0P0 for annual gynecologic exam.  History of cystocele.  Does note pelvic pressure with straining such as heavy lifting or prolonged standing.  Nothing protruding from the vagina.  Not having significant urinary incontinence.  Past medical history,surgical history, problem list, medications, allergies, family history and social history were all reviewed and documented as reviewed in the EPIC chart.  ROS:  Performed with pertinent positives and negatives included in the history, assessment and plan.   Additional significant findings : None   Exam: Caryn Bee assistant Vitals:   05/12/17 1051  BP: 124/82  Weight: 186 lb (84.4 kg)  Height: 5\' 8"  (1.727 m)   Body mass index is 28.28 kg/m.  General appearance:  Normal affect, orientation and appearance. Skin: Grossly normal HEENT: Without gross lesions.  No cervical or supraclavicular adenopathy. Thyroid normal.  Lungs:  Clear without wheezing, rales or rhonchi Cardiac: RR, without RMG Abdominal:  Soft, nontender, without masses, guarding, rebound, organomegaly or hernia Breasts:  Examined lying and sitting without masses, retractions, discharge or axillary adenopathy. Pelvic:  Ext, BUS, Vagina: With atrophic changes.  High cystocele/enterocele to within 2 fingerbreadths of the introitus.  No significant rectocele.  Cuff well supported.  Adnexa: Without masses or tenderness    Anus and perineum: Normal   Rectovaginal: Normal sphincter tone without palpated masses or tenderness.    Assessment/Plan:  76 y.o. G0P0 female for annual gynecologic exam.   1. Postmenopausal/atrophic genital changes.  Status post TAH/BSO in the past.  Without significant hot flushes, night sweats or vaginal dryness. 2. Cystocele/enterocele.  Stable on exam.  Is symptomatic when straining or standing for any length of time.  Not overly bothersome to the patient.  Options for management to include  observation, pessary and surgery reviewed.  Patient not interested in doing anything at this time which I think is reasonable.  We will follow expectantly and she will call if symptoms seem to progress. 3. Mammography 12/2016.  Continue with annual mammography when due.  Breast exam normal today. 4. Osteopenia/porosis.  Reports DEXA recently done at her primary physician's office.  Was told shows some weakness but not to the extent that needs medication.  She does relate having a fall earlier this year with a vertebral body fracture in the lumbar region historically.  I reviewed with her that this in itself is an indication to consider treatment and we discussed the issues of fragility fractures.  She mentioned that this was discussed with her but she at this point elected not to take medication but will follow up with strengthening exercises which she is doing now.  She will continue to follow-up with Dr. Felipa Eth in reference to bone health. 5. Colonoscopy 2013.  Repeat at their recommended interval. 6. Pap smear 2011.  No Pap smear done today.  No history of significant abnormal Pap smears.  We both agree to stop screening per current screening guidelines based on age and hysterectomy history. 7. Health maintenance.  No routine lab work done as patient does this elsewhere.  Follow-up 1 year, sooner as needed.   Anastasio Auerbach MD, 11:34 AM 05/12/2017

## 2017-05-12 NOTE — Patient Instructions (Addendum)
Follow-up up in 1 year, sooner if any issues.

## 2017-05-13 ENCOUNTER — Encounter: Payer: Self-pay | Admitting: Cardiology

## 2017-05-13 ENCOUNTER — Ambulatory Visit: Payer: Medicare Other | Admitting: Cardiology

## 2017-05-13 VITALS — BP 144/80 | HR 71 | Ht 68.0 in | Wt 189.2 lb

## 2017-05-13 DIAGNOSIS — G4733 Obstructive sleep apnea (adult) (pediatric): Secondary | ICD-10-CM

## 2017-05-13 NOTE — Patient Instructions (Addendum)
Medication Instructions:  Your physician recommends that you continue on your current medications as directed. Please refer to the Current Medication list given to you today.   Labwork: None ordered  Testing/Procedures: Your physician has recommended that you have a home sleep study. This test records several body functions during sleep, including: brain activity, eye movement, oxygen and carbon dioxide blood levels, heart rate and rhythm, breathing rate and rhythm, the flow of air through your mouth and nose, snoring, body muscle movements, and chest and belly movement.  Follow-Up: Your physician wants you to follow-up in: 1 year with Dr. Radford Pax. You will receive a reminder letter in the mail two months in advance. If you don't receive a letter, please call our office to schedule the follow-up appointment.   Any Other Special Instructions Will Be Listed Below (If Applicable).     If you need a refill on your cardiac medications before your next appointment, please call your pharmacy.

## 2017-05-13 NOTE — Progress Notes (Signed)
Cardiology Office Note:    Date:  05/13/2017   ID:  Kimberly [name], DOB 10/09/1940, MRN [id number]  PCP:  Lajean Manes, MD  Cardiologist:  Fransico Him, MD   Referring MD: Lajean Manes, MD   Chief Complaint  Patient presents with  . Sleep Apnea    History of Present Illness:    Kimberly [name] is a 76 y.o. female with a hx of moderate OSA with an AHI of 16/hr and oxygen saturations as low as 75% and <89% for 12 minutes and was on CPAP at 12cm H2O. When I saw her last she was not tolerating the CPAP because she could never find a mask that fit and I referred her to Dr. Toy Cookey but she decided not to proceed because she has had so many dental problems.  She denies any daytime sleepiness and feels rested when she gets up in the am but dose wake up around 3 am but does go back to sleep and gets up until 7am.    Past Medical History:  Diagnosis Date  . Allergy   . Anal polyp 1998   Flex Sig   . Anemia   . Angular blepharitis of left eye   . Ankle fracture    Stress fracture  . Anxiety   . Asthma    border line has inhaler  . Bunion   . Cataracts, bilateral   . Chills (without fever)   . Corneal scar    left eye  . Degenerative disc disease   . Depression   . Diverticulosis of colon (without mention of hemorrhage) 2010   Colonoscopy  . Dry eyes    bilateral  . Enterocele   . External hemorrhoids 2000   Colonoscopy  . Family history of malignant neoplasm of gastrointestinal tract   . Female cystocele   . Fibromyalgia   . GERD (gastroesophageal reflux disease)   . GERD (gastroesophageal reflux disease)   . Hiatal hernia 2005,2010   EGD  . History of bronchitis   . History of measles   . History of mumps   . History of strep sore throat   . History of urinary tract infection   . Hyperlipemia   . Hypothyroidism   . IBS (irritable bowel syndrome)   . Imbalance   . Internal hemorrhoids without mention of complication 2703,5009   Colonoscopy   . Irregular heart  beat   . Itching   . Lacunar stroke   . Menopause   . Migraine   . OSA (obstructive sleep apnea) 05/14/2015  . Osteopenia 10/2013   T score -1.6 FRAX 10%/1.6%  . Pancreatitis   . PCO (posterior capsular opacification)    left  . Pneumonia    childhood illness  . PONV (postoperative nausea and vomiting)   . Pseudophakia, both eyes   . PVD (posterior vitreous detachment) right  . Rash    on back   . Retinal scar    left  . Rotator cuff disorder    pain, left shoulder  . Shingles   . Stricture and stenosis of esophagus 2005,2010   EGD   . Stroke ([place])   . Ulcerative colitis ([place])   . Varicose veins   . Vertigo   . Wears glasses     Past Surgical History:  Procedure Laterality Date  . ABDOMINAL HYSTERECTOMY    . ANAL FISSURE REPAIR    . BREAST EXCISIONAL BIOPSY Left   . BUNIONECTOMY  2014  . CATARACT EXTRACTION  Bilateral 2013  . COLONOSCOPY     polyp removed  . ESOPHAGEAL DILATION    . EYE SURGERY Left   . FOOT SURGERY     left   . HEMORRHOID SURGERY    . MOUTH SURGERY  11/13/11   Cyst removed from gum-benign   . NASAL SEPTUM SURGERY    . NASAL SEPTUM SURGERY  1970's  . REPLACEMENT TOTAL KNEE Right 2011  . TONSILLECTOMY    . TOTAL ABDOMINAL HYSTERECTOMY W/ BILATERAL SALPINGOOPHORECTOMY  1991   TAH BSO  . TOTAL HIP ARTHROPLASTY     right   . TOTAL KNEE ARTHROPLASTY     both  . TOTAL KNEE ARTHROPLASTY Left 06/23/2015   Procedure: LEFT TOTAL KNEE ARTHROPLASTY;  Surgeon: Gaynelle Arabian, MD;  Location: [facility];  Service: Orthopedics;  Laterality: Left;    Current Medications: Current Meds  Medication Sig  . acetaminophen (TYLENOL) 500 MG tablet Take 500 mg by mouth as needed.  Marland Kitchen aspirin 81 MG tablet Take 81 mg by mouth daily.  . Calcium Carbonate-Vitamin D (CALTRATE 600+D) 600-400 MG-UNIT tablet Take 1 tablet by mouth 2 (two) times daily.  . Cholecalciferol (VITAMIN D) 2000 units CAPS Take 1 capsule by mouth daily.  Marland Kitchen docusate sodium (COLACE) 100 MG capsule  Take 100 mg by mouth 2 (two) times daily as needed for mild constipation.   Marland Kitchen EPINEPHrine 0.3 mg/0.3 mL IJ SOAJ injection Inject 0.3 mg into the muscle once. Reported on 05/28/2015  . fluticasone (FLONASE) 50 MCG/ACT nasal spray Place 2 sprays into both nostrils every other day.   . ibuprofen (ADVIL,MOTRIN) 200 MG tablet Take 200-400 mg by mouth as needed.  Marland Kitchen levothyroxine (SYNTHROID, LEVOTHROID) 112 MCG tablet Take 100 mcg by mouth daily before breakfast.   . montelukast (SINGULAIR) 10 MG tablet Take 1 tablet by mouth as needed.   . Omega-3 1000 MG CAPS Take 1 capsule by mouth daily.  . pantoprazole (PROTONIX) 40 MG tablet Take 40 mg by mouth daily.  . polyethylene glycol powder (GLYCOLAX/MIRALAX) powder Dissolve 9 grams in at least 8 ounces water/juice and drink once daily  . PROAIR HFA 108 (90 Base) MCG/ACT inhaler TAKE 2 PUFFS AS NEEDED EVERY 4-6 HOURS AS NEEDED COUGH/WHEEZE INHALATION 90  . Probiotic Product (ALIGN) 4 MG CAPS Take 1 capsule by mouth daily.  . RESTASIS 0.05 % ophthalmic emulsion Place 1 drop into both eyes 2 (two) times daily. Uses as directed  . rosuvastatin (CRESTOR) 10 MG tablet TAKE ONE HALF TABLET ONCE A DAY AT BEDTIME ORALLY 90 DAY(S)  . sodium chloride (OCEAN) 0.65 % SOLN nasal spray Place 2 sprays into both nostrils as needed for congestion.  . thiamine (VITAMIN B-1) 100 MG tablet Take 100 mg by mouth daily.     Allergies:   Azithromycin; Erythromycin; Almond oil; Amoxicillin-pot clavulanate; Atorvastatin; Codeine; Ketoconazole; Ketoconazole; Latex; Lincomycin; Morphine; Morphine sulfate; Other; Shellfish allergy; Simvastatin; Sulfamethoxazole-trimethoprim; Clindamycin; Clindamycin/lincomycin; and Erythromycin base   Social History   Socioeconomic History  . Marital status: Married    Spouse name: None  . Number of children: 1  . Years of education: bus.school  . Highest education level: None  Social Needs  . Financial resource strain: None  . Food insecurity  - worry: None  . Food insecurity - inability: None  . Transportation needs - medical: None  . Transportation needs - non-medical: None  Occupational History  . Occupation: Retired    Fish farm manager: RETIRED  Tobacco Use  . Smoking status: Former Smoker  Packs/day: 0.25    Years: 15.00    Pack years: 3.75    Types: Cigarettes    Last attempt to quit: 06/01/1975    Years since quitting: 41.9  . Smokeless tobacco: Never Used  . Tobacco comment: Stopped smoking age 75  Substance and Sexual Activity  . Alcohol use: No    Alcohol/week: 0.0 oz  . Drug use: No  . Sexual activity: No    Birth control/protection: Surgical    Comment: HYST, intecourse age unknown, sexual partner less than 5  Other Topics Concern  . None  Social History Narrative   Daily caffeine      Family History: The patient's family history includes Alcohol abuse in her father; Allergies in her sister; Alzheimer's disease in her mother; Arthritis in her brother; Colon cancer in her paternal aunt; Congestive Heart Failure in her father; Dementia in her mother; Diabetes in her sister; Heart attack in her maternal grandfather, maternal grandmother, and paternal grandfather; Heart disease in her maternal grandmother; Hypertension in her father; Irritable bowel syndrome in her mother; Other in her sister and sister; Stroke in her mother. There is no history of Esophageal cancer, Rectal cancer, or Stomach cancer.  ROS:   Please see the history of present illness.    ROS  All other systems reviewed and negative.   EKGs/Labs/Other Studies Reviewed:    The following studies were reviewed today: CPAP download  EKG:  EKG is not ordered today.  Recent Labs: 08/25/2016: BUN 18; Creatinine, Ser 0.72   Recent Lipid Panel    Component Value Date/Time   CHOL 124 07/23/2013 0632   TRIG 69 07/23/2013 0632   HDL 68 07/23/2013 0632   CHOLHDL 1.8 07/23/2013 0632   VLDL 14 07/23/2013 0632   LDLCALC 42 07/23/2013 1610     Physical Exam:    VS:  BP (!) 144/80   Pulse 71   Ht 5\' 8"  (1.727 m)   Wt 189 lb 3.2 oz (85.8 kg)   SpO2 97%   BMI 28.77 kg/m     Wt Readings from Last 3 Encounters:  05/13/17 189 lb 3.2 oz (85.8 kg)  05/12/17 186 lb (84.4 kg)  08/25/16 182 lb (82.6 kg)     GEN:  Well nourished, well developed in no acute distress HEENT: Normal NECK: No JVD; No carotid bruits LYMPHATICS: No lymphadenopathy CARDIAC: RRR, no murmurs, rubs, gallops RESPIRATORY:  Clear to auscultation without rales, wheezing or rhonchi  ABDOMEN: Soft, non-tender, non-distended MUSCULOSKELETAL:  No edema; No deformity  SKIN: Warm and dry NEUROLOGIC:  Alert and oriented x 3 PSYCHIATRIC:  Normal affect   ASSESSMENT:    1. OSA (obstructive sleep apnea)    PLAN:    In order of problems listed above:  1.  OSA - the patient is tolerating PAP therapy well without any problems. The PAP download was reviewed today and showed an AHI of 0.5/hr on 9 cm H2O with 87% compliance in using more than 4 hours nightly.  The patient has been using and benefiting from CPAP use and will continue to benefit from therapy.      Medication Adjustments/Labs and Tests Ordered: Current medicines are reviewed at length with the patient today.  Concerns regarding medicines are outlined above.  No orders of the defined types were placed in this encounter.  No orders of the defined types were placed in this encounter.   Signed, Fransico Him, MD  05/13/2017 12:28 PM    [facility]

## 2017-05-30 ENCOUNTER — Telehealth: Payer: Self-pay | Admitting: *Deleted

## 2017-05-30 NOTE — Telephone Encounter (Signed)
Informed patient of upcoming home sleep study and patient understanding was verbalized. Patient understands her sleep study will be done at [place] with [facility]. Patient understands she will receive a call in a week or so. Patient understands to call if she does not receive the call in a timely manner. Patient understands she will be contacted by [facility] to set up her cpap. She understands to call if [facility] does not contact her with new setup in a timely manner. She understands she will be called once confirmation has been received from [facility] that she has received her new machine to schedule 10 week follow up appointment.  [facility] notified of new cpap order  Please add to airview She was grateful for the call and thanked me.

## 2017-05-30 NOTE — Telephone Encounter (Signed)
-----   Message from Cleon Gustin, [place] sent at 05/13/2017  1:03 PM EST ----- Regarding: Home Sleep Study Per Dr. Radford Pax, patient needs HOME Sleep Study for OSA. ESS=5  Thanks,  [place]

## 2017-06-01 ENCOUNTER — Telehealth: Payer: Self-pay | Admitting: Internal Medicine

## 2017-06-01 NOTE — Telephone Encounter (Signed)
ERROR

## 2017-06-07 ENCOUNTER — Telehealth: Payer: Self-pay | Admitting: Internal Medicine

## 2017-06-07 NOTE — Telephone Encounter (Signed)
She reports that she is having daily reflux she has tried to control with her diet and pantoprazole.  Patient instructed to maintain an anti-reflux diet. Advised to avoid caffeine, mint, citrus foods/juices, tomatoes,  chocolate, NSAIDS/ASA products.  Instructed not to eat within 2 hours of exercise or bed, multiple small meals are better than 3 large meals.  Need to take PPI 30 minutes prior to 1st meal of the day. She will keep her appt with Dr. Hilarie Fredrickson on 08/01/17.  I mailed her a copy of an anti-reflux diet and measures. She will call back if she fails to improve or her symptoms worsen.

## 2017-06-20 NOTE — Telephone Encounter (Signed)
Patient states that she did not receive the information that was mailed to her. She is requesting that we mail this again.

## 2017-06-20 NOTE — Telephone Encounter (Signed)
Paperwork mailed to pt

## 2017-06-22 ENCOUNTER — Telehealth: Payer: Self-pay | Admitting: Cardiology

## 2017-06-22 DIAGNOSIS — R55 Syncope and collapse: Secondary | ICD-10-CM

## 2017-06-22 NOTE — Telephone Encounter (Signed)
New message    Patient calling for sleep study results. Please call

## 2017-06-23 ENCOUNTER — Other Ambulatory Visit: Payer: Self-pay

## 2017-06-23 ENCOUNTER — Emergency Department (HOSPITAL_COMMUNITY): Payer: Medicare Other

## 2017-06-23 ENCOUNTER — Emergency Department (HOSPITAL_COMMUNITY)
Admission: EM | Admit: 2017-06-23 | Discharge: 2017-06-23 | Disposition: A | Discharge status: Home / Self Care | Payer: Medicare Other | Attending: Emergency Medicine | Admitting: Emergency Medicine

## 2017-06-23 ENCOUNTER — Encounter (HOSPITAL_COMMUNITY): Payer: Self-pay | Admitting: Emergency Medicine

## 2017-06-23 ENCOUNTER — Telehealth: Payer: Self-pay | Admitting: *Deleted

## 2017-06-23 DIAGNOSIS — R110 Nausea: Secondary | ICD-10-CM | POA: Insufficient documentation

## 2017-06-23 DIAGNOSIS — E039 Hypothyroidism, unspecified: Secondary | ICD-10-CM | POA: Diagnosis not present

## 2017-06-23 DIAGNOSIS — R55 Syncope and collapse: Secondary | ICD-10-CM | POA: Diagnosis not present

## 2017-06-23 DIAGNOSIS — J45909 Unspecified asthma, uncomplicated: Secondary | ICD-10-CM | POA: Diagnosis not present

## 2017-06-23 DIAGNOSIS — Z79899 Other long term (current) drug therapy: Secondary | ICD-10-CM | POA: Insufficient documentation

## 2017-06-23 DIAGNOSIS — R1030 Lower abdominal pain, unspecified: Secondary | ICD-10-CM | POA: Insufficient documentation

## 2017-06-23 DIAGNOSIS — R079 Chest pain, unspecified: Secondary | ICD-10-CM | POA: Insufficient documentation

## 2017-06-23 DIAGNOSIS — Z8673 Personal history of transient ischemic attack (TIA), and cerebral infarction without residual deficits: Secondary | ICD-10-CM | POA: Diagnosis not present

## 2017-06-23 DIAGNOSIS — Z91040 Latex allergy status: Secondary | ICD-10-CM | POA: Diagnosis not present

## 2017-06-23 DIAGNOSIS — Z87891 Personal history of nicotine dependence: Secondary | ICD-10-CM | POA: Diagnosis not present

## 2017-06-23 DIAGNOSIS — Z7982 Long term (current) use of aspirin: Secondary | ICD-10-CM | POA: Diagnosis not present

## 2017-06-23 LAB — URINALYSIS, ROUTINE W REFLEX MICROSCOPIC
Bacteria, UA: NONE SEEN
Bilirubin Urine: NEGATIVE
Glucose, UA: NEGATIVE mg/dL
Ketones, ur: NEGATIVE mg/dL
Leukocytes, UA: NEGATIVE
Nitrite: NEGATIVE
Protein, ur: NEGATIVE mg/dL
Specific Gravity, Urine: 1.008 (ref 1.005–1.030)
pH: 8 (ref 5.0–8.0)

## 2017-06-23 LAB — CBC
HCT: 38.9 % (ref 36.0–46.0)
Hemoglobin: 13.3 g/dL (ref 12.0–15.0)
MCH: 31.5 pg (ref 26.0–34.0)
MCHC: 34.2 g/dL (ref 30.0–36.0)
MCV: 92.2 fL (ref 78.0–100.0)
Platelets: 214 10*3/uL (ref 150–400)
RBC: 4.22 MIL/uL (ref 3.87–5.11)
RDW: 13 % (ref 11.5–15.5)
WBC: 11.4 10*3/uL — ABNORMAL HIGH (ref 4.0–10.5)

## 2017-06-23 LAB — BASIC METABOLIC PANEL
Anion gap: 14 (ref 5–15)
BUN: 14 mg/dL (ref 6–20)
CO2: 20 mmol/L — ABNORMAL LOW (ref 22–32)
Calcium: 9.4 mg/dL (ref 8.9–10.3)
Chloride: 106 mmol/L (ref 101–111)
Creatinine, Ser: 0.82 mg/dL (ref 0.44–1.00)
GFR calc Af Amer: >60 mL/min (ref >60)
GFR calc non Af Amer: >60 mL/min (ref >60)
Glucose, Bld: 120 mg/dL — ABNORMAL HIGH (ref 65–99)
Potassium: 3.2 mmol/L — ABNORMAL LOW (ref 3.5–5.1)
Sodium: 140 mmol/L (ref 135–145)

## 2017-06-23 LAB — CBG MONITORING, ED: Glucose-Capillary: 105 mg/dL — ABNORMAL HIGH (ref 65–99)

## 2017-06-23 LAB — HEPATIC FUNCTION PANEL
ALT: 17 U/L (ref 14–54)
AST: 29 U/L (ref 15–41)
Albumin: 3.9 g/dL (ref 3.5–5.0)
Alkaline Phosphatase: 45 U/L (ref 38–126)
Bilirubin, Direct: <0.1 mg/dL — ABNORMAL LOW (ref 0.1–0.5)
Total Bilirubin: 0.7 mg/dL (ref 0.3–1.2)
Total Protein: 6.7 g/dL (ref 6.5–8.1)

## 2017-06-23 LAB — LIPASE, BLOOD: Lipase: 31 U/L (ref 11–51)

## 2017-06-23 LAB — I-STAT TROPONIN, ED
Troponin i, poc: 0 ng/mL (ref 0.00–0.08)
Troponin i, poc: 0.01 ng/mL (ref 0.00–0.08)

## 2017-06-23 MED ORDER — NITROGLYCERIN 0.4 MG SL SUBL: 0.4000 mg | SUBLINGUAL_TABLET | Freq: Once | SUBLINGUAL | Status: DC

## 2017-06-23 MED ORDER — ONDANSETRON HCL 4 MG/2ML IJ SOLN
4.0000 mg | Freq: Once | INTRAMUSCULAR | Status: AC
  Administered 2017-06-23: 4 mg via INTRAVENOUS
  Filled 2017-06-23: qty 2

## 2017-06-23 MED ORDER — IOPAMIDOL (ISOVUE-300) INJECTION 61%
INTRAVENOUS | Status: AC
  Administered 2017-06-23: 100 mL
  Filled 2017-06-23: qty 100

## 2017-06-23 MED ORDER — SODIUM CHLORIDE 0.9 % IV BOLUS (SEPSIS)
1000.0000 mL | Freq: Once | INTRAVENOUS | Status: AC
  Administered 2017-06-23: 1000 mL via INTRAVENOUS

## 2017-06-23 NOTE — ED Provider Notes (Signed)
[facility] EMERGENCY DEPARTMENT Provider Note   CSN: [id number] Arrival date & time: 06/23/17  0177     History   Chief Complaint Chief Complaint  Patient presents with  . Loss of Consciousness  . Fall  . Chest Pain    HPI Kimberly [name] is a 77 y.o. female with PMH/o Depression, diverticulitis, asthma, atherosclerosis, who presents for evaluation of LOC, fall and chest pain. Per patient's husband, patient went up to go to the bathroom and called out saying she felt sick. Husband reports that patient had a positive of LOC of about 30 seconds.  Patient reports that she felt lightheaded prior to syncopal episode.  She denies any preceding chest pain, dizziness.  Patient reports that since then, she has had a constant 4/10 midsternal chest pressure.  She states it is not worse with deep inspiration.  She has not tried to exert herself since the incident.  Patient did receive 325 mg ASA and 1 sublingual nitro in route which she states improved her chest pain from a 4 to a 2.  On ED arrival, patient is complaining of some nausea.  She states that over the last couple days, she has had worsening GERD.  She states that she will constantly get burning sensation to the midsternal area of her chest and will have coughing.  Patient also reports that over the last 3-4 days, she has had progressively worsening lower abdominal pain.  She states that she does have IBS and does not know if this is worsening of her IBS.  She states that the pain is intermittent.  She has not had any episodes of vomiting or diarrhea since onset of abdominal pain.  Patient denies any fevers, difficulty breathing, numbness/weakness of her arms or legs, vision changes.  Patient denies any history of heart attacks.  The history is provided by the patient.    Past Medical History:  Diagnosis Date  . Allergy   . Anal polyp 1998   Flex Sig   . Anemia   . Angular blepharitis of left eye   . Ankle fracture      Stress fracture  . Anxiety   . Asthma    border line has inhaler  . Bunion   . Cataracts, bilateral   . Chills (without fever)   . Corneal scar    left eye  . Degenerative disc disease   . Depression   . Diverticulosis of colon (without mention of hemorrhage) 2010   Colonoscopy  . Dry eyes    bilateral  . Enterocele   . External hemorrhoids 2000   Colonoscopy  . Family history of malignant neoplasm of gastrointestinal tract   . Female cystocele   . Fibromyalgia   . GERD (gastroesophageal reflux disease)   . GERD (gastroesophageal reflux disease)   . Hiatal hernia 2005,2010   EGD  . History of bronchitis   . History of measles   . History of mumps   . History of strep sore throat   . History of urinary tract infection   . Hyperlipemia   . Hypothyroidism   . IBS (irritable bowel syndrome)   . Imbalance   . Internal hemorrhoids without mention of complication [id number]   Colonoscopy   . Irregular heart beat   . Itching   . Lacunar stroke   . Menopause   . Migraine   . OSA (obstructive sleep apnea) 05/14/2015  . Osteopenia 10/2013   T score -1.6 FRAX 10%/1.6%  .  Pancreatitis   . PCO (posterior capsular opacification)    left  . Pneumonia    childhood illness  . PONV (postoperative nausea and vomiting)   . Pseudophakia, both eyes   . PVD (posterior vitreous detachment) right  . Rash    on back   . Retinal scar    left  . Rotator cuff disorder    pain, left shoulder  . Shingles   . Stricture and stenosis of esophagus 2005,2010   EGD   . Stroke ([place])   . Ulcerative colitis ([place])   . Varicose veins   . Vertigo   . Wears glasses     Patient Active Problem List   Diagnosis Date Noted  . Syncope 05/13/2016  . Heart palpitations 05/13/2016  . IBS (irritable bowel syndrome) 07/14/2015  . Nausea without vomiting 07/14/2015  . OA (osteoarthritis) of knee 06/23/2015  . OSA (obstructive sleep apnea) 05/14/2015  . Preoperative clearance 01/08/2015  .  Pancreatitis 07/22/2013  . Leukocytosis, unspecified 07/22/2013  . 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 Cataracts, bilateral   . Elevated cholesterol   . Degenerative disc disease   . Bunion   . Ankle fracture   . Lacunar stroke   . Hemorrhoid   . DUB (dysfunctional uterine bleeding)   . DIARRHEA 09/16/2009  . PERSONAL HX COLONIC POLYPS 09/16/2009  . LACUNAR INFARCTION 07/16/2008  . CONSTIPATION 07/12/2008  . CHEST PAIN 07/12/2008  . DYSPHAGIA 07/12/2008  . HYPOTHYROIDISM 07/10/2008  . ANXIETY 07/10/2008  . DEPRESSION 07/10/2008  . INTERNAL HEMORRHOIDS 07/10/2008  . ESOPHAGEAL STRICTURE 07/10/2008  . GERD 07/10/2008  . HIATAL HERNIA 07/10/2008  . DIVERTICULOSIS, COLON 07/10/2008  . IRRITABLE BOWEL SYNDROME 07/10/2008  . Osteoarthritis 07/10/2008  . FIBROMYALGIA 07/10/2008    Past Surgical History:  Procedure Laterality Date  . ABDOMINAL HYSTERECTOMY    . ANAL FISSURE REPAIR    . BREAST EXCISIONAL BIOPSY Left   . BUNIONECTOMY  2014  . CATARACT EXTRACTION Bilateral 2013  . COLONOSCOPY     polyp removed  . ESOPHAGEAL DILATION    . EYE SURGERY Left   . FOOT SURGERY     left   . HEMORRHOID SURGERY    . MOUTH SURGERY  11/13/11   Cyst removed from gum-benign   . NASAL SEPTUM SURGERY    . NASAL SEPTUM SURGERY  1970's  . REPLACEMENT TOTAL KNEE Right 2011  . TONSILLECTOMY    . TOTAL ABDOMINAL HYSTERECTOMY W/ BILATERAL SALPINGOOPHORECTOMY  1991   TAH BSO  . TOTAL HIP ARTHROPLASTY     right   . TOTAL KNEE ARTHROPLASTY     both  . TOTAL KNEE ARTHROPLASTY Left 06/23/2015   Procedure: LEFT TOTAL KNEE ARTHROPLASTY;  Surgeon: Gaynelle Arabian, MD;  Location: [facility];  Service: Orthopedics;  Laterality: Left;    OB History    Gravida Para Term Preterm AB Living   0             SAB TAB Ectopic Multiple Live Births                   Home Medications    Prior to Admission medications   Medication Sig Start Date End Date Taking? Authorizing Provider    acetaminophen (TYLENOL) 500 MG tablet Take 500 mg by mouth every 6 (six) hours as needed for moderate pain.    Yes [provider]  aspirin 81 MG tablet Take 81 mg by mouth daily.   Yes [provider]  Calcium Carbonate-Vitamin D (CALTRATE 600+D) 600-400 MG-UNIT tablet Take 1 tablet by mouth 2 (two) times daily.   Yes [provider]  Cholecalciferol (VITAMIN D) 2000 units CAPS Take 1 capsule by mouth daily.   Yes [provider]  docusate sodium (COLACE) 100 MG capsule Take 100 mg by mouth 2 (two) times daily as needed for mild constipation.    Yes [provider]  fluticasone (FLONASE) 50 MCG/ACT nasal spray Place 2 sprays into both nostrils every other day.  06/06/13  Yes [provider]  ibuprofen (ADVIL,MOTRIN) 200 MG tablet Take 200-400 mg by mouth as needed.   Yes [provider]  levothyroxine (SYNTHROID) 100 MCG tablet Take 100 mcg by mouth daily before breakfast.   Yes [provider]  montelukast (SINGULAIR) 10 MG tablet Take 1 tablet by mouth daily as needed.  04/09/15  Yes [provider]  pantoprazole (PROTONIX) 40 MG tablet Take 40 mg by mouth daily.   Yes [provider]  polyethylene glycol powder (GLYCOLAX/MIRALAX) powder Dissolve 9 grams in at least 8 ounces water/juice and drink once daily 08/26/16  Yes Pyrtle, Lajuan Lines, MD  PROAIR HFA 108 [phone] Base) MCG/ACT inhaler TAKE 2 PUFFS AS NEEDED EVERY 4-6 HOURS AS NEEDED COUGH/WHEEZE INHALATION 90 04/09/15  Yes [provider]  Probiotic Product (ALIGN) 4 MG CAPS Take 1 capsule by mouth daily.   Yes [provider]  RESTASIS 0.05 % ophthalmic emulsion Place 1 drop into both eyes 2 (two) times daily. Uses as directed 09/30/12  Yes [provider]  rosuvastatin (CRESTOR) 10 MG tablet TAKE ONE HALF TABLET ONCE A DAY AT BEDTIME ORALLY 90 DAY(S) 03/28/15  Yes [provider]  sodium chloride (OCEAN) 0.65 % SOLN nasal spray  Place 2 sprays into both nostrils as needed for congestion.   Yes [provider]  thiamine (VITAMIN B-1) 100 MG tablet Take 100 mg by mouth daily.   Yes [provider]  EPINEPHrine 0.3 mg/0.3 mL IJ SOAJ injection Inject 0.3 mg into the muscle once. Reported on 05/28/2015    [provider]    Family History Family History  Problem Relation Age of Onset  . Dementia Mother   . Irritable bowel syndrome Mother   . Alzheimer's disease Mother   . Stroke Mother   . Hypertension Father   . Congestive Heart Failure Father   . Alcohol abuse Father   . Diabetes Sister   . Colon cancer Paternal Aunt   . Heart disease Maternal Grandmother   . Heart attack Maternal Grandmother   . Heart attack Paternal Grandfather   . Heart attack Maternal Grandfather   . Arthritis Brother   . Other Sister        pituitary disease  . Allergies Sister   . Other Sister        joint problems  . Esophageal cancer Neg Hx   . Rectal cancer Neg Hx   . Stomach cancer Neg Hx     Social History Social History   Tobacco Use  . Smoking status: Former Smoker    Packs/day: 0.25    Years: 15.00    Pack years: 3.75    Types: Cigarettes    Last attempt to quit: 06/01/1975    Years since quitting: 42.0  . Smokeless tobacco: Never Used  . Tobacco comment: Stopped smoking age 39  Substance Use Topics  . Alcohol use: No    Alcohol/week: 0.0 oz  . Drug use: No  Allergies   Azithromycin; Erythromycin; Almond oil; Amoxicillin-pot clavulanate; Atorvastatin; Codeine; Ketoconazole; Ketoconazole; Latex; Lincomycin; Morphine; Morphine sulfate; Other; Shellfish allergy; Simvastatin; Sulfamethoxazole-trimethoprim; Clindamycin; Clindamycin/lincomycin; and Erythromycin base   Review of Systems Review of Systems  Constitutional: Negative for fever.  Eyes: Negative for visual disturbance.  Respiratory: Negative for cough and shortness of breath.   Cardiovascular: Positive for chest pain.   Gastrointestinal: Positive for abdominal pain and nausea. Negative for vomiting.  Genitourinary: Negative for dysuria and hematuria.  Neurological: Positive for syncope. Negative for weakness, numbness and headaches.  All other systems reviewed and are negative.    Physical Exam Updated Vital Signs BP (!) 155/89   Pulse 87   Temp 97.7 F (36.5 C) (Oral)   Resp 17   Wt 82.6 kg (182 lb)   SpO2 95%   BMI 27.67 kg/m   Physical Exam  Constitutional: She is oriented to person, place, and time. She appears well-developed and well-nourished.  HENT:  Head: Normocephalic.  Mouth/Throat: Oropharynx is clear and moist and mucous membranes are normal.  Diffuse erythema, ecchymosis noted to the nasal bridge.  Diffuse tenderness noted to the face.  Eyes: Conjunctivae, EOM and lids are normal. Pupils are equal, round, and reactive to light.  Neck: Full passive range of motion without pain.  Cardiovascular: Normal rate, regular rhythm, normal heart sounds and normal pulses. Exam reveals no gallop and no friction rub.  No murmur heard. Pulmonary/Chest: Effort normal and breath sounds normal.  Abdominal: Soft. Normal appearance. There is tenderness in the suprapubic area. There is no rigidity and no guarding.  Musculoskeletal: Normal range of motion.  Neurological: She is alert and oriented to person, place, and time.  Cranial nerves III-XII intact Follows commands, Moves all extremities  5/5 strength to BUE and BLE  Sensation intact throughout all major nerve distributions Normal finger to nose. No dysdiadochokinesia. No pronator drift. No slurred speech. No facial droop.   Skin: Skin is warm and dry. Capillary refill takes less than 2 seconds.  Psychiatric: She has a normal mood and affect. Her speech is normal.  Nursing note and vitals reviewed.    ED Treatments / Results  Labs (all labs ordered are listed, but only abnormal results are displayed) Labs Reviewed  BASIC METABOLIC  PANEL - Abnormal; Notable for the following components:      Result Value   Potassium 3.2 (*)    CO2 20 (*)    Glucose, Bld 120 (*)    All other components within normal limits  CBC - Abnormal; Notable for the following components:   WBC 11.4 (*)    All other components within normal limits  URINALYSIS, ROUTINE W REFLEX MICROSCOPIC - Abnormal; Notable for the following components:   Color, Urine STRAW (*)    Hgb urine dipstick SMALL (*)    Squamous Epithelial / LPF 0-5 (*)    All other components within normal limits  HEPATIC FUNCTION PANEL - Abnormal; Notable for the following components:   Bilirubin, Direct <0.1 (*)    All other components within normal limits  CBG MONITORING, ED - Abnormal; Notable for the following components:   Glucose-Capillary 105 (*)    All other components within normal limits  LIPASE, BLOOD  I-STAT TROPONIN, ED  I-STAT TROPONIN, ED    EKG  EKG Interpretation  Date/Time:  Thursday June 23 2017 05:47:31 EST Ventricular Rate:  72 PR Interval:    QRS Duration: 156 QT Interval:  463 QTC Calculation: 507 R Axis:   -  51 Text Interpretation:  Sinus rhythm RBBB and LAFB Probable left ventricular hypertrophy No significant change since last tracing Confirmed by Ward, Cyril Mourning [phone]) on 06/23/2017 5:57:40 AM Also confirmed by Ward, Cyril Mourning [phone]), editor Hattie Perch ([id number])  on 06/23/2017 7:09:05 AM       Radiology Dg Chest 2 View  Result Date: 06/23/2017 CLINICAL DATA:  Chest pain and syncope. EXAM: CHEST  2 VIEW COMPARISON:  03/25/2017 FINDINGS: Normal heart size and pulmonary vascularity. No focal airspace disease or consolidation in the lungs. No blunting of costophrenic angles. No pneumothorax. Mediastinal contours appear intact. Calcification of the aorta. Degenerative changes in the spine. IMPRESSION: No evidence of active pulmonary disease.  Aortic atherosclerosis. Electronically Signed   By: Lucienne Capers M.D.   On: 06/23/2017 06:29    Ct Head Wo Contrast  Result Date: 06/23/2017 CLINICAL DATA:  Syncopal episode. Patient struck face on nightstand. Nose and mouth swelling. Neck pain. Nausea. EXAM: CT HEAD WITHOUT CONTRAST CT MAXILLOFACIAL WITHOUT CONTRAST CT CERVICAL SPINE WITHOUT CONTRAST TECHNIQUE: Multidetector CT imaging of the head, cervical spine, and maxillofacial structures were performed using the standard protocol without intravenous contrast. Multiplanar CT image reconstructions of the cervical spine and maxillofacial structures were also generated. COMPARISON:  CT head and cervical spine 01/18/2016 FINDINGS: CT HEAD FINDINGS Brain: No evidence of acute infarction, hemorrhage, hydrocephalus, extra-axial collection or mass lesion/mass effect. Vascular: No hyperdense vessel or unexpected calcification. Skull: Calvarium appears intact. Other: None. CT MAXILLOFACIAL FINDINGS Osseous: No fracture or mandibular dislocation. No destructive process. Orbits: Negative. No traumatic or inflammatory finding. Sinuses: Clear. Soft tissues: Negative. CT CERVICAL SPINE FINDINGS Alignment: Normal. Skull base and vertebrae: No acute fracture. No primary bone lesion or focal pathologic process. Bilateral cervical ribs. Soft tissues and spinal canal: No prevertebral fluid or swelling. No visible canal hematoma. Disc levels: Degenerative changes throughout the cervical spine with narrowed interspaces and endplate hypertrophic changes. Degenerative changes are most prominent at C4-5, C5-6, and C6-7 levels. Degenerative changes in the facet joints. Degenerative changes at C1-2. Upper chest: Motion artifact limits examination.  Grossly clear. Other: None. IMPRESSION: 1. No acute intracranial abnormalities. 2. No acute orbital or facial fractures. 3. Normal alignment of the cervical spine. Diffuse degenerative changes. No acute displaced fractures identified. Electronically Signed   By: Lucienne Capers M.D.   On: 06/23/2017 06:49   Ct Cervical Spine Wo  Contrast  Result Date: 06/23/2017 CLINICAL DATA:  Syncopal episode. Patient struck face on nightstand. Nose and mouth swelling. Neck pain. Nausea. EXAM: CT HEAD WITHOUT CONTRAST CT MAXILLOFACIAL WITHOUT CONTRAST CT CERVICAL SPINE WITHOUT CONTRAST TECHNIQUE: Multidetector CT imaging of the head, cervical spine, and maxillofacial structures were performed using the standard protocol without intravenous contrast. Multiplanar CT image reconstructions of the cervical spine and maxillofacial structures were also generated. COMPARISON:  CT head and cervical spine 01/18/2016 FINDINGS: CT HEAD FINDINGS Brain: No evidence of acute infarction, hemorrhage, hydrocephalus, extra-axial collection or mass lesion/mass effect. Vascular: No hyperdense vessel or unexpected calcification. Skull: Calvarium appears intact. Other: None. CT MAXILLOFACIAL FINDINGS Osseous: No fracture or mandibular dislocation. No destructive process. Orbits: Negative. No traumatic or inflammatory finding. Sinuses: Clear. Soft tissues: Negative. CT CERVICAL SPINE FINDINGS Alignment: Normal. Skull base and vertebrae: No acute fracture. No primary bone lesion or focal pathologic process. Bilateral cervical ribs. Soft tissues and spinal canal: No prevertebral fluid or swelling. No visible canal hematoma. Disc levels: Degenerative changes throughout the cervical spine with narrowed interspaces and endplate hypertrophic changes. Degenerative changes are most prominent at  C4-5, C5-6, and C6-7 levels. Degenerative changes in the facet joints. Degenerative changes at C1-2. Upper chest: Motion artifact limits examination.  Grossly clear. Other: None. IMPRESSION: 1. No acute intracranial abnormalities. 2. No acute orbital or facial fractures. 3. Normal alignment of the cervical spine. Diffuse degenerative changes. No acute displaced fractures identified. Electronically Signed   By: Lucienne Capers M.D.   On: 06/23/2017 06:49   Ct Abdomen Pelvis W  Contrast  Result Date: 06/23/2017 CLINICAL DATA:  Nausea and vomiting. EXAM: CT ABDOMEN AND PELVIS WITH CONTRAST TECHNIQUE: Multidetector CT imaging of the abdomen and pelvis was performed using the standard protocol following bolus administration of intravenous contrast. CONTRAST:  <See Chart> ISOVUE-300 IOPAMIDOL (ISOVUE-300) INJECTION 61% COMPARISON:  09/01/2016 FINDINGS: Lower chest: Atelectasis noted in the posterior lung bases. Hepatobiliary: No focal abnormality identified in the liver. There is no evidence for gallstones, gallbladder wall thickening, or pericholecystic fluid. No intrahepatic or extrahepatic biliary dilation. Pancreas: No focal mass lesion. No dilatation of the main duct. No intraparenchymal cyst. No peripancreatic edema. Spleen: No splenomegaly. No focal mass lesion. Adrenals/Urinary Tract: No adrenal nodule or mass. Kidneys unremarkable. No evidence for hydroureter. The urinary bladder appears normal for the degree of distention. Stomach/Bowel: Stomach is nondistended. No gastric wall thickening. No evidence of outlet obstruction. Duodenum is normally positioned as is the ligament of Treitz. No small bowel wall thickening. No small bowel dilatation. The terminal ileum is normal. The appendix is normal. No gross colonic mass. No colonic wall thickening. No substantial diverticular change. Vascular/Lymphatic: There is abdominal aortic atherosclerosis without aneurysm. There is no gastrohepatic or hepatoduodenal ligament lymphadenopathy. No intraperitoneal or retroperitoneal lymphadenopathy. No pelvic sidewall lymphadenopathy. Reproductive: Uterus surgically absent.  There is no adnexal mass. Other: No intraperitoneal free fluid. Musculoskeletal: Bone windows reveal no worrisome lytic or sclerotic osseous lesions. IMPRESSION: 1. No acute findings in the abdomen or pelvis. Specifically, no findings to explain the patient's history of nausea and vomiting. 2.  Aortic Atherosclerois (ICD10-170.0)  3. Stable appearance mild L1 superior endplate compression deformity. 4. Contrast extravasation during power injection for CT scanning. Patient was examined and assessed by the Radiology PA with appropriate documentation and follow-up planned. Electronically Signed   By: Misty Stanley M.D.   On: 06/23/2017 12:52   Ct Maxillofacial Wo Contrast  Result Date: 06/23/2017 CLINICAL DATA:  Syncopal episode. Patient struck face on nightstand. Nose and mouth swelling. Neck pain. Nausea. EXAM: CT HEAD WITHOUT CONTRAST CT MAXILLOFACIAL WITHOUT CONTRAST CT CERVICAL SPINE WITHOUT CONTRAST TECHNIQUE: Multidetector CT imaging of the head, cervical spine, and maxillofacial structures were performed using the standard protocol without intravenous contrast. Multiplanar CT image reconstructions of the cervical spine and maxillofacial structures were also generated. COMPARISON:  CT head and cervical spine 01/18/2016 FINDINGS: CT HEAD FINDINGS Brain: No evidence of acute infarction, hemorrhage, hydrocephalus, extra-axial collection or mass lesion/mass effect. Vascular: No hyperdense vessel or unexpected calcification. Skull: Calvarium appears intact. Other: None. CT MAXILLOFACIAL FINDINGS Osseous: No fracture or mandibular dislocation. No destructive process. Orbits: Negative. No traumatic or inflammatory finding. Sinuses: Clear. Soft tissues: Negative. CT CERVICAL SPINE FINDINGS Alignment: Normal. Skull base and vertebrae: No acute fracture. No primary bone lesion or focal pathologic process. Bilateral cervical ribs. Soft tissues and spinal canal: No prevertebral fluid or swelling. No visible canal hematoma. Disc levels: Degenerative changes throughout the cervical spine with narrowed interspaces and endplate hypertrophic changes. Degenerative changes are most prominent at C4-5, C5-6, and C6-7 levels. Degenerative changes in the facet joints. Degenerative changes at  C1-2. Upper chest: Motion artifact limits examination.  Grossly  clear. Other: None. IMPRESSION: 1. No acute intracranial abnormalities. 2. No acute orbital or facial fractures. 3. Normal alignment of the cervical spine. Diffuse degenerative changes. No acute displaced fractures identified. Electronically Signed   By: Lucienne Capers M.D.   On: 06/23/2017 06:49    Procedures Procedures (including critical care time)  Medications Ordered in ED Medications  sodium chloride 0.9 % bolus 1,000 mL (0 mLs Intravenous Stopped 06/23/17 1402)  ondansetron (ZOFRAN) injection 4 mg (4 mg Intravenous Given 06/23/17 0841)  iopamidol (ISOVUE-300) 61 % injection (100 mLs  Contrast Given 06/23/17 1131)     Initial Impression / Assessment and Plan / ED Course  I have reviewed the triage vital signs and the nursing notes.  Pertinent labs & imaging results that were available during my care of the patient were reviewed by me and considered in my medical decision making (see chart for details).     77 y.o. F with PMH/o Depression, diverticulitis, asthma, atherosclerosis, who presents for evaluation of syncopal episode that occurred this morning.  Patient got up going to the bathroom, started feeling lightheaded and had a syncopal episode.  Patient reports that she is having some midsternal chest pressure.  Associated with some nausea.  No diaphoresis, no difficulty breathing.  Patient also reporting some abdominal pain over the last few days. Patient is afebrile, non-toxic appearing, sitting comfortably on examination table. Vital signs reviewed and stable. Physical exam shows no neuro deficits.  Patient does have diffuse tenderness overlying the face.  Labs and imaging ordered at initial triage evaluation.  Patient given Zofran for nausea.  Consider ACS etiology versus acute infectious etiology versus dehydration versus orthostatic hypotension.  Patient reports that she had a similar episode of symptoms last year.  Review of records show that she had a syncopal episode in  December 2017.  At that time workup was unremarkable.  She was referred back to her cardiologist (Dr. Radford Pax).  Echo was performed that showed normal EF.  I-STAT troponin negative.  CBC shows slight leukocytosis of 11.4.  CT head is without any acute abnormality.  CT max of facial shows no orbital facial fractures.  CT C-spine is negative for any acute abnormality.   Reevaluation.  Patient reports improvement in nausea.  Vitals stable.  Plan to repeat troponin.  CT abdomen pelvis pending.   Repeat troponin is negative.  Patient signed out to [facility], PA-C with abd/pelvis pending. If negative plan to ambulate patient and and re-evaluation. If able to ambulate and is stable, can be reasonably discharged with outpatient cardiology follow-up.   Orthostatic VS for the past 24 hrs:  BP- Lying Pulse- Lying BP- Sitting Pulse- Sitting BP- Standing at 0 minutes Pulse- Standing at 0 minutes  06/23/17 0859 159/79 74 (!) 154/106 81 139/84 87       Final Clinical Impressions(s) / ED Diagnoses   Final diagnoses:  Syncope and collapse    ED Discharge Orders    None       Desma Mcgregor 06/23/17 2215    Drenda Freeze, MD 06/24/17 [phone]

## 2017-06-23 NOTE — Telephone Encounter (Signed)
Please order an event monitor and have her followup with extender next week.  Please see if event monitor can be placed tomorrow

## 2017-06-23 NOTE — ED Provider Notes (Signed)
Received patient at signout from [facility].  refer to provider note for full history and physical examination.  Briefly, patient is a 77 year old female with history of depression, diverticulitis, asthma, and atherosclerosis who presents today for evaluation after syncopal episode.  Also endorsed chest pain.  All of her symptoms have resolved while in the ED with nitroglycerin and Zofran.  She also complained of generalized abdominal pain for the past few days but thinks this may be related to her IBS.  Lab work and imaging thus far has been reassuring.  Awaiting CT scan of the abdomen.  If CT scan shows no acute changes and patient is ambulatory without difficulty she may be discharged home with outpatient cardiology follow-up with Dr. Radford Pax her cardiologist. Physical Exam  BP (!) 162/83   Pulse 79   Temp 97.7 F (36.5 C) (Oral)   Resp 18   Wt 82.6 kg (182 lb)   SpO2 98%   BMI 27.67 kg/m   Physical Exam  Constitutional: She appears well-developed and well-nourished. No distress.  HENT:  Head: Normocephalic and atraumatic.  Eyes: Conjunctivae are normal. Right eye exhibits no discharge. Left eye exhibits no discharge.  Neck: Normal range of motion. Neck supple. No JVD present. No tracheal deviation present.  Cardiovascular: Normal rate and regular rhythm.  Pulses:      Radial pulses are 2+ on the right side, and 2+ on the left side.  Pulmonary/Chest: Effort normal.  Abdominal: Soft. Bowel sounds are normal. She exhibits no distension. There is no tenderness.  Musculoskeletal: She exhibits no edema.  Small amount of swelling and superficial small hemorrhages of the skin consistent with IV extravasation of the right AC.  Mild tenderness to palpation of this area.  5/5 strength of BUE and BLE major muscle groups  Neurological: She is alert.  Fluent speech, no facial droop, ambulatory with steady gait and normal balance.  Skin: Skin is warm and dry. No erythema.  Psychiatric: She has a  normal mood and affect. Her behavior is normal.  Nursing note and vitals reviewed.   ED Course/Procedures     Procedures  MDM  Impression: 1. No acute findings in the abdomen or pelvis. Specifically, no findings to explain the patient's history of nausea and vomiting. 2.  Aortic Atherosclerois (ICD10-170.0) 3. Stable appearance mild L1 superior endplate compression deformity. 4. Contrast extravasation during power injection for CT scanning. Patient was examined and assessed by the Radiology PA with appropriate documentation and follow-up planned.  Patient with no acute findings on imaging.  She is resting comfortably.  She has been advised of appropriate observation and return precautions regarding contrast extravasation which occurred during the CT scan of the abdomen.  Radiology PA Saverio Danker spoke with the patient extensively.  Patient is otherwise resting comfortably in no apparent distress.  She is ambulatory without difficulty.  She has no complaints prior to discharge.  She will follow-up with Dr. Radford Pax her cardiologist.  Discussed indications for return to the ED.  Patient and patient's husband verbalized understanding of and agreement with this plan and patient is stable for discharge at this time.        Renita Papa, PA-C 06/23/17 1530    Drenda Freeze, MD 06/24/17 [id number]

## 2017-06-23 NOTE — ED Triage Notes (Signed)
Patient got up from bed thinking that she had to go to the bathroom.  Patient states that she had a syncopal episode and hit her face on the night stand or floor.  She is incontinent of urine, she has swelling to nose and mouth is bleeding and some neck pain. She does have some nausea with movement.  Patient was initially hypotensive, given 460ml of fluid en route to ED with increase of BP.  Patient was given 324mg  ASA and one sl nitro, which brought her CP from 4/10 to 2/10.

## 2017-06-23 NOTE — Telephone Encounter (Signed)
Patient also states she was transported to the hospital this morning because she fainted at 3 am this morning and she was told by the doctor to see Dr Radford Pax your cardiologist within 3 days in reference to syncope episode and Dr Radford Pax should check her thyroid level and order a  halter monitor.  There is no room on your schedule for tomorrow and that will be past the three days they recommended.

## 2017-06-23 NOTE — ED Notes (Signed)
Patient transported to CT 

## 2017-06-23 NOTE — Telephone Encounter (Signed)
Sleep study never came to me.  Was this to get a new CPAP machine.  I just saw her and no mention of any problems.

## 2017-06-23 NOTE — Telephone Encounter (Signed)
Patient states you ordered a home sleep study to see where she was because she was borderline sleep apnea. You were to discuss the test with her.  Home sleep study is in Epic, under media 06/07/2017.

## 2017-06-23 NOTE — ED Notes (Signed)
Pt elevating R arm above level of the heart. Pt with heat pack applied.

## 2017-06-23 NOTE — Progress Notes (Signed)
Called to see patient after 100cc of contrast extravasation with CT abd/Pel.  Patient already has a heat pack on and her arm elevated.  Her right arm is edematous, but still soft.  It is tender, but not significantly so.  There is no evidence of skin necrosis, blistering, compartment syndrome, etc.  She has a +2 radial pulse on the right side.  We will change her heat pack to ice for at least the first 24 hrs along with elevation.  After that she can alternate between heat or ice, whichever is most comforting for her.  She is aware of signs to look out for at home such as significant increase in swelling/tightness, numbness or tingling in her forearm/hand, blistering of her skin, or skin necrosis.  If any of these develop she should return to the ED.  Her and her husband understand.  Instructions have been written in the DC instruction section of her paperwork and one of the IR PAs will contact her tomorrow to check on her.    Henreitta Cea 1:56 PM 06/23/2017

## 2017-06-23 NOTE — ED Notes (Signed)
Pt was able to ambulate independently. Gait stead/even.

## 2017-06-23 NOTE — ED Notes (Signed)
Pt returned from CT °

## 2017-06-23 NOTE — Telephone Encounter (Signed)
Patient just got out of hospital needs an appointment in 3 days with Dr Radford Pax please call patient.

## 2017-06-23 NOTE — ED Notes (Signed)
Pt still in CT/xray. 

## 2017-06-23 NOTE — ED Notes (Signed)
Pt denies CP/epigastric pain at this time, c/o leg cramping.

## 2017-06-23 NOTE — Discharge Instructions (Addendum)
Alternate ibuprofen and Tylenol as needed every 3-6 hours for pain.  Apply ice for the next 2-3 days for comfort. You may alternate between heat and ice after the first 24hrs for your most comfort. Observe the area for skin tightness, color changes of blue/black/purple, weakness, blisters, numbness or tingling of your forearm or hand, or fevers.  Follow-up with your cardiologist for reevaluation of your syncopal episode.  They may want to check your thyroid levels or apply a Holter monitor.  Drink plenty of fluids and get plenty of rest.  Return to the emergency department if any concerning signs or symptoms develop.

## 2017-06-24 ENCOUNTER — Telehealth: Payer: Self-pay | Admitting: Radiology

## 2017-06-24 NOTE — Telephone Encounter (Signed)
Returned call to patient. Informed patient of Dr. Theodosia Blender recommendation for event monitor. Patient in agreement with plan. Patient scheduled 06/27/17 at 11AM for event monitor( first available) and scheduled to see Ermalinda Barrios, PA 06/27/17 at 12. Patient verbalized understanding and thanked me for the call.

## 2017-06-24 NOTE — Progress Notes (Signed)
   Pt suffered 100 cc contrast extravasation yesterday in CT Her right arm site was examined by Dr Maree Erie immediatly and later by Saverio Danker [facility]   Discharged from ED later in day.  Called pt today to check on her arm She states she has kept arm elevated with heat then ice this am  Arm site is less swollen; less tight Less painful No bleeding No blisters No skin changes  She does notice some areas she calls 'Lines " at site She attributes these to what appears to be like remnants of where skin was inflamed and now less so.  She knows to continue ice and elevation for another 24 hrs or so Watch for skin changes or recurrence of swelling/redness  To call or go to ED with any problems  She has good understanding of plan

## 2017-06-27 ENCOUNTER — Ambulatory Visit (INDEPENDENT_AMBULATORY_CARE_PROVIDER_SITE_OTHER): Payer: Medicare Other

## 2017-06-27 ENCOUNTER — Encounter: Payer: Self-pay | Admitting: Physician Assistant

## 2017-06-27 ENCOUNTER — Ambulatory Visit: Payer: Medicare Other | Admitting: Physician Assistant

## 2017-06-27 VITALS — BP 140/88 | HR 61 | Ht 68.0 in | Wt 187.0 lb

## 2017-06-27 DIAGNOSIS — R55 Syncope and collapse: Secondary | ICD-10-CM | POA: Diagnosis not present

## 2017-06-27 DIAGNOSIS — K219 Gastro-esophageal reflux disease without esophagitis: Secondary | ICD-10-CM

## 2017-06-27 DIAGNOSIS — G4733 Obstructive sleep apnea (adult) (pediatric): Secondary | ICD-10-CM

## 2017-06-27 NOTE — Patient Instructions (Addendum)
Medication Instructions:  Your physician recommends that you continue on your current medications as directed. Please refer to the Current Medication list given to you today.   Labwork: TODAY:  BMET  Testing/Procedures: Your physician has requested that you have a lexiscan myoview. For further information please visit [email or web address]. Please follow instruction sheet, as given.    Follow-Up: Your physician recommends that you schedule a follow-up appointment in: [place] DR. Radford Pax, ONLY, PER MICHEL LENZE, PA-C   Any Other Special Instructions Will Be Listed Below (If Applicable).  MUGA Scan A MUGA scan (multigated acquisition scan) is a test that makes pictures of your heart at specific times while it beats. The test uses a radioactive dye (tracer) that is injected into your bloodstream. The dye makes it possible for your health care provider to see the red blood cells passing through your heart. A MUGA scan shows:  How much blood your heart is pumping out with each heartbeat.  How well your ventricles are working. Ventricles are chambers in the lower part of your heart. They pump blood to your lungs and to the rest of your body.  A MUGA scan may be done to determine:  What is causing heart symptoms, such as chest pain.  If the arteries that supply your heart are blocked.  If you have heart valve disease.  If your heart is damaged from a heart attack.  If your heart has trouble pumping blood.  Tell a health care provider about:  Any allergies you have.  All medicines you are taking, including vitamins, herbs, eye drops, creams, and over-the-counter medicines.  Any blood disorders you have.  Any surgeries you have had.  Any medical conditions you have.  If you are pregnant, if you might be pregnant, or if you are breastfeeding. This is very important. What are the risks? Generally this is a safe procedure. However, problems can occur and include:  An  allergic reaction to the tracer. This side effect is rare.  Pain and redness at the IV site.  Potential harm to your baby if you are pregnant or breastfeeding.  What happens before the procedure?  Do not take your regular medicines before your procedure if your health care provider asks you not to. Ask your health care provider about changing or stopping those medicines.  Do not drink any beverages that have caffeine for several hours before the scan. Beverages containing caffeine include coffee, tea, and soft drinks.  Ask your health care provider if you will be having an exercise scan with your MUGA scan.  If you will be having an exercise scan with your MUGA scan, do not eat or drink anything except water for 4 hours before the scan.  Wear loose, comfortable clothing. What happens during the procedure?  You will be asked to lie down on an exam table.  An IV tube will be put into one of your veins. Medicine will flow through the tube during the procedure.  Discs called electrodes will be attached to your chest, arms, and legs. These will be connected with wires to a machine.  A small amount of tracer will be injected through your IV. You may feel a cold sensation in your arm as it runs through your IV.  A camera will be placed over your chest. It will take a series of pictures while you rest.  If you need to have an exercise scan, you will walk on a treadmill or ride a stationary bicycle. Then you  will be asked to lie back down on the exam table for more pictures.  After all of the pictures have been taken, your IV tube will be removed. What happens after the procedure?  Drink enough fluid to keep your urine clear or pale yellow. This helps to flush the tracer out of your body.  It is your responsibility to get your test results. Ask the lab or department performing the test when and how you will get your results.  Keep all follow-up visits as directed by your health care  provider. This is important. This information is not intended to replace advice given to you by your health care provider. Make sure you discuss any questions you have with your health care provider. Document Released: 07/06/2005 Document Revised: 10/23/2015 Document Reviewed: 10/04/2013 Elsevier Interactive Patient Education  [employer].    If you need a refill on your cardiac medications before your next appointment, please call your pharmacy.

## 2017-06-27 NOTE — Progress Notes (Signed)
Cardiology Office Note    Date:  06/27/2017   ID:  Kimberly [name], DOB 1940-06-19, MRN [id number]  PCP:  Lajean Manes, MD  Cardiologist: Fransico Him, MD  No chief complaint on file.   History of Present Illness:  Kimberly [name] is a 77 y.o. female with history of moderate OSA on CPAP, asthma, and depression normal stress Myoview in 2014.  Patient was in the ER 06/23/17 with syncope.  She got up to go to the bathroom at 3 AM and lost consciousness for about 30 seconds.  She then had chest pain afterwards that resolved with nitroglycerin and aspirin.  Troponins were negative x2 and CT of the head was unremarkable. Wasn't orthostatic. Prior to this she has had worsening GERD.  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  She had a similar episode 04/2016 that was felt to be vasovagal.  Echo at that time was normal.  Palpitations were related to caffeine intake at that time.  Dr. Radford Pax ordered an event monitor which was placed today.  Patient said she was sitting on the side of the bed-felt dizzy and then passed out. 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  Her potassium was low at 3.2.  She has not had any more syncope.  She denies chest tightness.  She is having trouble with GERD but it is now improved on Protonix.  Past Medical History:  Diagnosis Date  . Allergy   . Anal polyp 1998   Flex Sig   . Anemia   . Angular blepharitis of left eye   . Ankle fracture    Stress fracture  . Anxiety   . Asthma    border line has inhaler  . Bunion   . Cataracts, bilateral   . Chills (without fever)   . Corneal scar    left eye  . Degenerative disc disease   . Depression   . Diverticulosis of colon (without mention of hemorrhage) 2010   Colonoscopy  . Dry eyes    bilateral  . Enterocele   . External hemorrhoids 2000   Colonoscopy  . Family history of malignant neoplasm of gastrointestinal tract   .  Female cystocele   . Fibromyalgia   . GERD (gastroesophageal reflux disease)   . GERD (gastroesophageal reflux disease)   . Hiatal hernia 2005,2010   EGD  . History of bronchitis   . History of measles   . History of mumps   . History of strep sore throat   . History of urinary tract infection   . Hyperlipemia   . Hypothyroidism   . IBS (irritable bowel syndrome)   . Imbalance   . Internal hemorrhoids without mention of complication 0109,3235   Colonoscopy   . Irregular heart beat   . Itching   . Lacunar stroke   . Menopause   . Migraine   . OSA (obstructive sleep apnea) 05/14/2015  . Osteopenia 10/2013   T score -1.6 FRAX 10%/1.6%  . Pancreatitis   . PCO (posterior capsular opacification)    left  . Pneumonia    childhood illness  . PONV (postoperative nausea and vomiting)   . Pseudophakia, both eyes   . PVD (posterior vitreous detachment) right  . Rash    on back   . Retinal scar    left  . Rotator cuff disorder    pain, left shoulder  .  Shingles   . Stricture and stenosis of esophagus 2005,2010   EGD   . Stroke ([place])   . Ulcerative colitis ([place])   . Varicose veins   . Vertigo   . Wears glasses     Past Surgical History:  Procedure Laterality Date  . ABDOMINAL HYSTERECTOMY    . ANAL FISSURE REPAIR    . BREAST EXCISIONAL BIOPSY Left   . BUNIONECTOMY  2014  . CATARACT EXTRACTION Bilateral 2013  . COLONOSCOPY     polyp removed  . ESOPHAGEAL DILATION    . EYE SURGERY Left   . FOOT SURGERY     left   . HEMORRHOID SURGERY    . MOUTH SURGERY  11/13/11   Cyst removed from gum-benign   . NASAL SEPTUM SURGERY    . NASAL SEPTUM SURGERY  1970's  . REPLACEMENT TOTAL KNEE Right 2011  . TONSILLECTOMY    . TOTAL ABDOMINAL HYSTERECTOMY W/ BILATERAL SALPINGOOPHORECTOMY  1991   TAH BSO  . TOTAL HIP ARTHROPLASTY     right   . TOTAL KNEE ARTHROPLASTY     both  . TOTAL KNEE ARTHROPLASTY Left 06/23/2015   Procedure: LEFT TOTAL KNEE ARTHROPLASTY;  Surgeon: Gaynelle Arabian, MD;  Location: [facility];  Service: Orthopedics;  Laterality: Left;    Current Medications: Current Meds  Medication Sig  . acetaminophen (TYLENOL) 500 MG tablet Take 500 mg by mouth every 6 (six) hours as needed for moderate pain.   Marland Kitchen aspirin 81 MG tablet Take 81 mg by mouth daily.  . Calcium Carbonate-Vitamin D (CALTRATE 600+D) 600-400 MG-UNIT tablet Take 1 tablet by mouth 2 (two) times daily.  . Cholecalciferol (VITAMIN D) 2000 units CAPS Take 1 capsule by mouth daily.  Marland Kitchen docusate sodium (COLACE) 100 MG capsule Take 100 mg by mouth 2 (two) times daily as needed for mild constipation.   Marland Kitchen EPINEPHrine 0.3 mg/0.3 mL IJ SOAJ injection Inject 0.3 mg into the muscle once. Reported on 05/28/2015  . fluticasone (FLONASE) 50 MCG/ACT nasal spray Place 2 sprays into both nostrils every other day.   . ibuprofen (ADVIL,MOTRIN) 200 MG tablet Take 200-400 mg by mouth as needed.  Marland Kitchen levothyroxine (SYNTHROID) 100 MCG tablet Take 100 mcg by mouth daily before breakfast.  . montelukast (SINGULAIR) 10 MG tablet Take 1 tablet by mouth daily as needed.   . pantoprazole (PROTONIX) 40 MG tablet Take 40 mg by mouth daily.  . polyethylene glycol powder (GLYCOLAX/MIRALAX) powder Dissolve 9 grams in at least 8 ounces water/juice and drink once daily  . PROAIR HFA 108 (90 Base) MCG/ACT inhaler TAKE 2 PUFFS AS NEEDED EVERY 4-6 HOURS AS NEEDED COUGH/WHEEZE INHALATION 90  . Probiotic Product (ALIGN) 4 MG CAPS Take 1 capsule by mouth daily.  . RESTASIS 0.05 % ophthalmic emulsion Place 1 drop into both eyes 2 (two) times daily. Uses as directed  . rosuvastatin (CRESTOR) 10 MG tablet TAKE ONE HALF TABLET ONCE A DAY AT BEDTIME ORALLY 90 DAY(S)  . sodium chloride (OCEAN) 0.65 % SOLN nasal spray Place 2 sprays into both nostrils as needed for congestion.  . thiamine (VITAMIN B-1) 100 MG tablet Take 100 mg by mouth daily.     Allergies:   Azithromycin; Erythromycin; Almond oil; Amoxicillin-pot clavulanate; Atorvastatin;  Codeine; Ketoconazole; Ketoconazole; Latex; Lincomycin; Morphine; Morphine sulfate; Other; Shellfish allergy; Simvastatin; Sulfamethoxazole-trimethoprim; Clindamycin; Clindamycin/lincomycin; and Erythromycin base   Social History   Socioeconomic History  . Marital status: Married    Spouse name: None  . Number  of children: 1  . Years of education: bus.school  . Highest education level: None  Social Needs  . Financial resource strain: None  . Food insecurity - worry: None  . Food insecurity - inability: None  . Transportation needs - medical: None  . Transportation needs - non-medical: None  Occupational History  . Occupation: Retired    Fish farm manager: RETIRED  Tobacco Use  . Smoking status: Former Smoker    Packs/day: 0.25    Years: 15.00    Pack years: 3.75    Types: Cigarettes    Last attempt to quit: 06/01/1975    Years since quitting: 42.1  . Smokeless tobacco: Never Used  . Tobacco comment: Stopped smoking age 71  Substance and Sexual Activity  . Alcohol use: No    Alcohol/week: 0.0 oz  . Drug use: No  . Sexual activity: No    Birth control/protection: Surgical    Comment: HYST, intecourse age unknown, sexual partner less than 5  Other Topics Concern  . None  Social History Narrative   Daily caffeine      Family History:  The patient's family history includes Alcohol abuse in her father; Allergies in her sister; Alzheimer's disease in her mother; Arthritis in her brother; Colon cancer in her paternal aunt; Congestive Heart Failure in her father; Dementia in her mother; Diabetes in her sister; Heart attack in her maternal grandfather, maternal grandmother, and paternal grandfather; Heart disease in her maternal grandmother; Hypertension in her father; Irritable bowel syndrome in her mother; Other in her sister and sister; Stroke in her mother.   ROS:   Please see the history of present illness.    Review of Systems  Constitution: Positive for weakness.  HENT: Negative.     Eyes: Negative.   Cardiovascular: Positive for syncope.  Respiratory: Negative.   Hematologic/Lymphatic: Negative.   Musculoskeletal: Positive for arthritis and back pain. Negative for joint pain.  Gastrointestinal: Positive for heartburn.  Genitourinary: Negative.    All other systems reviewed and are negative.   PHYSICAL EXAM:   VS:  BP 140/88   Pulse 61   Ht _0  (1.727 m)   Wt 187 lb (84.8 kg)   SpO2 97%   BMI 28.43 kg/m   Physical Exam  GEN: Well nourished, well developed, in no acute distress  Neck: no JVD, carotid bruits, or masses Cardiac:RRR; mid systolic click, 1/6 systolic murmur at the left sternal border Respiratory:  clear to auscultation bilaterally, normal work of breathing GI: soft, nontender, nondistended, + BS Ext: without cyanosis, clubbing, or edema, Good distal pulses bilaterally Neuro:  Alert and Oriented x 3 Psych: euthymic mood, full affect  Wt Readings from Last 3 Encounters:  06/27/17 187 lb (84.8 kg)  06/23/17 182 lb (82.6 kg)  05/13/17 189 lb 3.2 oz (85.8 kg)      Studies/Labs Reviewed:   EKG:  EKG is ordered today.  The ekg ordered today demonstrates normal sinus rhythm with RBBB and LAFB new from prior EKG  Recent Labs: 06/23/2017: ALT 17; BUN 14; Creatinine, Ser 0.82; Hemoglobin 13.3; Platelets 214; Potassium 3.2; Sodium 140   Lipid Panel    Component Value Date/Time   CHOL 124 07/23/2013 0632   TRIG 69 07/23/2013 0632   HDL 68 07/23/2013 0632   CHOLHDL 1.8 07/23/2013 0632   VLDL 14 07/23/2013 0632   LDLCALC 42 07/23/2013 7494    Additional studies/ records that were reviewed today include:  2D echo 1/9/18Study Conclusions   - Left  ventricle: The cavity size was normal. Wall thickness was   normal. Systolic function was normal. The estimated ejection   fraction was in the range of 60% to 65%. Wall motion was normal;   there were no regional wall motion abnormalities. Doppler   parameters are consistent with pseudonormal  left ventricular   relaxation (grade 2 diastolic dysfunction). The E/A ratio is   >1.5. The E/e&' ratio is >15, suggesting elevated LV filling   pressure. - Mitral valve: Mildly thickened leaflets . There was trivial   regurgitation. - Left atrium: The atrium was normal in size. - Tricuspid valve: There was mild regurgitation. - Pulmonary arteries: PA peak pressure: 23 mm Hg (S). - Inferior vena cava: The vessel was normal in size. The   respirophasic diameter changes were in the normal range (>= 50%),   consistent with normal central venous pressure.   Impressions:   - LVEF 60-65%, normal wall thickness, normal wall motion, grade 2   DD with elevated LV Filling pressure, trivial MR, mild TR, RVSP   23 mmHg, normal IVC.     ASSESSMENT:    1. Syncope, unspecified syncope type   2. OSA (obstructive sleep apnea)   3. Gastroesophageal reflux disease without esophagitis      PLAN:  In order of problems listed above:  Syncope with new RBB and LA FB on EKG.  Was not orthostatic.  Potassium was 3.2 and she felt like she was dehydrated and not eating properly.  Repeat be met today.  30-day monitor placed today. Will check Lexiscan Myoview to rule out ischemia.  2D echo a year ago showed normal LV function with grade 2 DD.  All up with Dr. Radford Pax in 6 weeks. OSA followed by Dr. Radford Pax  GERD doing better on Protonix.    Medication Adjustments/Labs and Tests Ordered: Current medicines are reviewed at length with the patient today.  Concerns regarding medicines are outlined above.  Medication changes, Labs and Tests ordered today are listed in the Patient Instructions below. Patient Instructions  Medication Instructions:  Your physician recommends that you continue on your current medications as directed. Please refer to the Current Medication list given to you today.   Labwork: TODAY:  BMET  Testing/Procedures: Your physician has requested that you have a lexiscan myoview. For  further information please visit [email or web address]. Please follow instruction sheet, as given.    Follow-Up: Your physician recommends that you schedule a follow-up appointment in: [place] DR. Radford Pax, ONLY, PER MICHEL Willow Reczek, PA-C   Any Other Special Instructions Will Be Listed Below (If Applicable).  MUGA Scan A MUGA scan (multigated acquisition scan) is a test that makes pictures of your heart at specific times while it beats. The test uses a radioactive dye (tracer) that is injected into your bloodstream. The dye makes it possible for your health care provider to see the red blood cells passing through your heart. A MUGA scan shows:  How much blood your heart is pumping out with each heartbeat.  How well your ventricles are working. Ventricles are chambers in the lower part of your heart. They pump blood to your lungs and to the rest of your body.  A MUGA scan may be done to determine:  What is causing heart symptoms, such as chest pain.  If the arteries that supply your heart are blocked.  If you have heart valve disease.  If your heart is damaged from a heart attack.  If your heart has trouble pumping blood.  Tell a health care provider about:  Any allergies you have.  All medicines you are taking, including vitamins, herbs, eye drops, creams, and over-the-counter medicines.  Any blood disorders you have.  Any surgeries you have had.  Any medical conditions you have.  If you are pregnant, if you might be pregnant, or if you are breastfeeding. This is very important. What are the risks? Generally this is a safe procedure. However, problems can occur and include:  An allergic reaction to the tracer. This side effect is rare.  Pain and redness at the IV site.  Potential harm to your baby if you are pregnant or breastfeeding.  What happens before the procedure?  Do not take your regular medicines before your procedure if your health care provider asks you  not to. Ask your health care provider about changing or stopping those medicines.  Do not drink any beverages that have caffeine for several hours before the scan. Beverages containing caffeine include coffee, tea, and soft drinks.  Ask your health care provider if you will be having an exercise scan with your MUGA scan.  If you will be having an exercise scan with your MUGA scan, do not eat or drink anything except water for 4 hours before the scan.  Wear loose, comfortable clothing. What happens during the procedure?  You will be asked to lie down on an exam table.  An IV tube will be put into one of your veins. Medicine will flow through the tube during the procedure.  Discs called electrodes will be attached to your chest, arms, and legs. These will be connected with wires to a machine.  A small amount of tracer will be injected through your IV. You may feel a cold sensation in your arm as it runs through your IV.  A camera will be placed over your chest. It will take a series of pictures while you rest.  If you need to have an exercise scan, you will walk on a treadmill or ride a stationary bicycle. Then you will be asked to lie back down on the exam table for more pictures.  After all of the pictures have been taken, your IV tube will be removed. What happens after the procedure?  Drink enough fluid to keep your urine clear or pale yellow. This helps to flush the tracer out of your body.  It is your responsibility to get your test results. Ask the lab or department performing the test when and how you will get your results.  Keep all follow-up visits as directed by your health care provider. This is important. This information is not intended to replace advice given to you by your health care provider. Make sure you discuss any questions you have with your health care provider. Document Released: 07/06/2005 Document Revised: 10/23/2015 Document Reviewed: 10/04/2013 Elsevier  Interactive Patient Education  [employer].    If you need a refill on your cardiac medications before your next appointment, please call your pharmacy.      Sumner Boast, PA-C  06/27/2017 12:19 PM    [facility] [place], [place], [place]  [address] Phone: [phone]; Fax: [phone]

## 2017-06-28 ENCOUNTER — Other Ambulatory Visit: Payer: Medicare Other

## 2017-06-28 ENCOUNTER — Telehealth: Payer: Self-pay | Admitting: Physician Assistant

## 2017-06-28 NOTE — Telephone Encounter (Signed)
New message  Pt verbalized that she is calling for the RN,  Sharyn Lull told pt 06/28/2017 that the potassium levels was going   to be checked before she left yesterday appt  Today she has to see Dr.Kerr and she wants to have the missed lab work added ( @t  3:50pm today she will have labs done)   Can you contact her or provide orders?   When [facility] was [facility] 06/23/2017 test was don't and one imaging they did they did the dye and it went outside of her vein  She has a stress test on 07/05/2017  she is worried about the dye in her system and the nuclear injections  She want to know if it will damage her kidneys

## 2017-06-28 NOTE — Telephone Encounter (Signed)
Returned pts call.  She had forgot to get her lab work while in the office yesterday, 06/27/17, when she saw Ermalinda Barrios, PA-C, and wanted to know if it was ok for her to get it at Dr. Cindra Eves office.  I did advise pt that was fine, just to make sure we received a copy.  Pt also had questions re: her stress test. She was advised that her EKG on her o/v did show new RBBB and that we needed to r/o any type of blockage, with her recent trip to the ED and her current symptoms.  Pt did thank me for taking the time to return her call and answering her questions.

## 2017-06-28 NOTE — Addendum Note (Signed)
Addended by: Gaetano Net on: 06/28/2017 12:19 PM   Modules accepted: Orders

## 2017-06-28 NOTE — Addendum Note (Signed)
Addended by: Gaetano Net on: 06/28/2017 11:17 AM   Modules accepted: Orders

## 2017-06-30 ENCOUNTER — Telehealth (HOSPITAL_COMMUNITY): Payer: Self-pay | Admitting: *Deleted

## 2017-06-30 NOTE — Telephone Encounter (Signed)
Patient given detailed instructions per Myocardial Perfusion Study Information Sheet for the test on 07/05/17. Patient notified to arrive 15 minutes early and that it is imperative to arrive on time for appointment to keep from having the test rescheduled.  If you need to cancel or reschedule your appointment, please call the office within 24 hours of your appointment. . Patient verbalized understanding. Kirstie Peri

## 2017-07-05 ENCOUNTER — Ambulatory Visit (HOSPITAL_COMMUNITY): Payer: Medicare Other | Attending: Cardiovascular Disease

## 2017-07-05 DIAGNOSIS — I2510 Atherosclerotic heart disease of native coronary artery without angina pectoris: Secondary | ICD-10-CM | POA: Insufficient documentation

## 2017-07-05 DIAGNOSIS — R55 Syncope and collapse: Secondary | ICD-10-CM | POA: Diagnosis not present

## 2017-07-05 DIAGNOSIS — R079 Chest pain, unspecified: Secondary | ICD-10-CM | POA: Diagnosis present

## 2017-07-05 LAB — MYOCARDIAL PERFUSION IMAGING
LV dias vol: 99 mL (ref 46–106)
LV sys vol: 31 mL
Peak HR: 101 {beats}/min
RATE: 0.35
Rest HR: 65 {beats}/min
SDS: 4
SRS: 3
SSS: 7
TID: 0.81

## 2017-07-05 MED ORDER — TECHNETIUM TC 99M TETROFOSMIN IV KIT
32.2000 | PACK | Freq: Once | INTRAVENOUS | Status: AC | PRN
  Administered 2017-07-05: 32.2 via INTRAVENOUS
  Filled 2017-07-05: qty 33

## 2017-07-05 MED ORDER — TECHNETIUM TC 99M TETROFOSMIN IV KIT
10.6000 | PACK | Freq: Once | INTRAVENOUS | Status: AC | PRN
  Administered 2017-07-05: 10.6 via INTRAVENOUS
  Filled 2017-07-05: qty 11

## 2017-07-05 MED ORDER — REGADENOSON 0.4 MG/5ML IV SOLN
0.4000 mg | Freq: Once | INTRAVENOUS | Status: AC
  Administered 2017-07-05: 0.4 mg via INTRAVENOUS

## 2017-07-18 ENCOUNTER — Other Ambulatory Visit: Payer: Self-pay

## 2017-07-18 ENCOUNTER — Ambulatory Visit (HOSPITAL_COMMUNITY)
Admission: RE | Admit: 2017-07-18 | Discharge: 2017-07-18 | Disposition: A | Discharge status: Home / Self Care | Payer: Medicare Other | Source: Ambulatory Visit | Attending: Nurse Practitioner | Admitting: Nurse Practitioner

## 2017-07-18 ENCOUNTER — Telehealth: Payer: Self-pay | Admitting: Cardiology

## 2017-07-18 ENCOUNTER — Encounter (HOSPITAL_COMMUNITY): Payer: Self-pay | Admitting: Nurse Practitioner

## 2017-07-18 VITALS — BP 152/84 | HR 83 | Ht 68.0 in | Wt 184.0 lb

## 2017-07-18 DIAGNOSIS — E785 Hyperlipidemia, unspecified: Secondary | ICD-10-CM | POA: Insufficient documentation

## 2017-07-18 DIAGNOSIS — Z90710 Acquired absence of both cervix and uterus: Secondary | ICD-10-CM | POA: Insufficient documentation

## 2017-07-18 DIAGNOSIS — Z8673 Personal history of transient ischemic attack (TIA), and cerebral infarction without residual deficits: Secondary | ICD-10-CM | POA: Insufficient documentation

## 2017-07-18 DIAGNOSIS — Z96653 Presence of artificial knee joint, bilateral: Secondary | ICD-10-CM | POA: Diagnosis not present

## 2017-07-18 DIAGNOSIS — I739 Peripheral vascular disease, unspecified: Secondary | ICD-10-CM | POA: Diagnosis not present

## 2017-07-18 DIAGNOSIS — K648 Other hemorrhoids: Secondary | ICD-10-CM | POA: Diagnosis not present

## 2017-07-18 DIAGNOSIS — Z881 Allergy status to other antibiotic agents status: Secondary | ICD-10-CM | POA: Insufficient documentation

## 2017-07-18 DIAGNOSIS — M8580 Other specified disorders of bone density and structure, unspecified site: Secondary | ICD-10-CM | POA: Diagnosis not present

## 2017-07-18 DIAGNOSIS — K449 Diaphragmatic hernia without obstruction or gangrene: Secondary | ICD-10-CM | POA: Insufficient documentation

## 2017-07-18 DIAGNOSIS — Z96641 Presence of right artificial hip joint: Secondary | ICD-10-CM | POA: Diagnosis not present

## 2017-07-18 DIAGNOSIS — Z8249 Family history of ischemic heart disease and other diseases of the circulatory system: Secondary | ICD-10-CM | POA: Insufficient documentation

## 2017-07-18 DIAGNOSIS — I480 Paroxysmal atrial fibrillation: Secondary | ICD-10-CM | POA: Diagnosis not present

## 2017-07-18 DIAGNOSIS — Z791 Long term (current) use of non-steroidal anti-inflammatories (NSAID): Secondary | ICD-10-CM | POA: Diagnosis not present

## 2017-07-18 DIAGNOSIS — K219 Gastro-esophageal reflux disease without esophagitis: Secondary | ICD-10-CM | POA: Insufficient documentation

## 2017-07-18 DIAGNOSIS — M797 Fibromyalgia: Secondary | ICD-10-CM | POA: Insufficient documentation

## 2017-07-18 DIAGNOSIS — Z820 Family history of epilepsy and other diseases of the nervous system: Secondary | ICD-10-CM | POA: Insufficient documentation

## 2017-07-18 DIAGNOSIS — Z833 Family history of diabetes mellitus: Secondary | ICD-10-CM | POA: Insufficient documentation

## 2017-07-18 DIAGNOSIS — Z79899 Other long term (current) drug therapy: Secondary | ICD-10-CM | POA: Diagnosis not present

## 2017-07-18 DIAGNOSIS — J45909 Unspecified asthma, uncomplicated: Secondary | ICD-10-CM | POA: Diagnosis not present

## 2017-07-18 DIAGNOSIS — F419 Anxiety disorder, unspecified: Secondary | ICD-10-CM | POA: Insufficient documentation

## 2017-07-18 DIAGNOSIS — F329 Major depressive disorder, single episode, unspecified: Secondary | ICD-10-CM | POA: Diagnosis not present

## 2017-07-18 DIAGNOSIS — Z8261 Family history of arthritis: Secondary | ICD-10-CM | POA: Insufficient documentation

## 2017-07-18 DIAGNOSIS — Z811 Family history of alcohol abuse and dependence: Secondary | ICD-10-CM | POA: Insufficient documentation

## 2017-07-18 DIAGNOSIS — Z9889 Other specified postprocedural states: Secondary | ICD-10-CM | POA: Diagnosis not present

## 2017-07-18 DIAGNOSIS — Z880 Allergy status to penicillin: Secondary | ICD-10-CM | POA: Insufficient documentation

## 2017-07-18 DIAGNOSIS — Z7901 Long term (current) use of anticoagulants: Secondary | ICD-10-CM | POA: Diagnosis not present

## 2017-07-18 DIAGNOSIS — Z91018 Allergy to other foods: Secondary | ICD-10-CM | POA: Insufficient documentation

## 2017-07-18 DIAGNOSIS — G4733 Obstructive sleep apnea (adult) (pediatric): Secondary | ICD-10-CM | POA: Insufficient documentation

## 2017-07-18 DIAGNOSIS — Z882 Allergy status to sulfonamides status: Secondary | ICD-10-CM | POA: Insufficient documentation

## 2017-07-18 DIAGNOSIS — K589 Irritable bowel syndrome without diarrhea: Secondary | ICD-10-CM | POA: Insufficient documentation

## 2017-07-18 DIAGNOSIS — Z800 Family history of malignant neoplasm of digestive organs: Secondary | ICD-10-CM | POA: Insufficient documentation

## 2017-07-18 DIAGNOSIS — Z91040 Latex allergy status: Secondary | ICD-10-CM | POA: Insufficient documentation

## 2017-07-18 DIAGNOSIS — E039 Hypothyroidism, unspecified: Secondary | ICD-10-CM | POA: Insufficient documentation

## 2017-07-18 DIAGNOSIS — Z885 Allergy status to narcotic agent status: Secondary | ICD-10-CM | POA: Insufficient documentation

## 2017-07-18 DIAGNOSIS — Z91013 Allergy to seafood: Secondary | ICD-10-CM | POA: Insufficient documentation

## 2017-07-18 DIAGNOSIS — Z87891 Personal history of nicotine dependence: Secondary | ICD-10-CM | POA: Insufficient documentation

## 2017-07-18 DIAGNOSIS — Z883 Allergy status to other anti-infective agents status: Secondary | ICD-10-CM | POA: Insufficient documentation

## 2017-07-18 MED ORDER — METOPROLOL TARTRATE 25 MG PO TABS: 12.5000 mg | ORAL_TABLET | Freq: Two times a day (BID) | ORAL | 2 refills | Status: DC

## 2017-07-18 MED ORDER — APIXABAN 5 MG PO TABS: 5.0000 mg | ORAL_TABLET | Freq: Two times a day (BID) | ORAL | 0 refills | Status: DC

## 2017-07-18 NOTE — Telephone Encounter (Signed)
A critical monitor came through on patient, who has a history of Afib.. HR at 120 bpm at 4am..   Patient was no woken, but is under a lot of stress because of husband's oncology status..    Patient felt a fullness in throat at 7 am, but had no other symptoms  .Patient has an appointment with Afib clinic at 11:30 am today.Marland Kitchen

## 2017-07-18 NOTE — Telephone Encounter (Signed)
Kimberly [name] calling with [employer],  States that he would like to report critical EKG.

## 2017-07-18 NOTE — Progress Notes (Signed)
Primary Care Physician: Lajean Manes, MD Referring Physician: Dr. Hal Neer is a 77 y.o. female with a h/o moderate OSA on CPAP, asthma, and depression normal stress Myoview in 2014.  Patient was in the ER 06/23/17 with syncope.  She got up to go to the bathroom at 3 AM and lost consciousness for about 30 seconds.  She then had chest pain afterwards that resolved with nitroglycerin and aspirin.  Troponins were negative x2 and CT of the head was unremarkable. Wasn't orthostatic. Prior to this she has had worsening GERD.  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  She had a similar episode 04/2016 that was felt to be vasovagal.  Echo at that time was normal.  Palpitations were related to caffeine intake at that time.  Dr. Radford Pax ordered an event monitor which was placed 1/28.  Pt is being seen in the afib cliinic today,2/18,  for events noted on the monitor that show afib with RVR at 120 bpm. She is in SR today. She is under a lot of stress 2/2 her husbands dx of cancer. She does not drink alcohol, no tobacco or excessive caffeine.   Today, she denies symptoms of palpitations, chest pain, shortness of breath, orthopnea, PND, lower extremity edema, dizziness, presyncope, syncope, or neurologic sequela. The patient is tolerating medications without difficulties and is otherwise without complaint today.   Past Medical History:  Diagnosis Date  . Allergy   . Anal polyp 1998   Flex Sig   . Anemia   . Angular blepharitis of left eye   . Ankle fracture    Stress fracture  . Anxiety   . Asthma    border line has inhaler  . Bunion   . Cataracts, bilateral   . Chills (without fever)   . Corneal scar    left eye  . Degenerative disc disease   . Depression   . Diverticulosis of colon (without mention of hemorrhage) 2010   Colonoscopy  . Dry eyes    bilateral  . Enterocele   . External hemorrhoids 2000   Colonoscopy  . Family history of malignant neoplasm of  gastrointestinal tract   . Female cystocele   . Fibromyalgia   . GERD (gastroesophageal reflux disease)   . GERD (gastroesophageal reflux disease)   . Hiatal hernia 2005,2010   EGD  . History of bronchitis   . History of measles   . History of mumps   . History of strep sore throat   . History of urinary tract infection   . Hyperlipemia   . Hypothyroidism   . IBS (irritable bowel syndrome)   . Imbalance   . Internal hemorrhoids without mention of complication 1324,4010   Colonoscopy   . Irregular heart beat   . Itching   . Lacunar stroke   . Menopause   . Migraine   . OSA (obstructive sleep apnea) 05/14/2015  . Osteopenia 10/2013   T score -1.6 FRAX 10%/1.6%  . Pancreatitis   . PCO (posterior capsular opacification)    left  . Pneumonia    childhood illness  . PONV (postoperative nausea and vomiting)   . Pseudophakia, both eyes   . PVD (posterior vitreous detachment) right  . Rash    on back   . Retinal scar    left  . Rotator cuff disorder    pain, left shoulder  . Shingles   . Stricture and stenosis of esophagus 2005,2010  EGD   . Stroke ([place])   . Ulcerative colitis ([place])   . Varicose veins   . Vertigo   . Wears glasses    Past Surgical History:  Procedure Laterality Date  . ABDOMINAL HYSTERECTOMY    . ANAL FISSURE REPAIR    . BREAST EXCISIONAL BIOPSY Left   . BUNIONECTOMY  2014  . CATARACT EXTRACTION Bilateral 2013  . COLONOSCOPY     polyp removed  . ESOPHAGEAL DILATION    . EYE SURGERY Left   . FOOT SURGERY     left   . HEMORRHOID SURGERY    . MOUTH SURGERY  11/13/11   Cyst removed from gum-benign   . NASAL SEPTUM SURGERY    . NASAL SEPTUM SURGERY  1970's  . REPLACEMENT TOTAL KNEE Right 2011  . TONSILLECTOMY    . TOTAL ABDOMINAL HYSTERECTOMY W/ BILATERAL SALPINGOOPHORECTOMY  1991   TAH BSO  . TOTAL HIP ARTHROPLASTY     right   . TOTAL KNEE ARTHROPLASTY     both  . TOTAL KNEE ARTHROPLASTY Left 06/23/2015   Procedure: LEFT TOTAL KNEE  ARTHROPLASTY;  Surgeon: Gaynelle Arabian, MD;  Location: [facility];  Service: Orthopedics;  Laterality: Left;    Current Outpatient Medications  Medication Sig Dispense Refill  . acetaminophen (TYLENOL) 500 MG tablet Take 500 mg by mouth every 6 (six) hours as needed for moderate pain.     . Calcium Carbonate-Vitamin D (CALTRATE 600+D) 600-400 MG-UNIT tablet Take 1 tablet by mouth 2 (two) times daily.    . Cholecalciferol (VITAMIN D) 2000 units CAPS Take 1 capsule by mouth daily.    Marland Kitchen docusate sodium (COLACE) 100 MG capsule Take 100 mg by mouth 2 (two) times daily as needed for mild constipation.     Marland Kitchen EPINEPHrine 0.3 mg/0.3 mL IJ SOAJ injection Inject 0.3 mg into the muscle once. Reported on 05/28/2015    . fluticasone (FLONASE) 50 MCG/ACT nasal spray Place 2 sprays into both nostrils every other day.     . ibuprofen (ADVIL,MOTRIN) 200 MG tablet Take 200-400 mg by mouth as needed.    Marland Kitchen levothyroxine (SYNTHROID) 100 MCG tablet Take 100 mcg by mouth daily before breakfast.    . montelukast (SINGULAIR) 10 MG tablet Take 1 tablet by mouth daily as needed.   5  . pantoprazole (PROTONIX) 40 MG tablet Take 40 mg by mouth daily.    . polyethylene glycol powder (GLYCOLAX/MIRALAX) powder Dissolve 9 grams in at least 8 ounces water/juice and drink once daily 527 g 2  . PROAIR HFA 108 (90 Base) MCG/ACT inhaler TAKE 2 PUFFS AS NEEDED EVERY 4-6 HOURS AS NEEDED COUGH/WHEEZE INHALATION 90  0  . Probiotic Product (ALIGN) 4 MG CAPS Take 1 capsule by mouth daily.    . RESTASIS 0.05 % ophthalmic emulsion Place 1 drop into both eyes 2 (two) times daily. Uses as directed    . rosuvastatin (CRESTOR) 10 MG tablet TAKE ONE HALF TABLET ONCE A DAY AT BEDTIME ORALLY 90 DAY(S)  4  . sodium chloride (OCEAN) 0.65 % SOLN nasal spray Place 2 sprays into both nostrils as needed for congestion.    . thiamine (VITAMIN B-1) 100 MG tablet Take 100 mg by mouth daily.    Marland Kitchen apixaban (ELIQUIS) 5 MG TABS tablet Take 1 tablet (5 mg total) by  mouth 2 (two) times daily. 60 tablet 0  . metoprolol tartrate (LOPRESSOR) 25 MG tablet Take 0.5 tablets (12.5 mg total) by mouth 2 (two) times daily.  30 tablet 2   No current facility-administered medications for this encounter.     Allergies  Allergen Reactions  . Azithromycin Other (See Comments)    Pancreatitis  . Erythromycin Hives    Reaction 1 time, when she was younger. Reaction 1 time, when she was younger.   Toniann Ket Other (See Comments)    Almonds: per allergy testing.  . Amoxicillin-Pot Clavulanate Nausea And Vomiting and Nausea Only    Has patient had a PCN reaction causing immediate rash, facial/tongue/throat swelling, SOB or lightheadedness with hypotension: No Has patient had a PCN reaction causing severe rash involving mucus membranes or skin necrosis: No Has patient had a PCN reaction that required hospitalization No Has patient had a PCN reaction occurring within the last 10 years: No If all of the above answers are "NO", then may proceed with Cephalosporin use.   . Atorvastatin Other (See Comments)    Muscle weakness  Muscle weakness   . Codeine Nausea Only  . Ketoconazole Nausea And Vomiting  . Ketoconazole Nausea Only and Nausea And Vomiting  . Latex Other (See Comments)    Other  . Lincomycin     Stomach upset   . Morphine Nausea Only  . Morphine Sulfate Nausea Only and Other (See Comments)      Unknown  . Other Other (See Comments)    Shellfish mix: per allergy testing.  . Shellfish Allergy Other (See Comments)    Shellfish mix: per allergy testing.  . Simvastatin Other (See Comments)    Muscle pain  . Sulfamethoxazole-Trimethoprim Nausea Only    Upset stomach   . Clindamycin Rash  . Clindamycin/Lincomycin Nausea And Vomiting and Rash  . Erythromycin Base Rash    Social History   Socioeconomic History  . Marital status: Married    Spouse name: Not on file  . Number of children: 1  . Years of education: bus.school  . Highest  education level: Not on file  Social Needs  . Financial resource strain: Not on file  . Food insecurity - worry: Not on file  . Food insecurity - inability: Not on file  . Transportation needs - medical: Not on file  . Transportation needs - non-medical: Not on file  Occupational History  . Occupation: Retired    Fish farm manager: RETIRED  Tobacco Use  . Smoking status: Former Smoker    Packs/day: 0.25    Years: 15.00    Pack years: 3.75    Types: Cigarettes    Last attempt to quit: 06/01/1975    Years since quitting: 42.1  . Smokeless tobacco: Never Used  . Tobacco comment: Stopped smoking age 89  Substance and Sexual Activity  . Alcohol use: No    Alcohol/week: 0.0 oz  . Drug use: No  . Sexual activity: No    Birth control/protection: Surgical    Comment: HYST, intecourse age unknown, sexual partner less than 5  Other Topics Concern  . Not on file  Social History Narrative   Daily caffeine     Family History  Problem Relation Age of Onset  . Dementia Mother   . Irritable bowel syndrome Mother   . Alzheimer's disease Mother   . Stroke Mother   . Hypertension Father   . Congestive Heart Failure Father   . Alcohol abuse Father   . Diabetes Sister   . Colon cancer Paternal Aunt   . Heart disease Maternal Grandmother   . Heart attack Maternal Grandmother   . Heart attack  Paternal Grandfather   . Heart attack Maternal Grandfather   . Arthritis Brother   . Other Sister        pituitary disease  . Allergies Sister   . Other Sister        joint problems  . Esophageal cancer Neg Hx   . Rectal cancer Neg Hx   . Stomach cancer Neg Hx     ROS- All systems are reviewed and negative except as per the HPI above  Physical Exam: Vitals:   07/18/17 1135  BP: (!) 152/84  Pulse: 83  Weight: 184 lb (83.5 kg)  Height: 5\' 8"  (1.727 m)   Wt Readings from Last 3 Encounters:  07/18/17 184 lb (83.5 kg)  07/05/17 187 lb (84.8 kg)  06/27/17 187 lb (84.8 kg)    Labs: Lab Results    Component Value Date   NA 140 06/23/2017   K 3.2 (L) 06/23/2017   CL 106 06/23/2017   CO2 20 (L) 06/23/2017   GLUCOSE 120 (H) 06/23/2017   BUN 14 06/23/2017   CREATININE 0.82 06/23/2017   CALCIUM 9.4 06/23/2017   Lab Results  Component Value Date   INR 1.08 06/17/2015   Lab Results  Component Value Date   CHOL 124 07/23/2013   HDL 68 07/23/2013   LDLCALC 42 07/23/2013   TRIG 69 07/23/2013     GEN- The patient is well appearing, alert and oriented x 3 today.   Head- normocephalic, atraumatic Eyes-  Sclera clear, conjunctiva pink Ears- hearing intact Oropharynx- clear Neck- supple, no JVP Lymph- no cervical lymphadenopathy Lungs- Clear to ausculation bilaterally, normal work of breathing Heart- Regular rate and rhythm, no murmurs, rubs or gallops, PMI not laterally displaced GI- soft, NT, ND, + BS Extremities- no clubbing, cyanosis, or edema MS- no significant deformity or atrophy Skin- no rash or lesion Psych- euthymic mood, full affect Neuro- strength and sensation are intact  EKG-NSR at 63 bpm, LAD,RBBB Event monitor strips reviewed, showed Afib at 120 bpm  Echo-Study Conclusions  - Left ventricle: The cavity size was normal. Wall thickness was   normal. Systolic function was normal. The estimated ejection   fraction was in the range of 60% to 65%. Wall motion was normal;   there were no regional wall motion abnormalities. Doppler   parameters are consistent with pseudonormal left ventricular   relaxation (grade 2 diastolic dysfunction). The E/A ratio is   >1.5. The E/e&' ratio is >15, suggesting elevated LV filling   pressure. - Mitral valve: Mildly thickened leaflets . There was trivial   regurgitation. - Left atrium: The atrium was normal in size. - Tricuspid valve: There was mild regurgitation. - Pulmonary arteries: PA peak pressure: 23 mm Hg (S). - Inferior vena cava: The vessel was normal in size. The   respirophasic diameter changes were in the  normal range (>= 50%),   consistent with normal central venous pressure.  Impressions:  - LVEF 60-65%, normal wall thickness, normal wall motion, grade 2   DD with elevated LV Filling pressure, trivial MR, mild TR, RVSP   23 mmHg, normal IVC.    Assessment and Plan: 1. New onset paroxysmal afib General education re afib Pt reassured as she is very anxious Will start metoprolol 25 mg 1/2 tab bid for rate control Pt has h/o mild asthma, if aggravates this she will let me know Triggers for afib reviewed Continue to wear event monitor  2. Chadsvasc score of at least 5 Denies any bleeding issues,  history Will start eliquis 5 mg bid  Stop asa Do not use antiinflammatories Tylenol for pain Report any signs of bleeding  F/u here in one week   Butch Penny C. Carroll, [facility] [address] [place], [place] [address] [phone]

## 2017-07-18 NOTE — Patient Instructions (Signed)
Start metoprolol 1/2 tablet twice a day  Start Eliquis 5mg  twice a day  Stop aspirin

## 2017-07-20 ENCOUNTER — Other Ambulatory Visit (HOSPITAL_COMMUNITY): Payer: Self-pay | Admitting: *Deleted

## 2017-07-21 ENCOUNTER — Telehealth: Payer: Self-pay | Admitting: *Deleted

## 2017-07-21 ENCOUNTER — Emergency Department (HOSPITAL_COMMUNITY)
Admission: EM | Admit: 2017-07-21 | Discharge: 2017-07-21 | Disposition: A | Discharge status: Home / Self Care | Payer: Medicare Other | Attending: Emergency Medicine | Admitting: Emergency Medicine

## 2017-07-21 ENCOUNTER — Encounter (HOSPITAL_COMMUNITY): Payer: Self-pay | Admitting: Emergency Medicine

## 2017-07-21 ENCOUNTER — Other Ambulatory Visit: Payer: Self-pay

## 2017-07-21 ENCOUNTER — Other Ambulatory Visit (HOSPITAL_COMMUNITY): Payer: Self-pay | Admitting: *Deleted

## 2017-07-21 DIAGNOSIS — J45909 Unspecified asthma, uncomplicated: Secondary | ICD-10-CM | POA: Insufficient documentation

## 2017-07-21 DIAGNOSIS — Z79899 Other long term (current) drug therapy: Secondary | ICD-10-CM | POA: Insufficient documentation

## 2017-07-21 DIAGNOSIS — Z7901 Long term (current) use of anticoagulants: Secondary | ICD-10-CM | POA: Diagnosis not present

## 2017-07-21 DIAGNOSIS — Z91040 Latex allergy status: Secondary | ICD-10-CM | POA: Diagnosis not present

## 2017-07-21 DIAGNOSIS — Z8673 Personal history of transient ischemic attack (TIA), and cerebral infarction without residual deficits: Secondary | ICD-10-CM | POA: Diagnosis not present

## 2017-07-21 DIAGNOSIS — I4891 Unspecified atrial fibrillation: Secondary | ICD-10-CM | POA: Diagnosis not present

## 2017-07-21 DIAGNOSIS — E039 Hypothyroidism, unspecified: Secondary | ICD-10-CM | POA: Diagnosis not present

## 2017-07-21 DIAGNOSIS — F17210 Nicotine dependence, cigarettes, uncomplicated: Secondary | ICD-10-CM | POA: Diagnosis not present

## 2017-07-21 LAB — TROPONIN I: Troponin I: <0.03 ng/mL (ref <0.03)

## 2017-07-21 LAB — CBC
HCT: 42.4 % (ref 36.0–46.0)
Hemoglobin: 14.1 g/dL (ref 12.0–15.0)
MCH: 31.3 pg (ref 26.0–34.0)
MCHC: 33.3 g/dL (ref 30.0–36.0)
MCV: 94 fL (ref 78.0–100.0)
Platelets: 236 10*3/uL (ref 150–400)
RBC: 4.51 MIL/uL (ref 3.87–5.11)
RDW: 13.1 % (ref 11.5–15.5)
WBC: 6.9 10*3/uL (ref 4.0–10.5)

## 2017-07-21 LAB — COMPREHENSIVE METABOLIC PANEL
ALT: 16 U/L (ref 14–54)
AST: 25 U/L (ref 15–41)
Albumin: 4.2 g/dL (ref 3.5–5.0)
Alkaline Phosphatase: 55 U/L (ref 38–126)
Anion gap: 14 (ref 5–15)
BUN: 12 mg/dL (ref 6–20)
CO2: 21 mmol/L — ABNORMAL LOW (ref 22–32)
Calcium: 9.5 mg/dL (ref 8.9–10.3)
Chloride: 103 mmol/L (ref 101–111)
Creatinine, Ser: 0.69 mg/dL (ref 0.44–1.00)
GFR calc Af Amer: >60 mL/min (ref >60)
GFR calc non Af Amer: >60 mL/min (ref >60)
Glucose, Bld: 98 mg/dL (ref 65–99)
Potassium: 3.6 mmol/L (ref 3.5–5.1)
Sodium: 138 mmol/L (ref 135–145)
Total Bilirubin: 0.5 mg/dL (ref 0.3–1.2)
Total Protein: 7.2 g/dL (ref 6.5–8.1)

## 2017-07-21 MED ORDER — METOPROLOL TARTRATE 25 MG PO TABS: 25.0000 mg | ORAL_TABLET | Freq: Two times a day (BID) | ORAL | 3 refills | Status: DC

## 2017-07-21 MED ORDER — METOPROLOL TARTRATE 5 MG/5ML IV SOLN
5.0000 mg | Freq: Once | INTRAVENOUS | Status: AC
  Administered 2017-07-21: 5 mg via INTRAVENOUS
  Filled 2017-07-21: qty 5

## 2017-07-21 MED ORDER — METOPROLOL TARTRATE 25 MG PO TABS: 25.0000 mg | ORAL_TABLET | Freq: Two times a day (BID) | ORAL | 2 refills | Status: DC

## 2017-07-21 NOTE — ED Notes (Signed)
Pt arrived to triage with "heart fluttering" and states she needs to use the bathroom first.

## 2017-07-21 NOTE — ED Triage Notes (Signed)
Pt reports upper chest pain/pounding sensation today, noted to be in AFIB RVR in triage rate 135. Seen in Heart clinic on Monday for similar episode and stated on eliquis and metoprolol. Wearing a cardiac monitor, Dr Radford Pax is her cardiologist.

## 2017-07-21 NOTE — Telephone Encounter (Signed)
-----   Message from Sueanne Margarita, MD sent at 07/20/2017  7:22 PM EST ----- Please see if patient would be interested in referral for oral device

## 2017-07-21 NOTE — ED Provider Notes (Signed)
[facility] EMERGENCY DEPARTMENT Provider Note   CSN: [id number] Arrival date & time: 07/21/17  0043     History   Chief Complaint Chief Complaint  Patient presents with  . Atrial Fibrillation    HPI Kimberly [name] is a 77 y.o. female.  HPI Patient is a 77 year old female with a history of paroxysmal atrial fibrillation.  This was a recent diagnosis.  She is on anticoagulation.  She was recently started on 12.5 mg twice daily metoprolol.  Tonight she developed palpitations and presents to the ER for further evaluation.  On arrival to the emergency department she is in A. fib with RVR.  No active chest pain chest tightness at this time.  She has been compliant with her medications.  She is somewhat anxious.  She has no other complaints at this time.  Symptoms are mild to moderate in severity.   Past Medical History:  Diagnosis Date  . Allergy   . Anal polyp 1998   Flex Sig   . Anemia   . Angular blepharitis of left eye   . Ankle fracture    Stress fracture  . Anxiety   . Asthma    border line has inhaler  . Bunion   . Cataracts, bilateral   . Chills (without fever)   . Corneal scar    left eye  . Degenerative disc disease   . Depression   . Diverticulosis of colon (without mention of hemorrhage) 2010   Colonoscopy  . Dry eyes    bilateral  . Enterocele   . External hemorrhoids 2000   Colonoscopy  . Family history of malignant neoplasm of gastrointestinal tract   . Female cystocele   . Fibromyalgia   . GERD (gastroesophageal reflux disease)   . GERD (gastroesophageal reflux disease)   . Hiatal hernia 2005,2010   EGD  . History of bronchitis   . History of measles   . History of mumps   . History of strep sore throat   . History of urinary tract infection   . Hyperlipemia   . Hypothyroidism   . IBS (irritable bowel syndrome)   . Imbalance   . Internal hemorrhoids without mention of complication 6295,2841   Colonoscopy   . Irregular  heart beat   . Itching   . Lacunar stroke   . Menopause   . Migraine   . OSA (obstructive sleep apnea) 05/14/2015  . Osteopenia 10/2013   T score -1.6 FRAX 10%/1.6%  . Pancreatitis   . PCO (posterior capsular opacification)    left  . Pneumonia    childhood illness  . PONV (postoperative nausea and vomiting)   . Pseudophakia, both eyes   . PVD (posterior vitreous detachment) right  . Rash    on back   . Retinal scar    left  . Rotator cuff disorder    pain, left shoulder  . Shingles   . Stricture and stenosis of esophagus 2005,2010   EGD   . Stroke ([place])   . Ulcerative colitis ([place])   . Varicose veins   . Vertigo   . Wears glasses     Patient Active Problem List   Diagnosis Date Noted  . Syncope 05/13/2016  . Heart palpitations 05/13/2016  . IBS (irritable bowel syndrome) 07/14/2015  . Nausea without vomiting 07/14/2015  . OA (osteoarthritis) of knee 06/23/2015  . OSA (obstructive sleep apnea) 05/14/2015  . Preoperative clearance 01/08/2015  . Pancreatitis 07/22/2013  . Leukocytosis, unspecified  07/22/2013  . 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 Cataracts, bilateral   . Elevated cholesterol   . Degenerative disc disease   . Bunion   . Ankle fracture   . Lacunar stroke   . Hemorrhoid   . DUB (dysfunctional uterine bleeding)   . DIARRHEA 09/16/2009  . PERSONAL HX COLONIC POLYPS 09/16/2009  . LACUNAR INFARCTION 07/16/2008  . CONSTIPATION 07/12/2008  . CHEST PAIN 07/12/2008  . DYSPHAGIA 07/12/2008  . HYPOTHYROIDISM 07/10/2008  . ANXIETY 07/10/2008  . DEPRESSION 07/10/2008  . INTERNAL HEMORRHOIDS 07/10/2008  . ESOPHAGEAL STRICTURE 07/10/2008  . GERD 07/10/2008  . HIATAL HERNIA 07/10/2008  . DIVERTICULOSIS, COLON 07/10/2008  . IRRITABLE BOWEL SYNDROME 07/10/2008  . Osteoarthritis 07/10/2008  . FIBROMYALGIA 07/10/2008    Past Surgical History:  Procedure Laterality Date  . ABDOMINAL HYSTERECTOMY    . ANAL FISSURE REPAIR    . BREAST  EXCISIONAL BIOPSY Left   . BUNIONECTOMY  2014  . CATARACT EXTRACTION Bilateral 2013  . COLONOSCOPY     polyp removed  . ESOPHAGEAL DILATION    . EYE SURGERY Left   . FOOT SURGERY     left   . HEMORRHOID SURGERY    . MOUTH SURGERY  11/13/11   Cyst removed from gum-benign   . NASAL SEPTUM SURGERY    . NASAL SEPTUM SURGERY  1970's  . REPLACEMENT TOTAL KNEE Right 2011  . TONSILLECTOMY    . TOTAL ABDOMINAL HYSTERECTOMY W/ BILATERAL SALPINGOOPHORECTOMY  1991   TAH BSO  . TOTAL HIP ARTHROPLASTY     right   . TOTAL KNEE ARTHROPLASTY     both  . TOTAL KNEE ARTHROPLASTY Left 06/23/2015   Procedure: LEFT TOTAL KNEE ARTHROPLASTY;  Surgeon: Gaynelle Arabian, MD;  Location: [facility];  Service: Orthopedics;  Laterality: Left;    OB History    Gravida Para Term Preterm AB Living   0             SAB TAB Ectopic Multiple Live Births                   Home Medications    Prior to Admission medications   Medication Sig Start Date End Date Taking? Authorizing Provider  acetaminophen (TYLENOL) 500 MG tablet Take 500 mg by mouth every 6 (six) hours as needed for moderate pain.    Yes [provider]  apixaban (ELIQUIS) 5 MG TABS tablet Take 1 tablet (5 mg total) by mouth 2 (two) times daily. 07/18/17  Yes Sherran Needs, NP  Calcium Carbonate-Vitamin D (CALTRATE 600+D) 600-400 MG-UNIT tablet Take 1 tablet by mouth 2 (two) times daily.   Yes [provider]  EPINEPHrine 0.3 mg/0.3 mL IJ SOAJ injection Inject 0.3 mg into the muscle as needed (for allergic reaction). Reported on 05/28/2015    Yes [provider]  fluticasone (FLONASE) 50 MCG/ACT nasal spray Place 2 sprays into both nostrils every other day.  06/06/13  Yes [provider]  levothyroxine (SYNTHROID) 100 MCG tablet Take 100 mcg by mouth daily before breakfast.   Yes [provider]  montelukast (SINGULAIR) 10 MG tablet Take 1 tablet by mouth at bedtime.  04/09/15  Yes [provider]    pantoprazole (PROTONIX) 40 MG tablet Take 40 mg by mouth daily.   Yes [provider]  polyethylene glycol powder (GLYCOLAX/MIRALAX) powder Dissolve 9 grams in at least 8 ounces water/juice and drink once daily 08/26/16  Yes Pyrtle, Lajuan Lines, MD  [facility]  HFA 108 (90 Base) MCG/ACT inhaler TAKE 2 PUFFS AS NEEDED EVERY 4-6 HOURS AS NEEDED COUGH/WHEEZE INHALATION 90 04/09/15  Yes [provider]  Probiotic Product (ALIGN) 4 MG CAPS Take 1 capsule by mouth daily.   Yes [provider]  RESTASIS 0.05 % ophthalmic emulsion Place 1 drop into both eyes 2 (two) times daily. Uses as directed 09/30/12  Yes [provider]  rosuvastatin (CRESTOR) 10 MG tablet TAKE ONE HALF TABLET ONCE A DAY AT BEDTIME ORALLY 90 DAY(S) 03/28/15  Yes [provider]  metoprolol tartrate (LOPRESSOR) 25 MG tablet Take 1 tablet (25 mg total) by mouth 2 (two) times daily. 07/21/17   Jola Schmidt, MD    Family History Family History  Problem Relation Age of Onset  . Dementia Mother   . Irritable bowel syndrome Mother   . Alzheimer's disease Mother   . Stroke Mother   . Hypertension Father   . Congestive Heart Failure Father   . Alcohol abuse Father   . Diabetes Sister   . Colon cancer Paternal Aunt   . Heart disease Maternal Grandmother   . Heart attack Maternal Grandmother   . Heart attack Paternal Grandfather   . Heart attack Maternal Grandfather   . Arthritis Brother   . Other Sister        pituitary disease  . Allergies Sister   . Other Sister        joint problems  . Esophageal cancer Neg Hx   . Rectal cancer Neg Hx   . Stomach cancer Neg Hx     Social History Social History   Tobacco Use  . Smoking status: Former Smoker    Packs/day: 0.25    Years: 15.00    Pack years: 3.75    Types: Cigarettes    Last attempt to quit: 06/01/1975    Years since quitting: 42.1  . Smokeless tobacco: Never Used  . Tobacco comment: Stopped smoking age 2  Substance Use Topics  .  Alcohol use: No    Alcohol/week: 0.0 oz  . Drug use: No     Allergies   Azithromycin; Erythromycin; Almond oil; Amoxicillin-pot clavulanate; Atorvastatin; Codeine; Ketoconazole; Ketoconazole; Latex; Lincomycin; Morphine; Morphine sulfate; Other; Shellfish allergy; Simvastatin; Sulfamethoxazole-trimethoprim; Clindamycin; Clindamycin/lincomycin; and Erythromycin base   Review of Systems Review of Systems  All other systems reviewed and are negative.    Physical Exam Updated Vital Signs BP 116/69   Pulse 67   Temp 98 F (36.7 C) (Oral)   Resp 15   Ht 5\' 8"  (1.727 m)   Wt 82.6 kg (182 lb)   SpO2 95%   BMI 27.67 kg/m   Physical Exam  Constitutional: She is oriented to person, place, and time. She appears well-developed and well-nourished.  HENT:  Head: Normocephalic.  Eyes: EOM are normal.  Neck: Normal range of motion.  Cardiovascular:  Tachycardia.  Irregularly irregular.  Pulmonary/Chest: Effort normal and breath sounds normal.  Abdominal: Soft. She exhibits no distension.  Musculoskeletal: Normal range of motion.  Neurological: She is alert and oriented to person, place, and time.  Psychiatric: She has a normal mood and affect.  Nursing note and vitals reviewed.    ED Treatments / Results  Labs (all labs ordered are listed, but only abnormal results are displayed) Labs Reviewed  COMPREHENSIVE METABOLIC PANEL - Abnormal; Notable for the following components:      Result Value   CO2 21 (*)    All other components within normal limits  CBC  TROPONIN I    EKG  EKG Interpretation #1  Date/Time:  Thursday July 21 2017 00:54:16 EST Ventricular Rate:  133 PR Interval:    QRS Duration: 138 QT Interval:  346 QTC Calculation: 514 R Axis:   -42 Text Interpretation:  Atrial fibrillation with rapid ventricular response Left axis deviation Right bundle branch block Abnormal ECG afib new since last tracing Confirmed by Jola Schmidt [id number]) on 07/21/2017 3:07:31  AM        EKG Interpretation #2  Date/Time:  Thursday July 21 2017 01:28:36 EST Ventricular Rate:  74 PR Interval:    QRS Duration: 150 QT Interval:  426 QTC Calculation: 473 R Axis:   -61 Text Interpretation:  Sinus rhythm Prolonged PR interval RBBB and LAFB Probable left ventricular hypertrophy afib resolved Confirmed by Jola Schmidt [phone]) on 07/21/2017 3:08:39 AM        Radiology No results found.  Procedures Procedures (including critical care time)  Medications Ordered in ED Medications  metoprolol tartrate (LOPRESSOR) injection 5 mg (5 mg Intravenous Given 07/21/17 0114)     Initial Impression / Assessment and Plan / ED Course  I have reviewed the triage vital signs and the nursing notes.  Pertinent labs & imaging results that were available during my care of the patient were reviewed by me and considered in my medical decision making (see chart for details).    A. fib quickly resolved with a single dose of IV Lopressor.  I will double the patient's home dose of metoprolol.  Cardiology follow-up.  Patient understands return to the ER for new or worsening symptoms.  Overall well-appearing.     CHA2DS2/VAS Stroke Risk Points      5 >= 2 Points: High Risk  1 - 1.99 Points: Medium Risk  0 Points: Low Risk    The patient's score has not changed in the past year.:  No Change     Details    This score determines the patient's risk of having a stroke if the  patient has atrial fibrillation.       Points Metrics  0 Has Congestive Heart Failure:  No   0 Has Vascular Disease:  No   0 Has Hypertension:  No   2 Age:  73   0 Has Diabetes:  No   2 Had Stroke:  Yes  Had TIA:  No  Had thromboembolism:  No   1 Female:  Yes             Final Clinical Impressions(s) / ED Diagnoses   Final diagnoses:  Atrial fibrillation with RVR [facility])    ED Discharge Orders        Ordered    metoprolol tartrate (LOPRESSOR) 25 MG tablet  2 times daily     07/21/17  0304       Jola Schmidt, MD 07/21/17 [phone]

## 2017-07-21 NOTE — Telephone Encounter (Signed)
Spoke with patient to see if she would be interested in a referral for an oral device and patient states not right now because she is dealing with some other health problems. Patient thanked me for calling.

## 2017-07-24 ENCOUNTER — Telehealth: Payer: Self-pay | Admitting: Physician Assistant

## 2017-07-24 NOTE — Telephone Encounter (Signed)
Paged by answering service, having itching all over her body and head.  Started 2 days ago.  Occurs mostly at night.  Patient recently started on metoprolol however went to emergency room 2/21 and increased to 25 mg twice daily.  Her issue started afterwards.  The patient also has cough, congestion and chills.  She is taking Singulair and Allegra for that.  No fever.  Patient is wearing monitor.  She did have issues with electrocautery previously with having rash and itching.  I have advised to take Benadryl today and call A. fib clinic tomorrow morning for further discussion.  If worsening symptoms that she cannot tolerate itching and shortness of breath, she will come to ER.  Patient agreed with plan and appreciative of call.

## 2017-07-27 ENCOUNTER — Telehealth: Payer: Self-pay | Admitting: Internal Medicine

## 2017-07-27 NOTE — Telephone Encounter (Signed)
Patient  Took  Lopressor for palpitations, and  Was tired  Due  To work ,  At  About  7:30 pt  Felt  Like had  Afib. Pt  feels  Weak,  ONE HALF OF LOPRESSOR  WAS  TAKEN  PT  STILL FEELS  LIKE THE  SAME , pt took about total 25 mg   And  Another dose of  12.5  Mg .   Patient  Has anxiety , as  Well. I advise , if  Another  Half,of  Lopressor can be  Taken , but I advise  To check  BP .   SO WE  ADVISED TO COME TO  ER   Also asked to call  In am to afib  clinic

## 2017-07-28 ENCOUNTER — Ambulatory Visit (HOSPITAL_COMMUNITY)
Admission: RE | Admit: 2017-07-28 | Discharge: 2017-07-28 | Disposition: A | Discharge status: Home / Self Care | Payer: Medicare Other | Source: Ambulatory Visit | Attending: Nurse Practitioner | Admitting: Nurse Practitioner

## 2017-07-28 ENCOUNTER — Encounter (HOSPITAL_COMMUNITY): Payer: Self-pay | Admitting: Nurse Practitioner

## 2017-07-28 VITALS — BP 122/76 | HR 51 | Ht 68.0 in | Wt 184.0 lb

## 2017-07-28 DIAGNOSIS — Z90710 Acquired absence of both cervix and uterus: Secondary | ICD-10-CM | POA: Insufficient documentation

## 2017-07-28 DIAGNOSIS — Z96653 Presence of artificial knee joint, bilateral: Secondary | ICD-10-CM | POA: Insufficient documentation

## 2017-07-28 DIAGNOSIS — Z883 Allergy status to other anti-infective agents status: Secondary | ICD-10-CM | POA: Insufficient documentation

## 2017-07-28 DIAGNOSIS — Z8249 Family history of ischemic heart disease and other diseases of the circulatory system: Secondary | ICD-10-CM | POA: Diagnosis not present

## 2017-07-28 DIAGNOSIS — G4733 Obstructive sleep apnea (adult) (pediatric): Secondary | ICD-10-CM | POA: Diagnosis not present

## 2017-07-28 DIAGNOSIS — Z87891 Personal history of nicotine dependence: Secondary | ICD-10-CM | POA: Insufficient documentation

## 2017-07-28 DIAGNOSIS — Z79899 Other long term (current) drug therapy: Secondary | ICD-10-CM | POA: Insufficient documentation

## 2017-07-28 DIAGNOSIS — F329 Major depressive disorder, single episode, unspecified: Secondary | ICD-10-CM | POA: Insufficient documentation

## 2017-07-28 DIAGNOSIS — Z881 Allergy status to other antibiotic agents status: Secondary | ICD-10-CM | POA: Diagnosis not present

## 2017-07-28 DIAGNOSIS — I480 Paroxysmal atrial fibrillation: Secondary | ICD-10-CM | POA: Insufficient documentation

## 2017-07-28 DIAGNOSIS — Z8673 Personal history of transient ischemic attack (TIA), and cerebral infarction without residual deficits: Secondary | ICD-10-CM | POA: Diagnosis not present

## 2017-07-28 DIAGNOSIS — Z882 Allergy status to sulfonamides status: Secondary | ICD-10-CM | POA: Diagnosis not present

## 2017-07-28 DIAGNOSIS — Z96641 Presence of right artificial hip joint: Secondary | ICD-10-CM | POA: Insufficient documentation

## 2017-07-28 DIAGNOSIS — Z880 Allergy status to penicillin: Secondary | ICD-10-CM | POA: Insufficient documentation

## 2017-07-28 DIAGNOSIS — Z833 Family history of diabetes mellitus: Secondary | ICD-10-CM | POA: Diagnosis not present

## 2017-07-28 DIAGNOSIS — Z91013 Allergy to seafood: Secondary | ICD-10-CM | POA: Insufficient documentation

## 2017-07-28 DIAGNOSIS — Z885 Allergy status to narcotic agent status: Secondary | ICD-10-CM | POA: Diagnosis not present

## 2017-07-28 NOTE — Progress Notes (Signed)
Primary Care Physician: Lajean Manes, MD Referring Physician: Dr. Hal Neer is a 77 y.o. female with a h/o moderate OSA on CPAP, asthma, and depression normal stress Myoview in 2014.  Patient was in the ER 06/23/17 with syncope.  She got up to go to the bathroom at 3 AM and lost consciousness for about 30 seconds.  She then had chest pain afterwards that resolved with nitroglycerin and aspirin.  Troponins were negative x2 and CT of the head was unremarkable. Wasn't orthostatic. Prior to this she has had worsening GERD.  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  She had a similar episode 04/2016 that was felt to be vasovagal.  Echo at that time was normal.  Palpitations were related to caffeine intake at that time.  Dr. Radford Pax ordered an event monitor which was placed 1/28.  Pt is being seen in the afib cliinic today,2/18,  for events noted on the monitor that show afib with RVR at 120 bpm. She is in SR today. She is under a lot of stress 2/2 her husbands dx of cancer. She does not drink alcohol, no tobacco or excessive caffeine. Pt was started on metoprolol 25 mg 1/2 tab bid and eliquis 5 mg bid for chadsvasc score of 5.  F/u in afib clinic 2/28. She had one episode of afib that she reported to the ER for v rate around 130 bpm, converted quickly to IV BB. BB was increased to 25 mg bid. She is in afib clinic in Sinus brady in the 50's, not symptomatic. Discussed when it is appropriate to go to ER and how to treat at home. Pt has a lot of anxiety which contributes to afib management. She turned in her monitor yesterday.  Today, she denies symptoms of palpitations, chest pain, shortness of breath, orthopnea, PND, lower extremity edema, dizziness, presyncope, syncope, or neurologic sequela. The patient is tolerating medications without difficulties and is otherwise without complaint today.   Past Medical History:  Diagnosis Date  . Allergy   . Anal polyp 1998   Flex Sig     . Anemia   . Angular blepharitis of left eye   . Ankle fracture    Stress fracture  . Anxiety   . Asthma    border line has inhaler  . Bunion   . Cataracts, bilateral   . Chills (without fever)   . Corneal scar    left eye  . Degenerative disc disease   . Depression   . Diverticulosis of colon (without mention of hemorrhage) 2010   Colonoscopy  . Dry eyes    bilateral  . Enterocele   . External hemorrhoids 2000   Colonoscopy  . Family history of malignant neoplasm of gastrointestinal tract   . Female cystocele   . Fibromyalgia   . GERD (gastroesophageal reflux disease)   . GERD (gastroesophageal reflux disease)   . Hiatal hernia 2005,2010   EGD  . History of bronchitis   . History of measles   . History of mumps   . History of strep sore throat   . History of urinary tract infection   . Hyperlipemia   . Hypothyroidism   . IBS (irritable bowel syndrome)   . Imbalance   . Internal hemorrhoids without mention of complication 9211,9417   Colonoscopy   . Irregular heart beat   . Itching   . Lacunar stroke   . Menopause   . Migraine   .  OSA (obstructive sleep apnea) 05/14/2015  . Osteopenia 10/2013   T score -1.6 FRAX 10%/1.6%  . Pancreatitis   . PCO (posterior capsular opacification)    left  . Pneumonia    childhood illness  . PONV (postoperative nausea and vomiting)   . Pseudophakia, both eyes   . PVD (posterior vitreous detachment) right  . Rash    on back   . Retinal scar    left  . Rotator cuff disorder    pain, left shoulder  . Shingles   . Stricture and stenosis of esophagus 2005,2010   EGD   . Stroke ([place])   . Ulcerative colitis ([place])   . Varicose veins   . Vertigo   . Wears glasses    Past Surgical History:  Procedure Laterality Date  . ABDOMINAL HYSTERECTOMY    . ANAL FISSURE REPAIR    . BREAST EXCISIONAL BIOPSY Left   . BUNIONECTOMY  2014  . CATARACT EXTRACTION Bilateral 2013  . COLONOSCOPY     polyp removed  . ESOPHAGEAL DILATION     . EYE SURGERY Left   . FOOT SURGERY     left   . HEMORRHOID SURGERY    . MOUTH SURGERY  11/13/11   Cyst removed from gum-benign   . NASAL SEPTUM SURGERY    . NASAL SEPTUM SURGERY  1970's  . REPLACEMENT TOTAL KNEE Right 2011  . TONSILLECTOMY    . TOTAL ABDOMINAL HYSTERECTOMY W/ BILATERAL SALPINGOOPHORECTOMY  1991   TAH BSO  . TOTAL HIP ARTHROPLASTY     right   . TOTAL KNEE ARTHROPLASTY     both  . TOTAL KNEE ARTHROPLASTY Left 06/23/2015   Procedure: LEFT TOTAL KNEE ARTHROPLASTY;  Surgeon: Gaynelle Arabian, MD;  Location: [facility];  Service: Orthopedics;  Laterality: Left;    Current Outpatient Medications  Medication Sig Dispense Refill  . acetaminophen (TYLENOL) 500 MG tablet Take 500 mg by mouth every 6 (six) hours as needed for moderate pain.     Marland Kitchen apixaban (ELIQUIS) 5 MG TABS tablet Take 1 tablet (5 mg total) by mouth 2 (two) times daily. 60 tablet 0  . Calcium Carbonate-Vitamin D (CALTRATE 600+D) 600-400 MG-UNIT tablet Take 1 tablet by mouth 2 (two) times daily.    Marland Kitchen EPINEPHrine 0.3 mg/0.3 mL IJ SOAJ injection Inject 0.3 mg into the muscle as needed (for allergic reaction). Reported on 05/28/2015     . fexofenadine (ALLEGRA) 180 MG tablet Take 180 mg by mouth daily.    . fluticasone (FLONASE) 50 MCG/ACT nasal spray Place 2 sprays into both nostrils every other day.     . levothyroxine (SYNTHROID) 100 MCG tablet Take 100 mcg by mouth daily before breakfast.    . metoprolol tartrate (LOPRESSOR) 25 MG tablet Take 1 tablet (25 mg total) by mouth 2 (two) times daily. 60 tablet 3  . montelukast (SINGULAIR) 10 MG tablet Take 1 tablet by mouth at bedtime.   5  . pantoprazole (PROTONIX) 40 MG tablet Take 40 mg by mouth daily.    . polyethylene glycol powder (GLYCOLAX/MIRALAX) powder Dissolve 9 grams in at least 8 ounces water/juice and drink once daily 527 g 2  . PROAIR HFA 108 (90 Base) MCG/ACT inhaler TAKE 2 PUFFS AS NEEDED EVERY 4-6 HOURS AS NEEDED COUGH/WHEEZE INHALATION 90  0  .  Probiotic Product (ALIGN) 4 MG CAPS Take 1 capsule by mouth daily.    . RESTASIS 0.05 % ophthalmic emulsion Place 1 drop into both eyes 2 (two)  times daily. Uses as directed    . rosuvastatin (CRESTOR) 10 MG tablet TAKE ONE HALF TABLET ONCE A DAY AT BEDTIME ORALLY 90 DAY(S)  4   No current facility-administered medications for this encounter.     Allergies  Allergen Reactions  . Azithromycin Other (See Comments)    Pancreatitis  . Erythromycin Hives    Reaction 1 time, when she was younger. Reaction 1 time, when she was younger.   Toniann Ket Other (See Comments)    Almonds: per allergy testing.  . Amoxicillin-Pot Clavulanate Nausea And Vomiting and Nausea Only    Has patient had a PCN reaction causing immediate rash, facial/tongue/throat swelling, SOB or lightheadedness with hypotension: No Has patient had a PCN reaction causing severe rash involving mucus membranes or skin necrosis: No Has patient had a PCN reaction that required hospitalization No Has patient had a PCN reaction occurring within the last 10 years: No If all of the above answers are "NO", then may proceed with Cephalosporin use.   . Atorvastatin Other (See Comments)    Muscle weakness  Muscle weakness   . Codeine Nausea Only  . Ketoconazole Nausea And Vomiting  . Ketoconazole Nausea Only and Nausea And Vomiting  . Latex Other (See Comments)    Other  . Lincomycin     Stomach upset   . Morphine Nausea Only  . Morphine Sulfate Nausea Only and Other (See Comments)      Unknown  . Other Other (See Comments)    Shellfish mix: per allergy testing.  . Shellfish Allergy Other (See Comments)    Shellfish mix: per allergy testing.  . Simvastatin Other (See Comments)    Muscle pain  . Sulfamethoxazole-Trimethoprim Nausea Only    Upset stomach   . Clindamycin Rash  . Clindamycin/Lincomycin Nausea And Vomiting and Rash  . Erythromycin Base Rash    Social History   Socioeconomic History  . Marital  status: Married    Spouse name: Not on file  . Number of children: 1  . Years of education: bus.school  . Highest education level: Not on file  Social Needs  . Financial resource strain: Not on file  . Food insecurity - worry: Not on file  . Food insecurity - inability: Not on file  . Transportation needs - medical: Not on file  . Transportation needs - non-medical: Not on file  Occupational History  . Occupation: Retired    Fish farm manager: RETIRED  Tobacco Use  . Smoking status: Former Smoker    Packs/day: 0.25    Years: 15.00    Pack years: 3.75    Types: Cigarettes    Last attempt to quit: 06/01/1975    Years since quitting: 42.1  . Smokeless tobacco: Never Used  . Tobacco comment: Stopped smoking age 62  Substance and Sexual Activity  . Alcohol use: No    Alcohol/week: 0.0 oz  . Drug use: No  . Sexual activity: No    Birth control/protection: Surgical    Comment: HYST, intecourse age unknown, sexual partner less than 5  Other Topics Concern  . Not on file  Social History Narrative   Daily caffeine     Family History  Problem Relation Age of Onset  . Dementia Mother   . Irritable bowel syndrome Mother   . Alzheimer's disease Mother   . Stroke Mother   . Hypertension Father   . Congestive Heart Failure Father   . Alcohol abuse Father   . Diabetes Sister   .  Colon cancer Paternal Aunt   . Heart disease Maternal Grandmother   . Heart attack Maternal Grandmother   . Heart attack Paternal Grandfather   . Heart attack Maternal Grandfather   . Arthritis Brother   . Other Sister        pituitary disease  . Allergies Sister   . Other Sister        joint problems  . Esophageal cancer Neg Hx   . Rectal cancer Neg Hx   . Stomach cancer Neg Hx     ROS- All systems are reviewed and negative except as per the HPI above  Physical Exam: Vitals:   07/28/17 0954  BP: 122/76  Pulse: (!) 51  Weight: 184 lb (83.5 kg)  Height: 5\' 8"  (1.727 m)   Wt Readings from Last 3  Encounters:  07/28/17 184 lb (83.5 kg)  07/21/17 182 lb (82.6 kg)  07/18/17 184 lb (83.5 kg)    Labs: Lab Results  Component Value Date   NA 138 07/21/2017   K 3.6 07/21/2017   CL 103 07/21/2017   CO2 21 (L) 07/21/2017   GLUCOSE 98 07/21/2017   BUN 12 07/21/2017   CREATININE 0.69 07/21/2017   CALCIUM 9.5 07/21/2017   Lab Results  Component Value Date   INR 1.08 06/17/2015   Lab Results  Component Value Date   CHOL 124 07/23/2013   HDL 68 07/23/2013   LDLCALC 42 07/23/2013   TRIG 69 07/23/2013     GEN- The patient is well appearing, alert and oriented x 3 today.   Head- normocephalic, atraumatic Eyes-  Sclera clear, conjunctiva pink Ears- hearing intact Oropharynx- clear Neck- supple, no JVP Lymph- no cervical lymphadenopathy Lungs- Clear to ausculation bilaterally, normal work of breathing Heart- Regular rate and rhythm, no murmurs, rubs or gallops, PMI not laterally displaced GI- soft, NT, ND, + BS Extremities- no clubbing, cyanosis, or edema MS- no significant deformity or atrophy Skin- no rash or lesion Psych- euthymic mood, full affect Neuro- strength and sensation are intact  EKG- Sinus brady at 51 bpm, RBBB, pr int 204 ms, qrs int 138 ms, qtc 422 ms Event monitor strips reviewed, showed Afib at 120 bpm  Echo-Study Conclusions  - Left ventricle: The cavity size was normal. Wall thickness was   normal. Systolic function was normal. The estimated ejection   fraction was in the range of 60% to 65%. Wall motion was normal;   there were no regional wall motion abnormalities. Doppler   parameters are consistent with pseudonormal left ventricular   relaxation (grade 2 diastolic dysfunction). The E/A ratio is   >1.5. The E/e&' ratio is >15, suggesting elevated LV filling   pressure. - Mitral valve: Mildly thickened leaflets . There was trivial   regurgitation. - Left atrium: The atrium was normal in size. - Tricuspid valve: There was mild regurgitation. -  Pulmonary arteries: PA peak pressure: 23 mm Hg (S). - Inferior vena cava: The vessel was normal in size. The   respirophasic diameter changes were in the normal range (>= 50%),   consistent with normal central venous pressure.  Impressions:  - LVEF 60-65%, normal wall thickness, normal wall motion, grade 2   DD with elevated LV Filling pressure, trivial MR, mild TR, RVSP   23 mmHg, normal IVC.  Stress test 2/5-lexi Myoview-Study Highlights     Nuclear stress EF: 68%.  There was no ST segment deviation noted during stress.  The study is normal.  This is a low  risk study.  The left ventricular ejection fraction is hyperdynamic (>65%).   Normal pharmacologic nuclear stress test with no evidence for prior infarct or ischemia. Normal LVEF.       Assessment and Plan: 1. New onset paroxysmal afib General education re afib reviewed Discussed how to manage at home and when it is appropriate to be seen in the ER Pt reassured as she is very anxious Continue metoprolol 25 mg bid, may have to reduce dose back to 1/2 tab bid , if HR dips into 40's or pt feels sluggish or lightheaded Triggers for afib reviewed Event monitor finished as of yesterday Discussing with Dr. Radford Pax oral device for OSA, as she had some issues with mask   2. Chadsvasc score of at least 5 Denies any bleeding issues, history Continue with  eliquis 5 mg bid  Do not use antiinflammatories Tylenol for pain Report any signs of bleeding  F/u with Dr. Radford Pax as scheduled 3/28 afib clinic as needed   Butch Penny C. Zohaib Heeney, [facility] [address] [place], [place] [address] [phone]

## 2017-08-01 ENCOUNTER — Encounter: Payer: Self-pay | Admitting: Internal Medicine

## 2017-08-01 ENCOUNTER — Ambulatory Visit: Payer: Medicare Other | Admitting: Internal Medicine

## 2017-08-01 VITALS — BP 130/76 | HR 53 | Ht 67.0 in | Wt 184.6 lb

## 2017-08-01 DIAGNOSIS — K581 Irritable bowel syndrome with constipation: Secondary | ICD-10-CM

## 2017-08-01 DIAGNOSIS — K5900 Constipation, unspecified: Secondary | ICD-10-CM | POA: Diagnosis not present

## 2017-08-01 DIAGNOSIS — K219 Gastro-esophageal reflux disease without esophagitis: Secondary | ICD-10-CM | POA: Diagnosis not present

## 2017-08-01 MED ORDER — PANTOPRAZOLE SODIUM 40 MG PO TBEC: 40.0000 mg | DELAYED_RELEASE_TABLET | Freq: Every day | ORAL | 10 refills | Status: DC

## 2017-08-01 NOTE — Progress Notes (Signed)
Subjective:    Patient ID: Kimberly [name], female    DOB: [birth year], 77 y.o.   MRN: [id number]  HPI Kimberly [name] is a 77 yo female with PMH of constipation with IBS, colonic diverticulosis, GERD with small hiatal hernia who is here for follow-up.  She was last seen in the office on 08/25/2016.  Since her last visit she has been diagnosed with atrial fibrillation and had 2 episodes of syncope.  She has been started on Eliquis and follows with Dr. Radford Pax with cardiology.  Number GI perspective she feels that she has been doing well.  She is still having some throat clearing in the morning but this is definitively better on the pantoprazole which she is using 40 mg daily.  She has not had heartburn, indigestion, dysphagia or odynophagia.  No nausea or vomiting.  From a bowel perspective she is using align daily as a probiotic and MiraLAX at half dose daily.  With this her bowel movements are regular and she does not feel constipated.  She also has more complete evacuation.  After her last visit we performed a CT scan of her abdomen and pelvis over her fear of pancreatic malignancy given her history of antibiotic-induced pancreatitis.  The CT scan was reassuring and unremarkable from a GI perspective.  She subsequently had another CT scan in early 2019 performed acutely for nausea and vomiting and this showed no acute findings.  There was aortic atherosclerosis and a stable mild L1 compression deformity.  She does admit to healthcare related anxiety both for her and her husband.  Her husband is dealing with thyroid cancer and also a more recent stroke.   Review of Systems As per HPI, otherwise negative  Current Medications, Allergies, Past Medical History, Past Surgical History, Family History and Social History were reviewed in [employer] record.     Objective:   Physical Exam BP 130/76   Pulse (!) 53   Ht 5\' 7"  (1.702 m)   Wt 184 lb 9.6 oz (83.7 kg)   LMP  (LMP  Unknown)   SpO2 97%   BMI 28.91 kg/m  Constitutional: Well-developed and well-nourished. No distress. HEENT: Normocephalic and atraumatic. No scleral icterus. Neck: Neck supple. Trachea midline. Cardiovascular: Normal rate, regular rhythm and intact distal pulses. No M/R/G Pulmonary/chest: Effort normal and breath sounds normal. No wheezing, rales or rhonchi. Abdominal: Soft, nontender, nondistended. Bowel sounds active throughout.  Extremities: no clubbing, cyanosis, or edema Neurological: Alert and oriented to person place and time. Skin: Skin is warm and dry. Psychiatric: Normal mood and affect. Behavior is normal.      Assessment & Plan:  77 yo female with PMH of constipation with IBS, colonic diverticulosis, GERD with small hiatal hernia who is here for follow-up.   1.  GERD/throat clearing and possible LPR --symptoms have improved with daily pantoprazole.  We discussed the risk, benefits and alternatives to chronic PPI and she wishes to continue therapy.  We will continue pantoprazole 40 mg once daily, 30 minutes before a meal.  Discussed ongoing GERD diet and antireflux lifestyle.  2.  IBS with constipation -- improved with daily MiraLAX.  She is taking half dose or 8 g daily.  This is working well and so we will continue this on a daily basis.  She can also continue her probiotic  3.  CRC screening --up-to-date with next colonoscopy in 2023  Annual follow-up, sooner if needed 25 minutes spent with the patient today. Greater than 50%  was spent in counseling and coordination of care with the patient

## 2017-08-01 NOTE — Patient Instructions (Addendum)
Your physician has requested that you go to the basement for the following lab work before leaving today: Pantoprazole 40 mg daily  Please purchase the following medications over the counter and take as directed: Miralax 9 grams daily  Please follow up with Dr Hilarie Fredrickson in 1 year.  If you are age 77 or older, your body mass index should be between 23-30. Your Body mass index is 28.91 kg/m. If this is out of the aforementioned range listed, please consider follow up with your Primary Care Provider.  If you are age 7 or younger, your body mass index should be between 19-25. Your Body mass index is 28.91 kg/m. If this is out of the aformentioned range listed, please consider follow up with your Primary Care Provider.

## 2017-08-10 ENCOUNTER — Other Ambulatory Visit (HOSPITAL_COMMUNITY): Payer: Self-pay | Admitting: Nurse Practitioner

## 2017-08-11 ENCOUNTER — Telehealth (HOSPITAL_COMMUNITY): Payer: Self-pay | Admitting: *Deleted

## 2017-08-11 MED ORDER — DILTIAZEM HCL ER COATED BEADS 120 MG PO CP24: 120.0000 mg | ORAL_CAPSULE | Freq: Every day | ORAL | 6 refills | Status: DC

## 2017-08-11 NOTE — Telephone Encounter (Signed)
Patient called in stating she is having difficulty with fatigue, weakness. She is also concerned b/c she cannot have her allergy shots due to being on a beta blocker. Discussed with Roderic Palau NP -- will change metoprolol to cardizem 120mg  once a day. Pt will call with how she is feeling after changing from metoprolol to cardizem. Pt verbalized understanding of instructions.

## 2017-08-25 ENCOUNTER — Encounter: Payer: Self-pay | Admitting: Cardiology

## 2017-08-25 ENCOUNTER — Ambulatory Visit: Payer: Medicare Other | Admitting: Cardiology

## 2017-08-25 VITALS — BP 164/92 | HR 61 | Ht 67.5 in | Wt 184.8 lb

## 2017-08-25 DIAGNOSIS — G4733 Obstructive sleep apnea (adult) (pediatric): Secondary | ICD-10-CM | POA: Diagnosis not present

## 2017-08-25 DIAGNOSIS — R079 Chest pain, unspecified: Secondary | ICD-10-CM | POA: Diagnosis not present

## 2017-08-25 DIAGNOSIS — R55 Syncope and collapse: Secondary | ICD-10-CM | POA: Diagnosis not present

## 2017-08-25 DIAGNOSIS — I480 Paroxysmal atrial fibrillation: Secondary | ICD-10-CM | POA: Diagnosis not present

## 2017-08-25 NOTE — Progress Notes (Signed)
Cardiology Office Note:    Date:  08/25/2017   ID:  Kimberly [name], DOB 06-25-1940, MRN [id number]  PCP:  Lajean Manes, MD  Cardiologist:  Fransico Him, MD    Referring MD: Lajean Manes, MD   Chief Complaint  Patient presents with  . Sleep Apnea  . Chest Pain  . Atrial Fibrillation    History of Present Illness:    Kimberly [name] is a 77 y.o. female with a hx of moderate OSA with an AHI of 16/hr and oxygen saturations as low as 75% and <89% for 12 minutes and is now on CPAP.  She recently was seen by my extender after an ER visit for syncope.  She apparently got up to go the bathroom in the middle the night and then lost consciousness.  She had chest pain afterwards that resolved with nitroglycerin and aspirin.  Evaluation in the ER including troponin and CT were unremarkable.  She has a history of vasovagal syncope remotely in 2017.  The patient states that her syncopal episode this last time was likely due to her not staying hydrated enough.  Her potassium was also low in the ER 3.2.  Event monitor showed paroxysmal atrial fibrillation less than 1% of the time.  2D echocardiogram showed normal LV function with EF 60-65% with grade 2 diastolic dysfunction, trivial MR, mild TR.  Nuclear stress test showed no inducible ischemia.  She is here today for followup and is doing well.  She still occasionally has some tightness in her chest at random times but nuclear stress test was normal and she thinks it is related to anxiety.  She denies any SOB, DOE, PND, orthopnea,  dizziness, palpitations or syncope. She has chronic LE edema intermittently at the end of the day.  She is compliant with her meds and is tolerating meds with no SE.  She is not using her PAP device and never went to Dr. Toy Cookey to decide on an oral device due to her husband's illness. She still wants to pursue this.  She has a lot of anxiety around her husband's illness and Dr. Felipa Eth recently prescribed anxiolytic but she  has not started it.  She recently had a UA showing microscopic hematuria and is seeing a Dealer today and is extremely anxious over this.     Past Medical History:  Diagnosis Date  . Allergy   . Anal polyp 1998   Flex Sig   . Anemia   . Angular blepharitis of left eye   . Ankle fracture    Stress fracture  . Anxiety   . Asthma    border line has inhaler  . Atrial fibrillation ([place])   . Bunion   . Cataracts, bilateral   . Chills (without fever)   . Corneal scar    left eye  . Degenerative disc disease   . Depression   . Diverticulosis of colon (without mention of hemorrhage) 2010   Colonoscopy  . Dry eyes    bilateral  . Enterocele   . External hemorrhoids 2000   Colonoscopy  . Family history of malignant neoplasm of gastrointestinal tract   . Female cystocele   . Fibromyalgia   . GERD (gastroesophageal reflux disease)   . GERD (gastroesophageal reflux disease)   . Hiatal hernia 2005,2010   EGD  . History of bronchitis   . History of measles   . History of mumps   . History of strep sore throat   . History of urinary  tract infection   . Hyperlipemia   . Hypothyroidism   . IBS (irritable bowel syndrome)   . Imbalance   . Internal hemorrhoids without mention of complication [id number]   Colonoscopy   . Irregular heart beat   . Itching   . Lacunar stroke   . Menopause   . Migraine   . OSA (obstructive sleep apnea) 05/14/2015  . Osteopenia 10/2013   T score -1.6 FRAX 10%/1.6%  . Pancreatitis   . PCO (posterior capsular opacification)    left  . Pneumonia    childhood illness  . PONV (postoperative nausea and vomiting)   . Pseudophakia, both eyes   . PVD (posterior vitreous detachment) right  . Rash    on back   . Retinal scar    left  . Rotator cuff disorder    pain, left shoulder  . Shingles   . Stricture and stenosis of esophagus 2005,2010   EGD   . Stroke ([place])   . Ulcerative colitis ([place])   . Varicose veins   . Vertigo   . Wears glasses       Past Surgical History:  Procedure Laterality Date  . ABDOMINAL HYSTERECTOMY    . ANAL FISSURE REPAIR    . BREAST EXCISIONAL BIOPSY Left   . BUNIONECTOMY  2014  . CATARACT EXTRACTION Bilateral 2013  . COLONOSCOPY     polyp removed  . ESOPHAGEAL DILATION    . EYE SURGERY Left   . FOOT SURGERY     left   . HEMORRHOID SURGERY    . MOUTH SURGERY  11/13/11   Cyst removed from gum-benign   . NASAL SEPTUM SURGERY    . NASAL SEPTUM SURGERY  1970's  . REPLACEMENT TOTAL KNEE Right 2011  . TONSILLECTOMY    . TOTAL ABDOMINAL HYSTERECTOMY W/ BILATERAL SALPINGOOPHORECTOMY  1991   TAH BSO  . TOTAL HIP ARTHROPLASTY     right   . TOTAL KNEE ARTHROPLASTY     both  . TOTAL KNEE ARTHROPLASTY Left 06/23/2015   Procedure: LEFT TOTAL KNEE ARTHROPLASTY;  Surgeon: Gaynelle Arabian, MD;  Location: [facility];  Service: Orthopedics;  Laterality: Left;    Current Medications: Current Meds  Medication Sig  . acetaminophen (TYLENOL) 500 MG tablet Take 500 mg by mouth every 6 (six) hours as needed for moderate pain.   . Calcium Carbonate-Vitamin D (CALTRATE 600+D) 600-400 MG-UNIT tablet Take 1 tablet by mouth 2 (two) times daily.  Marland Kitchen diltiazem (CARDIZEM CD) 120 MG 24 hr capsule Take 1 capsule (120 mg total) by mouth at bedtime.  Marland Kitchen ELIQUIS 5 MG TABS tablet TAKE 1 TABLET BY MOUTH TWICE A DAY  . EPINEPHrine 0.3 mg/0.3 mL IJ SOAJ injection Inject 0.3 mg into the muscle as needed (for allergic reaction). Reported on 05/28/2015   . fexofenadine (ALLEGRA) 180 MG tablet Take 180 mg by mouth daily.  . fluticasone (FLONASE) 50 MCG/ACT nasal spray Place 2 sprays into both nostrils every other day.   . levothyroxine (SYNTHROID) 100 MCG tablet Take 100 mcg by mouth daily before breakfast.  . LORazepam (ATIVAN) 0.5 MG tablet Take half (1/2) tablet (0.25 mg) by mouth every 8 hours as needed for anxiety for 8 days.  . montelukast (SINGULAIR) 10 MG tablet Take 1 tablet by mouth at bedtime.   . ondansetron (ZOFRAN) 4 MG tablet  Take 4 mg by mouth every 8 (eight) hours as needed for nausea/vomiting. (For 10 days)  . pantoprazole (PROTONIX) 40 MG tablet Take 1  tablet (40 mg total) by mouth daily.  . Polyethylene Glycol 3350 (MIRALAX PO) Dissolve 9 grams in at least 8 ounces water/juice and drink once daily as needed for constipation.  Marland Kitchen PROAIR HFA 108 (90 Base) MCG/ACT inhaler TAKE 2 PUFFS AS NEEDED EVERY 4-6 HOURS AS NEEDED COUGH/WHEEZE INHALATION 90  . Probiotic Product (ALIGN) 4 MG CAPS Take 1 capsule by mouth daily.  . RESTASIS 0.05 % ophthalmic emulsion Place 1 drop into both eyes 2 (two) times daily. Uses as directed  . rosuvastatin (CRESTOR) 10 MG tablet TAKE ONE HALF TABLET ONCE A DAY AT BEDTIME ORALLY 90 DAY(S)     Allergies:   Azithromycin; Erythromycin; Almond oil; Amoxicillin-pot clavulanate; Atorvastatin; Codeine; Ketoconazole; Ketoconazole; Latex; Lincomycin; Morphine; Morphine sulfate; Other; Shellfish allergy; Simvastatin; Sulfamethoxazole-trimethoprim; Clindamycin; Clindamycin/lincomycin; and Erythromycin base   Social History   Socioeconomic History  . Marital status: Married    Spouse name: Not on file  . Number of children: 1  . Years of education: bus.school  . Highest education level: Not on file  Occupational History  . Occupation: Retired    Fish farm manager: RETIRED  Social Needs  . Financial resource strain: Not on file  . Food insecurity:    Worry: Not on file    Inability: Not on file  . Transportation needs:    Medical: Not on file    Non-medical: Not on file  Tobacco Use  . Smoking status: Former Smoker    Packs/day: 0.25    Years: 15.00    Pack years: 3.75    Types: Cigarettes    Last attempt to quit: 06/01/1975    Years since quitting: 42.2  . Smokeless tobacco: Never Used  . Tobacco comment: Stopped smoking age 82  Substance and Sexual Activity  . Alcohol use: No    Alcohol/week: 0.0 oz  . Drug use: No  . Sexual activity: Never    Birth control/protection: Surgical     Comment: HYST, intecourse age unknown, sexual partner less than 5  Lifestyle  . Physical activity:    Days per week: Not on file    Minutes per session: Not on file  . Stress: Not on file  Relationships  . Social connections:    Talks on phone: Not on file    Gets together: Not on file    Attends religious service: Not on file    Active member of club or organization: Not on file    Attends meetings of clubs or organizations: Not on file    Relationship status: Not on file  Other Topics Concern  . Not on file  Social History Narrative   Daily caffeine      Family History: The patient's family history includes Alcohol abuse in her father; Allergies in her sister; Alzheimer's disease in her mother; Arthritis in her brother; Colon cancer in her paternal aunt; Congestive Heart Failure in her father; Dementia in her mother; Diabetes in her sister; Heart attack in her maternal grandfather, maternal grandmother, and paternal grandfather; Heart disease in her maternal grandmother; Hypertension in her father; Irritable bowel syndrome in her mother; Other in her sister and sister; Stroke in her mother. There is no history of Esophageal cancer, Rectal cancer, or Stomach cancer.  ROS:   Please see the history of present illness.    Review of Systems  Respiratory: Positive for cough.   Musculoskeletal: Positive for back pain and muscle cramps.  Gastrointestinal: Positive for abdominal pain.  Neurological: Positive for dizziness.    All  other systems reviewed and negative.   EKGs/Labs/Other Studies Reviewed:    The following studies were reviewed today: PAP download  EKG:  EKG is not ordered today.  Recent Labs: 07/21/2017: ALT 16; BUN 12; Creatinine, Ser 0.69; Hemoglobin 14.1; Platelets 236; Potassium 3.6; Sodium 138   Recent Lipid Panel    Component Value Date/Time   CHOL 124 07/23/2013 0632   TRIG 69 07/23/2013 0632   HDL 68 07/23/2013 0632   CHOLHDL 1.8 07/23/2013 0632   VLDL  14 07/23/2013 0632   LDLCALC 42 07/23/2013 6295    Physical Exam:    VS:  BP (!) 164/92   Pulse 61   Ht 5' 7.5" (1.715 m)   Wt 184 lb 12.8 oz (83.8 kg)   LMP  (LMP Unknown)   BMI 28.52 kg/m     Wt Readings from Last 3 Encounters:  08/25/17 184 lb 12.8 oz (83.8 kg)  08/01/17 184 lb 9.6 oz (83.7 kg)  07/28/17 184 lb (83.5 kg)     GEN:  Well nourished, well developed in no acute distress HEENT: Normal NECK: No JVD; No carotid bruits LYMPHATICS: No lymphadenopathy CARDIAC: RRR, no murmurs, rubs, gallops RESPIRATORY:  Clear to auscultation without rales, wheezing or rhonchi  ABDOMEN: Soft, non-tender, non-distended MUSCULOSKELETAL:  No edema; No deformity  SKIN: Warm and dry NEUROLOGIC:  Alert and oriented x 3 PSYCHIATRIC:  Normal affect  Is healed and are if she should have a download ASSESSMENT:    1. Syncope, unspecified syncope type   2. OSA (obstructive sleep apnea)   3. Chest pain, unspecified type   4. PAF (paroxysmal atrial fibrillation) (HCC)    PLAN:    In order of problems listed above:  1.  Syncope -she has not had any further episodes of syncope or dizziness.  Workup including event monitor, nuclear stress test and 2D echocardiogram were normal except for some short runs of paroxysmal atrial fibrillation.  Syncopal episode likely due to dehydration.  2.  OSA -she is not using her CPAP and would like to be referred to Dr. Toy Cookey for evaluation for oral device.  3.  Chest pain recent nuclear stress test showed no ischemia.  She has not had any further chest pain.  Nuclear stress test showed no inducible ischemia.  4.  PAF -paroxysmal atrial fibrillation was noted on recent event monitor less than 1% of total recording time.  She was seen in atrial fibrillation clinic and started on metoprolol 25 mg twice daily which was decreased to 12.5 mg twice daily due to intermittent bradycardia.  She will continue on Eliquis 5 mg twice daily for CHADS2VASC score of 5.  Her  creatinine was normal at 0.69 on 07/21/2017 and hemoglobin normal at 14.   Medication Adjustments/Labs and Tests Ordered: Current medicines are reviewed at length with the patient today.  Concerns regarding medicines are outlined above.  No orders of the defined types were placed in this encounter.  No orders of the defined types were placed in this encounter.   Signed, Fransico Him, MD  08/25/2017 10:42 AM    [place]

## 2017-08-25 NOTE — Patient Instructions (Signed)
Medication Instructions:  Your physician recommends that you continue on your current medications as directed. Please refer to the Current Medication list given to you today.  If you need a refill on your cardiac medications, please contact your pharmacy first.  Labwork: None ordered   Testing/Procedures: None ordered   Follow-Up: You have been referred to Dr. Toy Cookey for oral device.   Your physician wants you to follow-up in: 6 months with PA. You will receive a reminder letter in the mail two months in advance. If you don't receive a letter, please call our office to schedule the follow-up appointment.  Your physician wants you to follow-up in: 1 year with Dr. Radford Pax. You will receive a reminder letter in the mail two months in advance. If you don't receive a letter, please call our office to schedule the follow-up appointment.  Any Other Special Instructions Will Be Listed Below (If Applicable).   Thank you for choosing [facility], RN  [phone]  If you need a refill on your cardiac medications before your next appointment, please call your pharmacy.

## 2017-09-01 ENCOUNTER — Telehealth: Payer: Self-pay

## 2017-09-01 NOTE — Telephone Encounter (Signed)
Azucena Fallen, RN        Referral faxed to Dr Augustina Mood and her office will call pt to schedule appt.

## 2017-10-03 ENCOUNTER — Telehealth: Payer: Self-pay | Admitting: Cardiology

## 2017-10-03 NOTE — Telephone Encounter (Signed)
Clarified with Butch Penny [facility]). Butch Penny called Dr. Corky Sing office to see if the patient scheduled her consult. Dr. Corky Sing receptionist said she left several messages and the patient hasn't called back.  Butch Penny called the patient, who stated she has been busy but will call Dr. Corky Sing office this afternoon to arrange appointment sometime in June.   During the conversation, the patient told Butch Penny to tell Dr. Radford Pax she is not interested in pelvic therapy for her bladder issues at this time. She is too busy with her husband (he is sick with cancer). She did have a bladder procedure which helped symptoms, but she does not know the procedure name. She wanted Dr. Radford Pax and her nurse to know as FYI.

## 2017-10-03 NOTE — Telephone Encounter (Signed)
New message      Called Kimberly [name] to get updated info on appt with Dr Toy Cookey.  Patient stated that she has not made appt with Dr Toy Cookey but will call her today to schedule appt in June.  Patient asked me to tell Dr Radford Pax that she did not want the pelvic therapy because she cannot afford it and her husband has cancer and it is taking up all of her time.  Patient also want Dr Radford Pax to know that she is doing better and is resting more.  She also had a bladder procedure and it seems to be helping.  Patient is scheduled to see Dr Felipa Eth on 10-04-17 for her wellness check-up.

## 2017-10-05 ENCOUNTER — Telehealth: Payer: Self-pay | Admitting: *Deleted

## 2017-10-05 ENCOUNTER — Telehealth: Payer: Self-pay

## 2017-10-05 NOTE — Telephone Encounter (Signed)
Patient has been referred to urology seeing Dr.Winters, by PCP Dr.Stoneking due to blood in urine,he prescribed premarin cream for her to use due to vaginally dryness, and irritation. She is scheduled to go back and recheck urine today at Dr.Winters. She wanted to know your thoughts about using premarin cream if you think it would be a good idea, and safe. Please advise

## 2017-10-05 NOTE — Telephone Encounter (Signed)
Azucena Fallen, RN        Appt made with Dr Augustina Mood on 11-16-17 at 11:30. Pt delayed appt because husband is sick.

## 2017-10-05 NOTE — Telephone Encounter (Signed)
Pt informed

## 2017-10-05 NOTE — Telephone Encounter (Signed)
The only issue with vaginal estrogen cream is if the patient would absorb enough to get a systemic level in the bloodstream which could lead to breast stimulation and slight increased risk of blood clots.  Risk of this is very low.

## 2017-11-09 ENCOUNTER — Telehealth: Payer: Self-pay

## 2017-11-09 NOTE — Telephone Encounter (Signed)
Spoke to Ms. Rosana Berger about the [employer] for [facility] [employer] group.  She lives closer to [employer] and would like to do PREP at [facility].  She will come in 11/28/17 at noon to enroll.

## 2017-11-15 ENCOUNTER — Telehealth: Payer: Self-pay | Admitting: *Deleted

## 2017-11-15 NOTE — Telephone Encounter (Signed)
   [facility] Pre-operative Risk Assessment    Request for surgical clearance:  1. What type of surgery is being performed? PERIANAL BIOFEEDBACK    2. When is this surgery scheduled? TBD    3. What type of clearance is required (medical clearance vs. Pharmacy clearance to hold med vs. Both)? BOTH  4. Are there any medications that need to be held prior to surgery and how long? NONE    5. Practice name and name of physician performing surgery? [facility]    6. What is your office phone number  [phone]  EXT [phone]   7.   What is your office fax number  [phone]   8.   Anesthesia type (None, local, MAC, general) ? NONE   Kimberly [name] M 11/15/2017, 2:00 PM  _________________________________________________________________   (provider comments below)

## 2017-11-16 ENCOUNTER — Telehealth: Payer: Self-pay | Admitting: Cardiology

## 2017-11-16 NOTE — Telephone Encounter (Signed)
   Primary Cardiologist: Fransico Him, MD  Chart reviewed as part of pre-operative protocol coverage. Patient was contacted 11/16/2017 in reference to pre-operative risk assessment for pending surgery as outlined below.  DARRELL LEONHARDT was last seen on 08/25/17 by Dr. Radford Pax.  Since that day, CINDRA AUSTAD has done well with no new cardiac complaints. She has a history of syncope thought to be related to dehydration. She also has paroxysmal atrial fibrillation and is on low dose metoprolol with good control. She has has some bradycardia and care should be taken to avoid vagal stimulation that could cause vasovagal bradycardia and possible syncope.  There was no request to hold her anticoagulant. If the Eliquis needs to be held please communicate this to [place].   Therefore, based on ACC/AHA guidelines, the patient would be at acceptable risk for the planned procedure without further cardiovascular testing.   I will route this recommendation to the requesting party via Epic fax function and remove from pre-op pool.  Please call with questions.  Daune Perch, NP 11/16/2017, 1:59 PM

## 2017-11-16 NOTE — Telephone Encounter (Signed)
Opened in error

## 2017-11-16 NOTE — Telephone Encounter (Signed)
Clearance request states no medications need to be held prior to procedure. Please clarify if pt will need to stop her Eliquis for her procedure.

## 2017-11-28 ENCOUNTER — Telehealth: Payer: Self-pay | Admitting: Cardiology

## 2017-11-28 DIAGNOSIS — G4733 Obstructive sleep apnea (adult) (pediatric): Secondary | ICD-10-CM

## 2017-11-28 NOTE — Telephone Encounter (Signed)
New Message,    Patient is calling because she has an appointment for her to see Dr. Toy Cookey. She has decided not to see Dr. Toy Cookey and go with the CPAP machine but she wants to discuss this further. Please call.

## 2017-11-28 NOTE — Telephone Encounter (Signed)
Patient needs to go back on CPAP

## 2017-11-28 NOTE — Telephone Encounter (Signed)
Patient was advised by Dr Oren Binet at [facility] office not to consider getting the oral appliance but to consider revisiting the cpap because of her extensive dental work (implants, braces, bridges) and it can cause alignment and bite problems. 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 Patient would like to try the CPAP.

## 2017-12-07 NOTE — Telephone Encounter (Signed)
Patient states the problem is not her cpap but the mask. She can not find a mask that will fit. Patient wonders if there is a mask that will fit her. [facility] states the patient is not eligible for a new cpap until 2021 and must pay out of pocket for her supplies until she has been compliant for 21 of 30 days for 4 or more hours, have a new office visit to discuss usage and benefits and get a new Rx to be able to return back on cpap.

## 2017-12-08 NOTE — Telephone Encounter (Signed)
Ok set her up next week for OV

## 2017-12-12 ENCOUNTER — Other Ambulatory Visit: Payer: Self-pay | Admitting: Gynecology

## 2017-12-12 DIAGNOSIS — Z1231 Encounter for screening mammogram for malignant neoplasm of breast: Secondary | ICD-10-CM

## 2017-12-21 NOTE — Telephone Encounter (Signed)
Patient will first use her old cpap unttil she has been compliant for 21 of 30 days and for 4 or more hours then she will come in for an office visit to discuss usage and benefits.

## 2017-12-28 NOTE — Telephone Encounter (Addendum)
Pressure is blowing too hard on 12 cm patient  can't  Breathe and is not wearing her unit please write order to change pressure.

## 2017-12-28 NOTE — Telephone Encounter (Signed)
Please change CPAP to auto from 4 to 14 cm H2O and get a download in 2 weeks

## 2017-12-29 NOTE — Addendum Note (Signed)
Addended by: Freada Bergeron on: 12/29/2017 01:58 PM   Modules accepted: Orders

## 2017-12-29 NOTE — Telephone Encounter (Signed)
Per Dr Radford Pax order faxed to [facility].

## 2018-01-02 NOTE — Telephone Encounter (Signed)
Order sent to change cpap .Marland Kitchen  Please change CPAP to auto from 4 to 14 cm H2O and get a download in 2 weeks.  Per [facility] patient does not have an auto titrating machine, please write an order for a 2 week auto titration w/pressure setting and a 2 week down load. Patient pressure is set on 12 cm.

## 2018-01-11 ENCOUNTER — Ambulatory Visit
Admission: RE | Admit: 2018-01-11 | Discharge: 2018-01-11 | Disposition: A | Discharge status: Home / Self Care | Payer: Medicare Other | Source: Ambulatory Visit | Attending: Gynecology | Admitting: Gynecology

## 2018-01-11 DIAGNOSIS — Z1231 Encounter for screening mammogram for malignant neoplasm of breast: Secondary | ICD-10-CM

## 2018-01-27 NOTE — Telephone Encounter (Signed)
2 week auto titration from 4 to 14 cm H20   And get a 2 week down load

## 2018-01-27 NOTE — Addendum Note (Signed)
Addended by: Freada Bergeron on: 01/27/2018 02:08 PM   Modules accepted: Orders

## 2018-01-31 NOTE — Telephone Encounter (Signed)
Called patient and left message that her titration order has been recent due to a problem with the fax machine.

## 2018-02-01 ENCOUNTER — Telehealth: Payer: Self-pay

## 2018-02-01 NOTE — Telephone Encounter (Signed)
Spoke w/Ms. Fifer who will come in on 9/9 at 10am to complete intake information for the PREP at [facility]

## 2018-02-02 ENCOUNTER — Encounter: Payer: Self-pay | Admitting: Gynecology

## 2018-02-02 ENCOUNTER — Ambulatory Visit: Payer: Medicare Other | Admitting: Gynecology

## 2018-02-02 VITALS — BP 118/76

## 2018-02-02 DIAGNOSIS — R922 Inconclusive mammogram: Secondary | ICD-10-CM

## 2018-02-02 NOTE — Progress Notes (Signed)
    Kimberly [name] Feb 11, 1941 [id number]        77 y.o.  G0P0 presents to discuss her most recent mammogram report.  Patient received a letter that discussed the increased density of her breasts and the state law requiring the patient be informed about her breast density.  It also had a note that she should discuss with her physician whether further testing or studies is necessary.  Patient is having no issues as far as breast tenderness galactorrhea or palpable masses.  Her mammogram report was read as BI-RADS 1 with breast density category C.  Past medical history,surgical history, problem list, medications, allergies, family history and social history were all reviewed and documented in the EPIC chart.  Directed ROS with pertinent positives and negatives documented in the history of present illness/assessment and plan.  Exam: Vitals:   02/02/18 0915  BP: 118/76   General appearance:  Normal   Assessment/Plan:  77 y.o. G0P0 patient and I reviewed her letter and mammogram report.  We discussed the various breast categories from A to D and what this means as far as breast density.  We discussed increased density may obscure more subtle lesions and the question as to whether denser breasts have a higher cancer risk.  She had a 3D mammogram performed.  She does have a history of breast cancer in her sister postmenopausal but no other family members.  At this point I do not believe any further studies are necessary.  She will continue with annual 3D mammography and self breast exams monthly.  She is due for her annual exam in several months and we will do a baseline breast exam at that time as she is not having breast complaints today.  Patient's questions were answered to her satisfaction.  I spent a total of 15 face-to-face minutes with the patient, over 50% was spent counseling and coordination of care.   Anastasio Auerbach MD, 5:02 PM 02/02/2018

## 2018-02-02 NOTE — Patient Instructions (Signed)
Follow-up for your annual exam at the end of this year as scheduled.

## 2018-02-13 NOTE — Progress Notes (Signed)
Cardiology Office Note    Date:  02/14/2018   ID:  Kimberly [name], DOB 1940/06/14, MRN [id number]  PCP:  Lajean Manes, MD  Cardiologist: Fransico Him, MD EPS: None  Chief Complaint  Patient presents with  . Follow-up    History of Present Illness:  Kimberly [name] is a 77 y.o. female with history of PAF on metoprolol and Eliquis for chads vas score of 5.  Metoprolol decreased by [facility] clinic because of bradycardia.  History of chest pain with normal nuclear stress test, syncope felt secondary to dehydration.  Patient comes in for f/u. Trying to get CPAP titration straight. Has a little tightness in chest when she is under a lot of stress. She's able to control her stress much better. Legs get weak after working all day at the [employer] place.Has prolapsed bladder and has hematuria so is seeing a urologist for that. Trouble with allergies.Doing the wellness program through [facility]. Going to [employer] 1 hr twice a week. Has chills across her chest when she is at home but her husband keeps the thermostat at 68 degrees.    Past Medical History:  Diagnosis Date  . Allergy   . Anal polyp 1998   Flex Sig   . Anemia   . Angular blepharitis of left eye   . Ankle fracture    Stress fracture  . Anxiety   . Asthma    border line has inhaler  . Bunion   . Cataracts, bilateral   . Chills (without fever)   . Corneal scar    left eye  . Degenerative disc disease   . Depression   . Diverticulosis of colon (without mention of hemorrhage) 2010   Colonoscopy  . Dry eyes    bilateral  . Enterocele   . External hemorrhoids 2000   Colonoscopy  . Family history of malignant neoplasm of gastrointestinal tract   . Female cystocele   . Fibromyalgia   . GERD (gastroesophageal reflux disease)   . GERD (gastroesophageal reflux disease)   . Hiatal hernia 2005,2010   EGD  . History of bronchitis   . History of measles   . History of mumps   . History of strep sore throat   .  History of urinary tract infection   . Hyperlipemia   . Hypothyroidism   . IBS (irritable bowel syndrome)   . Imbalance   . Internal hemorrhoids without mention of complication 1950,9326   Colonoscopy   . Itching   . 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 Menopause   . Migraine   . OSA (obstructive sleep apnea) 05/14/2015  . Osteopenia 10/2013   T score -1.6 FRAX 10%/1.6%  . PAF (paroxysmal atrial fibrillation) ([place])   . Pancreatitis   . PCO (posterior capsular opacification)    left  . Pneumonia    childhood illness  . PONV (postoperative nausea and vomiting)   . Pseudophakia, both eyes   . PVD (posterior vitreous detachment) right  . Rash    on back   . Retinal scar    left  . Rotator cuff disorder    pain, left shoulder  . Shingles   . Stricture and stenosis of esophagus 2005,2010   EGD   . Stroke ([place])   . Ulcerative colitis ([place])   . Varicose veins   . Vertigo   . Wears glasses     Past Surgical History:  Procedure Laterality Date  . ABDOMINAL HYSTERECTOMY    .  ANAL FISSURE REPAIR    . BREAST EXCISIONAL BIOPSY Left   . BUNIONECTOMY  2014  . CATARACT EXTRACTION Bilateral 2013  . COLONOSCOPY     polyp removed  . ESOPHAGEAL DILATION    . EYE SURGERY Left   . FOOT SURGERY     left   . HEMORRHOID SURGERY    . MOUTH SURGERY  11/13/11   Cyst removed from gum-benign   . NASAL SEPTUM SURGERY    . NASAL SEPTUM SURGERY  1970's  . REPLACEMENT TOTAL KNEE Right 2011  . TONSILLECTOMY    . TOTAL ABDOMINAL HYSTERECTOMY W/ BILATERAL SALPINGOOPHORECTOMY  1991   TAH BSO  . TOTAL HIP ARTHROPLASTY     right   . TOTAL KNEE ARTHROPLASTY     both  . TOTAL KNEE ARTHROPLASTY Left 06/23/2015   Procedure: LEFT TOTAL KNEE ARTHROPLASTY;  Surgeon: Gaynelle Arabian, MD;  Location: [facility];  Service: Orthopedics;  Laterality: Left;    Current Medications: Current Meds  Medication Sig  . acetaminophen (TYLENOL) 500 MG tablet Take 500 mg by mouth every 6 (six) hours as needed for moderate pain.    . Calcium Carbonate-Vitamin D (CALTRATE 600+D) 600-400 MG-UNIT tablet Take 1 tablet by mouth 2 (two) times daily.  Marland Kitchen diltiazem (CARDIZEM CD) 120 MG 24 hr capsule Take 1 capsule (120 mg total) by mouth at bedtime.  Marland Kitchen ELIQUIS 5 MG TABS tablet TAKE 1 TABLET BY MOUTH TWICE A DAY  . EPINEPHrine 0.3 mg/0.3 mL IJ SOAJ injection Inject 0.3 mg into the muscle as needed (for allergic reaction). Reported on 05/28/2015   . fexofenadine (ALLEGRA) 180 MG tablet Take 180 mg by mouth daily.  . fluticasone (FLONASE) 50 MCG/ACT nasal spray Place 2 sprays into both nostrils every other day.   . levothyroxine (SYNTHROID) 100 MCG tablet Take 100 mcg by mouth daily before breakfast.  . montelukast (SINGULAIR) 10 MG tablet Take 1 tablet by mouth at bedtime.   . pantoprazole (PROTONIX) 40 MG tablet Take 1 tablet (40 mg total) by mouth daily.  . Polyethylene Glycol 3350 (MIRALAX PO) Dissolve 9 grams in at least 8 ounces water/juice and drink once daily as needed for constipation.  Marland Kitchen PROAIR HFA 108 (90 Base) MCG/ACT inhaler TAKE 2 PUFFS AS NEEDED EVERY 4-6 HOURS AS NEEDED COUGH/WHEEZE INHALATION 90  . Probiotic Product (ALIGN) 4 MG CAPS Take 1 capsule by mouth daily.  . RESTASIS 0.05 % ophthalmic emulsion Place 1 drop into both eyes 2 (two) times daily. Uses as directed  . rosuvastatin (CRESTOR) 10 MG tablet TAKE ONE HALF TABLET ONCE A DAY AT BEDTIME ORALLY 90 DAY(S)     Allergies:   Azithromycin; Erythromycin; Almond oil; Amoxicillin-pot clavulanate; Atorvastatin; Codeine; Ketoconazole; Ketoconazole; Latex; Lincomycin; Morphine; Morphine sulfate; Other; Shellfish allergy; Simvastatin; Sulfamethoxazole-trimethoprim; Clindamycin; Clindamycin/lincomycin; and Erythromycin base   Social History   Socioeconomic History  . Marital status: Married    Spouse name: Not on file  . Number of children: 1  . Years of education: bus.school  . Highest education level: Not on file  Occupational History  . Occupation: Retired     Fish farm manager: RETIRED  Social Needs  . Financial resource strain: Not on file  . Food insecurity:    Worry: Not on file    Inability: Not on file  . Transportation needs:    Medical: Not on file    Non-medical: Not on file  Tobacco Use  . Smoking status: Former Smoker    Packs/day: 0.25  Years: 15.00    Pack years: 3.75    Types: Cigarettes    Last attempt to quit: 06/01/1975    Years since quitting: 42.7  . Smokeless tobacco: Never Used  . Tobacco comment: Stopped smoking age 31  Substance and Sexual Activity  . Alcohol use: No    Alcohol/week: 0.0 standard drinks  . Drug use: No  . Sexual activity: Never    Birth control/protection: Surgical    Comment: HYST, intecourse age unknown, sexual partner less than 5  Lifestyle  . Physical activity:    Days per week: Not on file    Minutes per session: Not on file  . Stress: Not on file  Relationships  . Social connections:    Talks on phone: Not on file    Gets together: Not on file    Attends religious service: Not on file    Active member of club or organization: Not on file    Attends meetings of clubs or organizations: Not on file    Relationship status: Not on file  Other Topics Concern  . Not on file  Social History Narrative   Daily caffeine      Family History:  The patient's family history includes Alcohol abuse in her father; Allergies in her sister; Alzheimer's disease in her mother; Arthritis in her brother; Colon cancer in her paternal aunt; Congestive Heart Failure in her father; Dementia in her mother; Diabetes in her sister; Heart attack in her maternal grandfather, maternal grandmother, and paternal grandfather; Heart disease in her maternal grandmother; Hypertension in her father; Irritable bowel syndrome in her mother; Other in her sister and sister; Stroke in her mother.   ROS:   Please see the history of present illness.    Review of Systems  Constitution: Negative.  HENT: Positive for congestion.         Seasonal allergies  Eyes: Negative.   Cardiovascular: Positive for chest pain.  Respiratory: Negative.   Hematologic/Lymphatic: Negative.   Musculoskeletal: Positive for arthritis and back pain. Negative for joint pain.  Gastrointestinal: Negative.   Genitourinary: Positive for hematuria.       Prolapsed bladder  Neurological: Negative.   Psychiatric/Behavioral: The patient is nervous/anxious.    All other systems reviewed and are negative.   PHYSICAL EXAM:   VS:  BP 130/80   Pulse 67   Ht 5' 7.5" (1.715 m)   Wt 186 lb 3.2 oz (84.5 kg)   LMP  (LMP Unknown)   SpO2 98%   BMI 28.73 kg/m   Physical Exam  GEN: Well nourished, well developed, in no acute distress  Neck: no JVD, carotid bruits, or masses Cardiac:RRR; 1/6 systolic murmur at left sternal border Respiratory:  clear to auscultation bilaterally, normal work of breathing GI: soft, nontender, nondistended, + BS Ext: without cyanosis, clubbing, or edema, Good distal pulses bilaterally Neuro:  Alert and Oriented x 3 Psych: euthymic mood, full affect  Wt Readings from Last 3 Encounters:  02/14/18 186 lb 3.2 oz (84.5 kg)  08/25/17 184 lb 12.8 oz (83.8 kg)  08/01/17 184 lb 9.6 oz (83.7 kg)      Studies/Labs Reviewed:   EKG:  EKG is not ordered today.   Recent Labs: 07/21/2017: ALT 16; BUN 12; Creatinine, Ser 0.69; Hemoglobin 14.1; Platelets 236; Potassium 3.6; Sodium 138   Lipid Panel    Component Value Date/Time   CHOL 124 07/23/2013 0632   TRIG 69 07/23/2013 0632   HDL 68 07/23/2013 0017  CHOLHDL 1.8 07/23/2013 0632   VLDL 14 07/23/2013 0632   LDLCALC 42 07/23/2013 4196    Additional studies/ records that were reviewed today include:  Nuclear stress test 2/6/2019Study Highlights      Nuclear stress EF: 68%.  There was no ST segment deviation noted during stress.  The study is normal.  This is a low risk study.  The left ventricular ejection fraction is hyperdynamic (>65%).   Normal  pharmacologic nuclear stress test with no evidence for prior infarct or ischemia. Normal LVEF.    2D echo 05/2016 study Conclusions   - Left ventricle: The cavity size was normal. Wall thickness was   normal. Systolic function was normal. The estimated ejection   fraction was in the range of 60% to 65%. Wall motion was normal;   there were no regional wall motion abnormalities. Doppler   parameters are consistent with pseudonormal left ventricular   relaxation (grade 2 diastolic dysfunction). The E/A ratio is   >1.5. The E/e&' ratio is >15, suggesting elevated LV filling   pressure. - Mitral valve: Mildly thickened leaflets . There was trivial   regurgitation. - Left atrium: The atrium was normal in size. - Tricuspid valve: There was mild regurgitation. - Pulmonary arteries: PA peak pressure: 23 mm Hg (S). - Inferior vena cava: The vessel was normal in size. The   respirophasic diameter changes were in the normal range (>= 50%),   consistent with normal central venous pressure.   Impressions:   - LVEF 60-65%, normal wall thickness, normal wall motion, grade 2   DD with elevated LV Filling pressure, trivial MR, mild TR, RVSP   23 mmHg, normal IVC.       ASSESSMENT:    1. PAF (paroxysmal atrial fibrillation) ([place])   2. OSA (obstructive sleep apnea)      PLAN:  In order of problems listed above:  PAF on metoprolol and Eliquis having some hematuria but being followed by urologist and felt secondary to her prolapsed bladder.  She is undergoing therapy.  Creatinine normal and may.  We will give Eliquis samples.  OSA on CPAP having trouble with settings.  We will have Gae Bon speak with her.  History of chest pain normal nuclear stress test 05/2017  History of syncope work-up including event monitor nuclear stress test and 2D echo.  Syncope felt secondary to dehydration.  Medication Adjustments/Labs and Tests Ordered: Current medicines are reviewed at length with the patient  today.  Concerns regarding medicines are outlined above.  Medication changes, Labs and Tests ordered today are listed in the Patient Instructions below. Patient Instructions  Medication Instructions:  Your physician recommends that you continue on your current medications as directed. Please refer to the Current Medication list given to you today.   Labwork: None ordered  Testing/Procedures: None ordered  Follow-Up: Your physician wants you to follow-up in: 6 months with Dr. Radford Pax. You will receive a reminder letter in the mail two months in advance. If you don't receive a letter, please call our office to schedule the follow-up appointment.   Any Other Special Instructions Will Be Listed Below (If Applicable).     If you need a refill on your cardiac medications before your next appointment, please call your pharmacy.      Sumner Boast, PA-C  02/14/2018 11:09 AM    [facility] [address], [place], [place]  [address] Phone: [phone]; Fax: [phone]

## 2018-02-14 ENCOUNTER — Encounter: Payer: Self-pay | Admitting: Physician Assistant

## 2018-02-14 ENCOUNTER — Ambulatory Visit: Payer: Medicare Other | Admitting: Physician Assistant

## 2018-02-14 ENCOUNTER — Ambulatory Visit: Payer: Medicare Other | Admitting: Cardiology

## 2018-02-14 ENCOUNTER — Telehealth: Payer: Self-pay | Admitting: *Deleted

## 2018-02-14 VITALS — BP 130/80 | HR 67 | Ht 67.5 in | Wt 186.2 lb

## 2018-02-14 DIAGNOSIS — G4733 Obstructive sleep apnea (adult) (pediatric): Secondary | ICD-10-CM

## 2018-02-14 DIAGNOSIS — I480 Paroxysmal atrial fibrillation: Secondary | ICD-10-CM

## 2018-02-14 NOTE — Patient Instructions (Signed)

## 2018-02-14 NOTE — Telephone Encounter (Signed)
Patient in the office today seeing Ermalinda Barrios and asked to see me inquiring about her 2 week auto titration that was ordered 8/30 and she still has not heard anything from [facility]. I reached out to [facility] spoke to [place] who was able to assure me the order was being processed today. The order was pulled out of epic by Corene Cornea and I sent a ASAP fax to the [phone] number.

## 2018-02-21 NOTE — Progress Notes (Signed)
[place] Report   Patient Details  Name: Kimberly [name] MRN: [id number] Date of Birth: 09-26-1940 Age: 77 y.o. PCP: Lajean Manes, MD  Vitals:   02/21/18 1054  BP: (!) 148/82  Pulse: (!) 57  SpO2: 98%  Weight: 185 lb 9.6 oz (84.2 kg)     [facility] Eval - 02/21/18 1000      Referral    Referring Provider  AFIB clinic    Reason for referral  Hypertension;High Cholesterol;Inactivity;Orthopedic;Other    Program Start Date  02/22/18      Measurement   Waist Circumference  38 inches    Hip Circumference  43 inches    Body fat  44.9 percent      Information for Trainer   Goals  "to gain strength, endurance, confidence, to lose 10lbs"    Current Exercise  "ADL's, walking"    Orthopedic Concerns  back, foot (bunion/trigger toes)    Pertinent Medical History  anxiety, afib, prolapsed bladder, allergies, htn    Current Barriers  none    Restrictions/Precautions  Fall risk    Medications that affect exercise  Asthma inhaler;Medication causing dizziness/drowsiness      Timed Up and Go (TUGS)   Timed Up and Go  Moderate risk 10-12 seconds   10.01     Mobility and Daily Activities   I find it easy to walk up or down two or more flights of stairs.  3    I have no trouble taking out the trash.  3    I do housework such as vacuuming and dusting on my own without difficulty.  4    I can easily lift a gallon of milk (8lbs).  4    I can easily walk a mile.  3    I have no trouble reaching into high cupboards or reaching down to pick up something from the floor.  4    I do not have trouble doing out-door work such as Armed forces logistics/support/administrative officer, raking leaves, or gardening.  3      Mobility and Daily Activities   I feel younger than my age.  4    I feel independent.  4    I feel energetic.  3    I live an active life.   3    I feel strong.  3    I feel healthy.  3    I feel active as other people my age.  4      How fit and strong are you.   Fit and Strong Total Score   48      Past Medical History:  Diagnosis Date  . Allergy   . Anal polyp 1998   Flex Sig   . Anemia   . Angular blepharitis of left eye   . Ankle fracture    Stress fracture  . Anxiety   . Asthma    border line has inhaler  . Bunion   . Cataracts, bilateral   . Chills (without fever)   . Corneal scar    left eye  . Degenerative disc disease   . Depression   . Diverticulosis of colon (without mention of hemorrhage) 2010   Colonoscopy  . Dry eyes    bilateral  . Enterocele   . External hemorrhoids 2000   Colonoscopy  . Family history of malignant neoplasm of gastrointestinal tract   . Female cystocele   . Fibromyalgia   . GERD (gastroesophageal reflux disease)   .  GERD (gastroesophageal reflux disease)   . Hiatal hernia 2005,2010   EGD  . History of bronchitis   . History of measles   . History of mumps   . History of strep sore throat   . History of urinary tract infection   . Hyperlipemia   . Hypothyroidism   . IBS (irritable bowel syndrome)   . Imbalance   . Internal hemorrhoids without mention of complication 2778,2423   Colonoscopy   . Itching   . 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 Menopause   . Migraine   . OSA (obstructive sleep apnea) 05/14/2015  . Osteopenia 10/2013   T score -1.6 FRAX 10%/1.6%  . PAF (paroxysmal atrial fibrillation) ([place])   . Pancreatitis   . PCO (posterior capsular opacification)    left  . Pneumonia    childhood illness  . PONV (postoperative nausea and vomiting)   . Pseudophakia, both eyes   . PVD (posterior vitreous detachment) right  . Rash    on back   . Retinal scar    left  . Rotator cuff disorder    pain, left shoulder  . Shingles   . Stricture and stenosis of esophagus 2005,2010   EGD   . Stroke ([place])   . Ulcerative colitis ([place])   . Varicose veins   . Vertigo   . Wears glasses    Past Surgical History:  Procedure Laterality Date  . ABDOMINAL HYSTERECTOMY    . ANAL FISSURE REPAIR    . BREAST EXCISIONAL BIOPSY  Left   . BUNIONECTOMY  2014  . CATARACT EXTRACTION Bilateral 2013  . COLONOSCOPY     polyp removed  . ESOPHAGEAL DILATION    . EYE SURGERY Left   . FOOT SURGERY     left   . HEMORRHOID SURGERY    . MOUTH SURGERY  11/13/11   Cyst removed from gum-benign   . NASAL SEPTUM SURGERY    . NASAL SEPTUM SURGERY  1970's  . REPLACEMENT TOTAL KNEE Right 2011  . TONSILLECTOMY    . TOTAL ABDOMINAL HYSTERECTOMY W/ BILATERAL SALPINGOOPHORECTOMY  1991   TAH BSO  . TOTAL HIP ARTHROPLASTY     right   . TOTAL KNEE ARTHROPLASTY     both  . TOTAL KNEE ARTHROPLASTY Left 06/23/2015   Procedure: LEFT TOTAL KNEE ARTHROPLASTY;  Surgeon: Gaynelle Arabian, MD;  Location: [facility];  Service: Orthopedics;  Laterality: Left;   Social History   Tobacco Use  Smoking Status Former Smoker  . Packs/day: 0.25  . Years: 15.00  . Pack years: 3.75  . Types: Cigarettes  . Last attempt to quit: 06/01/1975  . Years since quitting: 42.7  Smokeless Tobacco Never Used  Tobacco Comment   Stopped smoking age 6    The 98 week PREP will begin on Sept 25 and run every Wed/Fri from 12:30-1:30.  Vanita Ingles 02/21/2018, 10:58 AM

## 2018-02-23 ENCOUNTER — Other Ambulatory Visit (HOSPITAL_COMMUNITY): Payer: Self-pay | Admitting: Nurse Practitioner

## 2018-02-27 ENCOUNTER — Encounter

## 2018-02-28 NOTE — Telephone Encounter (Addendum)
Per Vicente Males)  respiratory at [facility] could you please change pressure FROM 4-14   TO   4-10  or  5-10 because her pressure is too high on her auto titration unit. Patient states she can not use her 2 week auto unit. Patient has only used it for 2 hours since she has had it. Patient would like to take her machine with her on vacation leaving on 03/06/18 and returning 03/13/18.

## 2018-03-01 NOTE — Addendum Note (Signed)
Addended by: Freada Bergeron on: 03/01/2018 12:18 PM   Modules accepted: Orders

## 2018-03-01 NOTE — Telephone Encounter (Signed)
Orders faxed to [facility].  

## 2018-03-01 NOTE — Telephone Encounter (Signed)
RE: change pressure  Turner, Eber Hong, MD  Freada Bergeron, CMA        Change auto CPAP to 5 - 10cm H2O and get a download in 2 weeks   Fransico Him

## 2018-03-01 NOTE — Telephone Encounter (Signed)
RE: Still having trouble  Freada Bergeron, CMA  Sueanne Margarita, MD        Starting at 4 it takes 10 minutes to get up to 12. I will fax the new order. Thanks     ----- Message -----  From: Sueanne Margarita, MD  Sent: 02/26/2018  2:40 PM EDT  To: Freada Bergeron, CMA  Subject: RE: Still having trouble             Please find out from DME what is the time frame from the ramp starting at 4 and getting up to final pressure   Traci  ----- Message -----  From: Freada Bergeron, CMA  Sent: 02/24/2018  9:37 AM EDT  To: Sueanne Margarita, MD  Subject: Still having trouble               Patient states the air is blowing too strong on the 2 week auto titration on 4-14. Starts out at 4 and the ramp gets to be too much. Her own unit is on 12 and that is too much. Patient did use the machine one night for 4 hours and last only 3 hours then it was blowing too strong to wear.  Please advise

## 2018-03-09 NOTE — Telephone Encounter (Signed)
Patient will return her 2 week autotitration on 03/27/18 and at that time we will get a download.

## 2018-04-04 ENCOUNTER — Telehealth: Payer: Self-pay | Admitting: *Deleted

## 2018-04-04 NOTE — Telephone Encounter (Signed)
Patient says she falls asleep on her back but she moves to her side. Patient states she spoke to [facility] and they re-set her EPR to 3 and now she sleeps 5 to 71/2 hours. Patient says she is doing great and feels great since the change but wonders if the settings are ok for her health wise. Patient stated she has increased her compliance.

## 2018-04-04 NOTE — Telephone Encounter (Signed)
-----   Message from Sueanne Margarita, MD sent at 03/26/2018  2:43 PM EDT ----- AHI mildly elevated - please find out if patient is sleeping supine - also find out if she is tolerating PAP better as she needs to increase compliance

## 2018-04-19 ENCOUNTER — Ambulatory Visit: Payer: Medicare Other | Admitting: Cardiology

## 2018-04-19 VITALS — BP 140/90 | HR 72 | Ht 67.5 in | Wt 185.0 lb

## 2018-04-19 DIAGNOSIS — I480 Paroxysmal atrial fibrillation: Secondary | ICD-10-CM

## 2018-04-19 DIAGNOSIS — R55 Syncope and collapse: Secondary | ICD-10-CM | POA: Diagnosis not present

## 2018-04-19 DIAGNOSIS — G4733 Obstructive sleep apnea (adult) (pediatric): Secondary | ICD-10-CM | POA: Diagnosis not present

## 2018-04-19 NOTE — Patient Instructions (Addendum)
Medication Instructions:  Your physician recommends that you continue on your current medications as directed. Please refer to the Current Medication list given to you today.  If you need a refill on your cardiac medications before your next appointment, please call your pharmacy.   Lab work: Today: BMET  If you have labs (blood work) drawn today and your tests are completely normal, you will receive your results only by: Marland Kitchen MyChart Message (if you have MyChart) OR . A paper copy in the mail If you have any lab test that is abnormal or we need to change your treatment, we will call you to review the results.  Testing/Procedures:  BiPAP titration  Follow-Up: After BiPAP Titration

## 2018-04-19 NOTE — Addendum Note (Signed)
Addended by: Sarina Ill on: 04/19/2018 01:28 PM   Modules accepted: Orders

## 2018-04-19 NOTE — Progress Notes (Signed)
Cardiology Office Note:    Date:  04/19/2018   ID:  Kimberly [name], DOB Feb 01, 1941, MRN [id number]  PCP:  Lajean Manes, MD  Cardiologist:  Fransico Him, MD    Referring MD: Lajean Manes, MD   Chief Complaint  Patient presents with  . Sleep Apnea  . Hypertension  . Atrial Fibrillation    History of Present Illness:    Kimberly [name] is a 77 y.o. female with a hx of PAF on metoprolol and Eliquis for chads vas score of 5.  Metoprolol decreased by [facility] because of bradycardia.  She also has a history of chest pain with normal nuclear stress test and  syncope felt secondary to dehydration.  She saw Amie Portland back in September and had some problems with hematuria that was felt secondary to prolapsed bladder and was being followed by urologist.  She 3 of obstructive sleep apnea on CPAP and was having problems with her device as well.  She was complaining that the pressure was too high on her auto device and had not been using it.  Her device was changed to 5 to 10 cm H2O on auto.  Her EPR was also set at 3 and she called back stating she was doing much better sleeping 5 to 7-1/2 hours nightly and feeling much better.  She is here today for followup and is doing well.  She denies any chest pain or pressure, SOB, DOE, PND, orthopnea, LE edema, dizziness, palpitations or syncope. She is compliant with her meds and is tolerating meds with no SE.  She is still having problems with her CPAP device.  Despite several changes on her device she is still not tolerating the pressure and feels like it is too much.  I suspect she is uncomfortable when she is exhaling against a higher Pap pressure.  Her AHI remains elevated.  She is quite frustrated.  Past Medical History:  Diagnosis Date  . Allergy   . Anal polyp 1998   Flex Sig   . Anemia   . Angular blepharitis of left eye   . Ankle fracture    Stress fracture  . Anxiety   . Asthma    border line has inhaler  . Bunion   .  Cataracts, bilateral   . Chills (without fever)   . Corneal scar    left eye  . Degenerative disc disease   . Depression   . Diverticulosis of colon (without mention of hemorrhage) 2010   Colonoscopy  . Dry eyes    bilateral  . Enterocele   . External hemorrhoids 2000   Colonoscopy  . Family history of malignant neoplasm of gastrointestinal tract   . Female cystocele   . Fibromyalgia   . GERD (gastroesophageal reflux disease)   . GERD (gastroesophageal reflux disease)   . Hiatal hernia 2005,2010   EGD  . History of bronchitis   . History of measles   . History of mumps   . History of strep sore throat   . History of urinary tract infection   . Hyperlipemia   . Hypothyroidism   . IBS (irritable bowel syndrome)   . Imbalance   . Internal hemorrhoids without mention of complication [id number]   Colonoscopy   . Itching   . 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)   . Menopause   . Migraine   . OSA (obstructive sleep apnea) 05/14/2015  . Osteopenia 10/2013   T score -1.6 FRAX 10%/1.6%  . PAF (paroxysmal atrial  fibrillation) ([place])   . Pancreatitis   . PCO (posterior capsular opacification)    left  . Pneumonia    childhood illness  . PONV (postoperative nausea and vomiting)   . Pseudophakia, both eyes   . PVD (posterior vitreous detachment) right  . Rash    on back   . Retinal scar    left  . Rotator cuff disorder    pain, left shoulder  . Shingles   . Stricture and stenosis of esophagus 2005,2010   EGD   . Stroke ([place])   . Ulcerative colitis ([place])   . Varicose veins   . Vertigo   . Wears glasses     Past Surgical History:  Procedure Laterality Date  . ABDOMINAL HYSTERECTOMY    . ANAL FISSURE REPAIR    . BREAST EXCISIONAL BIOPSY Left   . BUNIONECTOMY  2014  . CATARACT EXTRACTION Bilateral 2013  . COLONOSCOPY     polyp removed  . ESOPHAGEAL DILATION    . EYE SURGERY Left   . FOOT SURGERY     left   . HEMORRHOID SURGERY    . MOUTH SURGERY  11/13/11   Cyst removed from  gum-benign   . NASAL SEPTUM SURGERY    . NASAL SEPTUM SURGERY  1970's  . REPLACEMENT TOTAL KNEE Right 2011  . TONSILLECTOMY    . TOTAL ABDOMINAL HYSTERECTOMY W/ BILATERAL SALPINGOOPHORECTOMY  1991   TAH BSO  . TOTAL HIP ARTHROPLASTY     right   . TOTAL KNEE ARTHROPLASTY     both  . TOTAL KNEE ARTHROPLASTY Left 06/23/2015   Procedure: LEFT TOTAL KNEE ARTHROPLASTY;  Surgeon: Gaynelle Arabian, MD;  Location: [facility];  Service: Orthopedics;  Laterality: Left;    Current Medications: Current Meds  Medication Sig  . acetaminophen (TYLENOL) 500 MG tablet Take 500 mg by mouth every 6 (six) hours as needed for moderate pain.   . Calcium Carbonate-Vitamin D (CALTRATE 600+D) 600-400 MG-UNIT tablet Take 1 tablet by mouth 2 (two) times daily.  Marland Kitchen diltiazem (CARDIZEM CD) 120 MG 24 hr capsule TAKE 1 CAPSULE (120 MG TOTAL) BY MOUTH AT BEDTIME.  Marland Kitchen ELIQUIS 5 MG TABS tablet TAKE 1 TABLET BY MOUTH TWICE A DAY  . EPINEPHrine 0.3 mg/0.3 mL IJ SOAJ injection Inject 0.3 mg into the muscle as needed (for allergic reaction). Reported on 05/28/2015   . fexofenadine (ALLEGRA) 180 MG tablet Take 180 mg by mouth daily.  . fluticasone (FLONASE) 50 MCG/ACT nasal spray Place 2 sprays into both nostrils every other day.   . levothyroxine (SYNTHROID) 100 MCG tablet Take 100 mcg by mouth daily before breakfast.  . montelukast (SINGULAIR) 10 MG tablet Take 1 tablet by mouth at bedtime.   . pantoprazole (PROTONIX) 40 MG tablet Take 1 tablet (40 mg total) by mouth daily.  . Polyethylene Glycol 3350 (MIRALAX PO) Dissolve 9 grams in at least 8 ounces water/juice and drink once daily as needed for constipation.  Marland Kitchen PROAIR HFA 108 (90 Base) MCG/ACT inhaler TAKE 2 PUFFS AS NEEDED EVERY 4-6 HOURS AS NEEDED COUGH/WHEEZE INHALATION 90  . Probiotic Product (ALIGN) 4 MG CAPS Take 1 capsule by mouth daily.  . RESTASIS 0.05 % ophthalmic emulsion Place 1 drop into both eyes 2 (two) times daily. Uses as directed  . rosuvastatin (CRESTOR) 10 MG  tablet TAKE ONE HALF TABLET ONCE A DAY AT BEDTIME ORALLY 90 DAY(S)     Allergies:   Azithromycin; Erythromycin; Almond oil; Amoxicillin-pot clavulanate; Atorvastatin; Codeine; Ketoconazole;  Ketoconazole; Latex; Lincomycin; Morphine; Morphine sulfate; Other; Shellfish allergy; Simvastatin; Sulfamethoxazole-trimethoprim; Clindamycin; Clindamycin/lincomycin; and Erythromycin base   Social History   Socioeconomic History  . Marital status: Married    Spouse name: Not on file  . Number of children: 1  . Years of education: bus.school  . Highest education level: Not on file  Occupational History  . Occupation: Retired    Fish farm manager: RETIRED  Social Needs  . Financial resource strain: Not on file  . Food insecurity:    Worry: Not on file    Inability: Not on file  . Transportation needs:    Medical: Not on file    Non-medical: Not on file  Tobacco Use  . Smoking status: Former Smoker    Packs/day: 0.25    Years: 15.00    Pack years: 3.75    Types: Cigarettes    Last attempt to quit: 06/01/1975    Years since quitting: 42.9  . Smokeless tobacco: Never Used  . Tobacco comment: Stopped smoking age 54  Substance and Sexual Activity  . Alcohol use: No    Alcohol/week: 0.0 standard drinks  . Drug use: No  . Sexual activity: Never    Birth control/protection: Surgical    Comment: HYST, intecourse age unknown, sexual partner less than 5  Lifestyle  . Physical activity:    Days per week: Not on file    Minutes per session: Not on file  . Stress: Not on file  Relationships  . Social connections:    Talks on phone: Not on file    Gets together: Not on file    Attends religious service: Not on file    Active member of club or organization: Not on file    Attends meetings of clubs or organizations: Not on file    Relationship status: Not on file  Other Topics Concern  . Not on file  Social History Narrative   Daily caffeine      Family History: The patient's family history  includes Alcohol abuse in her father; Allergies in her sister; Alzheimer's disease in her mother; Arthritis in her brother; Colon cancer in her paternal aunt; Congestive Heart Failure in her father; Dementia in her mother; Diabetes in her sister; Heart attack in her maternal grandfather, maternal grandmother, and paternal grandfather; Heart disease in her maternal grandmother; Hypertension in her father; Irritable bowel syndrome in her mother; Other in her sister and sister; Stroke in her mother. There is no history of Esophageal cancer, Rectal cancer, or Stomach cancer.  ROS:   Please see the history of present illness.    ROS  All other systems reviewed and negative.   EKGs/Labs/Other Studies Reviewed:    The following studies were reviewed today: PAP dowlnoad  EKG:  EKG is not ordered today.    Recent Labs: 07/21/2017: ALT 16; BUN 12; Creatinine, Ser 0.69; Hemoglobin 14.1; Platelets 236; Potassium 3.6; Sodium 138   Recent Lipid Panel    Component Value Date/Time   CHOL 124 07/23/2013 0632   TRIG 69 07/23/2013 0632   HDL 68 07/23/2013 0632   CHOLHDL 1.8 07/23/2013 0632   VLDL 14 07/23/2013 0632   LDLCALC 42 07/23/2013 0632    Physical Exam:    VS:  BP 140/90   Pulse 72   Ht 5' 7.5" (1.715 m)   Wt 185 lb (83.9 kg)   LMP  (LMP Unknown)   BMI 28.55 kg/m     Wt Readings from Last 3 Encounters:  04/19/18 185  lb (83.9 kg)  02/21/18 185 lb 9.6 oz (84.2 kg)  02/14/18 186 lb 3.2 oz (84.5 kg)     GEN:  Well nourished, well developed in no acute distress HEENT: Normal NECK: No JVD; No carotid bruits LYMPHATICS: No lymphadenopathy CARDIAC: RRR, no murmurs, rubs, gallops RESPIRATORY:  Clear to auscultation without rales, wheezing or rhonchi  ABDOMEN: Soft, non-tender, non-distended MUSCULOSKELETAL:  No edema; No deformity  SKIN: Warm and dry NEUROLOGIC:  Alert and oriented x 3 PSYCHIATRIC:  Normal affect   ASSESSMENT:    1. PAF (paroxysmal atrial fibrillation) ([place])     2. OSA (obstructive sleep apnea)   3. Syncope, unspecified syncope type    PLAN:    In order of problems listed above:  1.  Paroxysmal atrial fibrillation - She is maintaining NSR on exam.  She will continue on Eliquis 5mg  BID and Cardizem CD 120mg  daily.  Her creatinine was 0.7 on 10/04/2017.  I will repeat a BMET today.    2.  OSA - the patient is tolerating PAP therapy well without any problems. The PAP download was reviewed today and showed an AHI of 12.6/hr on auto PAP with 67% compliance in using more than 4 hours nightly.  The patient has been using and benefiting from PAP use and will continue to benefit from therapy. Her download shows a large mask leak but she is still struggling with feeling like the pressure is too much.  Her AHI is too high at 12.6/h.  I do not think she is known to be tolerant of CPAP and recommended that we send her back to the lab for a BiPAP titration.  We did talk about the inspire device in case she does not tolerate BiPAP.  I will see her back after she is started on BiPAP therapy to see how she is doing.  3.  Syncope - Felt due to dehydration with no reoccurence.   Medication Adjustments/Labs and Tests Ordered: Current medicines are reviewed at length with the patient today.  Concerns regarding medicines are outlined above.  No orders of the defined types were placed in this encounter.  No orders of the defined types were placed in this encounter.   Signed, Fransico Him, MD  04/19/2018 1:21 PM    [facility]

## 2018-04-19 NOTE — Addendum Note (Signed)
Addended by: Sarina Ill on: 04/19/2018 01:50 PM   Modules accepted: Orders

## 2018-04-20 LAB — BASIC METABOLIC PANEL
BUN/Creatinine Ratio: 19 (ref 12–28)
BUN: 17 mg/dL (ref 8–27)
CO2: 21 mmol/L (ref 20–29)
Calcium: 9.7 mg/dL (ref 8.7–10.3)
Chloride: 104 mmol/L (ref 96–106)
Creatinine, Ser: 0.9 mg/dL (ref 0.57–1.00)
GFR calc Af Amer: 72 mL/min/{1.73_m2} (ref >59)
GFR calc non Af Amer: 62 mL/min/{1.73_m2} (ref >59)
Glucose: 91 mg/dL (ref 65–99)
Potassium: 4.4 mmol/L (ref 3.5–5.2)
Sodium: 144 mmol/L (ref 134–144)

## 2018-04-21 ENCOUNTER — Telehealth: Payer: Self-pay | Admitting: *Deleted

## 2018-04-21 NOTE — Telephone Encounter (Signed)
-----   Message from Sarina Ill, RN sent at 04/19/2018  3:34 PM EST ----- Regarding: Sleep Dr. Radford Pax ordered a BiPAP titration. Order placed please schedule and send. Thanks, [employer]

## 2018-04-24 ENCOUNTER — Telehealth: Payer: Self-pay | Admitting: *Deleted

## 2018-04-24 NOTE — Telephone Encounter (Signed)
Staff message sent to Kimberly [name] per [facility] web portal no PA is required. Ok to schedule BIPAP titration. Decision # [id number].

## 2018-04-24 NOTE — Telephone Encounter (Signed)
-----   Message from Freada Bergeron, Jerome sent at 04/21/2018  6:10 PM EST ----- Regarding: pre cert   ----- Message ----- From: Sarina Ill, RN Sent: 04/19/2018   3:34 PM EST To: Freada Bergeron, CMA Subject: Sleep                                          Dr. Radford Pax ordered a BiPAP titration. Order placed please schedule and send. Thanks, [employer]

## 2018-04-26 ENCOUNTER — Encounter: Payer: Self-pay | Admitting: *Deleted

## 2018-04-26 NOTE — Telephone Encounter (Signed)
Patient is scheduled for BiPAP Titration on 05/11/18. Patient understands her titration study will be done at [facility] sleep lab. Patient understands she will receive a letter in a week or so detailing appointment, date, time, and location. Patient understands to call if she does not receive the letter  in a timely manner. Patient agrees with treatment and thanked me for call.

## 2018-05-01 NOTE — Telephone Encounter (Signed)
  Lauralee Evener, CMA  Freada Bergeron, CMA        Per [facility] web portal no PA is required. Decision # [id number]. Ok to schedule BIPAP titration.

## 2018-05-01 NOTE — Telephone Encounter (Deleted)
Patient is scheduled for lab study on 05/11/18. Patient understands her Bipap study will be done at [facility] sleep lab. Patient understands she will receive a sleep packet in a week or so. Patient understands to call if she does not receive the sleep packet in a timely manner. Patient agrees with treatment and thanked me for call.

## 2018-05-05 ENCOUNTER — Telehealth: Payer: Self-pay

## 2018-05-05 NOTE — Telephone Encounter (Signed)
Spoke to Kimberly [name] who states she has a lot of doctors appointments between her and her husband over the next few months. States she would like to try and enroll into a PREP class in March/April 2020 when the 2nd round of classes for the new year start.  I will call her back closer to then.

## 2018-05-07 ENCOUNTER — Other Ambulatory Visit (HOSPITAL_COMMUNITY): Payer: Self-pay | Admitting: Nurse Practitioner

## 2018-05-08 NOTE — Telephone Encounter (Signed)
Patient called to make our office aware that her eye doctor says he thinks her cpap is contributing to her eye dryness.

## 2018-05-11 ENCOUNTER — Ambulatory Visit (HOSPITAL_BASED_OUTPATIENT_CLINIC_OR_DEPARTMENT_OTHER): Payer: Medicare Other | Attending: Cardiology | Admitting: Cardiology

## 2018-05-11 VITALS — Ht 67.0 in | Wt 180.0 lb

## 2018-05-11 DIAGNOSIS — G4733 Obstructive sleep apnea (adult) (pediatric): Secondary | ICD-10-CM | POA: Diagnosis not present

## 2018-05-16 ENCOUNTER — Encounter: Payer: Medicare Other | Admitting: Gynecology

## 2018-05-19 NOTE — Procedures (Signed)
   Patient Name: Kimberly [name], Kimberly [name] Date: 05/11/2018 Gender: Female D.O.B: 10/21/1940 Age (years): 27 Referring Provider: Fransico Him MD, ABSM Height (inches): 67 Interpreting Physician: Fransico Him MD, ABSM Weight (lbs): 183 RPSGT: Jorge Ny BMI: 29 MRN: [id number] Neck Size: 13.00  CLINICAL INFORMATION The patient is referred for a BiPAP titration to treat sleep apnea.  SLEEP STUDY TECHNIQUE As per the AASM Manual for the Scoring of Sleep and Associated Events v2.3 (April 2016) with a hypopnea requiring 4% desaturations.  The channels recorded and monitored were frontal, central and occipital EEG, electrooculogram (EOG), submentalis EMG (chin), nasal and oral airflow, thoracic and abdominal wall motion, anterior tibialis EMG, snore microphone, electrocardiogram, and pulse oximetry. Bilevel positive airway pressure (BPAP) was initiated at the beginning of the study and titrated to treat sleep-disordered breathing.  MEDICATIONS Medications self-administered by patient taken the night of the study : ELIQUIS, CARDIZEM CD, CRESTOR, SYSTANE GEL  RESPIRATORY PARAMETERS Optimal IPAP Pressure (cm): N/A AHI at Optimal Pressure (/hr) N/A Optimal EPAP Pressure (cm):N/A  Overall Minimal O2 (%):86.0  Minimal O2 at Optimal Pressure (%): 86.0  SLEEP ARCHITECTURE Start Time:11:31:36 PM  Stop Time:5:40:01 AM  Total Time (min):368.4  Total Sleep Time (min):299.5 Sleep Latency (min):5.7  Sleep Efficiency (%):81.3%  REM Latency (min):104.0  WASO (min): 63.2 Stage N1 (%): 2.7%  Stage N2 (%): 66.6%  Stage N3 (%): 18.7%  Stage R (%):12 Supine (%):0.00  Arousal Index (/hr):9.8   CARDIAC DATA The 2 lead EKG demonstrated sinus rhythm. The mean heart rate was 61.7 beats per minute. Other EKG findings include: None  LEG MOVEMENT DATA The total Periodic Limb Movements of Sleep (PLMS) were 0. The PLMS index was 0.0. A PLMS index of <15 is considered normal in  adults.  IMPRESSIONS - An optimal PAP pressure could not be selected for this patient based on the available study data. - Central sleep apnea was not noted during this titration (CAI = 0.8/h). - Moderete oxygen desaturations were observed during this titration (min O2 = 86.0%). - The patient snored with moderate snoring volume. - No cardiac abnormalities were observed during this study. - Clinically significant periodic limb movements were not noted during this study. Arousals associated with PLMs were rare.  DIAGNOSIS - Obstructive Sleep Apnea (327.23 [G47.33 ICD-10])  RECOMMENDATIONS - Recommend a trial of Auto-BiPAP 6 - 18 cm H2O. - Avoid alcohol, sedatives and other CNS depressants that may worsen sleep apnea and disrupt normal sleep architecture. - Sleep hygiene should be reviewed to assess factors that may improve sleep quality. - Weight management and regular exercise should be initiated or continued. - Return to Sleep Center for re-evaluation after 10 weeks of therapy  [Electronically signed] 05/19/2018 08:40 PM  Fransico Him MD, ABSM Diplomate, American Board of Sleep Medicine

## 2018-05-23 ENCOUNTER — Telehealth: Payer: Self-pay | Admitting: *Deleted

## 2018-05-23 NOTE — Telephone Encounter (Signed)
-----   Message from Sueanne Margarita, MD sent at 05/19/2018  8:48 PM EST ----- Please let patient know that they had a difficult time getting a successful BiPAP titration and let DME know that orders are in Epic for auto BIPAP.  Please set up 10 week OV with me.

## 2018-05-23 NOTE — Telephone Encounter (Signed)
Informed patient of titration results and verbalized understanding was indicated. Patient understands her titration study showed they had a difficult time getting a successful BiPAP titration and let DME know that orders are in Epic for auto BIPAP. Please set up 10 week OV with me.  Upon patient request DME selection is [facility] Patient understands she will be contacted by [facility] to set up her cpap. Patient understands to call if [facility] does not contact her with new setup in a timely manner. Patient understands they will be called once confirmation has been received from [facility] that they have received their new machine to schedule 10 week follow up appointment.  [facility] notified of new cpap order  Please add to airview Patient was grateful for the call and thanked me.

## 2018-06-02 ENCOUNTER — Ambulatory Visit: Payer: Medicare Other | Admitting: Gynecology

## 2018-06-02 ENCOUNTER — Encounter: Payer: Self-pay | Admitting: Gynecology

## 2018-06-02 ENCOUNTER — Telehealth: Payer: Self-pay | Admitting: *Deleted

## 2018-06-02 VITALS — BP 124/78 | Ht 66.5 in | Wt 185.0 lb

## 2018-06-02 DIAGNOSIS — N8189 Other female genital prolapse: Secondary | ICD-10-CM | POA: Diagnosis not present

## 2018-06-02 DIAGNOSIS — Z01419 Encounter for gynecological examination (general) (routine) without abnormal findings: Secondary | ICD-10-CM

## 2018-06-02 DIAGNOSIS — N952 Postmenopausal atrophic vaginitis: Secondary | ICD-10-CM | POA: Diagnosis not present

## 2018-06-02 MED ORDER — CIPROFLOXACIN HCL 250 MG PO TABS: 250.0000 mg | ORAL_TABLET | Freq: Two times a day (BID) | ORAL | 0 refills | Status: DC

## 2018-06-02 NOTE — Telephone Encounter (Signed)
Agree, let me know if U/A abnormal, will treat with MacroBID 1 tab PO BID x 5 days as she has no allergy to it... many allergies.

## 2018-06-02 NOTE — Telephone Encounter (Signed)
Patient is nervous to take macrobid, asked if she can take low dose cipro states she has taken this before. Please advise

## 2018-06-02 NOTE — Patient Instructions (Signed)
Follow-up in 1 year, sooner as needed. 

## 2018-06-02 NOTE — Progress Notes (Signed)
    Kimberly [name] [birth year] [id number]        78 y.o.  G0P0 for breast and pelvic exam.  History of cystocele/enterocele.  Followed by urology.  Not overly bothered by these symptoms.  Past medical history,surgical history, problem list, medications, allergies, family history and social history were all reviewed and documented as reviewed in the EPIC chart.  ROS:  Performed with pertinent positives and negatives included in the history, assessment and plan.   Additional significant findings : None   Exam: Caryn Bee assistant Vitals:   06/02/18 0842  BP: 124/78  Weight: 185 lb (83.9 kg)  Height: 5' 6.5" (1.689 m)   Body mass index is 29.41 kg/m.  General appearance:  Normal affect, orientation and appearance. Skin: Grossly normal HEENT: Without gross lesions.  No cervical or supraclavicular adenopathy. Thyroid normal.  Lungs:  Clear without wheezing, rales or rhonchi Cardiac: RR, without RMG Abdominal:  Soft, nontender, without masses, guarding, rebound, organomegaly or hernia Breasts:  Examined lying and sitting without masses, retractions, discharge or axillary adenopathy. Pelvic:  Ext, BUS, Vagina: With atrophic changes.  High cystocele/enterocele within 2 fingerbreadths of the introitus.  No significant rectocele.  Cuff well supported  Adnexa: Without masses or tenderness    Anus and perineum: Normal   Rectovaginal: Normal sphincter tone without palpated masses or tenderness.    Assessment/Plan:  78 y.o. G0P0 female for rest and pelvic exam.  Status post TAH/BSO in the past.   1. Postmenopausal.  No significant menopausal symptoms. 2. Cystocele/enterocele.  Is followed by urology.  Does not feel that her symptoms are worsening.  Does feel some pelvic pressure after prolonged standing but otherwise doing well.  We have discussed the options to include expectant, pessary and surgery.  She has seen physical therapy at [facility] urology for pelvic strengthening.  Is not  interested in doing anything further at this time. 3. Osteopenia/porosis.  Followed by Dr. Felipa Eth.  We will continue to follow-up with him in reference to bone health. 4. Colonoscopy 2013.  Repeat at their recommended interval. 5. Mammography 12/2017.  Continue with annual mammography when due.  Breast exam normal today. 6. Pap smear 2011.  No Pap smear done today.  No history of significant abnormal Pap smears.  We both agree to stop screening per current screening guidelines based on age and hysterectomy history. 7. Health maintenance.  No routine lab work done as patient does this elsewhere.  Follow-up 1 year, sooner as needed.   Anastasio Auerbach MD, 9:09 AM 06/02/2018

## 2018-06-02 NOTE — Telephone Encounter (Signed)
Agree with Cipro 250 BID x 5 days.

## 2018-06-02 NOTE — Telephone Encounter (Addendum)
(  Dr.Fontaine patient) Patient was seen today for annual exam, left a urine specimen, called stating urinary frequency and burning with urination has worsened, I checked with [name] in lab to process urine.

## 2018-06-02 NOTE — Telephone Encounter (Signed)
Rx sent for Cipro, patient aware.

## 2018-06-04 LAB — URINALYSIS, COMPLETE W/RFL CULTURE
Bacteria, UA: NONE SEEN /HPF
Bilirubin Urine: NEGATIVE
Glucose, UA: NEGATIVE
Hyaline Cast: NONE SEEN /LPF
Ketones, ur: NEGATIVE
Nitrites, Initial: NEGATIVE
Protein, ur: NEGATIVE
Specific Gravity, Urine: 1.004 (ref 1.001–1.03)
pH: 6.5 (ref 5.0–8.0)

## 2018-06-04 LAB — URINE CULTURE
MICRO NUMBER:: 13491
SPECIMEN QUALITY:: ADEQUATE

## 2018-06-04 LAB — CULTURE INDICATED

## 2018-06-08 NOTE — Telephone Encounter (Addendum)
Patient has a 10 week follow up appointment scheduled for 07/18/18. Patient understands she needs to keep this appointment for insurance compliance. Patient was grateful for the call and thanked me.

## 2018-06-30 ENCOUNTER — Encounter: Payer: Self-pay | Admitting: Gynecology

## 2018-07-10 ENCOUNTER — Telehealth: Payer: Self-pay | Admitting: *Deleted

## 2018-07-10 NOTE — Telephone Encounter (Signed)
Patient saw urology Dr. Gilford Rile he prescribed premarin vaginal cream 0.625 mg very small amount 2-3 times a week, not a full applicator. Patient has noticed increased bilateral nipple tenderness since starting medication. Patient did mention prior to starting premarin she had some breast tenderness, but none thing to bothersome. Patient said she checked with Dr. Gilford Rile and he told her that she is using such a small amount that she is not absorbing much estrogen.   patient would like to know you thoughts about this. I did explain she could schedule visit for breast exam. Please advise

## 2018-07-10 NOTE — Telephone Encounter (Signed)
Absorption from vaginal estrogen is very low.  Sometimes when patients do start estrogen it can cause some breast tenderness.  As it is bilateral then I suspect this is physiologic and not anything of concern.  I would continue with the vaginal estrogen and if it is due to this the tenderness should get better on its own.  Certainly if she has any concerns office visit with exam always an option.

## 2018-07-11 NOTE — Telephone Encounter (Signed)
Patient informed. 

## 2018-07-13 ENCOUNTER — Ambulatory Visit: Payer: Medicare Other | Admitting: Podiatry

## 2018-07-13 ENCOUNTER — Ambulatory Visit (INDEPENDENT_AMBULATORY_CARE_PROVIDER_SITE_OTHER): Payer: Medicare Other

## 2018-07-13 ENCOUNTER — Encounter: Payer: Self-pay | Admitting: Podiatry

## 2018-07-13 VITALS — BP 128/68 | HR 62 | Resp 14

## 2018-07-13 DIAGNOSIS — M2042 Other hammer toe(s) (acquired), left foot: Secondary | ICD-10-CM | POA: Diagnosis not present

## 2018-07-13 DIAGNOSIS — M21619 Bunion of unspecified foot: Secondary | ICD-10-CM | POA: Diagnosis not present

## 2018-07-13 DIAGNOSIS — Q828 Other specified congenital malformations of skin: Secondary | ICD-10-CM

## 2018-07-13 DIAGNOSIS — B351 Tinea unguium: Secondary | ICD-10-CM | POA: Diagnosis not present

## 2018-07-13 DIAGNOSIS — J301 Allergic rhinitis due to pollen: Secondary | ICD-10-CM | POA: Insufficient documentation

## 2018-07-13 DIAGNOSIS — M2041 Other hammer toe(s) (acquired), right foot: Secondary | ICD-10-CM

## 2018-07-13 DIAGNOSIS — E663 Overweight: Secondary | ICD-10-CM | POA: Insufficient documentation

## 2018-07-13 DIAGNOSIS — Z7901 Long term (current) use of anticoagulants: Secondary | ICD-10-CM

## 2018-07-13 DIAGNOSIS — E669 Obesity, unspecified: Secondary | ICD-10-CM | POA: Insufficient documentation

## 2018-07-13 NOTE — Progress Notes (Signed)
Subjective:    Patient ID: Kimberly [name], female    DOB: [birth year], 78 y.o.   MRN: [id number]  HPI 78 year old female presents the office today for concerns of hammertoes both of her feet as well as a bunion on her right foot.  She states that she previously did see Dr. Janus Molder and he would trim the calluses.  She gets discomfort on the calluses the ball of her left foot.  She previously had bunion surgery on her left big toe about 5 years ago. .   Review of Systems  All other systems reviewed and are negative.  Past Medical History:  Diagnosis Date  . Allergy   . Anal polyp 1998   Flex Sig   . Anemia   . Angular blepharitis of left eye   . Ankle fracture    Stress fracture  . Anxiety   . Asthma    border line has inhaler  . Bunion   . Cataracts, bilateral   . Chills (without fever)   . Corneal scar    left eye  . CPAP (continuous positive airway pressure) dependence   . Degenerative disc disease   . Depression   . Diverticulosis of colon (without mention of hemorrhage) 2010   Colonoscopy  . Dry eyes    bilateral  . Enterocele   . External hemorrhoids 2000   Colonoscopy  . Family history of malignant neoplasm of gastrointestinal tract   . Female cystocele   . Fibromyalgia   . GERD (gastroesophageal reflux disease)   . GERD (gastroesophageal reflux disease)   . Hiatal hernia 2005,2010   EGD  . History of bronchitis   . History of measles   . History of mumps   . History of strep sore throat   . History of urinary tract infection   . Hyperlipemia   . Hypothyroidism   . IBS (irritable bowel syndrome)   . Imbalance   . Internal hemorrhoids without mention of complication 7106,2694   Colonoscopy   . Itching   . 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 Menopause   . Migraine   . OSA (obstructive sleep apnea) 05/14/2015  . Osteopenia 10/2013   T score -1.6 FRAX 10%/1.6%  . PAF (paroxysmal atrial fibrillation) ([place])   . Pancreatitis   . PCO (posterior capsular  opacification)    left  . Pneumonia    childhood illness  . PONV (postoperative nausea and vomiting)   . Pseudophakia, both eyes   . PVD (posterior vitreous detachment) right  . Rash    on back   . Retinal scar    left  . Rotator cuff disorder    pain, left shoulder  . Shingles   . Stricture and stenosis of esophagus 2005,2010   EGD   . Stroke ([place])   . Ulcerative colitis ([place])   . Varicose veins   . Vertigo   . Wears glasses     Past Surgical History:  Procedure Laterality Date  . ABDOMINAL HYSTERECTOMY    . ANAL FISSURE REPAIR    . BREAST EXCISIONAL BIOPSY Left   . BUNIONECTOMY  2014  . CATARACT EXTRACTION Bilateral 2013  . COLONOSCOPY     polyp removed  . ESOPHAGEAL DILATION    . EYE SURGERY Left   . FOOT SURGERY     left   . HEMORRHOID SURGERY    . MOUTH SURGERY  11/13/11   Cyst removed from gum-benign   . NASAL SEPTUM SURGERY    .  NASAL SEPTUM SURGERY  1970's  . REPLACEMENT TOTAL KNEE Right 2011  . TONSILLECTOMY    . TOTAL ABDOMINAL HYSTERECTOMY W/ BILATERAL SALPINGOOPHORECTOMY  1991   TAH BSO  . TOTAL HIP ARTHROPLASTY     right   . TOTAL KNEE ARTHROPLASTY     both  . TOTAL KNEE ARTHROPLASTY Left 06/23/2015   Procedure: LEFT TOTAL KNEE ARTHROPLASTY;  Surgeon: Gaynelle Arabian, MD;  Location: [facility];  Service: Orthopedics;  Laterality: Left;     Current Outpatient Medications:  .  NON FORMULARY, [facility]  ANTIFUNGAL (NAIL)- #1, Disp: , Rfl:  .  acetaminophen (TYLENOL) 500 MG tablet, Take 500 mg by mouth every 6 (six) hours as needed for moderate pain. , Disp: , Rfl:  .  Calcium Carbonate-Vitamin D (CALTRATE 600+D) 600-400 MG-UNIT tablet, Take 1 tablet by mouth 2 (two) times daily., Disp: , Rfl:  .  diltiazem (CARDIZEM CD) 120 MG 24 hr capsule, TAKE 1 CAPSULE (120 MG TOTAL) BY MOUTH AT BEDTIME., Disp: 30 capsule, Rfl: 6 .  ELIQUIS 5 MG TABS tablet, TAKE 1 TABLET BY MOUTH TWICE A DAY, Disp: 180 tablet, Rfl: 2 .  EPINEPHrine 0.3 mg/0.3 mL IJ SOAJ  injection, Inject 0.3 mg into the muscle as needed (for allergic reaction). Reported on 05/28/2015 , Disp: , Rfl:  .  fluticasone (FLONASE) 50 MCG/ACT nasal spray, Place 2 sprays into both nostrils every other day. , Disp: , Rfl:  .  levothyroxine (SYNTHROID) 100 MCG tablet, Take 100 mcg by mouth daily before breakfast., Disp: , Rfl:  .  pantoprazole (PROTONIX) 40 MG tablet, Take 1 tablet (40 mg total) by mouth daily., Disp: 30 tablet, Rfl: 10 .  Polyethylene Glycol 3350 (MIRALAX PO), Dissolve 9 grams in at least 8 ounces water/juice and drink once daily as needed for constipation., Disp: , Rfl:  .  PROAIR HFA 108 (90 Base) MCG/ACT inhaler, TAKE 2 PUFFS AS NEEDED EVERY 4-6 HOURS AS NEEDED COUGH/WHEEZE INHALATION 90, Disp: , Rfl: 0 .  Probiotic Product (ALIGN) 4 MG CAPS, Take 1 capsule by mouth daily., Disp: , Rfl:  .  RESTASIS 0.05 % ophthalmic emulsion, Place 1 drop into both eyes 2 (two) times daily. Uses as directed, Disp: , Rfl:  .  rosuvastatin (CRESTOR) 10 MG tablet, TAKE ONE HALF TABLET ONCE A DAY AT BEDTIME ORALLY 90 DAY(S), Disp: , Rfl: 4  Allergies  Allergen Reactions  . Azithromycin Other (See Comments)    Pancreatitis  . Erythromycin Hives    Reaction 1 time, when she was younger. Reaction 1 time, when she was younger.   Toniann Ket Other (See Comments)    Almonds: per allergy testing.  . Amoxicillin-Pot Clavulanate Nausea And Vomiting and Nausea Only    Has patient had a PCN reaction causing immediate rash, facial/tongue/throat swelling, SOB or lightheadedness with hypotension: No Has patient had a PCN reaction causing severe rash involving mucus membranes or skin necrosis: No Has patient had a PCN reaction that required hospitalization No Has patient had a PCN reaction occurring within the last 10 years: No If all of the above answers are "NO", then may proceed with Cephalosporin use.   . Atorvastatin Other (See Comments)    Muscle weakness  Muscle weakness   . Codeine  Nausea Only  . Ketoconazole Nausea And Vomiting  . Ketoconazole Nausea Only and Nausea And Vomiting  . Latex Other (See Comments)    Other  . Lincomycin     Stomach upset   .  Morphine Nausea Only  . Morphine Sulfate Nausea Only and Other (See Comments)      Unknown  . Other Other (See Comments)    Shellfish mix: per allergy testing.  . Shellfish Allergy Other (See Comments)    Shellfish mix: per allergy testing.  . Simvastatin Other (See Comments)    Muscle pain  . Sulfamethoxazole-Trimethoprim Nausea Only    Upset stomach   . Clindamycin Rash  . Clindamycin/Lincomycin Nausea And Vomiting and Rash  . Erythromycin Base Rash         Objective:   Physical Exam  General: AAO x3, NAD  Dermatological: Hyperkeratotic lesion submetatarsal area in the left foot.  There is no underlying ulceration, drainage or any signs of infection.  No open lesions identified otherwise.  Vascular: Dorsalis Pedis artery and Posterior Tibial artery pedal pulses are 2/4 bilateral with immedate capillary fill time.  There is no pain with calf compression, swelling, warmth, erythema.   Neruologic: Grossly intact via light touch bilateral.  Protective threshold with Semmes Wienstein monofilament intact to all pedal sites bilateral.   Musculoskeletal: Bony deformities also hammertoe deformities are present on the right side worse than left.  There is no area of tenderness except for the callus sites today.  There is mild irritation of the dorsal PIPJ of the right second toe is been irritated inside shoes but there is no skin breakdown.  Muscular strength 5/5 in all groups tested bilateral.      Assessment & Plan:  78 year old female with symptomatic hyperkeratotic lesions; hammertoe/bunion deformity -Treatment options discussed including all alternatives, risks, and complications -Etiology of symptoms were discussed -X-rays were obtained and reviewed with the patient.  Hardware intact and prior  surgery in the left bunion as well as second metatarsal.  Bony deformities present bilaterally with hammertoe deformity.  No evidence of acute fracture. -In regards to the bunion, hammertoe deformities we discussed both conservative as well as surgical treatment options.  Would continue with conservative care for now.  Discussed shoe modifications as well as different offloading pads. -Hyperkeratotic lesion sharp debrided without any complications or bleeding.  Trula Slade DPM

## 2018-07-17 NOTE — Progress Notes (Signed)
Cardiology Office Note:    Date:  07/18/2018   ID:  Kimberly [name], DOB 09-07-1940, MRN [id number]  PCP:  Lajean Manes, MD  Cardiologist:  Fransico Him, MD    Referring MD: Lajean Manes, MD   Chief Complaint  Patient presents with  . Sleep Apnea  . Atrial Fibrillation    History of Present Illness:    Kimberly [name] is a 78 y.o. female with a hx of PAF on metoprolol and Eliquis for chads vas score of 5. Metoprolol decreased by [facility] because of bradycardia. She also has a history of chest pain with normal nuclear stress test and  syncope felt secondary to dehydration.  She has obstructive sleep apnea on CPAP and was having problems with her device and finding a mask that she could tolerate.  She was complaining that the pressure was too high on her auto device and had not been using it.  Her device was changed to 5 to 10 cm H2O on auto.  Her EPR was also set at 3. At last OV, despite several changes on her device, she was still not tolerating the pressure.  She was sent back to the Sleep lab for BiPAP titration and is now back for followup.    She is here today for followup and is doing well.  She denies any chest pain or pressure, SOB, DOE, PND, orthopnea, LE edema, dizziness, palpitations or syncope. She is compliant with her meds and is tolerating meds with no SE.  She is doing well with her new BiPAP device and thinks that with the new Respironics dreamwear under the nose mask she is doing better and was actually able to get to 100% compliance in using more than 4 hours nightly.  She tolerates the mask and feels the pressure is adequate but the mask is still leaking some.  She thinks that she is in between a small and medium mask.  She denies any significant mouth or nasal dryness or nasal congestion but still has some issues with the air blowing into her eyes.  She does not think that he snores.     Past Medical History:  Diagnosis Date  . Allergy   . Anal polyp 1998   Flex Sig   . Anemia   . Angular blepharitis of left eye   . Ankle fracture    Stress fracture  . Anxiety   . Asthma    border line has inhaler  . Bunion   . Cataracts, bilateral   . Chills (without fever)   . Corneal scar    left eye  . CPAP (continuous positive airway pressure) dependence   . Degenerative disc disease   . Depression   . Diverticulosis of colon (without mention of hemorrhage) 2010   Colonoscopy  . Dry eyes    bilateral  . Enterocele   . External hemorrhoids 2000   Colonoscopy  . Family history of malignant neoplasm of gastrointestinal tract   . Female cystocele   . Fibromyalgia   . GERD (gastroesophageal reflux disease)   . GERD (gastroesophageal reflux disease)   . Hiatal hernia 2005,2010   EGD  . History of bronchitis   . History of measles   . History of mumps   . History of strep sore throat   . History of urinary tract infection   . Hyperlipemia   . Hypothyroidism   . IBS (irritable bowel syndrome)   . Imbalance   . Internal hemorrhoids without  mention of complication [id number]   Colonoscopy   . Itching   . 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[place])   . Menopause   . Migraine   . OSA (obstructive sleep apnea) 05/14/2015  . Osteopenia 10/2013   T score -1.6 FRAX 10%/1.6%  . PAF (paroxysmal atrial fibrillation) ([place])   . Pancreatitis   . PCO (posterior capsular opacification)    left  . Pneumonia    childhood illness  . PONV (postoperative nausea and vomiting)   . Pseudophakia, both eyes   . PVD (posterior vitreous detachment) right  . Rash    on back   . Retinal scar    left  . Rotator cuff disorder    pain, left shoulder  . Shingles   . Stricture and stenosis of esophagus 2005,2010   EGD   . Stroke ([place])   . Ulcerative colitis ([place])   . Varicose veins   . Vertigo   . Wears glasses     Past Surgical History:  Procedure Laterality Date  . ABDOMINAL HYSTERECTOMY    . ANAL FISSURE REPAIR    . BREAST EXCISIONAL BIOPSY Left   . BUNIONECTOMY   2014  . CATARACT EXTRACTION Bilateral 2013  . COLONOSCOPY     polyp removed  . ESOPHAGEAL DILATION    . EYE SURGERY Left   . FOOT SURGERY     left   . HEMORRHOID SURGERY    . MOUTH SURGERY  11/13/11   Cyst removed from gum-benign   . NASAL SEPTUM SURGERY    . NASAL SEPTUM SURGERY  1970's  . REPLACEMENT TOTAL KNEE Right 2011  . TONSILLECTOMY    . TOTAL ABDOMINAL HYSTERECTOMY W/ BILATERAL SALPINGOOPHORECTOMY  1991   TAH BSO  . TOTAL HIP ARTHROPLASTY     right   . TOTAL KNEE ARTHROPLASTY     both  . TOTAL KNEE ARTHROPLASTY Left 06/23/2015   Procedure: LEFT TOTAL KNEE ARTHROPLASTY;  Surgeon: Gaynelle Arabian, MD;  Location: [facility];  Service: Orthopedics;  Laterality: Left;    Current Medications: Current Meds  Medication Sig  . acetaminophen (TYLENOL) 500 MG tablet Take 500 mg by mouth every 6 (six) hours as needed for moderate pain.   . Calcium Carbonate-Vitamin D (CALTRATE 600+D) 600-400 MG-UNIT tablet Take 1 tablet by mouth 2 (two) times daily.  Marland Kitchen diltiazem (CARDIZEM CD) 120 MG 24 hr capsule TAKE 1 CAPSULE (120 MG TOTAL) BY MOUTH AT BEDTIME.  Marland Kitchen ELIQUIS 5 MG TABS tablet TAKE 1 TABLET BY MOUTH TWICE A DAY  . EPINEPHrine 0.3 mg/0.3 mL IJ SOAJ injection Inject 0.3 mg into the muscle as needed (for allergic reaction). Reported on 05/28/2015   . fluticasone (FLONASE) 50 MCG/ACT nasal spray Place 2 sprays into both nostrils every other day.   . levothyroxine (SYNTHROID) 100 MCG tablet Take 100 mcg by mouth daily before breakfast.  . NON FORMULARY [facility]  ANTIFUNGAL (NAIL)- #1  . pantoprazole (PROTONIX) 40 MG tablet Take 1 tablet (40 mg total) by mouth daily.  . Polyethylene Glycol 3350 (MIRALAX PO) Dissolve 9 grams in at least 8 ounces water/juice and drink once daily as needed for constipation.  Marland Kitchen PROAIR HFA 108 (90 Base) MCG/ACT inhaler TAKE 2 PUFFS AS NEEDED EVERY 4-6 HOURS AS NEEDED COUGH/WHEEZE INHALATION 90  . Probiotic Product (ALIGN) 4 MG CAPS Take 1 capsule by mouth  daily.  . RESTASIS 0.05 % ophthalmic emulsion Place 1 drop into both eyes 2 (two) times daily. Uses as directed  . rosuvastatin (CRESTOR)  10 MG tablet TAKE ONE HALF TABLET ONCE A DAY AT BEDTIME ORALLY 90 DAY(S)     Allergies:   Azithromycin; Erythromycin; Almond oil; Amoxicillin-pot clavulanate; Atorvastatin; Codeine; Ketoconazole; Ketoconazole; Latex; Lincomycin; Morphine; Morphine sulfate; Other; Shellfish allergy; Simvastatin; Sulfamethoxazole-trimethoprim; Clindamycin; Clindamycin/lincomycin; and Erythromycin base   Social History   Socioeconomic History  . Marital status: Married    Spouse name: Not on file  . Number of children: 1  . Years of education: bus.school  . Highest education level: Not on file  Occupational History  . Occupation: Retired    Fish farm manager: RETIRED  Social Needs  . Financial resource strain: Not on file  . Food insecurity:    Worry: Not on file    Inability: Not on file  . Transportation needs:    Medical: Not on file    Non-medical: Not on file  Tobacco Use  . Smoking status: Former Smoker    Packs/day: 0.25    Years: 15.00    Pack years: 3.75    Types: Cigarettes    Last attempt to quit: 06/01/1975    Years since quitting: 43.1  . Smokeless tobacco: Never Used  . Tobacco comment: Stopped smoking age 69  Substance and Sexual Activity  . Alcohol use: No    Alcohol/week: 0.0 standard drinks  . Drug use: No  . Sexual activity: Never    Birth control/protection: Surgical    Comment: HYST, intecourse age unknown, sexual partner less than 5  Lifestyle  . Physical activity:    Days per week: Not on file    Minutes per session: Not on file  . Stress: Not on file  Relationships  . Social connections:    Talks on phone: Not on file    Gets together: Not on file    Attends religious service: Not on file    Active member of club or organization: Not on file    Attends meetings of clubs or organizations: Not on file    Relationship status: Not on  file  Other Topics Concern  . Not on file  Social History Narrative   Daily caffeine      Family History: The patient's family history includes Alcohol abuse in her father; Allergies in her sister; Alzheimer's disease in her mother; Arthritis in her brother; Breast cancer (age of onset: 80) in her sister; Colon cancer in her paternal aunt; Congestive Heart Failure in her father; Dementia in her mother; Diabetes in her sister; Heart attack in her maternal grandfather, maternal grandmother, and paternal grandfather; Heart disease in her maternal grandmother; Hypertension in her father; Irritable bowel syndrome in her mother; Other in her sister and sister; Stroke in her mother. There is no history of Esophageal cancer, Rectal cancer, or Stomach cancer.  ROS:   Please see the history of present illness.    ROS  All other systems reviewed and negative.   EKGs/Labs/Other Studies Reviewed:    The following studies were reviewed today: PAP download  EKG:  EKG is not ordered today.    Recent Labs: 07/21/2017: ALT 16; Hemoglobin 14.1; Platelets 236 04/19/2018: BUN 17; Creatinine, Ser 0.90; Potassium 4.4; Sodium 144   Recent Lipid Panel    Component Value Date/Time   CHOL 124 07/23/2013 0632   TRIG 69 07/23/2013 0632   HDL 68 07/23/2013 0632   CHOLHDL 1.8 07/23/2013 0632   VLDL 14 07/23/2013 0632   LDLCALC 42 07/23/2013 0632    Physical Exam:    VS:  BP 134/72  Pulse 71   Ht 5' 6.5" (1.689 m)   Wt 184 lb (83.5 kg)   LMP  (LMP Unknown)   SpO2 94%   BMI 29.25 kg/m     Wt Readings from Last 3 Encounters:  07/18/18 184 lb (83.5 kg)  06/02/18 185 lb (83.9 kg)  05/11/18 180 lb (81.6 kg)     GEN:  Well nourished, well developed in no acute distress HEENT: Normal NECK: No JVD; No carotid bruits LYMPHATICS: No lymphadenopathy CARDIAC: RRR, no murmurs, rubs, gallops RESPIRATORY:  Clear to auscultation without rales, wheezing or rhonchi  ABDOMEN: Soft, non-tender,  non-distended MUSCULOSKELETAL:  No edema; No deformity  SKIN: Warm and dry NEUROLOGIC:  Alert and oriented x 3 PSYCHIATRIC:  Normal affect   ASSESSMENT:    1. OSA (obstructive sleep apnea)   2. PAF (paroxysmal atrial fibrillation) ([place])   3. Syncope, unspecified syncope type    PLAN:    In order of problems listed above:  1.  OSA - the patient is tolerating PAP therapy well without any problems. The PAP download was reviewed today and showed an AHI of 10.5/hr on Auto BiPAP with 100% compliance in using more than 4 hours nightly.  The patient has been using and benefiting from PAP use and will continue to benefit from therapy. Her AHI has improved and we have finally found a mask that she tolerates.  The mask is still leaking and I suspect that she needs a medium mask for better fit.  I have asked her to go back to her DME to have her current mask refitted.  I will see her back in 3 months to see how she is doing.   2. PAF - she appears to be in NSR today.  She has not had any bleeding problems.  She will continue on Eliquis 5mg  BID and Cardizem CD 120mg  daily  She has some problems with constipation on the CCB but is going to try taking Miralax daily    3.  Syncope - this was felt to be due to dehydration. She has not had any further episodes of dizziness or syncope.      Medication Adjustments/Labs and Tests Ordered: Current medicines are reviewed at length with the patient today.  Concerns regarding medicines are outlined above.  No orders of the defined types were placed in this encounter.  No orders of the defined types were placed in this encounter.   Signed, Fransico Him, MD  07/18/2018 5:19 PM    [place]

## 2018-07-18 ENCOUNTER — Ambulatory Visit: Payer: Medicare Other | Admitting: Cardiology

## 2018-07-18 ENCOUNTER — Encounter: Payer: Self-pay | Admitting: Cardiology

## 2018-07-18 VITALS — BP 134/72 | HR 71 | Ht 66.5 in | Wt 184.0 lb

## 2018-07-18 DIAGNOSIS — R55 Syncope and collapse: Secondary | ICD-10-CM

## 2018-07-18 DIAGNOSIS — G4733 Obstructive sleep apnea (adult) (pediatric): Secondary | ICD-10-CM | POA: Diagnosis not present

## 2018-07-18 DIAGNOSIS — I480 Paroxysmal atrial fibrillation: Secondary | ICD-10-CM

## 2018-07-18 NOTE — Patient Instructions (Addendum)
Medication Instructions:  Your provider recommends that you continue on your current medications as directed. Please refer to the Current Medication list given to you today.    Labwork: None  Testing/Procedures: None  Follow-Up: You have an appointment with Dr. Radford Pax scheduled 10/24/2018 at 11:00AM.

## 2018-08-13 ENCOUNTER — Other Ambulatory Visit (HOSPITAL_COMMUNITY): Payer: Self-pay | Admitting: Nurse Practitioner

## 2018-08-18 ENCOUNTER — Ambulatory Visit: Payer: Medicare Other | Admitting: Internal Medicine

## 2018-09-13 ENCOUNTER — Ambulatory Visit: Payer: Medicare Other | Admitting: Cardiology

## 2018-09-14 ENCOUNTER — Ambulatory Visit: Payer: Medicare Other | Admitting: Cardiology

## 2018-10-12 ENCOUNTER — Ambulatory Visit: Payer: Medicare Other | Admitting: Podiatry

## 2018-10-18 ENCOUNTER — Telehealth: Payer: Self-pay | Admitting: Cardiology

## 2018-10-18 NOTE — Telephone Encounter (Signed)
Virtual Visit Pre-Appointment Phone Call  "(Name), I am calling you today to discuss your upcoming appointment. We are currently trying to limit exposure to the virus that causes COVID-19 by seeing patients at home rather than in the office."  1. "What is the BEST phone number to call the day of the visit?" - include this in appointment notes  2. Do you have or have access to (through a family member/friend) a smartphone with video capability that we can use for your visit?" a. If yes - list this number in appt notes as cell (if different from BEST phone #) and list the appointment type as a VIDEO visit in appointment notes b. If no - list the appointment type as a PHONE visit in appointment notes  3. Confirm consent - "In the setting of the current Covid19 crisis, you are scheduled for a (phone or video) visit with your provider on (date) at (time).  Just as we do with many in-office visits, in order for you to participate in this visit, we must obtain consent.  If you'd like, I can send this to your mychart (if signed up) or email for you to review.  Otherwise, I can obtain your verbal consent now.  All virtual visits are billed to your insurance company just like a normal visit would be.  By agreeing to a virtual visit, we'd like you to understand that the technology does not allow for your provider to perform an examination, and thus may limit your provider's ability to fully assess your condition. If your provider identifies any concerns that need to be evaluated in person, we will make arrangements to do so.  Finally, though the technology is pretty good, we cannot assure that it will always work on either your or our end, and in the setting of a video visit, we may have to convert it to a phone-only visit.  In either situation, we cannot ensure that we have a secure connection.  Are you willing to proceed?" STAFF: Did the patient verbally acknowledge consent to telehealth visit? Document  YES/NO here: yes/Telephone only   [phone]/ verbal consent 10/18/18  4. Advise patient to be prepared - "Two hours prior to your appointment, go ahead and check your blood pressure, pulse, oxygen saturation, and your weight (if you have the equipment to check those) and write them all down. When your visit starts, your provider will ask you for this information. If you have an Apple Watch or Kardia device, please plan to have heart rate information ready on the day of your appointment. Please have a pen and paper handy nearby the day of the visit as well."  5. Give patient instructions for MyChart download to smartphone OR Doximity/Doxy.me as below if video visit (depending on what platform provider is using)  6. Inform patient they will receive a phone call 15 minutes prior to their appointment time (may be from unknown caller ID) so they should be prepared to answer    TELEPHONE CALL NOTE  LARETTA PYATT has been deemed a candidate for a follow-up tele-health visit to limit community exposure during the Covid-19 pandemic. I spoke with the patient via phone to ensure availability of phone/video source, confirm preferred email & phone number, and discuss instructions and expectations.  I reminded [name] to be prepared with any vital sign and/or heart rhythm information that could potentially be obtained via home monitoring, at the time of her visit. I reminded Maygen L  Salonga to expect a phone call prior to her visit.  Armando Gang 10/18/2018 12:07 PM   INSTRUCTIONS FOR DOWNLOADING THE MYCHART APP TO SMARTPHONE  - The patient must first make sure to have activated MyChart and know their login information - If Apple, go to [employer] and type in MyChart in the search bar and download the app. If Android, ask patient to go to [employer] and type in [place] in the search bar and download the app. The app is free but as with any other app downloads, their phone may require  them to verify saved payment information or Apple/Android password.  - The patient will need to then log into the app with their MyChart username and password, and select  as their healthcare provider to link the account. When it is time for your visit, go to the MyChart app, find appointments, and click Begin Video Visit. Be sure to Select Allow for your device to access the Microphone and Camera for your visit. You will then be connected, and your provider will be with you shortly.  **If they have any issues connecting, or need assistance please contact MyChart service desk [phone])**  **If using a computer, in order to ensure the best quality for their visit they will need to use either of the following Internet Browsers: [employer], or Google Chrome**  IF USING DOXIMITY or DOXY.ME - The patient will receive a link just prior to their visit by text.     FULL LENGTH CONSENT FOR TELE-HEALTH VISIT   I hereby voluntarily request, consent and authorize [facility] and its employed or contracted physicians, physician assistants, nurse practitioners or other licensed health care professionals (the Practitioner), to provide me with telemedicine health care services (the Services") as deemed necessary by the treating Practitioner. I acknowledge and consent to receive the Services by the Practitioner via telemedicine. I understand that the telemedicine visit will involve communicating with the Practitioner through live audiovisual communication technology and the disclosure of certain medical information by electronic transmission. I acknowledge that I have been given the opportunity to request an in-person assessment or other available alternative prior to the telemedicine visit and am voluntarily participating in the telemedicine visit.  I understand that I have the right to withhold or withdraw my consent to the use of telemedicine in the course of my care at any  time, without affecting my right to future care or treatment, and that the Practitioner or I may terminate the telemedicine visit at any time. I understand that I have the right to inspect all information obtained and/or recorded in the course of the telemedicine visit and may receive copies of available information for a reasonable fee.  I understand that some of the potential risks of receiving the Services via telemedicine include:   Delay or interruption in medical evaluation due to technological equipment failure or disruption;  Information transmitted may not be sufficient (e.g. poor resolution of images) to allow for appropriate medical decision making by the Practitioner; and/or   In rare instances, security protocols could fail, causing a breach of personal health information.  Furthermore, I acknowledge that it is my responsibility to provide information about my medical history, conditions and care that is complete and accurate to the best of my ability. I acknowledge that Practitioner's advice, recommendations, and/or decision may be based on factors not within their control, such as incomplete or inaccurate data provided by me or distortions of diagnostic images or  specimens that may result from electronic transmissions. I understand that the practice of medicine is not an exact science and that Practitioner makes no warranties or guarantees regarding treatment outcomes. I acknowledge that I will receive a copy of this consent concurrently upon execution via email to the email address I last provided but may also request a printed copy by calling the office of [facility].    I understand that my insurance will be billed for this visit.   I have read or had this consent read to me.  I understand the contents of this consent, which adequately explains the benefits and risks of the Services being provided via telemedicine.   I have been provided ample opportunity to ask questions  regarding this consent and the Services and have had my questions answered to my satisfaction.  I give my informed consent for the services to be provided through the use of telemedicine in my medical care  By participating in this telemedicine visit I agree to the above.

## 2018-10-19 ENCOUNTER — Ambulatory Visit: Payer: Medicare Other | Admitting: Internal Medicine

## 2018-10-24 ENCOUNTER — Other Ambulatory Visit: Payer: Self-pay

## 2018-10-24 ENCOUNTER — Encounter: Payer: Self-pay | Admitting: Cardiology

## 2018-10-24 ENCOUNTER — Telehealth (INDEPENDENT_AMBULATORY_CARE_PROVIDER_SITE_OTHER): Payer: Medicare Other | Admitting: Cardiology

## 2018-10-24 DIAGNOSIS — G4733 Obstructive sleep apnea (adult) (pediatric): Secondary | ICD-10-CM

## 2018-10-24 DIAGNOSIS — I480 Paroxysmal atrial fibrillation: Secondary | ICD-10-CM | POA: Diagnosis not present

## 2018-10-24 DIAGNOSIS — R55 Syncope and collapse: Secondary | ICD-10-CM | POA: Diagnosis not present

## 2018-10-24 NOTE — Patient Instructions (Signed)
Medication Instructions:  Your physician recommends that you continue on your current medications as directed. Please refer to the Current Medication list given to you today.  If you need a refill on your cardiac medications before your next appointment, please call your pharmacy.   Lab work: BMET and CBC on 12/05/18  If you have labs (blood work) drawn today and your tests are completely normal, you will receive your results only by: Marland Kitchen MyChart Message (if you have MyChart) OR . A paper copy in the mail If you have any lab test that is abnormal or we need to change your treatment, we will call you to review the results.  Testing/Procedures: None  Follow-Up: At [facility], you and your health needs are our priority.  As part of our continuing mission to provide you with exceptional heart care, we have created designated Provider Care Teams.  These Care Teams include your primary Cardiologist (physician) and Advanced Practice Providers (APPs -  Physician Assistants and Nurse Practitioners) who all work together to provide you with the care you need, when you need it. You will need a follow up appointment in 6 months.  Please call our office 2 months in advance to schedule this appointment.  You may see Fransico Him, MD or one of the following Advanced Practice Providers on your designated Care Team:   [place], PA-C Melina Copa, PA-C . Ermalinda Barrios, PA-C

## 2018-10-24 NOTE — Progress Notes (Signed)
Virtual Visit via Telephone Note   This visit type was conducted due to national recommendations for restrictions regarding the COVID-19 Pandemic (e.g. social distancing) in an effort to limit this patient's exposure and mitigate transmission in our community.  Due to her co-morbid illnesses, this patient is at least at moderate risk for complications without adequate follow up.  This format is felt to be most appropriate for this patient at this time.  The patient did not have access to video technology/had technical difficulties with video requiring transitioning to audio format only (telephone).  All issues noted in this document were discussed and addressed.  No physical exam could be performed with this format.  Please refer to the patient's chart for her  consent to telehealth for Kimberly [name].  Evaluation Performed:  Follow-up visit  This visit type was conducted due to national recommendations for restrictions regarding the COVID-19 Pandemic (e.g. social distancing).  This format is felt to be most appropriate for this patient at this time.  All issues noted in this document were discussed and addressed.  No physical exam was performed (except for noted visual exam findings with Video Visits).  Please refer to the patient's chart (MyChart message for video visits and phone note for telephone visits) for the patient's consent to telehealth for Kimberly [name].  Date:  10/24/2018   ID:  Kimberly [name], DOB 05-May-1941, MRN [id number]  Patient Location:  Home  Provider location:   [place]  PCP:  Lajean Manes, MD  Cardiologist:  Fransico Him, MD Electrophysiologist:  None   Chief Complaint:  OSA and afib  History of Present Illness:    Kimberly [name] is a 78 y.o. female who presents via audio/video conferencing for a telehealth visit today.    [name] is a 78 y.o. female with a hx of PAF on metoprolol and Eliquis for chads vas score of 5. Metoprolol decreased by A.  fib clinic because of bradycardia.She also has a historyof chest pain with normal nuclear stress testandsyncope felt secondary to dehydration. She has a chronic RBBB with no ischemia on stress test a year.  She has obstructive sleep apnea on CPAP and was having problems with her device and finding a mask that she could tolerate. She was complaining that the pressure was too high on her auto device and had not been using it. Her device was changed to 5 to 10 cm H2O on auto but she was still not tolerating the pressure.  She was sent back to the Sleep lab for BiPAP titration which she has been tolerating well.  She is here today for followup and is doing well.  She denies any chest pain or pressure, SOB (except for allergies), DOE, PND, orthopnea,  dizziness or syncope. She rarely will have some skipped beats but thinks she is in NSR.  She has occasional LE edema but well controlled.  She is compliant with her meds and is tolerating meds with no SE.  She is doing well with her BiPAP device and thinks that she has gotten used to it.  She tolerates the mask and feels the pressure is adequate.  Since going on BiPAP she feels rested in the am and has no significant daytime sleepiness.  She denies any significant mouth or nasal dryness or nasal congestion.  She does not think that he snores.    The patient does not have symptoms concerning for COVID-19 infection (fever, chills, cough, or new shortness of breath).  Prior CV studies:   The following studies were reviewed today:  PAP compliance download  Past Medical History:  Diagnosis Date  . Allergy   . Anal polyp 1998   Flex Sig   . Anemia   . Angular blepharitis of left eye   . Ankle fracture    Stress fracture  . Anxiety   . Asthma    border line has inhaler  . Bunion   . Cataracts, bilateral   . Corneal scar    left eye  . Degenerative disc disease   . Depression   . Diverticulosis of colon (without mention of hemorrhage) 2010    Colonoscopy  . Dry eyes    bilateral  . Enterocele   . External hemorrhoids 2000   Colonoscopy  . Family history of malignant neoplasm of gastrointestinal tract   . Female cystocele   . Fibromyalgia   . GERD (gastroesophageal reflux disease)   . Hiatal hernia 2005,2010   EGD  . History of bronchitis   . History of measles   . History of mumps   . History of strep sore throat   . History of urinary tract infection   . Hyperlipemia   . Hypothyroidism   . IBS (irritable bowel syndrome)   . Imbalance   . Internal hemorrhoids without mention of complication 9735,3299   Colonoscopy   . Itching   . 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 Menopause   . Migraine   . OSA (obstructive sleep apnea) 05/14/2015   on BiPAP  . Osteopenia 10/2013   T score -1.6 FRAX 10%/1.6%  . PAF (paroxysmal atrial fibrillation) ([place])   . Pancreatitis   . PCO (posterior capsular opacification)    left  . Pneumonia    childhood illness  . PONV (postoperative nausea and vomiting)   . Pseudophakia, both eyes   . PVD (posterior vitreous detachment) right  . Rash    on back   . Retinal scar    left  . Rotator cuff disorder    pain, left shoulder  . Shingles   . Stricture and stenosis of esophagus 2005,2010   EGD   . Stroke ([place])   . Ulcerative colitis ([place])   . Varicose veins   . Vertigo   . Wears glasses    Past Surgical History:  Procedure Laterality Date  . ABDOMINAL HYSTERECTOMY    . ANAL FISSURE REPAIR    . BREAST EXCISIONAL BIOPSY Left   . BUNIONECTOMY  2014  . CATARACT EXTRACTION Bilateral 2013  . COLONOSCOPY     polyp removed  . ESOPHAGEAL DILATION    . EYE SURGERY Left   . FOOT SURGERY     left   . HEMORRHOID SURGERY    . MOUTH SURGERY  11/13/11   Cyst removed from gum-benign   . NASAL SEPTUM SURGERY    . NASAL SEPTUM SURGERY  1970's  . REPLACEMENT TOTAL KNEE Right 2011  . TONSILLECTOMY    . TOTAL ABDOMINAL HYSTERECTOMY W/ BILATERAL SALPINGOOPHORECTOMY  1991   TAH BSO  . TOTAL HIP  ARTHROPLASTY     right   . TOTAL KNEE ARTHROPLASTY     both  . TOTAL KNEE ARTHROPLASTY Left 06/23/2015   Procedure: LEFT TOTAL KNEE ARTHROPLASTY;  Surgeon: Gaynelle Arabian, MD;  Location: [facility];  Service: Orthopedics;  Laterality: Left;     Current Meds  Medication Sig  . acetaminophen (TYLENOL) 500 MG tablet Take 500 mg by mouth every 6 (six) hours  as needed for moderate pain.   . Calcium Carbonate-Vitamin D (CALTRATE 600+D) 600-400 MG-UNIT tablet Take 1 tablet by mouth 2 (two) times daily.  Marland Kitchen diltiazem (CARDIZEM CD) 120 MG 24 hr capsule TAKE 1 CAPSULE (120 MG TOTAL) BY MOUTH AT BEDTIME.  Marland Kitchen ELIQUIS 5 MG TABS tablet TAKE 1 TABLET BY MOUTH TWICE A DAY  . EPINEPHrine 0.3 mg/0.3 mL IJ SOAJ injection Inject 0.3 mg into the muscle as needed (for allergic reaction). Reported on 05/28/2015   . fluticasone (FLONASE) 50 MCG/ACT nasal spray Place 2 sprays into both nostrils every other day.   . levothyroxine (SYNTHROID) 100 MCG tablet Take 100 mcg by mouth daily before breakfast.  . NON FORMULARY [facility]  ANTIFUNGAL (NAIL)- #1  . Polyethylene Glycol 3350 (MIRALAX PO) Dissolve 9 grams in at least 8 ounces water/juice and drink once daily as needed for constipation.  . Polyvinyl Alcohol-Povidone (REFRESH OP) Apply to eye as needed (Dry eyes).  Marland Kitchen PROAIR HFA 108 (90 Base) MCG/ACT inhaler TAKE 2 PUFFS AS NEEDED EVERY 4-6 HOURS AS NEEDED COUGH/WHEEZE INHALATION 90  . Probiotic Product (ALIGN) 4 MG CAPS Take 1 capsule by mouth daily.  . RESTASIS 0.05 % ophthalmic emulsion Place 1 drop into both eyes 2 (two) times daily. Uses as directed  . rosuvastatin (CRESTOR) 10 MG tablet TAKE ONE HALF TABLET ONCE A DAY AT BEDTIME ORALLY 90 DAY(S)     Allergies:   Azithromycin; Erythromycin; Almond oil; Amoxicillin-pot clavulanate; Atorvastatin; Codeine; Ketoconazole; Ketoconazole; Latex; Lincomycin; Morphine; Morphine sulfate; Other; Shellfish allergy; Simvastatin; Sulfamethoxazole-trimethoprim; Clindamycin;  Clindamycin/lincomycin; and Erythromycin base   Social History   Tobacco Use  . Smoking status: Former Smoker    Packs/day: 0.25    Years: 15.00    Pack years: 3.75    Types: Cigarettes    Last attempt to quit: 06/01/1975    Years since quitting: 43.4  . Smokeless tobacco: Never Used  . Tobacco comment: Stopped smoking age 16  Substance Use Topics  . Alcohol use: No    Alcohol/week: 0.0 standard drinks  . Drug use: No     Family Hx: The patient's family history includes Alcohol abuse in her father; Allergies in her sister; Alzheimer's disease in her mother; Arthritis in her brother; Breast cancer (age of onset: 70) in her sister; Colon cancer in her paternal aunt; Congestive Heart Failure in her father; Dementia in her mother; Diabetes in her sister; Heart attack in her maternal grandfather, maternal grandmother, and paternal grandfather; Heart disease in her maternal grandmother; Hypertension in her father; Irritable bowel syndrome in her mother; Other in her sister and sister; Stroke in her mother. There is no history of Esophageal cancer, Rectal cancer, or Stomach cancer.  ROS:   Please see the history of present illness.     All other systems reviewed and are negative.   Labs/Other Tests and Data Reviewed:    Recent Labs: 04/19/2018: BUN 17; Creatinine, Ser 0.90; Potassium 4.4; Sodium 144   Recent Lipid Panel Lab Results  Component Value Date/Time   CHOL 124 07/23/2013 06:32 AM   TRIG 69 07/23/2013 06:32 AM   HDL 68 07/23/2013 06:32 AM   CHOLHDL 1.8 07/23/2013 06:32 AM   LDLCALC 42 07/23/2013 06:32 AM    Wt Readings from Last 3 Encounters:  10/24/18 182 lb (82.6 kg)  07/18/18 184 lb (83.5 kg)  06/02/18 185 lb (83.9 kg)     Objective:    Vital Signs:  Ht 5\' 7"  (1.702 m)   Wt  182 lb (82.6 kg)   LMP  (LMP Unknown)   BMI 28.51 kg/m     ASSESSMENT & PLAN:    1.  OSA - the patient is tolerating PAP therapy well without any problems. The PAP download was  reviewed today and showed an AHI of 6.9/hr on auto PAP cm H2O with 97% compliance in using more than 4 hours nightly.  The patient has been using and benefiting from PAP use and will continue to benefit from therapy.  She does have a mask leak still which explains the slightly increased AHI.  She is going back to her DME to see about an additional appliance to add her mask to see if it will help her leak.  2.  PAF -she is maintaining sinus rhythm with only a rare palpitation.  She is on Eliquis and has not had any bleeding problems.  Last creatinine was 0.9 on 04/19/2018.  She will continue on Eliquis 5 mg twice daily and Cardizem CD 120 mg daily.  I will set her up for a repeat bmet and hemoglobin in July.  3.  Syncope -she has not had any recurrence of syncope and this was felt to be due to dehydration.  4.  COVID-19 Education:The signs and symptoms of COVID-19 were discussed with the patient and how to seek care for testing (follow up with PCP or arrange E-visit).  The importance of social distancing was discussed today.  Patient Risk:   After full review of this patient's clinical status, I feel that they are at least moderate risk at this time.  Time:   Today, I have spent 15 minutes directly with the patient on telephone discussing medical problems including Afib, OSA.  We also reviewed the symptoms of COVID 19 and the ways to protect against contracting the virus with telehealth technology.  I spent an additional 5 minutes reviewing patient's chart including PAP compliance download.  Medication Adjustments/Labs and Tests Ordered: Current medicines are reviewed at length with the patient today.  Concerns regarding medicines are outlined above.  Tests Ordered: No orders of the defined types were placed in this encounter.  Medication Changes: No orders of the defined types were placed in this encounter.   Disposition:  Follow up in 6 month(s)  Signed, Fransico Him, MD  10/24/2018 11:12  AM    [place]

## 2018-10-24 NOTE — Addendum Note (Signed)
Addended by: Sarina Ill on: 10/24/2018 12:19 PM   Modules accepted: Orders

## 2018-10-27 ENCOUNTER — Ambulatory Visit: Payer: Medicare Other | Admitting: Internal Medicine

## 2018-11-23 ENCOUNTER — Ambulatory Visit: Payer: Medicare Other | Admitting: Podiatry

## 2018-11-29 ENCOUNTER — Encounter: Payer: Self-pay | Admitting: Podiatry

## 2018-11-29 ENCOUNTER — Ambulatory Visit: Payer: Medicare Other | Admitting: Podiatry

## 2018-11-29 ENCOUNTER — Other Ambulatory Visit: Payer: Self-pay

## 2018-11-29 VITALS — Temp 97.6°F

## 2018-11-29 DIAGNOSIS — M79674 Pain in right toe(s): Secondary | ICD-10-CM

## 2018-11-29 DIAGNOSIS — B351 Tinea unguium: Secondary | ICD-10-CM | POA: Diagnosis not present

## 2018-11-29 DIAGNOSIS — L84 Corns and callosities: Secondary | ICD-10-CM

## 2018-11-29 DIAGNOSIS — M79675 Pain in left toe(s): Secondary | ICD-10-CM

## 2018-11-29 DIAGNOSIS — Z7901 Long term (current) use of anticoagulants: Secondary | ICD-10-CM | POA: Diagnosis not present

## 2018-11-29 NOTE — Patient Instructions (Signed)

## 2018-12-03 NOTE — Progress Notes (Signed)
Subjective: MISTIE ADNEY presents to clinic with cc of painful mycotic toenails and callus of left foot which are aggravated when weightbearing with and without shoe gear.  This pain limits her daily activities. Pain symptoms resolve with periodic professional debridement.  She is also on blood thinner, Eliquis.  Lajean Manes, MD is his PCP. Last visit was 3 months ago per patient recall.   Current Outpatient Medications:  .  acetaminophen (TYLENOL) 500 MG tablet, Take 500 mg by mouth every 6 (six) hours as needed for moderate pain. , Disp: , Rfl:  .  amoxicillin (AMOXIL) 500 MG capsule, TAKE 4 CAPSULES BY MOUTH 1 HOUR BEFORE APPOINTMENT, Disp: , Rfl:  .  Calcium Carbonate-Vitamin D (CALTRATE 600+D) 600-400 MG-UNIT tablet, Take 1 tablet by mouth 2 (two) times daily., Disp: , Rfl:  .  diltiazem (CARDIZEM CD) 120 MG 24 hr capsule, TAKE 1 CAPSULE (120 MG TOTAL) BY MOUTH AT BEDTIME., Disp: 90 capsule, Rfl: 2 .  ELIQUIS 5 MG TABS tablet, TAKE 1 TABLET BY MOUTH TWICE A DAY, Disp: 180 tablet, Rfl: 2 .  EPINEPHrine 0.3 mg/0.3 mL IJ SOAJ injection, Inject 0.3 mg into the muscle as needed (for allergic reaction). Reported on 05/28/2015 , Disp: , Rfl:  .  fluticasone (FLONASE) 50 MCG/ACT nasal spray, Place 2 sprays into both nostrils every other day. , Disp: , Rfl:  .  levothyroxine (SYNTHROID) 100 MCG tablet, Take 100 mcg by mouth daily before breakfast., Disp: , Rfl:  .  montelukast (SINGULAIR) 10 MG tablet, every evening., Disp: , Rfl:  .  Multiple Vitamin (MULTI-VITAMIN) tablet, Take by mouth., Disp: , Rfl:  .  NON FORMULARY, [facility]  ANTIFUNGAL (NAIL)- #1, Disp: , Rfl:  .  Polyethylene Glycol 3350 (MIRALAX PO), Dissolve 9 grams in at least 8 ounces water/juice and drink once daily as needed for constipation., Disp: , Rfl:  .  Polyvinyl Alcohol-Povidone (REFRESH OP), Apply to eye as needed (Dry eyes)., Disp: , Rfl:  .  PROAIR HFA 108 (90 Base) MCG/ACT inhaler, TAKE 2 PUFFS AS NEEDED  EVERY 4-6 HOURS AS NEEDED COUGH/WHEEZE INHALATION 90, Disp: , Rfl: 0 .  Probiotic Product (ALIGN) 4 MG CAPS, Take 1 capsule by mouth daily., Disp: , Rfl:  .  RESTASIS 0.05 % ophthalmic emulsion, Place 1 drop into both eyes 2 (two) times daily. Uses as directed, Disp: , Rfl:  .  rosuvastatin (CRESTOR) 10 MG tablet, TAKE ONE HALF TABLET ONCE A DAY AT BEDTIME ORALLY 90 DAY(S), Disp: , Rfl: 4   Allergies  Allergen Reactions  . Azithromycin Other (See Comments)    Pancreatitis  . Erythromycin Hives    Reaction 1 time, when she was younger. Reaction 1 time, when she was younger.   Toniann Ket Other (See Comments)    Almonds: per allergy testing.  . Amoxicillin-Pot Clavulanate Nausea And Vomiting and Nausea Only    Has patient had a PCN reaction causing immediate rash, facial/tongue/throat swelling, SOB or lightheadedness with hypotension: No Has patient had a PCN reaction causing severe rash involving mucus membranes or skin necrosis: No Has patient had a PCN reaction that required hospitalization No Has patient had a PCN reaction occurring within the last 10 years: No If all of the above answers are "NO", then may proceed with Cephalosporin use.   . Atorvastatin Other (See Comments)    Muscle weakness  Muscle weakness   . Codeine Nausea Only  . Ketoconazole Nausea And Vomiting  . Ketoconazole Nausea Only and  Nausea And Vomiting  . Latex Other (See Comments)    Other  . Lincomycin     Stomach upset   . Morphine Nausea Only  . Morphine Sulfate Nausea Only and Other (See Comments)      Unknown  . Other Other (See Comments)    Shellfish mix: per allergy testing.  . Shellfish Allergy Other (See Comments)    Shellfish mix: per allergy testing.  . Simvastatin Other (See Comments)    Muscle pain  . Sulfamethoxazole-Trimethoprim Nausea Only    Upset stomach   . Clindamycin Rash  . Clindamycin/Lincomycin Nausea And Vomiting and Rash  . Erythromycin Base Rash      Objective: Vitals:   11/29/18 1058  Temp: 97.6 F (36.4 C)    Physical Examination:  Vascular  Examination: Capillary refill time immediate x 10 digits.  Palpable DP/PT pulses b/l.  Digital hair present b/l.  No edema noted b/l.  Skin temperature gradient WNL b/l.  Dermatological Examination: Skin with normal turgor, texture and tone b/l.  No open wounds b/l.  No interdigital macerations noted b/l.  Elongated, thick, discolored brittle toenails with subungual debris and pain on dorsal palpation of nailbeds 1-5 b/l.  Hyperkeratotic lesion plantar left foot with tenderness to palpation. No edema, no erythema, no drainage, no flocculence..  Musculoskeletal Examination: Muscle strength 5/5 to all muscle groups b/l.  Hammertoes 2-5 b/l right>left.   No pain, crepitus or joint discomfort with active/passive ROM.  Neurological Examination: Sensation intact 5/5 b/l with 10 gram monofilament.  Vibratory sensation intact b/l.  Proprioceptive sensation intact b/l.  Assessment: 1. Mycotic nail infection with pain 1-5 b/l 2. Callus left foot 3. Chronic anticoagulation  Plan: 1. Toenails 1-5 b/l were debrided in length and girth without iatrogenic laceration. 2. Callus pared plantar left foot utilizing sterile scalpel blade without incident. 3. Continue soft, supportive shoe gear daily. 4. Report any pedal injuries to medical professional. 5. Follow up 3 months. 6. Patient/POA to call should there be a question/concern in there interim.

## 2018-12-04 ENCOUNTER — Emergency Department (HOSPITAL_BASED_OUTPATIENT_CLINIC_OR_DEPARTMENT_OTHER)
Admission: EM | Admit: 2018-12-04 | Discharge: 2018-12-04 | Disposition: A | Discharge status: Home / Self Care | Payer: Medicare Other | Attending: Emergency Medicine | Admitting: Emergency Medicine

## 2018-12-04 ENCOUNTER — Other Ambulatory Visit: Payer: Self-pay

## 2018-12-04 ENCOUNTER — Encounter (HOSPITAL_BASED_OUTPATIENT_CLINIC_OR_DEPARTMENT_OTHER): Payer: Self-pay | Admitting: *Deleted

## 2018-12-04 DIAGNOSIS — R197 Diarrhea, unspecified: Secondary | ICD-10-CM | POA: Insufficient documentation

## 2018-12-04 DIAGNOSIS — Z7901 Long term (current) use of anticoagulants: Secondary | ICD-10-CM | POA: Diagnosis not present

## 2018-12-04 DIAGNOSIS — E039 Hypothyroidism, unspecified: Secondary | ICD-10-CM | POA: Diagnosis not present

## 2018-12-04 DIAGNOSIS — J45909 Unspecified asthma, uncomplicated: Secondary | ICD-10-CM | POA: Diagnosis not present

## 2018-12-04 DIAGNOSIS — Z79899 Other long term (current) drug therapy: Secondary | ICD-10-CM | POA: Diagnosis not present

## 2018-12-04 DIAGNOSIS — R112 Nausea with vomiting, unspecified: Secondary | ICD-10-CM | POA: Diagnosis not present

## 2018-12-04 DIAGNOSIS — Z96641 Presence of right artificial hip joint: Secondary | ICD-10-CM | POA: Diagnosis not present

## 2018-12-04 DIAGNOSIS — Z87891 Personal history of nicotine dependence: Secondary | ICD-10-CM | POA: Insufficient documentation

## 2018-12-04 DIAGNOSIS — Z96653 Presence of artificial knee joint, bilateral: Secondary | ICD-10-CM | POA: Insufficient documentation

## 2018-12-04 LAB — CBC WITH DIFFERENTIAL/PLATELET
Abs Immature Granulocytes: 0.03 10*3/uL (ref 0.00–0.07)
Basophils Absolute: 0 10*3/uL (ref 0.0–0.1)
Basophils Relative: 0 %
Eosinophils Absolute: 0.1 10*3/uL (ref 0.0–0.5)
Eosinophils Relative: 0 %
HCT: 38.4 % (ref 36.0–46.0)
Hemoglobin: 12.7 g/dL (ref 12.0–15.0)
Immature Granulocytes: 0 %
Lymphocytes Relative: 8 %
Lymphs Abs: 0.9 10*3/uL (ref 0.7–4.0)
MCH: 31.2 pg (ref 26.0–34.0)
MCHC: 33.1 g/dL (ref 30.0–36.0)
MCV: 94.3 fL (ref 80.0–100.0)
Monocytes Absolute: 1 10*3/uL (ref 0.1–1.0)
Monocytes Relative: 9 %
Neutro Abs: 9.4 10*3/uL — ABNORMAL HIGH (ref 1.7–7.7)
Neutrophils Relative %: 83 %
Platelets: 185 10*3/uL (ref 150–400)
RBC: 4.07 MIL/uL (ref 3.87–5.11)
RDW: 12.4 % (ref 11.5–15.5)
WBC: 11.3 10*3/uL — ABNORMAL HIGH (ref 4.0–10.5)
nRBC: 0 % (ref 0.0–0.2)

## 2018-12-04 LAB — COMPREHENSIVE METABOLIC PANEL
ALT: 15 U/L (ref 0–44)
AST: 21 U/L (ref 15–41)
Albumin: 4 g/dL (ref 3.5–5.0)
Alkaline Phosphatase: 41 U/L (ref 38–126)
Anion gap: 7 (ref 5–15)
BUN: 16 mg/dL (ref 8–23)
CO2: 24 mmol/L (ref 22–32)
Calcium: 8.8 mg/dL — ABNORMAL LOW (ref 8.9–10.3)
Chloride: 105 mmol/L (ref 98–111)
Creatinine, Ser: 0.67 mg/dL (ref 0.44–1.00)
GFR calc Af Amer: >60 mL/min (ref >60)
GFR calc non Af Amer: >60 mL/min (ref >60)
Glucose, Bld: 114 mg/dL — ABNORMAL HIGH (ref 70–99)
Potassium: 3.4 mmol/L — ABNORMAL LOW (ref 3.5–5.1)
Sodium: 136 mmol/L (ref 135–145)
Total Bilirubin: 0.5 mg/dL (ref 0.3–1.2)
Total Protein: 6.8 g/dL (ref 6.5–8.1)

## 2018-12-04 LAB — LIPASE, BLOOD: Lipase: 46 U/L (ref 11–51)

## 2018-12-04 MED ORDER — ONDANSETRON HCL 4 MG/2ML IJ SOLN
4.0000 mg | Freq: Once | INTRAMUSCULAR | Status: AC
  Administered 2018-12-04: 4 mg via INTRAVENOUS
  Filled 2018-12-04: qty 2

## 2018-12-04 MED ORDER — ONDANSETRON 4 MG PO TBDP: 4.0000 mg | ORAL_TABLET | Freq: Three times a day (TID) | ORAL | 0 refills | Status: DC | PRN

## 2018-12-04 NOTE — ED Triage Notes (Signed)
C/o n/v/d x 1 hr after taking Augmentin

## 2018-12-04 NOTE — ED Provider Notes (Signed)
[facility] EMERGENCY DEPARTMENT Provider Note   CSN: [id number] Arrival date & time: 12/04/18  1847    History   Chief Complaint Chief Complaint  Patient presents with  . Emesis    HPI Kimberly [name] is a 78 y.o. female.     HPI Patient presents with some dull upper abdominal pain nausea vomiting and mild diarrhea.  Began around 1 hour after she took Augmentin.  States she was given it by her ophthalmologist for her angular blepharitis.  States that she had discussed amoxicillin with him but then was given Augmentin.  States Augmentin along with azithromycin is given her pancreatitis in the past.  States she is worried she has a pancreatitis.  States she is felt bad for a couple days.  Has some dull abdominal pain.  No fevers.  No blood in the stool.  She is on anticoagulation for paroxysmal atrial fibrillation.  Patient states that she took Zofran, but she vomited it up. Past Medical History:  Diagnosis Date  . Allergy   . Anal polyp 1998   Flex Sig   . Anemia   . Angular blepharitis of left eye   . Ankle fracture    Stress fracture  . Anxiety   . Asthma    border line has inhaler  . Bunion   . Cataracts, bilateral   . Corneal scar    left eye  . Degenerative disc disease   . Depression   . Diverticulosis of colon (without mention of hemorrhage) 2010   Colonoscopy  . Dry eyes    bilateral  . Enterocele   . External hemorrhoids 2000   Colonoscopy  . Family history of malignant neoplasm of gastrointestinal tract   . Female cystocele   . Fibromyalgia   . GERD (gastroesophageal reflux disease)   . Hiatal hernia 2005,2010   EGD  . History of bronchitis   . History of measles   . History of mumps   . History of strep sore throat   . History of urinary tract infection   . Hyperlipemia   . Hypothyroidism   . IBS (irritable bowel syndrome)   . Imbalance   . Internal hemorrhoids without mention of complication [id number]   Colonoscopy   . Itching   .  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 Menopause   . Migraine   . OSA (obstructive sleep apnea) 05/14/2015   on BiPAP  . Osteopenia 10/2013   T score -1.6 FRAX 10%/1.6%  . PAF (paroxysmal atrial fibrillation) ([place])   . Pancreatitis   . PCO (posterior capsular opacification)    left  . Pneumonia    childhood illness  . PONV (postoperative nausea and vomiting)   . Pseudophakia, both eyes   . PVD (posterior vitreous detachment) right  . Rash    on back   . Retinal scar    left  . Rotator cuff disorder    pain, left shoulder  . Shingles   . Stricture and stenosis of esophagus 2005,2010   EGD   . Stroke ([place])   . Ulcerative colitis ([place])   . Varicose veins   . Vertigo   . Wears glasses     Patient Active Problem List   Diagnosis Date Noted  . Adiposity 07/13/2018  . Excess weight 07/13/2018  . Hay fever 07/13/2018  . PAF (paroxysmal atrial fibrillation) ([place]) 08/25/2017  . Syncope 05/13/2016  . Heart palpitations 05/13/2016  . Rhinitis, chronic 02/04/2016  . IBS (irritable bowel  syndrome) 07/14/2015  . Nausea without vomiting 07/14/2015  . OA (osteoarthritis) of knee 06/23/2015  . OSA (obstructive sleep apnea) 05/14/2015  . Preoperative clearance 01/08/2015  . Acute infection of nasal sinus 02/25/2014  . Pancreatitis 07/22/2013  . Leukocytosis, unspecified 07/22/2013  . Amblyopia 03/07/2013  . Retinal scar 03/07/2013  . Cellulitis 01/15/2013  . 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 Angular blepharitis 10/11/2012  . Dry eyes 10/11/2012  . PCO (posterior capsular opacification) 10/11/2012  . PVD (posterior vitreous detachment) 10/11/2012  . 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 Cataracts, bilateral   . Elevated cholesterol   . Degenerative disc disease   . Bunion   . Ankle fracture   . 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 Hemorrhoid   . DUB (dysfunctional uterine bleeding)   . DIARRHEA 09/16/2009  . PERSONAL HX COLONIC POLYPS 09/16/2009  . LACUNAR INFARCTION 07/16/2008  .  CONSTIPATION 07/12/2008  . CHEST PAIN 07/12/2008  . DYSPHAGIA 07/12/2008  . HYPOTHYROIDISM 07/10/2008  . ANXIETY 07/10/2008  . DEPRESSION 07/10/2008  . INTERNAL HEMORRHOIDS 07/10/2008  . ESOPHAGEAL STRICTURE 07/10/2008  . GERD 07/10/2008  . HIATAL HERNIA 07/10/2008  . DIVERTICULOSIS, COLON 07/10/2008  . IRRITABLE BOWEL SYNDROME 07/10/2008  . Osteoarthritis 07/10/2008  . FIBROMYALGIA 07/10/2008  . Blues 07/10/2008    Past Surgical History:  Procedure Laterality Date  . ABDOMINAL HYSTERECTOMY    . ANAL FISSURE REPAIR    . BREAST EXCISIONAL BIOPSY Left   . BUNIONECTOMY  2014  . CATARACT EXTRACTION Bilateral 2013  . COLONOSCOPY     polyp removed  . ESOPHAGEAL DILATION    . EYE SURGERY Left   . FOOT SURGERY     left   . HEMORRHOID SURGERY    . MOUTH SURGERY  11/13/11   Cyst removed from gum-benign   . NASAL SEPTUM SURGERY    . NASAL SEPTUM SURGERY  1970's  . REPLACEMENT TOTAL KNEE Right 2011  . TONSILLECTOMY    . TOTAL ABDOMINAL HYSTERECTOMY W/ BILATERAL SALPINGOOPHORECTOMY  1991   TAH BSO  . TOTAL HIP ARTHROPLASTY     right   . TOTAL KNEE ARTHROPLASTY     both  . TOTAL KNEE ARTHROPLASTY Left 06/23/2015   Procedure: LEFT TOTAL KNEE ARTHROPLASTY;  Surgeon: Gaynelle Arabian, MD;  Location: [facility];  Service: Orthopedics;  Laterality: Left;     OB History    Gravida  0   Para      Term      Preterm      AB      Living        SAB      TAB      Ectopic      Multiple      Live Births               Home Medications    Prior to Admission medications   Medication Sig Start Date End Date Taking? Authorizing Provider  Calcium Carbonate-Vitamin D (CALTRATE 600+D) 600-400 MG-UNIT tablet Take 1 tablet by mouth 2 (two) times daily.   Yes [provider]  diltiazem (CARDIZEM CD) 120 MG 24 hr capsule TAKE 1 CAPSULE (120 MG TOTAL) BY MOUTH AT BEDTIME. 08/14/18 08/14/19 Yes Sherran Needs, NP  ELIQUIS 5 MG TABS tablet TAKE 1 TABLET BY MOUTH TWICE A DAY  05/08/18  Yes Sherran Needs, NP  fluticasone (FLONASE) 50 MCG/ACT nasal spray Place 2 sprays into both nostrils every other day.  06/06/13  Yes [provider]  levothyroxine (SYNTHROID) 100 MCG tablet Take 100 mcg by mouth daily before breakfast.   Yes [provider]  Polyethylene Glycol 3350 (MIRALAX PO) Dissolve 9 grams in at least 8 ounces water/juice and drink once daily as needed for constipation.   Yes [provider]  Polyvinyl Alcohol-Povidone (REFRESH OP) Apply to eye as needed (Dry eyes).   Yes [provider]  RESTASIS 0.05 % ophthalmic emulsion Place 1 drop into both eyes 2 (two) times daily. Uses as directed 09/30/12  Yes [provider]  rosuvastatin (CRESTOR) 10 MG tablet TAKE ONE HALF TABLET ONCE A DAY AT BEDTIME ORALLY 90 DAY(S) 03/28/15  Yes [provider]  acetaminophen (TYLENOL) 500 MG tablet Take 500 mg by mouth every 6 (six) hours as needed for moderate pain.     [provider]  amoxicillin (AMOXIL) 500 MG capsule TAKE 4 CAPSULES BY MOUTH 1 HOUR BEFORE APPOINTMENT 07/30/18   [provider]  EPINEPHrine 0.3 mg/0.3 mL IJ SOAJ injection Inject 0.3 mg into the muscle as needed (for allergic reaction). Reported on 05/28/2015     [provider]  montelukast (SINGULAIR) 10 MG tablet every evening. 07/30/18   [provider]  Multiple Vitamin (MULTI-VITAMIN) tablet Take by mouth.    [provider]  NON FORMULARY [facility]  ANTIFUNGAL (NAIL)- #1    [provider]  ondansetron (ZOFRAN-ODT) 4 MG disintegrating tablet Take 1 tablet (4 mg total) by mouth every 8 (eight) hours as needed for nausea or vomiting. 12/04/18   Davonna Belling, MD  PROAIR HFA 108 [id number] Base) MCG/ACT inhaler TAKE 2 PUFFS AS NEEDED EVERY 4-6 HOURS AS NEEDED COUGH/WHEEZE INHALATION 90 04/09/15   [provider]  Probiotic Product (ALIGN) 4 MG CAPS Take 1 capsule by mouth daily.    [provider]    Family History Family History  Problem Relation Age of Onset  . Dementia Mother   . Irritable bowel syndrome Mother   . Alzheimer's disease Mother   . Stroke Mother   . Hypertension Father   . Congestive Heart Failure Father   . Alcohol abuse Father   . Diabetes Sister   . Colon cancer Paternal Aunt   . Heart disease Maternal Grandmother   . Heart attack Maternal Grandmother   . Heart attack Paternal Grandfather   . Heart attack Maternal Grandfather   . Arthritis Brother   . Other Sister        pituitary disease  . Allergies Sister   . Other Sister        joint problems  . Breast cancer Sister 28  . Esophageal cancer Neg Hx   . Rectal cancer Neg Hx   . Stomach cancer Neg Hx     Social History Social History   Tobacco Use  . Smoking status: Former Smoker    Packs/day: 0.25    Years: 15.00    Pack years: 3.75    Types: Cigarettes    Quit date: 06/01/1975    Years since quitting: 43.5  . Smokeless tobacco: Never Used  . Tobacco comment: Stopped smoking age 57  Substance Use Topics  . Alcohol use: No    Alcohol/week: 0.0 standard drinks  . Drug use: No     Allergies   Azithromycin, Erythromycin, Almond oil, Amoxicillin-pot clavulanate, Atorvastatin, Codeine, Ketoconazole, Ketoconazole, Latex, Lincomycin, Morphine, Morphine sulfate, Other, Shellfish allergy, Simvastatin, Sulfamethoxazole-trimethoprim, Clindamycin, Clindamycin/lincomycin, and Erythromycin base   Review of Systems Review of Systems  Constitutional:  Positive for appetite change and fatigue. Negative for fever.  HENT: Negative for congestion.        Some swelling over left eyelid.  Respiratory: Negative for shortness of breath.   Gastrointestinal: Positive for abdominal pain, diarrhea, nausea and vomiting.  Genitourinary: Negative for flank pain.  Musculoskeletal: Negative for back pain.  Skin: Negative for rash.  Neurological: Negative for weakness.  Psychiatric/Behavioral:  Negative for confusion.     Physical Exam Updated Vital Signs BP (!) 160/87   Pulse 86   Temp 97.9 F (36.6 C)   Resp 20   Ht 5\' 7"  (1.702 m)   Wt 83.5 kg   LMP  (LMP Unknown)   SpO2 96%   BMI 28.82 kg/m   Physical Exam Vitals signs and nursing note reviewed.  HENT:     Head: Atraumatic.  Eyes:     Comments: Follow-up erythema and swelling of the lateral left upper eyelid.  Neck:     Musculoskeletal: Neck supple.  Cardiovascular:     Rate and Rhythm: Regular rhythm.  Pulmonary:     Effort: Pulmonary effort is normal.  Abdominal:     Tenderness: There is abdominal tenderness.     Comments: Mild tenderness of upper abdomen.  Skin:    Capillary Refill: Capillary refill takes less than 2 seconds.  Neurological:     Mental Status: Mental status is at baseline.      ED Treatments / Results  Labs (all labs ordered are listed, but only abnormal results are displayed) Labs Reviewed  CBC WITH DIFFERENTIAL/PLATELET - Abnormal; Notable for the following components:      Result Value   WBC 11.3 (*)    Neutro Abs 9.4 (*)    All other components within normal limits  COMPREHENSIVE METABOLIC PANEL - Abnormal; Notable for the following components:   Potassium 3.4 (*)    Glucose, Bld 114 (*)    Calcium 8.8 (*)    All other components within normal limits  LIPASE, BLOOD    EKG None  Radiology No results found.  Procedures Procedures (including critical care time)  Medications Ordered in ED Medications  ondansetron (ZOFRAN) injection 4 mg (4 mg Intravenous Given 12/04/18 1941)     Initial Impression / Assessment and Plan / ED Course  I have reviewed the triage vital signs and the nursing notes.  Pertinent labs & imaging results that were available during my care of the patient were reviewed by me and considered in my medical decision making (see chart for details).        Patient with nausea vomiting diarrhea.  Began after taking Augmentin.  States this  happens with takes Augmentin but also states she is had pancreatitis before.  Lab work reassuring.  Feels somewhat better.  Will call her ophthalmologist about medication change.  Discharge home.  Final Clinical Impressions(s) / ED Diagnoses   Final diagnoses:  Nausea vomiting and diarrhea    ED Discharge Orders         Ordered    ondansetron (ZOFRAN-ODT) 4 MG disintegrating tablet  Every 8 hours PRN     12/04/18 2217           Davonna Belling, MD 12/04/18 2308

## 2018-12-04 NOTE — Discharge Instructions (Addendum)
The nausea and vomiting could be related to the medication.  Call your ophthalmologist about adjustment of the medication.

## 2018-12-04 NOTE — ED Notes (Signed)
ED Provider at bedside. 

## 2018-12-05 ENCOUNTER — Telehealth: Payer: Self-pay | Admitting: Cardiology

## 2018-12-05 ENCOUNTER — Other Ambulatory Visit: Payer: Medicare Other

## 2018-12-05 NOTE — Telephone Encounter (Signed)
New Message:    Patient call to let the doctor know she had to go to the ER last night and stated they took some blood from. Patient would like for some one to call her back.

## 2018-12-05 NOTE — Telephone Encounter (Signed)
Spoke with the patient, she wanted to know if she still needed lab work. She does not since she got labs at ED.

## 2018-12-19 ENCOUNTER — Other Ambulatory Visit: Payer: Self-pay | Admitting: Gynecology

## 2018-12-19 DIAGNOSIS — Z1231 Encounter for screening mammogram for malignant neoplasm of breast: Secondary | ICD-10-CM

## 2018-12-22 ENCOUNTER — Encounter: Payer: Self-pay | Admitting: Internal Medicine

## 2018-12-22 ENCOUNTER — Telehealth: Payer: Self-pay | Admitting: Internal Medicine

## 2018-12-22 NOTE — Telephone Encounter (Signed)
Thanks for the note, I agree with your recommendations. I will see her next month and we can discuss and address her issues and concerns

## 2018-12-22 NOTE — Telephone Encounter (Addendum)
Appears that patient's allergy is to augmentin, a close "relative" to amoxicillin. Augmentin is listed in her chart as an allergy.  Patient tells me that she can take amoxicillin as she actually takes it with no problem before dental procedures. However, augmentin causes nausea, vomiting, diarrhea and elevation in her amylase and lipase. I assured her that we have noted in our system that she is indeed allergic to augmentin.  In speaking with patient's for 25+ minutes, she also explains that she has had a recent update in family history in that her brother and sister have both now been found to have colon polyps. She also has a family history of colon cancer in an aunt.  She has multiple questions ranging from what to do about her constipation to whether she can have a colonoscopy if she has bladder prolapse. I did advise her to take Miralax 17 g daily and liberalize water/fluid intake, increase fruit and vegetable intake to help constipation. She also requested I send a high fiber diet in the mail which I have done for her.  I have scheduled her to see Dr Hilarie Fredrickson in office on 01/15/19 to discuss her multiple concerns.  She then explains that she doesn't want to take pantoprazole 40 mg because she doesn't think she needs anything strong as she is worried that it could have side effect. I advised against stopping this until she speaks with Dr Hilarie Fredrickson.  She has recently been started on an anticoag which she would like to make Dr Hilarie Fredrickson aware of as well.

## 2018-12-22 NOTE — Telephone Encounter (Signed)
Pt reported that she had an allergic reaction to amoxicillin and was hospitalised.  Please update this in pt's chart.  FYI.

## 2018-12-25 ENCOUNTER — Ambulatory Visit (INDEPENDENT_AMBULATORY_CARE_PROVIDER_SITE_OTHER): Payer: Medicare Other | Admitting: Cardiology

## 2018-12-25 ENCOUNTER — Other Ambulatory Visit: Payer: Self-pay

## 2018-12-25 ENCOUNTER — Encounter: Payer: Self-pay | Admitting: Cardiology

## 2018-12-25 VITALS — BP 122/82 | HR 61 | Ht 67.0 in | Wt 181.0 lb

## 2018-12-25 DIAGNOSIS — R55 Syncope and collapse: Secondary | ICD-10-CM | POA: Diagnosis not present

## 2018-12-25 DIAGNOSIS — R079 Chest pain, unspecified: Secondary | ICD-10-CM | POA: Insufficient documentation

## 2018-12-25 DIAGNOSIS — R0789 Other chest pain: Secondary | ICD-10-CM

## 2018-12-25 DIAGNOSIS — I480 Paroxysmal atrial fibrillation: Secondary | ICD-10-CM | POA: Diagnosis not present

## 2018-12-25 MED ORDER — DIAZEPAM 5 MG PO TABS: ORAL_TABLET | ORAL | 0 refills | Status: DC

## 2018-12-25 NOTE — Patient Instructions (Addendum)
Medication Instructions:  Your provider recommends that you continue on your current medications as directed. Please refer to the Current Medication list given to you today.    Labwork: None  Testing/Procedures: Dr. Radford Pax recommends you have a CORONARY CT.  Follow-Up: Your provider recommends that you schedule a follow-up appointment AS NEEDED pending study results.  Any Other Special Instructions Will Be Listed Below (If Applicable).  CORONARY CT INSTRUCTIONS:  Please arrive at the [facility] main entrance of [facility] at xx:xx AM (30-45 minutes prior to test start time). (You will be called to confirm date and time after we talk with your insurance)  [facility] [address] [place], [place] [address] [phone]  Proceed to the [facility] Radiology Department (First Floor).  Please follow these instructions carefully (unless otherwise directed):  On the Night Before the Test: . Be sure to Drink plenty of water. . Do not consume any caffeinated/decaffeinated beverages or chocolate 12 hours prior to your test. . Do not take any antihistamines 12 hours prior to your test.   On the Day of the Test: . Drink plenty of water. Do not drink any water within one hour of the test. . Do not eat any food 4 hours prior to the test. . You may take your regular medications prior to the test.        After the Test: . Drink plenty of water. . After receiving IV contrast, you may experience a mild flushed feeling. This is normal. . On occasion, you may experience a mild rash up to 24 hours after the test. This is not dangerous. If this occurs, you can take Benadryl 25 mg and increase your fluid intake. . If you experience trouble breathing, this can be serious. If it is severe call 911 IMMEDIATELY. If it is mild, please call our office.

## 2018-12-25 NOTE — Addendum Note (Signed)
Addended by: Harland German A on: 12/25/2018 01:48 PM   Modules accepted: Orders

## 2018-12-25 NOTE — Progress Notes (Signed)
Cardiology Office Note:    Date:  12/25/2018   ID:  Kimberly [name], DOB [birth year], MRN [id number]  PCP:  Lajean Manes, MD  Cardiologist:  Fransico Him, MD    Referring MD: Lajean Manes, MD   Chief Complaint  Patient presents with  . Chest Pain    History of Present Illness:    Kimberly [name] is a 78 y.o. female with a hx of PAF on metoprolol and Eliquis for chads vas score of 5. Metoprolol decreased by [facility] because of bradycardia.She also has a historyof chest pain with normal nuclear stress testandsyncope felt secondary to dehydration. She has a chronic RBBB with no ischemia on stress test a year.  She is here today with complaints of chest pain.  She says for the past few months she has had a tightness intermittently in the midsternal portion of her chest.  SHe says it is not what she gets with her PAF and it is exertional.  There is no nausea, diaphoresis or SOB.  It resolves with rest.  She works a lot in the yard and will get it while working in her garden.  Her last nuclear stress test was a year ago and was normal.   Past Medical History:  Diagnosis Date  . Allergy   . Anal polyp 1998   Flex Sig   . Anemia   . Angular blepharitis of left eye   . Ankle fracture    Stress fracture  . Anxiety   . Asthma    border line has inhaler  . Bunion   . Cataracts, bilateral   . Corneal scar    left eye  . Degenerative disc disease   . Depression   . Diverticulosis of colon (without mention of hemorrhage) 2010   Colonoscopy  . Dry eyes    bilateral  . Enterocele   . External hemorrhoids 2000   Colonoscopy  . Family history of malignant neoplasm of gastrointestinal tract   . Female cystocele   . Fibromyalgia   . GERD (gastroesophageal reflux disease)   . Hiatal hernia 2005,2010   EGD  . History of bronchitis   . History of measles   . History of mumps   . History of strep sore throat   . History of urinary tract infection   . Hyperlipemia   .  Hypothyroidism   . IBS (irritable bowel syndrome)   . Imbalance   . Internal hemorrhoids without mention of complication 4098,1191   Colonoscopy   . Itching   . 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 Menopause   . Migraine   . OSA (obstructive sleep apnea) 05/14/2015   on BiPAP  . Osteopenia 10/2013   T score -1.6 FRAX 10%/1.6%  . PAF (paroxysmal atrial fibrillation) ([place])   . Pancreatitis   . PCO (posterior capsular opacification)    left  . Pneumonia    childhood illness  . PONV (postoperative nausea and vomiting)   . Pseudophakia, both eyes   . PVD (posterior vitreous detachment) right  . Rash    on back   . Retinal scar    left  . Rotator cuff disorder    pain, left shoulder  . Shingles   . Stricture and stenosis of esophagus 2005,2010   EGD   . Stroke ([place])   . Ulcerative colitis ([place])   . Varicose veins   . Vertigo   . Wears glasses     Past Surgical History:  Procedure  Laterality Date  . ABDOMINAL HYSTERECTOMY    . ANAL FISSURE REPAIR    . BREAST EXCISIONAL BIOPSY Left   . BUNIONECTOMY  2014  . CATARACT EXTRACTION Bilateral 2013  . COLONOSCOPY     polyp removed  . ESOPHAGEAL DILATION    . EYE SURGERY Left   . FOOT SURGERY     left   . HEMORRHOID SURGERY    . MOUTH SURGERY  11/13/11   Cyst removed from gum-benign   . NASAL SEPTUM SURGERY    . NASAL SEPTUM SURGERY  1970's  . REPLACEMENT TOTAL KNEE Right 2011  . TONSILLECTOMY    . TOTAL ABDOMINAL HYSTERECTOMY W/ BILATERAL SALPINGOOPHORECTOMY  1991   TAH BSO  . TOTAL HIP ARTHROPLASTY     right   . TOTAL KNEE ARTHROPLASTY     both  . TOTAL KNEE ARTHROPLASTY Left 06/23/2015   Procedure: LEFT TOTAL KNEE ARTHROPLASTY;  Surgeon: Gaynelle Arabian, MD;  Location: [facility];  Service: Orthopedics;  Laterality: Left;    Current Medications: Current Meds  Medication Sig  . acetaminophen (TYLENOL) 500 MG tablet Take 500 mg by mouth every 6 (six) hours as needed for moderate pain.   . Calcium Carbonate-Vitamin D (CALTRATE  600+D) 600-400 MG-UNIT tablet Take 1 tablet by mouth 2 (two) times daily.  Marland Kitchen diltiazem (CARDIZEM CD) 120 MG 24 hr capsule TAKE 1 CAPSULE (120 MG TOTAL) BY MOUTH AT BEDTIME.  Marland Kitchen ELIQUIS 5 MG TABS tablet TAKE 1 TABLET BY MOUTH TWICE A DAY  . EPINEPHrine 0.3 mg/0.3 mL IJ SOAJ injection Inject 0.3 mg into the muscle as needed (for allergic reaction). Reported on 05/28/2015   . fluticasone (FLONASE) 50 MCG/ACT nasal spray Place 2 sprays into both nostrils every other day.   . levothyroxine (SYNTHROID) 100 MCG tablet Take 100 mcg by mouth daily before breakfast.  . montelukast (SINGULAIR) 10 MG tablet every evening.  . NON FORMULARY [facility]  ANTIFUNGAL (NAIL)- #1  . ondansetron (ZOFRAN-ODT) 4 MG disintegrating tablet Take 1 tablet (4 mg total) by mouth every 8 (eight) hours as needed for nausea or vomiting.  . Polyethylene Glycol 3350 (MIRALAX PO) Dissolve 9 grams in at least 8 ounces water/juice and drink once daily as needed for constipation.  . Polyvinyl Alcohol-Povidone (REFRESH OP) Apply to eye as needed (Dry eyes).  Marland Kitchen PROAIR HFA 108 (90 Base) MCG/ACT inhaler TAKE 2 PUFFS AS NEEDED EVERY 4-6 HOURS AS NEEDED COUGH/WHEEZE INHALATION 90  . Probiotic Product (ALIGN) 4 MG CAPS Take 1 capsule by mouth daily.  . RESTASIS 0.05 % ophthalmic emulsion Place 1 drop into both eyes 2 (two) times daily. Uses as directed  . rosuvastatin (CRESTOR) 10 MG tablet TAKE ONE HALF TABLET ONCE A DAY AT BEDTIME ORALLY 90 DAY(S)     Allergies:   Azithromycin, Erythromycin, Almond oil, Amoxicillin-pot clavulanate, Atorvastatin, Codeine, Ketoconazole, Ketoconazole, Latex, Lincomycin, Morphine, Morphine sulfate, Other, Shellfish allergy, Simvastatin, Sulfamethoxazole-trimethoprim, Clindamycin, Clindamycin/lincomycin, and Erythromycin base   Social History   Socioeconomic History  . Marital status: Married    Spouse name: Not on file  . Number of children: 1  . Years of education: bus.school  . Highest  education level: Not on file  Occupational History  . Occupation: Retired    Fish farm manager: RETIRED  Social Needs  . Financial resource strain: Not on file  . Food insecurity    Worry: Not on file    Inability: Not on file  . Transportation needs    Medical: Not on file  Non-medical: Not on file  Tobacco Use  . Smoking status: Former Smoker    Packs/day: 0.25    Years: 15.00    Pack years: 3.75    Types: Cigarettes    Quit date: 06/01/1975    Years since quitting: 43.5  . Smokeless tobacco: Never Used  . Tobacco comment: Stopped smoking age 75  Substance and Sexual Activity  . Alcohol use: No    Alcohol/week: 0.0 standard drinks  . Drug use: No  . Sexual activity: Never    Birth control/protection: Surgical    Comment: HYST, intecourse age unknown, sexual partner less than 5  Lifestyle  . Physical activity    Days per week: Not on file    Minutes per session: Not on file  . Stress: Not on file  Relationships  . Social Herbalist on phone: Not on file    Gets together: Not on file    Attends religious service: Not on file    Active member of club or organization: Not on file    Attends meetings of clubs or organizations: Not on file    Relationship status: Not on file  Other Topics Concern  . Not on file  Social History Narrative   Daily caffeine      Family History: The patient's family history includes Alcohol abuse in her father; Allergies in her sister; Allergy (severe) in her sister; Alzheimer's disease in her mother; Arthritis in her brother; Breast cancer (age of onset: 86) in her sister; Colon cancer in her paternal aunt; Colon polyps in her brother and sister; Congestive Heart Failure in her father; Dementia in her mother; Diabetes in her sister; Heart attack in her maternal grandfather, maternal grandmother, and paternal grandfather; Heart disease in her maternal grandmother; Hypertension in her father; Irritable bowel syndrome in her mother; Other in  her sister and sister; Stroke in her mother. There is no history of Esophageal cancer, Rectal cancer, or Stomach cancer.  ROS:   Please see the history of present illness.    ROS  All other systems reviewed and negative.   EKGs/Labs/Other Studies Reviewed:    The following studies were reviewed today: none  EKG:  EKG is  ordered today.  The ekg ordered today demonstrates NSR with RBBB  Recent Labs: 12/04/2018: ALT 15; BUN 16; Creatinine, Ser 0.67; Hemoglobin 12.7; Platelets 185; Potassium 3.4; Sodium 136   Recent Lipid Panel    Component Value Date/Time   CHOL 124 07/23/2013 0632   TRIG 69 07/23/2013 0632   HDL 68 07/23/2013 0632   CHOLHDL 1.8 07/23/2013 0632   VLDL 14 07/23/2013 0632   LDLCALC 42 07/23/2013 1601    Physical Exam:    VS:  BP 122/82   Pulse 61   Ht 5\' 7"  (1.702 m)   Wt 181 lb (82.1 kg)   LMP  (LMP Unknown)   SpO2 98%   BMI 28.35 kg/m     Wt Readings from Last 3 Encounters:  12/25/18 181 lb (82.1 kg)  12/04/18 184 lb (83.5 kg)  10/24/18 182 lb (82.6 kg)     GEN:  Well nourished, well developed in no acute distress HEENT: Normal NECK: No JVD; No carotid bruits LYMPHATICS: No lymphadenopathy CARDIAC: RRR, no murmurs, rubs, gallops RESPIRATORY:  Clear to auscultation without rales, wheezing or rhonchi  ABDOMEN: Soft, non-tender, non-distended MUSCULOSKELETAL:  No edema; No deformity  SKIN: Warm and dry NEUROLOGIC:  Alert and oriented x 3 PSYCHIATRIC:  Normal affect  ASSESSMENT:    1. Chest pain of uncertain etiology    PLAN:    In order of problems listed above:  1. Paroxysmal atrial fibrillation -She is maintaining NSR on exam -no bleeding on DOAC -continue on Cardizem CD 120mg  daily and Eliquis 5mg  BID -creatinine was 0.67 on 12/04/2018 and Hbg 12.7  2.  Chest pain -chest pain is concerning as it is exertional and resolves with rest -EKG shows unchanged RBBB -nuclear stress test a year ago was normal -recommend coronary CTA with  FFR to evaluate for CAD  3.  Syncope -she has not had any further problems with dizziness or syncope and episode was felt to be due to dehydration.   Medication Adjustments/Labs and Tests Ordered: Current medicines are reviewed at length with the patient today.  Concerns regarding medicines are outlined above.  Orders Placed This Encounter  Procedures  . EKG 12-Lead   No orders of the defined types were placed in this encounter.   Signed, Fransico Him, MD  12/25/2018 1:33 PM    [facility]

## 2018-12-26 ENCOUNTER — Telehealth: Payer: Self-pay | Admitting: *Deleted

## 2018-12-26 NOTE — Telephone Encounter (Signed)
Called patient at the request of ct scheduler to review instructions. Pt wanted to be sure that she could take her usual evening and morning medications. CT is scheduled for tomorrow. Pt takes eliquis, rosuvastatin and diltiazem at night and synthroid and eliquis in morning. Adv she can take all those meds as directed. She does not need beta blocker. She has one valium tablet ordered by Dr. Radford Pax to take 1 hr prior to ct.  Pt had her AVS from visit with Dr. Radford Pax yesterday that she was reading ct instructions from.  She verbalizes understanding of all instructions.

## 2018-12-27 ENCOUNTER — Ambulatory Visit (HOSPITAL_COMMUNITY): Payer: Medicare Other

## 2018-12-27 ENCOUNTER — Ambulatory Visit (HOSPITAL_COMMUNITY)
Admission: RE | Admit: 2018-12-27 | Discharge: 2018-12-27 | Disposition: A | Discharge status: Home / Self Care | Payer: Medicare Other | Source: Ambulatory Visit | Attending: Cardiology | Admitting: Cardiology

## 2018-12-27 ENCOUNTER — Other Ambulatory Visit: Payer: Self-pay

## 2018-12-27 DIAGNOSIS — R0789 Other chest pain: Secondary | ICD-10-CM | POA: Insufficient documentation

## 2018-12-27 DIAGNOSIS — I700 Atherosclerosis of aorta: Secondary | ICD-10-CM | POA: Diagnosis not present

## 2018-12-27 DIAGNOSIS — R079 Chest pain, unspecified: Secondary | ICD-10-CM

## 2018-12-27 MED ORDER — METOPROLOL TARTRATE 5 MG/5ML IV SOLN
5.0000 mg | INTRAVENOUS | Status: DC | PRN
  Administered 2018-12-27: 5 mg via INTRAVENOUS
  Filled 2018-12-27: qty 5

## 2018-12-27 MED ORDER — IOHEXOL 350 MG/ML SOLN
80.0000 mL | Freq: Once | INTRAVENOUS | Status: AC | PRN
  Administered 2018-12-27: 80 mL via INTRAVENOUS

## 2018-12-27 MED ORDER — NITROGLYCERIN 0.4 MG SL SUBL
SUBLINGUAL_TABLET | SUBLINGUAL | Status: AC
  Filled 2018-12-27: qty 2

## 2018-12-27 MED ORDER — NITROGLYCERIN 0.4 MG SL SUBL
0.8000 mg | SUBLINGUAL_TABLET | Freq: Once | SUBLINGUAL | Status: AC
  Administered 2018-12-27: 0.8 mg via SUBLINGUAL
  Filled 2018-12-27: qty 25

## 2018-12-27 NOTE — Progress Notes (Signed)
Ct complete. Patient denies any complaints.

## 2018-12-28 ENCOUNTER — Telehealth: Payer: Self-pay | Admitting: Cardiology

## 2018-12-28 NOTE — Telephone Encounter (Signed)
New Message   Patient would like to speak to a nurse about CT results.

## 2018-12-28 NOTE — Telephone Encounter (Signed)
Pt had questions re the non cardiac portion of CT and reviewed with pt the radiologists findings IMPRESSION: No acute or clinically significant extracardiac findings within the visualized portion of the chest.Pt appreciative with call back ./cy

## 2019-01-15 ENCOUNTER — Other Ambulatory Visit: Payer: Self-pay

## 2019-01-15 ENCOUNTER — Ambulatory Visit (INDEPENDENT_AMBULATORY_CARE_PROVIDER_SITE_OTHER): Payer: Medicare Other | Admitting: Internal Medicine

## 2019-01-15 ENCOUNTER — Telehealth: Payer: Self-pay | Admitting: *Deleted

## 2019-01-15 ENCOUNTER — Encounter: Payer: Self-pay | Admitting: Internal Medicine

## 2019-01-15 VITALS — BP 130/80 | HR 72 | Temp 98.1°F | Ht 65.5 in | Wt 183.2 lb

## 2019-01-15 DIAGNOSIS — K581 Irritable bowel syndrome with constipation: Secondary | ICD-10-CM | POA: Diagnosis not present

## 2019-01-15 DIAGNOSIS — Z8371 Family history of colonic polyps: Secondary | ICD-10-CM | POA: Diagnosis not present

## 2019-01-15 DIAGNOSIS — K219 Gastro-esophageal reflux disease without esophagitis: Secondary | ICD-10-CM | POA: Diagnosis not present

## 2019-01-15 MED ORDER — SUPREP BOWEL PREP KIT 17.5-3.13-1.6 GM/177ML PO SOLN: 1.0000 | ORAL | 0 refills | Status: DC

## 2019-01-15 NOTE — Telephone Encounter (Signed)
Request for surgical clearance:     Endoscopy Procedure  What type of surgery is being performed?     colonoscopy  When is this surgery scheduled?     01/25/2019  What type of clearance is required ?   Pharmacy  Are there any medications that need to be held prior to surgery and how long? Eliquis, 2 days  Practice name and name of physician performing surgery?      [facility]  What is your office phone and fax number?      Phone- [phone]  [phone]  Anesthesia type (None, local, MAC, general) ?       MAC

## 2019-01-15 NOTE — Patient Instructions (Signed)
You have been scheduled for a colonoscopy. Please follow written instructions given to you at your visit today.  Please pick up your prep supplies at the pharmacy within the next 1-3 days. If you use inhalers (even only as needed), please bring them with you on the day of your procedure. Your physician has requested that you go to www.[email or web address] and enter the access code given to you at your visit today. This web site gives a general overview about your procedure. However, you should still follow specific instructions given to you by our office regarding your preparation for the procedure.  Please purchase the following medications over the counter and take as directed: Miralax 9-17 grams (1/2-1 capful) dissolved in at least 8 ounces water/juice daily.  Please discontinue pantoprazole.  If you are age 13 or older, your body mass index should be between 23-30. Your Body mass index is 30.03 kg/m. If this is out of the aforementioned range listed, please consider follow up with your Primary Care Provider.  If you are age 94 or younger, your body mass index should be between 19-25. Your Body mass index is 30.03 kg/m. If this is out of the aformentioned range listed, please consider follow up with your Primary Care Provider.

## 2019-01-16 ENCOUNTER — Telehealth: Payer: Self-pay | Admitting: Cardiology

## 2019-01-16 NOTE — Telephone Encounter (Signed)
Spoke with pt and made her aware that information was sent to GI office this morning and our recommendation was to hold Eliquis one day prior.  Pt verbalized understanding.

## 2019-01-16 NOTE — Telephone Encounter (Signed)
   Primary Cardiologist: Fransico Him, MD  Chart reviewed as part of pre-operative protocol coverage. Per pharmacy recommendations, patient can hold eliquis 1 day prior to his upcoming colonoscopy with plans to restart this medication as soon as he is cleared to do so by his gastroenterologist.   I will route this recommendation to the requesting party via [place] fax function and remove from pre-op pool.  Please call with questions.  Abigail Butts, PA-C 01/16/2019, 10:07 AM

## 2019-01-16 NOTE — Telephone Encounter (Signed)
New Message   Patient is calling in because she is having a colonoscopy on Wednesday 01/24/19, patient wants to know what she should do about the Eliquis that she is taking. Patient wants to know does she need to stop Eliquis for an amount of time or will she need to do a Lovenox Bridge. Please give patient a call back to discuss.

## 2019-01-16 NOTE — Progress Notes (Signed)
Subjective:    Patient ID: Kimberly [name], female    DOB: Dec 30, 1940, 78 y.o.   MRN: [id number]  HPI Kimberly [name] is a 78 year old female with a history of IBS with constipation, colonic diverticulosis, GERD with small hiatal hernia, atrial fibrillation on Eliquis, bladder prolapse, hypothyroidism who is seen for follow-up.  She is here alone today.  She was last seen in March 2019.  She reports that she has been under quite a bit of stress lately particularly as it relates to her husband's health care.  He has both thyroid cancer and prostate cancer.  He gets his oncology treatment at [facility] and locally.  She reports from a GI perspective she feels that she has been doing fairly well.  She reports that she had not been consistent with MiraLAX therapy which she was using in the past and so she has had issues with constipation and small pellet-like stools.  Occasionally she will have a normal bowel movement.  She has not seen blood in her stool or melena.  She has recently in the last several days started back on MiraLAX.  She is remained off the pantoprazole for several months maybe as long as a year.  She stopped this just to see if she really needed it.  She has not had recurrent heartburn or dysphagia.  No abdominal pain.  She did recently have a CT of her chest for coronary artery calcium scoring.  On the additional images obtained outside of the coronary vasculature there was some airway plugging in the bilateral lungs raising the question of aspiration or atypical infection.  Specifically to this she denies cough, fevers, dyspnea and chest pain.  She brings to light family history in both her sister and brother who have both had adenomatous colon polyps.  She also had an aunt with colon cancer and a first cousin with colon cancer.  This first cousin was the aunt with colon cancers child.  Her last colonoscopy was in 2013 but without polyps.  Review of Systems As per HPI, otherwise  negative  Current Medications, Allergies, Past Medical History, Past Surgical History, Family History and Social History were reviewed in [employer] record.     Objective:   Physical Exam BP 130/80 (BP Location: Left Arm, Patient Position: Sitting, Cuff Size: Normal)   Pulse 72   Temp 98.1 F (36.7 C)   Ht 5' 5.5" (1.664 m) Comment: height measured without shoes  Wt 183 lb 4 oz (83.1 kg)   LMP  (LMP Unknown)   BMI 30.03 kg/m  Gen: awake, alert, NAD HEENT: anicteric, op clear CV: RRR, no mrg Pulm: CTA b/l Abd: soft, NT/ND, +BS throughout Ext: no c/c/e Neuro: nonfocal  CBC    Component Value Date/Time   WBC 11.3 (H) 12/04/2018 1937   RBC 4.07 12/04/2018 1937   HGB 12.7 12/04/2018 1937   HCT 38.4 12/04/2018 1937   PLT 185 12/04/2018 1937   MCV 94.3 12/04/2018 1937   MCH 31.2 12/04/2018 1937   MCHC 33.1 12/04/2018 1937   RDW 12.4 12/04/2018 1937   LYMPHSABS 0.9 12/04/2018 1937   MONOABS 1.0 12/04/2018 1937   EOSABS 0.1 12/04/2018 1937   BASOSABS 0.0 12/04/2018 1937   CMP     Component Value Date/Time   NA 136 12/04/2018 1937   NA 144 04/19/2018 1356   K 3.4 (L) 12/04/2018 1937   CL 105 12/04/2018 1937   CO2 24 12/04/2018 1937   GLUCOSE 114 (H)  12/04/2018 1937   BUN 16 12/04/2018 1937   BUN 17 04/19/2018 1356   CREATININE 0.67 12/04/2018 1937   CALCIUM 8.8 (L) 12/04/2018 1937   PROT 6.8 12/04/2018 1937   ALBUMIN 4.0 12/04/2018 1937   AST 21 12/04/2018 1937   ALT 15 12/04/2018 1937   ALKPHOS 41 12/04/2018 1937   BILITOT 0.5 12/04/2018 1937   GFRNONAA >60 12/04/2018 1937   GFRAA >60 12/04/2018 1937      Assessment & Plan:  78 year old female with a history of IBS with constipation, colonic diverticulosis, GERD with small hiatal hernia, atrial fibrillation on Eliquis, bladder prolapse, hypothyroidism who is seen for follow-up.  1.  Irritable bowel with constipation --she has resumed MiraLAX which I reassured her is a very safe product  going forward.  Previously she was using half dose daily but she can use either a half dose or full dose of MiraLAX daily on an ongoing basis for her chronic constipation.  In the past this seemed to work well.  She inquired about fiber which she certainly can add with either Benefiber or Metamucil.  She can use either MiraLAX or fiber or both if needed for bowel regularity.  2.  GERD/possible LPR with history of throat clearing --she has been off PPI now for at least 6 months and has not had recurrent issues with frequent reflux or significant LPR symptoms.  For this reason I certainly feel it is okay to leave her off of pantoprazole therapy.  She can let me know if this becomes trouble in the future.  3.  Family history of colon polyps in multiple first-degree relatives/family history of colon cancer in more distant relatives --for these reasons, specifically her family history, I recommend that we repeat screening colonoscopy at this time.  Her last colonoscopy was 7 years ago.  We discussed the risk, benefits and alternatives to colonoscopy and she is agreeable and wishes to proceed.  We will need to hold her Eliquis prior to this procedure.  Will hold Eliquis 2 days prior to endoscopic procedures - will instruct when and how to resume after procedure. Benefits and risks of procedure explained including risks of bleeding, perforation, infection, missed lesions, reactions to medications and possible need for hospitalization and surgery for complications. Additional rare but real risk of stroke or other vascular clotting events off Eliquis also explained and need to seek urgent help if any signs of these problems occur. Will communicate by phone or EMR with patient's  prescribing provider to confirm that holding Eliquis is reasonable in this case.   25 minutes spent with the patient today. Greater than 50% was spent in counseling and coordination of care with the patient

## 2019-01-16 NOTE — Telephone Encounter (Signed)
Patient with diagnosis of afib on Eliquis for anticoagulation.    Procedure: colonoscopy Date of procedure: 01/25/2019  CHADS2-VASc score of  5 (AGE, stroke/tia x 2, AGE, female)  Due to pt history of stroke, recommend patient holding Eliquis for only 1 day prior to procedure.

## 2019-01-17 ENCOUNTER — Telehealth: Payer: Self-pay | Admitting: Internal Medicine

## 2019-01-17 NOTE — Telephone Encounter (Signed)
I have spoken to patient to advise that I have placed a note on Dr Vena Rua desk asking for advise regarding holding eliquis 1 day vs 2 days prior to procedure. I will be in touch with her tomorrow morning about this. I have also advised regarding fiber intake prior to appointment. She verbalizes understanding.

## 2019-01-18 NOTE — Telephone Encounter (Signed)
I have instructed patient that per Dr Hilarie Fredrickson, she may hold Eliquis starting the evening of 01/23/19 until her procedure at which time he will instruct her further. She verbalizes understanding.

## 2019-01-18 NOTE — Telephone Encounter (Signed)
Can take 01/23/2019 AM dose of Eliquis and then hold until procedure, I will determine when to resume based on procedure findings Thanks

## 2019-01-23 ENCOUNTER — Encounter: Payer: Self-pay | Admitting: Internal Medicine

## 2019-01-24 ENCOUNTER — Telehealth: Payer: Self-pay | Admitting: *Deleted

## 2019-01-24 NOTE — Telephone Encounter (Signed)
Covid-19 screening questions   Do you now or have you had a fever in the last 14 days no   Do you have any respiratory symptoms of shortness of breath or cough now or in the last 14 days no  Do you have any family members or close contacts with diagnosed or suspected Covid-19 in the past 14 days no  Have you been tested for Covid-19 and found to be positive no          

## 2019-01-25 ENCOUNTER — Encounter: Payer: Self-pay | Admitting: Internal Medicine

## 2019-01-25 ENCOUNTER — Other Ambulatory Visit: Payer: Self-pay

## 2019-01-25 ENCOUNTER — Ambulatory Visit (AMBULATORY_SURGERY_CENTER): Payer: Medicare Other | Admitting: Internal Medicine

## 2019-01-25 VITALS — BP 117/81 | HR 73 | Temp 97.7°F | Resp 16 | Ht 65.5 in | Wt 183.0 lb

## 2019-01-25 DIAGNOSIS — D120 Benign neoplasm of cecum: Secondary | ICD-10-CM

## 2019-01-25 DIAGNOSIS — Z86010 Personal history of colonic polyps: Secondary | ICD-10-CM

## 2019-01-25 DIAGNOSIS — D124 Benign neoplasm of descending colon: Secondary | ICD-10-CM

## 2019-01-25 DIAGNOSIS — D123 Benign neoplasm of transverse colon: Secondary | ICD-10-CM

## 2019-01-25 DIAGNOSIS — Z8371 Family history of colonic polyps: Secondary | ICD-10-CM

## 2019-01-25 DIAGNOSIS — D122 Benign neoplasm of ascending colon: Secondary | ICD-10-CM

## 2019-01-25 MED ORDER — SODIUM CHLORIDE 0.9 % IV SOLN: 500.0000 mL | Freq: Once | INTRAVENOUS | Status: DC

## 2019-01-25 NOTE — Op Note (Signed)
[facility] Patient Name: Kimberly [name] Procedure Date: 01/25/2019 8:49 AM MRN: [id number] Endoscopist: Jerene Bears , MD Age: 78 Referring MD:  Date of Birth: Feb 12, 1941 Gender: Female Account #: [id number] Procedure:                Colonoscopy Indications:              Colon cancer screening in patient at increased                            risk: Family history of colon polyps in multiple                            1st-degree relatives Medicines:                Monitored Anesthesia Care Procedure:                Pre-Anesthesia Assessment:                           - Prior to the procedure, a History and Physical                            was performed, and patient medications and                            allergies were reviewed. The patient's tolerance of                            previous anesthesia was also reviewed. The risks                            and benefits of the procedure and the sedation                            options and risks were discussed with the patient.                            All questions were answered, and informed consent                            was obtained. Prior Anticoagulants: The patient has                            taken Eliquis (apixaban), last dose was 2 days                            prior to procedure. ASA Grade Assessment: III - A                            patient with severe systemic disease. After                            reviewing the risks and benefits, the patient was  deemed in satisfactory condition to undergo the                            procedure.                           After obtaining informed consent, the colonoscope                            was passed under direct vision. Throughout the                            procedure, the patient's blood pressure, pulse, and                            oxygen saturations were monitored continuously. The   Colonoscope was introduced through the anus and                            advanced to the cecum, identified by appendiceal                            orifice and ileocecal valve. The colonoscopy was                            performed without difficulty. The patient tolerated                            the procedure well. The quality of the bowel                            preparation was good. The ileocecal valve,                            appendiceal orifice, and rectum were photographed. Scope In: 8:51:05 AM Scope Out: 9:11:47 AM Scope Withdrawal Time: 0 hours 14 minutes 47 seconds  Total Procedure Duration: 0 hours 20 minutes 42 seconds  Findings:                 The digital rectal exam was normal.                           Two sessile polyps were found in the cecum. The                            polyps were 3 to 4 mm in size. These polyps were                            removed with a cold snare. Resection and retrieval                            were complete.                           Two sessile polyps were found in the  ascending                            colon. The polyps were 2 to 3 mm in size. These                            polyps were removed with a cold snare. Resection                            and retrieval were complete.                           Two sessile polyps were found in the transverse                            colon. The polyps were 4 to 5 mm in size. These                            polyps were removed with a cold snare. Resection                            and retrieval were complete.                           Two sessile polyps were found in the descending                            colon. The polyps were 3 to 5 mm in size. These                            polyps were removed with a cold snare. Resection                            and retrieval were complete.                           External and internal hemorrhoids were found during                             retroflexion and during perianal exam. The                            hemorrhoids were small. Complications:            No immediate complications. Estimated Blood Loss:     Estimated blood loss was minimal. Impression:               - Two 3 to 4 mm polyps in the cecum, removed with a                            cold snare. Resected and retrieved.                           - Two 2 to 3 mm polyps in the  ascending colon,                            removed with a cold snare. Resected and retrieved.                           - Two 4 to 5 mm polyps in the transverse colon,                            removed with a cold snare. Resected and retrieved.                           - Two 3 to 5 mm polyps in the descending colon,                            removed with a cold snare. Resected and retrieved.                           - External and internal hemorrhoids. Recommendation:           - Patient has a contact number available for                            emergencies. The signs and symptoms of potential                            delayed complications were discussed with the                            patient. Return to normal activities tomorrow.                            Written discharge instructions were provided to the                            patient.                           - Resume previous diet.                           - Continue present medications.                           - Await pathology results.                           - No recommendation at this time regarding repeat                            colonoscopy due to age at next                            screening/surveillance interval. Jerene Bears, MD 01/25/2019 9:22:13 AM This report has been signed electronically.

## 2019-01-25 NOTE — Progress Notes (Signed)
Called to room to assist during endoscopic procedure.  Patient ID and intended procedure confirmed with present staff. Received instructions for my participation in the procedure from the performing physician.  

## 2019-01-25 NOTE — Progress Notes (Signed)
Pt's states no medical or surgical changes since previsit or office visit. 

## 2019-01-25 NOTE — Progress Notes (Signed)
Temperature taken by C.W., VS taken by N.C.

## 2019-01-25 NOTE — Progress Notes (Signed)
Report given to PACU, vss 

## 2019-01-25 NOTE — Patient Instructions (Signed)
   RESTART YOUR ELIQUIS  TOMORROW AT YOUR NORMAL DOSAGE.  HANDOUTS GIVEN : POLYPS, AND HEMORRHOIDS YOU HAD AN ENDOSCOPIC PROCEDURE TODAY AT [facility]:   Refer to the procedure report that was given to you for any specific questions about what was found during the examination.  If the procedure report does not answer your questions, please call your gastroenterologist to clarify.  If you requested that your care partner not be given the details of your procedure findings, then the procedure report has been included in a sealed envelope for you to review at your convenience later.  YOU SHOULD EXPECT: Some feelings of bloating in the abdomen. Passage of more gas than usual.  Walking can help get rid of the air that was put into your GI tract during the procedure and reduce the bloating. If you had a lower endoscopy (such as a colonoscopy or flexible sigmoidoscopy) you may notice spotting of blood in your stool or on the toilet paper. If you underwent a bowel prep for your procedure, you may not have a normal bowel movement for a few days.  Please Note:  You might notice some irritation and congestion in your nose or some drainage.  This is from the oxygen used during your procedure.  There is no need for concern and it should clear up in a day or so.  SYMPTOMS TO REPORT IMMEDIATELY:   Following lower endoscopy (colonoscopy or flexible sigmoidoscopy):  Excessive amounts of blood in the stool  Significant tenderness or worsening of abdominal pains  Swelling of the abdomen that is new, acute  Fever of 100F or higher   For urgent or emergent issues, a gastroenterologist can be reached at any hour by calling [phone].   DIET:  We do recommend a small meal at first, but then you may proceed to your regular diet.  Drink plenty of fluids but you should avoid alcoholic beverages for 24 hours.  ACTIVITY:  You should plan to take it easy for the rest of today and you should NOT  DRIVE or use heavy machinery until tomorrow (because of the sedation medicines used during the test).    FOLLOW UP: Our staff will call the number listed on your records 48-72 hours following your procedure to check on you and address any questions or concerns that you may have regarding the information given to you following your procedure. If we do not reach you, we will leave a message.  We will attempt to reach you two times.  During this call, we will ask if you have developed any symptoms of COVID 19. If you develop any symptoms (ie: fever, flu-like symptoms, shortness of breath, cough etc.) before then, please call [phone].  If you test positive for Covid 19 in the 2 weeks post procedure, please call and report this information to [place].    If any biopsies were taken you will be contacted by phone or by letter within the next 1-3 weeks.  Please call us at [phone] if you have not heard about the biopsies in 3 weeks.    SIGNATURES/CONFIDENTIALITY: You and/or your care partner have signed paperwork which will be entered into your electronic medical record.  These signatures attest to the fact that that the information above on your After Visit Summary has been reviewed and is understood.  Full responsibility of the confidentiality of this discharge information lies with you and/or your care-partner.

## 2019-01-28 ENCOUNTER — Emergency Department (HOSPITAL_COMMUNITY): Payer: Medicare Other

## 2019-01-28 ENCOUNTER — Observation Stay (HOSPITAL_BASED_OUTPATIENT_CLINIC_OR_DEPARTMENT_OTHER): Payer: Medicare Other

## 2019-01-28 ENCOUNTER — Encounter (HOSPITAL_COMMUNITY): Payer: Self-pay | Admitting: Emergency Medicine

## 2019-01-28 ENCOUNTER — Other Ambulatory Visit: Payer: Self-pay

## 2019-01-28 ENCOUNTER — Observation Stay (HOSPITAL_COMMUNITY)
Admission: EM | Admit: 2019-01-28 | Discharge: 2019-01-28 | Disposition: A | Discharge status: Home / Self Care | Payer: Medicare Other | Attending: Internal Medicine | Admitting: Internal Medicine

## 2019-01-28 DIAGNOSIS — Z8673 Personal history of transient ischemic attack (TIA), and cerebral infarction without residual deficits: Secondary | ICD-10-CM | POA: Diagnosis not present

## 2019-01-28 DIAGNOSIS — I480 Paroxysmal atrial fibrillation: Secondary | ICD-10-CM | POA: Diagnosis present

## 2019-01-28 DIAGNOSIS — J45909 Unspecified asthma, uncomplicated: Secondary | ICD-10-CM | POA: Insufficient documentation

## 2019-01-28 DIAGNOSIS — Z7901 Long term (current) use of anticoagulants: Secondary | ICD-10-CM | POA: Insufficient documentation

## 2019-01-28 DIAGNOSIS — F329 Major depressive disorder, single episode, unspecified: Secondary | ICD-10-CM | POA: Insufficient documentation

## 2019-01-28 DIAGNOSIS — E785 Hyperlipidemia, unspecified: Secondary | ICD-10-CM | POA: Diagnosis not present

## 2019-01-28 DIAGNOSIS — Z20828 Contact with and (suspected) exposure to other viral communicable diseases: Secondary | ICD-10-CM | POA: Diagnosis not present

## 2019-01-28 DIAGNOSIS — Z79899 Other long term (current) drug therapy: Secondary | ICD-10-CM | POA: Insufficient documentation

## 2019-01-28 DIAGNOSIS — Z882 Allergy status to sulfonamides status: Secondary | ICD-10-CM | POA: Diagnosis not present

## 2019-01-28 DIAGNOSIS — F419 Anxiety disorder, unspecified: Secondary | ICD-10-CM | POA: Diagnosis not present

## 2019-01-28 DIAGNOSIS — Z91013 Allergy to seafood: Secondary | ICD-10-CM | POA: Diagnosis not present

## 2019-01-28 DIAGNOSIS — F411 Generalized anxiety disorder: Secondary | ICD-10-CM

## 2019-01-28 DIAGNOSIS — K219 Gastro-esophageal reflux disease without esophagitis: Secondary | ICD-10-CM | POA: Insufficient documentation

## 2019-01-28 DIAGNOSIS — R079 Chest pain, unspecified: Secondary | ICD-10-CM

## 2019-01-28 DIAGNOSIS — E039 Hypothyroidism, unspecified: Secondary | ICD-10-CM | POA: Diagnosis not present

## 2019-01-28 DIAGNOSIS — I4510 Unspecified right bundle-branch block: Secondary | ICD-10-CM | POA: Diagnosis not present

## 2019-01-28 DIAGNOSIS — Z79890 Hormone replacement therapy (postmenopausal): Secondary | ICD-10-CM | POA: Insufficient documentation

## 2019-01-28 DIAGNOSIS — G4733 Obstructive sleep apnea (adult) (pediatric): Secondary | ICD-10-CM | POA: Diagnosis not present

## 2019-01-28 DIAGNOSIS — K449 Diaphragmatic hernia without obstruction or gangrene: Secondary | ICD-10-CM | POA: Insufficient documentation

## 2019-01-28 DIAGNOSIS — R0789 Other chest pain: Secondary | ICD-10-CM

## 2019-01-28 DIAGNOSIS — Z885 Allergy status to narcotic agent status: Secondary | ICD-10-CM | POA: Insufficient documentation

## 2019-01-28 DIAGNOSIS — Z881 Allergy status to other antibiotic agents status: Secondary | ICD-10-CM | POA: Diagnosis not present

## 2019-01-28 LAB — CBC
HCT: 38.5 % (ref 36.0–46.0)
Hemoglobin: 12.7 g/dL (ref 12.0–15.0)
MCH: 31.6 pg (ref 26.0–34.0)
MCHC: 33 g/dL (ref 30.0–36.0)
MCV: 95.8 fL (ref 80.0–100.0)
Platelets: 210 10*3/uL (ref 150–400)
RBC: 4.02 MIL/uL (ref 3.87–5.11)
RDW: 12.3 % (ref 11.5–15.5)
WBC: 6.3 10*3/uL (ref 4.0–10.5)
nRBC: 0 % (ref 0.0–0.2)

## 2019-01-28 LAB — BASIC METABOLIC PANEL
Anion gap: 12 (ref 5–15)
BUN: 15 mg/dL (ref 8–23)
CO2: 25 mmol/L (ref 22–32)
Calcium: 9.4 mg/dL (ref 8.9–10.3)
Chloride: 101 mmol/L (ref 98–111)
Creatinine, Ser: 0.68 mg/dL (ref 0.44–1.00)
GFR calc Af Amer: >60 mL/min (ref >60)
GFR calc non Af Amer: >60 mL/min (ref >60)
Glucose, Bld: 105 mg/dL — ABNORMAL HIGH (ref 70–99)
Potassium: 3.5 mmol/L (ref 3.5–5.1)
Sodium: 138 mmol/L (ref 135–145)

## 2019-01-28 LAB — TROPONIN I (HIGH SENSITIVITY)
Troponin I (High Sensitivity): 11 ng/L (ref <18)
Troponin I (High Sensitivity): 51 ng/L — ABNORMAL HIGH (ref <18)
Troponin I (High Sensitivity): 121 ng/L (ref <18)
Troponin I (High Sensitivity): 135 ng/L (ref <18)

## 2019-01-28 LAB — ECHOCARDIOGRAM COMPLETE
Height: 66 in
Weight: 2848 oz

## 2019-01-28 LAB — SARS CORONAVIRUS 2 (TAT 6-24 HRS): SARS Coronavirus 2: NEGATIVE

## 2019-01-28 MED ORDER — ACETAMINOPHEN 325 MG PO TABS: 650.0000 mg | ORAL_TABLET | ORAL | Status: DC | PRN

## 2019-01-28 MED ORDER — SODIUM CHLORIDE 0.9% FLUSH
3.0000 mL | Freq: Once | INTRAVENOUS | Status: AC
  Administered 2019-01-28: 3 mL via INTRAVENOUS

## 2019-01-28 MED ORDER — ONDANSETRON HCL 4 MG/2ML IJ SOLN: 4.0000 mg | Freq: Four times a day (QID) | INTRAMUSCULAR | Status: DC | PRN

## 2019-01-28 MED ORDER — PANTOPRAZOLE SODIUM 40 MG PO TBEC: 40.0000 mg | DELAYED_RELEASE_TABLET | Freq: Every day | ORAL | 0 refills | Status: DC

## 2019-01-28 MED ORDER — SODIUM CHLORIDE 0.9 % IV SOLN
INTRAVENOUS | Status: DC
  Administered 2019-01-28: 06:00:00 via INTRAVENOUS

## 2019-01-28 NOTE — ED Triage Notes (Signed)
Patient with chest pain that started at 2300, was hypertensive with the pain.  No nausea or vomiting, but does have some anxiety and shortness of breath.  She did have a colonoscopy done yesterday.  She was given one SL nitro with decrease in pain and decrease BP per EMS.  Pain level is 2/10.

## 2019-01-28 NOTE — ED Provider Notes (Signed)
[facility] EMERGENCY DEPARTMENT Provider Note   CSN: [id number] Arrival date & time: 01/28/19  0034     History   Chief Complaint Chief Complaint  Patient presents with  . Chest Pain    HPI Kimberly [name] is a 78 y.o. female.     Patient with a history of atrial fibrillation on Eliquis, OSA, presents with shortness of breath and chest pressure that started around 11:00 last evening (01/27/19) after going to bed. She sleeps at an incline with a CPAP machine and notes that she started using new head gear and mask and it may not have been working properly. She became SOB with bilateral chest tightness that was better when she sat up on the edge of the bed. No cough, fever, congestion recently. She denies COVID exposure. No nausea, vomiting or diaphoresis. She had a colonoscopy 01/25/19 and stopped her Eliquis one day before, resuming regular dosing on 01/27/19. She denies any visualized rectal bleeding or abdominal pain. Per the patient, EMS reported to her that her blood pressure was extremely high, "200 over something".   The history is provided by the patient. No language interpreter was used.  Chest Pain Associated symptoms: shortness of breath   Associated symptoms: no abdominal pain, no diaphoresis, no fever and no nausea     Past Medical History:  Diagnosis Date  . Allergy   . Anal polyp 1998   Flex Sig   . Anemia   . Angular blepharitis of left eye   . Ankle fracture    Stress fracture  . Anxiety   . Asthma    border line has inhaler  . Bunion   . Cataracts, bilateral   . Corneal scar    left eye  . Degenerative disc disease   . Depression   . Diverticulosis of colon (without mention of hemorrhage) 2010   Colonoscopy  . Dry eyes    bilateral  . Enterocele   . External hemorrhoids 2000   Colonoscopy  . Family history of malignant neoplasm of gastrointestinal tract   . Female cystocele   . Fibromyalgia   . GERD (gastroesophageal reflux  disease)   . Hiatal hernia 2005,2010   EGD  . History of bronchitis   . History of measles   . History of mumps   . History of strep sore throat   . History of urinary tract infection   . Hyperlipemia   . Hypothyroidism   . IBS (irritable bowel syndrome)   . Imbalance   . Internal hemorrhoids without mention of complication 0000000   Colonoscopy   . Itching   . 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 Menopause   . Migraine   . OSA (obstructive sleep apnea) 05/14/2015   on BiPAP  . Osteopenia 10/2013   T score -1.6 FRAX 10%/1.6%  . PAF (paroxysmal atrial fibrillation) ([place])   . Pancreatitis   . PCO (posterior capsular opacification)    left  . Pneumonia    childhood illness  . PONV (postoperative nausea and vomiting)   . Pseudophakia, both eyes   . PVD (posterior vitreous detachment) right  . Rash    on back   . Retinal scar    left  . Rotator cuff disorder    pain, left shoulder  . Shingles   . Stricture and stenosis of esophagus 2005,2010   EGD   . Stroke ([place])   . Ulcerative colitis ([place])   . Varicose veins   . Vertigo   .  Wears glasses     Patient Active Problem List   Diagnosis Date Noted  . Chest pain of uncertain etiology 123XX123  . Adiposity 07/13/2018  . Excess weight 07/13/2018  . Hay fever 07/13/2018  . PAF (paroxysmal atrial fibrillation) ([place]) 08/25/2017  . Syncope 05/13/2016  . Heart palpitations 05/13/2016  . Rhinitis, chronic 02/04/2016  . IBS (irritable bowel syndrome) 07/14/2015  . Nausea without vomiting 07/14/2015  . OA (osteoarthritis) of knee 06/23/2015  . OSA (obstructive sleep apnea) 05/14/2015  . Preoperative clearance 01/08/2015  . Acute infection of nasal sinus 02/25/2014  . Pancreatitis 07/22/2013  . Leukocytosis, unspecified 07/22/2013  . Amblyopia 03/07/2013  . Retinal scar 03/07/2013  . Cellulitis 01/15/2013  . 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 Angular blepharitis 10/11/2012  . Dry eyes 10/11/2012  . PCO  (posterior capsular opacification) 10/11/2012  . PVD (posterior vitreous detachment) 10/11/2012  . 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 Cataracts, bilateral   . Elevated cholesterol   . Degenerative disc disease   . Bunion   . Ankle fracture   . 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 Hemorrhoid   . DUB (dysfunctional uterine bleeding)   . DIARRHEA 09/16/2009  . PERSONAL HX COLONIC POLYPS 09/16/2009  . LACUNAR INFARCTION 07/16/2008  . CONSTIPATION 07/12/2008  . CHEST PAIN 07/12/2008  . DYSPHAGIA 07/12/2008  . HYPOTHYROIDISM 07/10/2008  . ANXIETY 07/10/2008  . DEPRESSION 07/10/2008  . INTERNAL HEMORRHOIDS 07/10/2008  . ESOPHAGEAL STRICTURE 07/10/2008  . GERD 07/10/2008  . HIATAL HERNIA 07/10/2008  . DIVERTICULOSIS, COLON 07/10/2008  . IRRITABLE BOWEL SYNDROME 07/10/2008  . Osteoarthritis 07/10/2008  . FIBROMYALGIA 07/10/2008  . Blues 07/10/2008    Past Surgical History:  Procedure Laterality Date  . ABDOMINAL HYSTERECTOMY    . ANAL FISSURE REPAIR    . BREAST EXCISIONAL BIOPSY Left   . BUNIONECTOMY  2014  . CATARACT EXTRACTION Bilateral 2013  . COLONOSCOPY     polyp removed  . ESOPHAGEAL DILATION    . EYE SURGERY Left   . FOOT SURGERY     left   . HEMORRHOID SURGERY    . MOUTH SURGERY  11/13/11   Cyst removed from gum-benign   . NASAL SEPTUM SURGERY    . NASAL SEPTUM SURGERY  1970's  . REPLACEMENT TOTAL KNEE Right 2011  . TONSILLECTOMY    . TOTAL ABDOMINAL HYSTERECTOMY W/ BILATERAL SALPINGOOPHORECTOMY  1991   TAH BSO  . TOTAL HIP ARTHROPLASTY     right   . TOTAL KNEE ARTHROPLASTY     both  . TOTAL KNEE ARTHROPLASTY Left 06/23/2015   Procedure: LEFT TOTAL KNEE ARTHROPLASTY;  Surgeon: Gaynelle Arabian, MD;  Location: [facility];  Service: Orthopedics;  Laterality: Left;     OB History    Gravida  0   Para      Term      Preterm      AB      Living        SAB      TAB      Ectopic      Multiple      Live Births               Home Medications    Prior to  Admission medications   Medication Sig Start Date End Date Taking? Authorizing Provider  acetaminophen (TYLENOL) 500 MG tablet Take 500 mg by mouth every 6 (six) hours as needed for moderate pain.     [provider]  Calcium  Carbonate-Vitamin D (CALTRATE 600+D) 600-400 MG-UNIT tablet Take 1 tablet by mouth 2 (two) times daily.    [provider]  diazepam (VALIUM) 5 MG tablet Take Valium 5 mg 1 hour prior to your cardiac CT scan. You must have someone to drive you to/from CT. Patient not taking: Reported on 01/25/2019 12/25/18   Sueanne Margarita, MD  diltiazem (CARDIZEM CD) 120 MG 24 hr capsule TAKE 1 CAPSULE (120 MG TOTAL) BY MOUTH AT BEDTIME. 08/14/18 08/14/19  Sherran Needs, NP  ELIQUIS 5 MG TABS tablet TAKE 1 TABLET BY MOUTH TWICE A DAY 05/08/18   Sherran Needs, NP  EPINEPHrine 0.3 mg/0.3 mL IJ SOAJ injection Inject 0.3 mg into the muscle as needed (for allergic reaction). Reported on 05/28/2015     [provider]  fluticasone (FLONASE) 50 MCG/ACT nasal spray Place 2 sprays into both nostrils every other day.  06/06/13   [provider]  levothyroxine (SYNTHROID) 100 MCG tablet Take 100 mcg by mouth daily before breakfast.    [provider]  montelukast (SINGULAIR) 10 MG tablet every evening. 07/30/18   [provider]  NON FORMULARY [facility]  ANTIFUNGAL (NAIL)- #1    [provider]  ondansetron (ZOFRAN-ODT) 4 MG disintegrating tablet Take 1 tablet (4 mg total) by mouth every 8 (eight) hours as needed for nausea or vomiting. 12/04/18   Davonna Belling, MD  Polyethylene Glycol 3350 (MIRALAX PO) Take 17 g by mouth as needed.     [provider]  Polyvinyl Alcohol-Povidone (REFRESH OP) Apply to eye as needed (Dry eyes).    [provider]  PROAIR HFA 108 (90 Base) MCG/ACT inhaler TAKE 2 PUFFS AS NEEDED EVERY 4-6 HOURS AS NEEDED COUGH/WHEEZE INHALATION 90 04/09/15   [provider]  Probiotic  Product (ALIGN) 4 MG CAPS Take 1 capsule by mouth daily.    [provider]  RESTASIS 0.05 % ophthalmic emulsion Place 1 drop into both eyes 2 (two) times daily. Uses as directed 09/30/12   [provider]  rosuvastatin (CRESTOR) 10 MG tablet TAKE ONE HALF TABLET ONCE A DAY AT BEDTIME ORALLY 90 DAY(S) 03/28/15   [provider]  Wheat Dextrin (BENEFIBER PO) Take 1 Dose by mouth daily.    [provider]    Family History Family History  Problem Relation Age of Onset  . Dementia Mother   . Irritable bowel syndrome Mother   . Alzheimer's disease Mother   . Stroke Mother   . Hypertension Father   . Congestive Heart Failure Father   . Alcohol abuse Father   . Diabetes Sister   . Allergy (severe) Sister   . Colon cancer Paternal Aunt   . Heart disease Maternal Grandmother   . Heart attack Maternal Grandmother   . Heart attack Paternal Grandfather   . Heart attack Maternal Grandfather   . Arthritis Brother   . Colon polyps Brother   . Other Sister        pituitary disease  . Allergies Sister   . Other Sister        joint problems  . Breast cancer Sister 71  . Colon polyps Sister   . Esophageal cancer Neg Hx   . Rectal cancer Neg Hx   . Stomach cancer Neg Hx     Social History Social History   Tobacco Use  . Smoking status: Former Smoker    Packs/day: 0.25    Years: 15.00    Pack years: 3.75  Types: Cigarettes    Quit date: 06/01/1975    Years since quitting: 43.6  . Smokeless tobacco: Never Used  . Tobacco comment: Stopped smoking age 22  Substance Use Topics  . Alcohol use: No    Alcohol/week: 0.0 standard drinks  . Drug use: No     Allergies   Azithromycin, Erythromycin, Almond oil, Amoxicillin-pot clavulanate, Atorvastatin, Codeine, Ketoconazole, Ketoconazole, Latex, Lincomycin, Morphine, Morphine sulfate, Other, Shellfish allergy, Simvastatin, Sulfamethoxazole-trimethoprim, Clindamycin, Clindamycin/lincomycin, and Erythromycin  base   Review of Systems Review of Systems  Constitutional: Negative for chills, diaphoresis and fever.  HENT: Negative.   Respiratory: Positive for chest tightness and shortness of breath.   Cardiovascular: Positive for chest pain.  Gastrointestinal: Negative.  Negative for abdominal pain and nausea.  Musculoskeletal: Negative.   Skin: Negative.   Neurological: Negative.  Negative for light-headedness.     Physical Exam Updated Vital Signs BP (!) 169/126 (BP Location: Right Arm)   Pulse 91   Temp 97.9 F (36.6 C) (Oral)   Resp 17   Ht 5\' 6"  (1.676 m)   Wt 80.7 kg   LMP  (LMP Unknown)   SpO2 98%   BMI 28.73 kg/m   Physical Exam Vitals signs and nursing note reviewed.  Constitutional:      Appearance: She is well-developed.  HENT:     Head: Normocephalic.  Neck:     Musculoskeletal: Normal range of motion and neck supple.  Cardiovascular:     Rate and Rhythm: Normal rate and regular rhythm.  Pulmonary:     Effort: Pulmonary effort is normal.     Breath sounds: Normal breath sounds. No wheezing, rhonchi or rales.  Chest:     Chest wall: No tenderness.  Abdominal:     General: Bowel sounds are normal.     Palpations: Abdomen is soft.     Tenderness: There is no abdominal tenderness. There is no guarding or rebound.  Musculoskeletal: Normal range of motion.     Right lower leg: No edema.     Left lower leg: No edema.  Skin:    General: Skin is warm and dry.  Neurological:     Mental Status: She is alert and oriented to person, place, and time.      ED Treatments / Results  Labs (all labs ordered are listed, but only abnormal results are displayed) Labs Reviewed  CBC  BASIC METABOLIC PANEL  TROPONIN I (HIGH SENSITIVITY)    EKG None  Radiology No results found.  Procedures Procedures (including critical care time)  Medications Ordered in ED Medications  sodium chloride flush (NS) 0.9 % injection 3 mL (3 mLs Intravenous Given 01/28/19 0122)      Initial Impression / Assessment and Plan / ED Course  I have reviewed the triage vital signs and the nursing notes.  Pertinent labs & imaging results that were available during my care of the patient were reviewed by me and considered in my medical decision making (see chart for details).        Patient to ED with chest tightness and SOB tonight while going to bed. She initially felt better after sitting up from reclining position, however, has had similar episodes since. No pain/tightness now.   The patient is well appearing, comfortable. Blood pressure initially elevated which resolved without intervention. EKG without ischemia. Initial troponin 11. CXR clear.   She is asymptomatic currently but reports episodes of chest tightness. VSS. Delta trop elevated to 51 on recheck. Given risk factors,  the patient will need admission for serial enzymes. Patient and husband updated.   Discussed with Dr. Jonelle Sidle who accepts the patient for admission.  Final Clinical Impressions(s) / ED Diagnoses   Final diagnoses:  None   1. Chest pain 2. Dyspnea   ED Discharge Orders    None       [name], PA-C 01/28/19 [id number]    Merryl Hacker, MD 01/29/19 [phone]

## 2019-01-28 NOTE — H&P (Signed)
History and Physical   Kimberly [name] [id number] DOB: March 29, 1941 DOA: 01/28/2019  Referring MD/NP/PA: Dr. Dina Rich  PCP: Lajean Manes, MD   Patient coming from: Home  Chief Complaint: Chest pain  HPI: Kimberly [name] is a 78 y.o. female with medical history significant of atrial fibrillation, fibromyalgia, chronic anticoagulation, hyperlipidemia, obstructive sleep apnea on CPAP, hypothyroidism, hypertension who presents to the ER with substernal chest pain.  Patient has had recurrent chest pain and multiple visits in the past.  Chest pain is rated as 5 out of 10 no radiation.  Denied any shortness of breath or diaphoresis.  Patient has been doing fine taking Eliquis for her paroxysmal atrial fibrillation.  Just recently had colonoscopy on the 27th and did much better afterwards.  She is now chest pain-free after treatment in the ER.  Cardiology was consulted by ER who recommended admission for to rule out MI by the medical service.  No PND or orthopnea..  ED Course: Temperature 97.9 blood pressure 169/126 pulse 91 respiratory of 25 oxygen sat 93% room air.  Sodium is 138 potassium 3.5 chloride 104 CO2 25 with glucose 105 BUN 15 creatinine 0.68 the rest of the chemistry and CBC is within normal.  Chest x-ray showed no active disease.  Initial troponin XI and then second troponin 51.  EKG showed abnormalities but unchanged from previous  Review of Systems: As per HPI otherwise 10 point review of systems negative.    Past Medical History:  Diagnosis Date  . Allergy   . Anal polyp 1998   Flex Sig   . Anemia   . Angular blepharitis of left eye   . Ankle fracture    Stress fracture  . Anxiety   . Asthma    border line has inhaler  . Bunion   . Cataracts, bilateral   . Corneal scar    left eye  . Degenerative disc disease   . Depression   . Diverticulosis of colon (without mention of hemorrhage) 2010   Colonoscopy  . Dry eyes    bilateral  . Enterocele   . External hemorrhoids  2000   Colonoscopy  . Family history of malignant neoplasm of gastrointestinal tract   . Female cystocele   . Fibromyalgia   . GERD (gastroesophageal reflux disease)   . Hiatal hernia 2005,2010   EGD  . History of bronchitis   . History of measles   . History of mumps   . History of strep sore throat   . History of urinary tract infection   . Hyperlipemia   . Hypothyroidism   . IBS (irritable bowel syndrome)   . Imbalance   . Internal hemorrhoids without mention of complication 0000000   Colonoscopy   . Itching   . 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 Menopause   . Migraine   . OSA (obstructive sleep apnea) 05/14/2015   on BiPAP  . Osteopenia 10/2013   T score -1.6 FRAX 10%/1.6%  . PAF (paroxysmal atrial fibrillation) ([place])   . Pancreatitis   . PCO (posterior capsular opacification)    left  . Pneumonia    childhood illness  . PONV (postoperative nausea and vomiting)   . Pseudophakia, both eyes   . PVD (posterior vitreous detachment) right  . Rash    on back   . Retinal scar    left  . Rotator cuff disorder    pain, left shoulder  . Shingles   . Stricture and stenosis of esophagus 2005,2010  EGD   . Stroke ([place])   . Ulcerative colitis ([place])   . Varicose veins   . Vertigo   . Wears glasses     Past Surgical History:  Procedure Laterality Date  . ABDOMINAL HYSTERECTOMY    . ANAL FISSURE REPAIR    . BREAST EXCISIONAL BIOPSY Left   . BUNIONECTOMY  2014  . CATARACT EXTRACTION Bilateral 2013  . COLONOSCOPY     polyp removed  . ESOPHAGEAL DILATION    . EYE SURGERY Left   . FOOT SURGERY     left   . HEMORRHOID SURGERY    . MOUTH SURGERY  11/13/11   Cyst removed from gum-benign   . NASAL SEPTUM SURGERY    . NASAL SEPTUM SURGERY  1970's  . REPLACEMENT TOTAL KNEE Right 2011  . TONSILLECTOMY    . TOTAL ABDOMINAL HYSTERECTOMY W/ BILATERAL SALPINGOOPHORECTOMY  1991   TAH BSO  . TOTAL HIP ARTHROPLASTY     right   . TOTAL KNEE ARTHROPLASTY     both  . TOTAL KNEE  ARTHROPLASTY Left 06/23/2015   Procedure: LEFT TOTAL KNEE ARTHROPLASTY;  Surgeon: Gaynelle Arabian, MD;  Location: [facility];  Service: Orthopedics;  Laterality: Left;     reports that she quit smoking about 43 years ago. Her smoking use included cigarettes. She has a 3.75 pack-year smoking history. She has never used smokeless tobacco. She reports that she does not drink alcohol or use drugs.  Allergies  Allergen Reactions  . Azithromycin Other (See Comments)    Pancreatitis  . Erythromycin Hives    Reaction 1 time, when she was younger. Reaction 1 time, when she was younger.   Toniann Ket Other (See Comments)    Almonds: per allergy testing.  . Amoxicillin-Pot Clavulanate Nausea And Vomiting and Nausea Only    Has patient had a PCN reaction causing immediate rash, facial/tongue/throat swelling, SOB or lightheadedness with hypotension: No Has patient had a PCN reaction causing severe rash involving mucus membranes or skin necrosis: No Has patient had a PCN reaction that required hospitalization No Has patient had a PCN reaction occurring within the last 10 years: No If all of the above answers are "NO", then may proceed with Cephalosporin use.   . Atorvastatin Other (See Comments)    Muscle weakness  Muscle weakness   . Codeine Nausea Only  . Ketoconazole Nausea And Vomiting  . Ketoconazole Nausea Only and Nausea And Vomiting  . Latex Other (See Comments)    Other  . Lincomycin     Stomach upset   . Morphine Nausea Only  . Morphine Sulfate Nausea Only and Other (See Comments)      Unknown  . Other Other (See Comments)    Shellfish mix: per allergy testing.  . Shellfish Allergy Other (See Comments)    Shellfish mix: per allergy testing.  . Simvastatin Other (See Comments)    Muscle pain  . Sulfamethoxazole-Trimethoprim Nausea Only    Upset stomach   . Clindamycin Rash  . Clindamycin/Lincomycin Nausea And Vomiting and Rash  . Erythromycin Base Rash    Family History   Problem Relation Age of Onset  . Dementia Mother   . Irritable bowel syndrome Mother   . Alzheimer's disease Mother   . Stroke Mother   . Hypertension Father   . Congestive Heart Failure Father   . Alcohol abuse Father   . Diabetes Sister   . Allergy (severe) Sister   . Colon cancer Paternal Aunt   .  Heart disease Maternal Grandmother   . Heart attack Maternal Grandmother   . Heart attack Paternal Grandfather   . Heart attack Maternal Grandfather   . Arthritis Brother   . Colon polyps Brother   . Other Sister        pituitary disease  . Allergies Sister   . Other Sister        joint problems  . Breast cancer Sister 25  . Colon polyps Sister   . Esophageal cancer Neg Hx   . Rectal cancer Neg Hx   . Stomach cancer Neg Hx      Prior to Admission medications   Medication Sig Start Date End Date Taking? Authorizing Provider  acetaminophen (TYLENOL) 500 MG tablet Take 500 mg by mouth every 6 (six) hours as needed for moderate pain.     [provider]  Calcium Carbonate-Vitamin D (CALTRATE 600+D) 600-400 MG-UNIT tablet Take 1 tablet by mouth 2 (two) times daily.    [provider]  diazepam (VALIUM) 5 MG tablet Take Valium 5 mg 1 hour prior to your cardiac CT scan. You must have someone to drive you to/from CT. Patient not taking: Reported on 01/25/2019 12/25/18   Sueanne Margarita, MD  diltiazem (CARDIZEM CD) 120 MG 24 hr capsule TAKE 1 CAPSULE (120 MG TOTAL) BY MOUTH AT BEDTIME. 08/14/18 08/14/19  Sherran Needs, NP  ELIQUIS 5 MG TABS tablet TAKE 1 TABLET BY MOUTH TWICE A DAY 05/08/18   Sherran Needs, NP  EPINEPHrine 0.3 mg/0.3 mL IJ SOAJ injection Inject 0.3 mg into the muscle as needed (for allergic reaction). Reported on 05/28/2015     [provider]  fluticasone (FLONASE) 50 MCG/ACT nasal spray Place 2 sprays into both nostrils every other day.  06/06/13   [provider]  levothyroxine (SYNTHROID) 100 MCG tablet Take 100 mcg by mouth daily  before breakfast.    [provider]  montelukast (SINGULAIR) 10 MG tablet every evening. 07/30/18   [provider]  NON FORMULARY [facility]  ANTIFUNGAL (NAIL)- #1    [provider]  ondansetron (ZOFRAN-ODT) 4 MG disintegrating tablet Take 1 tablet (4 mg total) by mouth every 8 (eight) hours as needed for nausea or vomiting. 12/04/18   Davonna Belling, MD  Polyethylene Glycol 3350 (MIRALAX PO) Take 17 g by mouth as needed.     [provider]  Polyvinyl Alcohol-Povidone (REFRESH OP) Apply to eye as needed (Dry eyes).    [provider]  PROAIR HFA 108 (90 Base) MCG/ACT inhaler TAKE 2 PUFFS AS NEEDED EVERY 4-6 HOURS AS NEEDED COUGH/WHEEZE INHALATION 90 04/09/15   [provider]  Probiotic Product (ALIGN) 4 MG CAPS Take 1 capsule by mouth daily.    [provider]  RESTASIS 0.05 % ophthalmic emulsion Place 1 drop into both eyes 2 (two) times daily. Uses as directed 09/30/12   [provider]  rosuvastatin (CRESTOR) 10 MG tablet TAKE ONE HALF TABLET ONCE A DAY AT BEDTIME ORALLY 90 DAY(S) 03/28/15   [provider]  Wheat Dextrin (BENEFIBER PO) Take 1 Dose by mouth daily.    [provider]    Physical Exam: Vitals:   01/28/19 0230 01/28/19 0300 01/28/19 0330 01/28/19 0400  BP: (!) 107/93 (!) 106/57 115/64 99/69  Pulse: (!) 59 (!) 58 (!) 58 64  Resp: (!) 25 17 13 17   Temp:      TempSrc:      SpO2: 96% 94% 96% 94%  Weight:      Height:          Constitutional: NAD, anxious Vitals:   01/28/19 0230 01/28/19 0300 01/28/19 0330 01/28/19 0400  BP: (!) 107/93 (!) 106/57 115/64 99/69  Pulse: (!) 59 (!) 58 (!) 58 64  Resp: (!) 25 17 13 17   Temp:      TempSrc:      SpO2: 96% 94% 96% 94%  Weight:      Height:       Eyes: PERRL, lids and conjunctivae normal ENMT: Mucous membranes are moist. Posterior pharynx clear of any exudate or lesions.Normal dentition.  Neck: normal, supple, no masses,  no thyromegaly Respiratory: clear to auscultation bilaterally, no wheezing, no crackles. Normal respiratory effort. No accessory muscle use.  Cardiovascular: Tachycardia, no murmurs / rubs / gallops. No extremity edema. 2+ pedal pulses. No carotid bruits.  Abdomen: no tenderness, no masses palpated. No hepatosplenomegaly. Bowel sounds positive.  Musculoskeletal: no clubbing / cyanosis. No joint deformity upper and lower extremities. Good ROM, no contractures. Normal muscle tone.  Skin: no rashes, lesions, ulcers. No induration Neurologic: CN 2-12 grossly intact. Sensation intact, DTR normal. Strength 5/5 in all 4.  Psychiatric: Normal judgment and insight. Alert and oriented x 3. Normal mood.     Labs on Admission: I have personally reviewed following labs and imaging studies  CBC: Recent Labs  Lab 01/28/19 0050  WBC 6.3  HGB 12.7  HCT 38.5  MCV 95.8  PLT [id number]   Basic Metabolic Panel: Recent Labs  Lab 01/28/19 0050  NA 138  K 3.5  CL 101  CO2 25  GLUCOSE 105*  BUN 15  CREATININE 0.68  CALCIUM 9.4   GFR: Estimated Creatinine Clearance: 63.1 mL/min (by C-G formula based on SCr of 0.68 mg/dL). Liver Function Tests: No results for input(s): AST, ALT, ALKPHOS, BILITOT, PROT, ALBUMIN in the last 168 hours. No results for input(s): LIPASE, AMYLASE in the last 168 hours. No results for input(s): AMMONIA in the last 168 hours. Coagulation Profile: No results for input(s): INR, PROTIME in the last 168 hours. Cardiac Enzymes: No results for input(s): CKTOTAL, CKMB, CKMBINDEX, TROPONINI in the last 168 hours. BNP (last 3 results) No results for input(s): PROBNP in the last 8760 hours. HbA1C: No results for input(s): HGBA1C in the last 72 hours. CBG: No results for input(s): GLUCAP in the last 168 hours. Lipid Profile: No results for input(s): CHOL, HDL, LDLCALC, TRIG, CHOLHDL, LDLDIRECT in the last 72 hours. Thyroid Function Tests: No results for input(s): TSH, T4TOTAL,  FREET4, T3FREE, THYROIDAB in the last 72 hours. Anemia Panel: No results for input(s): VITAMINB12, FOLATE, FERRITIN, TIBC, IRON, RETICCTPCT in the last 72 hours. Urine analysis:    Component Value Date/Time   COLORURINE YELLOW 06/02/2018 1133   APPEARANCEUR CLOUDY (A) 06/02/2018 1133   LABSPEC 1.004 06/02/2018 1133   PHURINE 6.5 06/02/2018 1133   GLUCOSEU NEGATIVE 06/02/2018 1133   HGBUR 1+ (A) 06/02/2018 1133   BILIRUBINUR NEGATIVE 06/23/2017 0848   KETONESUR NEGATIVE 06/02/2018 1133   PROTEINUR NEGATIVE 06/02/2018 1133   UROBILINOGEN 0.2 08/27/2014 1039   NITRITE NEGATIVE 06/23/2017 0848   LEUKOCYTESUR NEGATIVE 06/23/2017 0848   Sepsis Labs: @LABRCNTIP (procalcitonin:4,lacticidven:4) )No results found for this or any previous visit (from the past 240 hour(s)).   Radiological Exams on Admission: Dg Chest Portable 1 View  Result Date: 01/28/2019 CLINICAL DATA:  Chest pain and pressure EXAM: PORTABLE CHEST 1 VIEW COMPARISON:  06/23/2017 FINDINGS: The heart size and mediastinal contours  are within normal limits. Mild aortic atherosclerosis. Both lungs are clear. The visualized skeletal structures are unremarkable. IMPRESSION: No active disease. Electronically Signed   By: Donavan Foil M.D.   On: 01/28/2019 03:09    EKG: Independently reviewed.  It shows normal sinus rhythm with first-degree AV block and bifascicular block.  Appears to be present in previous EKG.  Assessment/Plan Active Problems:   Hypothyroidism   Anxiety state   OSA (obstructive sleep apnea)   PAF (paroxysmal atrial fibrillation) ([facility])   Chest pain     #1 chest pain: Possibly angina.  Patient will be admitted for rule out.  Check serial cardiac enzymes.  Place on telemetry.  Continue with aspirin and statin.  Cardiology consult if enzymes continue to trend.  Continue home anticoagulation.  #2 paroxysmal atrial fibrillation: Now in sinus rhythm.  Continue anticoagulation and rate control  #3 anxiety  disorder: Continue home regimen.  #4 fibromyalgia: Continue home therapy.  #5 hypothyroidism: Continue levothyroxine  #6 hypertension: Continue blood pressure control.  Continue home regimen and adjust as necessary   DVT prophylaxis: Eliquis Code Status: Full code Family Communication: No family at bedside Disposition Plan: Home Consults called: Cardiology on-call consulted by ER Admission status: Observation  Severity of Illness: The appropriate patient status for this patient is OBSERVATION. Observation status is judged to be reasonable and necessary in order to provide the required intensity of service to ensure the patient's safety. The patient's presenting symptoms, physical exam findings, and initial radiographic and laboratory data in the context of their medical condition is felt to place them at decreased risk for further clinical deterioration. Furthermore, it is anticipated that the patient will be medically stable for discharge from the hospital within 2 midnights of admission. The following factors support the patient status of observation.   " The patient's presenting symptoms include chest pain. " The physical exam findings include anxious. " The initial radiographic and laboratory data are mildly elevated.     Barbette Merino MD [facility] Pager [phone]  If 7PM-7AM, please contact night-coverage [email or web address] Password [email or web address]  01/28/2019, 5:26 AM

## 2019-01-28 NOTE — Discharge Summary (Signed)
Physician Discharge Summary  Kimberly [name] [id number] DOB: 05/26/1941  PCP: Lajean Manes, MD  Admitted from: Home Discharged to: Home  Admit date: 01/28/2019 Discharge date: 01/28/2019  Recommendations for Outpatient Follow-up:   Follow-up Information    Stoneking, Christiane Ha, MD. Schedule an appointment as soon as possible for a visit in 1 week(s).   Specialty: Internal Medicine Contact information: [address] [place] [address] [phone]        Sueanne Margarita, MD. Schedule an appointment as soon as possible for a visit.   Specialty: Cardiology Contact information: [id number] [place] [address] [phone]            Home Health: None Equipment/Devices: None  Discharge Condition: Improved and stable CODE STATUS: Full Diet recommendation: Heart healthy diet.  Discharge Diagnoses:  Active Problems:   Hypothyroidism   Anxiety state   OSA (obstructive sleep apnea)   PAF (paroxysmal atrial fibrillation) (HCC)   Chest pain   Brief Summary: 78 year old married female with PMH of paroxysmal A. fib on Eliquis, anxiety and depression, asthma, fibromyalgia, GERD/hiatal hernia, HLD, hypothyroid, IBS, OSA on CPAP, CVA, chronic RBBB, presented to the [facility] ED due to chest pain.  Cardiology was consulted.  Assessment and plan:  1. Atypical chest pain: Patient had a late dinner on day of admission consisting of beans which were somewhat spicy.  She also had colonoscopy on 8/27 and was told air had to be inflated for the procedure.  She also started using a new CPAP headgear.  After she had gone to bed at around 11 PM, she felt that her CPAP was not fitting well, she had some associated dyspnea and then she started noticing some pressure-like sensation underneath bilateral rib cage which was moderate in intensity at 5/10 in severity, nonradiating and not associated with nausea, diaphoresis, dizziness or lightheadedness  or syncope.  She disconnected the CPAP and waited but the discomfort persisted for after 15 minutes.  She did report some reflux symptoms.  Due to persisting symptoms, she asked her spouse to activate EMS.  She reported that EMS told that her heart rate were in the 200s.  However no documentation was found related to this.  She apparently received 2 sublingual NTG with resolution of symptoms which had lasted in total for approximately 30 to 45 minutes.  She subsequently had brief return of similar symptoms but they resolved and has not recurred since.  EKG showed RBB morphology and no acute changes.  Troponins were trended: 11 > 51 > 121 > 135.  Cardiology was consulted and indicated that she has had many evaluations of chest pain in the past and currently follows with Dr. Golden Hurter with cardiology.  She had a negative nuclear stress test in February 2019 and even more reassuring is the fact that she had a coronary CTA in July 2020 that showed no evidence of CAD and calcium score was 0.  They feel that there may be a component of anxiety/stress contributing which is certainly possible.  They did not think that her troponin elevation represented true ischemia or atherosclerotic disease especially in light of her recent coronary CTA.  Telemetry monitoring did not show any arrhythmias.  They recommended getting a TTE which showed normal EF and was unremarkable.  I discussed with Cardiologist on-call who is reviewed her echo result and agrees.  DD for her symptoms are possibly due to GERD/hiatal hernia and recent colonoscopy with air insufflation  for procedure.  She appears to have come off of PPI.  Recommend PPI at least for short duration.  Discussed with patient, updated care and reassured her. 2. GERD/hiatal hernia: Protonix 40 mg daily. 3. PAF: Remains in sinus rhythm with BBB morphology.  Continue diltiazem and Eliquis.  I think she is on diltiazem for PAF and do not see HTN diagnosis. 4. Hyperlipidemia:  Continue statins. 5. OSA: Continue CPAP and may need to follow-up with her PCP if she has persistently ill fitting mask which may contribute to dyspnea and associated anxiety. 6. Hypothyroid: Clinically euthyroid.  Continue Synthroid. 7. Asthma: Stable without clinical bronchospasm. 8. S/p colonoscopy with multiple polypectomy 8/27: OP follow-up with [facility].   Consultations:  Cardiology  Procedures:  TTE 01/28/2019:   IMPRESSIONS    1. The left ventricle has normal systolic function, with an ejection fraction of 55-60%. The cavity size was normal. Left ventricular diastolic Doppler parameters are indeterminate. Indeterminate filling pressures.  2. The right ventricle has normal systolic function. The cavity was normal. There is no increase in right ventricular wall thickness. Right ventricular systolic pressure is normal with an estimated pressure of 33.8 mmHg.  3. Left atrial size was mildly dilated.  4. Mild thickening of the mitral valve leaflet. No evidence of mitral valve stenosis.  5. The aortic valve is tricuspid. No stenosis of the aortic valve.  6. The aorta is normal unless otherwise noted.  7. The inferior vena cava was dilated in size with >50% respiratory variability.  Discharge Instructions  Discharge Instructions    Call MD for:  difficulty breathing, headache or visual disturbances   Complete by: As directed    Call MD for:  extreme fatigue   Complete by: As directed    Call MD for:  persistant dizziness or light-headedness   Complete by: As directed    Call MD for:  severe uncontrolled pain   Complete by: As directed    Diet - low sodium heart healthy   Complete by: As directed    Increase activity slowly   Complete by: As directed        Medication List    STOP taking these medications   diazepam 5 MG tablet Commonly known as: Valium     TAKE these medications   acetaminophen 500 MG tablet Commonly known as: TYLENOL Take 500 mg by mouth  every 6 (six) hours as needed for moderate pain.   Align 4 MG Caps Take 1 capsule by mouth daily.   diltiazem 120 MG 24 hr capsule Commonly known as: CARDIZEM CD TAKE 1 CAPSULE (120 MG TOTAL) BY MOUTH AT BEDTIME.   Eliquis 5 MG Tabs tablet Generic drug: apixaban TAKE 1 TABLET BY MOUTH TWICE A DAY What changed: how much to take   EPINEPHrine 0.3 mg/0.3 mL Soaj injection Commonly known as: EPI-PEN Inject 0.3 mg into the muscle as needed (for allergic reaction). Reported on 05/28/2015   fluticasone 50 MCG/ACT nasal spray Commonly known as: FLONASE Place 2 sprays into both nostrils 2 (two) times daily as needed for allergies.   MIRALAX PO Take 17 g by mouth daily as needed (constipation).   ondansetron 4 MG disintegrating tablet Commonly known as: ZOFRAN-ODT Take 1 tablet (4 mg total) by mouth every 8 (eight) hours as needed for nausea or vomiting.   pantoprazole 40 MG tablet Commonly known as: Protonix Take 1 tablet (40 mg total) by mouth daily.   ProAir HFA 108 (90 Base) MCG/ACT inhaler Generic drug:  albuterol Inhale 1-2 puffs into the lungs every 6 (six) hours as needed for wheezing.   REFRESH OP Apply 1-2 drops to eye daily as needed (Dry eyes).   Restasis 0.05 % ophthalmic emulsion Generic drug: cycloSPORINE Place 1 drop into both eyes 2 (two) times daily. Uses as directed   rosuvastatin 10 MG tablet Commonly known as: CRESTOR Take 5 mg by mouth daily.   Synthroid 100 MCG tablet Generic drug: levothyroxine Take 100 mcg by mouth daily before breakfast.      Allergies  Allergen Reactions  . Azithromycin Other (See Comments)    Pancreatitis  . Erythromycin Hives    Reaction 1 time, when she was younger. Reaction 1 time, when she was younger.   Toniann Ket Other (See Comments)    Almonds: per allergy testing.  . Amoxicillin-Pot Clavulanate Nausea And Vomiting and Nausea Only    Has patient had a PCN reaction causing immediate rash, facial/tongue/throat  swelling, SOB or lightheadedness with hypotension: No Has patient had a PCN reaction causing severe rash involving mucus membranes or skin necrosis: No Has patient had a PCN reaction that required hospitalization No Has patient had a PCN reaction occurring within the last 10 years: No If all of the above answers are "NO", then may proceed with Cephalosporin use.   . Atorvastatin Other (See Comments)    Muscle weakness  Muscle weakness   . Codeine Nausea Only  . Ketoconazole Nausea And Vomiting  . Ketoconazole Nausea Only and Nausea And Vomiting  . Latex Other (See Comments)    Other  . Lincomycin     Stomach upset   . Morphine Nausea Only  . Morphine Sulfate Nausea Only and Other (See Comments)      Unknown  . Other Other (See Comments)    Shellfish mix: per allergy testing.  . Shellfish Allergy Other (See Comments)    Shellfish mix: per allergy testing.  . Simvastatin Other (See Comments)    Muscle pain  . Sulfamethoxazole-Trimethoprim Nausea Only    Upset stomach   . Clindamycin Rash  . Clindamycin/Lincomycin Nausea And Vomiting and Rash  . Erythromycin Base Rash      Procedures/Studies: Dg Chest Portable 1 View  Result Date: 01/28/2019 CLINICAL DATA:  Chest pain and pressure EXAM: PORTABLE CHEST 1 VIEW COMPARISON:  06/23/2017 FINDINGS: The heart size and mediastinal contours are within normal limits. Mild aortic atherosclerosis. Both lungs are clear. The visualized skeletal structures are unremarkable. IMPRESSION: No active disease. Electronically Signed   By: Donavan Foil M.D.   On: 01/28/2019 03:09      Subjective: Patient seen in the ED earlier today.  Spouse at bedside.  Detailed history as noted above.  Chest pain resolved sometime last night without recurrence.  Currently without any complaints.  Discharge Exam:  Vitals:   01/28/19 0615 01/28/19 0720 01/28/19 1209 01/28/19 1455  BP: 140/61 (!) 143/87 (!) 144/69 (!) 163/88  Pulse: (!) 58 61 62 77   Resp: 19 16 18 20   Temp:    98.1 F (36.7 C)  TempSrc:    Oral  SpO2: 97% 96% 97% 99%  Weight:      Height:        General: Pleasant elderly female, moderately built and nourished lying comfortably propped up in bed without distress. Cardiovascular: S1 & S2 heard, RRR, S1/S2 +. No murmurs, rubs, gallops or clicks. No JVD or pedal edema.  Telemetry personally reviewed: Sinus rhythm with BBB morphology. Respiratory: Clear to auscultation  without wheezing, rhonchi or crackles. No increased work of breathing. Abdominal:  Non distended, non tender & soft. No organomegaly or masses appreciated. Normal bowel sounds heard. CNS: Alert and oriented. No focal deficits. Extremities: no edema, no cyanosis.  Symmetric 5 x 5 power.    The results of significant diagnostics from this hospitalization (including imaging, microbiology, ancillary and laboratory) are listed below for reference.     Labs: CBC: Recent Labs  Lab 01/28/19 0050  WBC 6.3  HGB 12.7  HCT 38.5  MCV 95.8  PLT [id number]   Basic Metabolic Panel: Recent Labs  Lab 01/28/19 0050  NA 138  K 3.5  CL 101  CO2 25  GLUCOSE 105*  BUN 15  CREATININE 0.68  CALCIUM 9.4   I discussed in detail with patient's spouse at bedside, updated care and answered questions.  Time coordinating discharge: 25 minutes  SIGNED:  Vernell Leep, MD, FACP, [facility]. [facility]  To contact the attending provider between 7A-7P or the covering provider during after hours 7P-7A, please log into the web site [email or web address] and access using universal [place] password for that web site. If you do not have the password, please call the hospital operator.

## 2019-01-28 NOTE — Consult Note (Signed)
CARDIOLOGY CONSULT NOTE  Patient ID: Kimberly [name] MRN: [id number] DOB/AGE: 1940/07/09 78 y.o.  Admit date: 01/28/2019 Primary Cardiologist: Dr. Radford Pax Chief Complaint: Chest Pain Requesting: ED  HPI:   Kimberly [name] is a 78 y.o. female with medical history significant of atrial fibrillation (on DOAC), fibromyalgia, hyperlipidemia, OSA on CPAP, hypothyroidism, and hypertension who presents to the ER for evaluation of atypical chest pain.  Patient was in her USOH until approx 10pm on 01/27/19. She states that she was in her bed resting and watching TV when she developed chest pain in a bandlike pattern that traveled across her chest under her bilateral breasts. The chest pain was 5/10 in severity without radiation. This chest pain was associated with chest pressure and because of these symptoms she asked her husband to call EMS. Associated with these symptoms she reports that she may have been in AF and experiencing palpitations, but she is not sure. She states that EMS told her that her heart rates were over 200 bpm, but I can not find documentation or mention of this. With EMS, she received SLNTG which she reports improved her pain. All told, her pain lasted for a little over 30 minutes. She is now chest pain free. During this episode, she denies any shortness of breath, nausea/vomting, lightheadedness or dizziness, syncope/presyncope. She denies any PND/orthopnea.   EKG in the ED did not not show any dynamic ST changes with a chronic RBBB. Initial troponin was 11, but uptrended to 51.   Past Medical History:  Diagnosis Date  . Allergy   . Anal polyp 1998   Flex Sig   . Anemia   . Angular blepharitis of left eye   . Ankle fracture    Stress fracture  . Anxiety   . Asthma    border line has inhaler  . Bunion   . Cataracts, bilateral   . Corneal scar    left eye  . Degenerative disc disease   . Depression   . Diverticulosis of colon (without mention of hemorrhage) 2010   Colonoscopy  . Dry eyes    bilateral  . Enterocele   . External hemorrhoids 2000   Colonoscopy  . Family history of malignant neoplasm of gastrointestinal tract   . Female cystocele   . Fibromyalgia   . GERD (gastroesophageal reflux disease)   . Hiatal hernia 2005,2010   EGD  . History of bronchitis   . History of measles   . History of mumps   . History of strep sore throat   . History of urinary tract infection   . Hyperlipemia   . Hypothyroidism   . IBS (irritable bowel syndrome)   . Imbalance   . Internal hemorrhoids without mention of complication 0000000   Colonoscopy   . Itching   . 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 Menopause   . Migraine   . OSA (obstructive sleep apnea) 05/14/2015   on BiPAP  . Osteopenia 10/2013   T score -1.6 FRAX 10%/1.6%  . PAF (paroxysmal atrial fibrillation) ([place])   . Pancreatitis   . PCO (posterior capsular opacification)    left  . Pneumonia    childhood illness  . PONV (postoperative nausea and vomiting)   . Pseudophakia, both eyes   . PVD (posterior vitreous detachment) right  . Rash    on back   . Retinal scar    left  . Rotator cuff disorder    pain, left shoulder  . Shingles   .  Stricture and stenosis of esophagus 2005,2010   EGD   . Stroke ([place])   . Ulcerative colitis ([place])   . Varicose veins   . Vertigo   . Wears glasses     Past Surgical History:  Procedure Laterality Date  . ABDOMINAL HYSTERECTOMY    . ANAL FISSURE REPAIR    . BREAST EXCISIONAL BIOPSY Left   . BUNIONECTOMY  2014  . CATARACT EXTRACTION Bilateral 2013  . COLONOSCOPY     polyp removed  . ESOPHAGEAL DILATION    . EYE SURGERY Left   . FOOT SURGERY     left   . HEMORRHOID SURGERY    . MOUTH SURGERY  11/13/11   Cyst removed from gum-benign   . NASAL SEPTUM SURGERY    . NASAL SEPTUM SURGERY  1970's  . REPLACEMENT TOTAL KNEE Right 2011  . TONSILLECTOMY    . TOTAL ABDOMINAL HYSTERECTOMY W/ BILATERAL SALPINGOOPHORECTOMY  1991   TAH BSO  . TOTAL HIP  ARTHROPLASTY     right   . TOTAL KNEE ARTHROPLASTY     both  . TOTAL KNEE ARTHROPLASTY Left 06/23/2015   Procedure: LEFT TOTAL KNEE ARTHROPLASTY;  Surgeon: Gaynelle Arabian, MD;  Location: [facility];  Service: Orthopedics;  Laterality: Left;    Allergies  Allergen Reactions  . Azithromycin Other (See Comments)    Pancreatitis  . Erythromycin Hives    Reaction 1 time, when she was younger. Reaction 1 time, when she was younger.   Toniann Ket Other (See Comments)    Almonds: per allergy testing.  . Amoxicillin-Pot Clavulanate Nausea And Vomiting and Nausea Only    Has patient had a PCN reaction causing immediate rash, facial/tongue/throat swelling, SOB or lightheadedness with hypotension: No Has patient had a PCN reaction causing severe rash involving mucus membranes or skin necrosis: No Has patient had a PCN reaction that required hospitalization No Has patient had a PCN reaction occurring within the last 10 years: No If all of the above answers are "NO", then may proceed with Cephalosporin use.   . Atorvastatin Other (See Comments)    Muscle weakness  Muscle weakness   . Codeine Nausea Only  . Ketoconazole Nausea And Vomiting  . Ketoconazole Nausea Only and Nausea And Vomiting  . Latex Other (See Comments)    Other  . Lincomycin     Stomach upset   . Morphine Nausea Only  . Morphine Sulfate Nausea Only and Other (See Comments)      Unknown  . Other Other (See Comments)    Shellfish mix: per allergy testing.  . Shellfish Allergy Other (See Comments)    Shellfish mix: per allergy testing.  . Simvastatin Other (See Comments)    Muscle pain  . Sulfamethoxazole-Trimethoprim Nausea Only    Upset stomach   . Clindamycin Rash  . Clindamycin/Lincomycin Nausea And Vomiting and Rash  . Erythromycin Base Rash   (Not in a hospital admission)  Family History  Problem Relation Age of Onset  . Dementia Mother   . Irritable bowel syndrome Mother   . Alzheimer's disease Mother    . Stroke Mother   . Hypertension Father   . Congestive Heart Failure Father   . Alcohol abuse Father   . Diabetes Sister   . Allergy (severe) Sister   . Colon cancer Paternal Aunt   . Heart disease Maternal Grandmother   . Heart attack Maternal Grandmother   . Heart attack Paternal Grandfather   . Heart attack Maternal  Grandfather   . Arthritis Brother   . Colon polyps Brother   . Other Sister        pituitary disease  . Allergies Sister   . Other Sister        joint problems  . Breast cancer Sister 28  . Colon polyps Sister   . Esophageal cancer Neg Hx   . Rectal cancer Neg Hx   . Stomach cancer Neg Hx     Social History   Socioeconomic History  . Marital status: Married    Spouse name: Not on file  . Number of children: 1  . Years of education: bus.school  . Highest education level: Not on file  Occupational History  . Occupation: Retired    Fish farm manager: RETIRED  Social Needs  . Financial resource strain: Not on file  . Food insecurity    Worry: Not on file    Inability: Not on file  . Transportation needs    Medical: Not on file    Non-medical: Not on file  Tobacco Use  . Smoking status: Former Smoker    Packs/day: 0.25    Years: 15.00    Pack years: 3.75    Types: Cigarettes    Quit date: 06/01/1975    Years since quitting: 43.6  . Smokeless tobacco: Never Used  . Tobacco comment: Stopped smoking age 18  Substance and Sexual Activity  . Alcohol use: No    Alcohol/week: 0.0 standard drinks  . Drug use: No  . Sexual activity: Never    Birth control/protection: Surgical    Comment: HYST, intecourse age unknown, sexual partner less than 5  Lifestyle  . Physical activity    Days per week: Not on file    Minutes per session: Not on file  . Stress: Not on file  Relationships  . Social Herbalist on phone: Not on file    Gets together: Not on file    Attends religious service: Not on file    Active member of club or organization: Not on file     Attends meetings of clubs or organizations: Not on file    Relationship status: Not on file  . Intimate partner violence    Fear of current or ex partner: Not on file    Emotionally abused: Not on file    Physically abused: Not on file    Forced sexual activity: Not on file  Other Topics Concern  . Not on file  Social History Narrative   Daily caffeine       Review of Systems: 12 point ROS negative except as stated in HPI.   Physical Exam: Blood pressure 134/64, pulse 64, temperature 97.9 F (36.6 C), temperature source Oral, resp. rate 19, height 5\' 6"  (1.676 m), weight 80.7 kg, SpO2 94 %.   GENERAL: Patient is afebrile, Vital signs reviewed, Well appearing, Patient appears comfortable, Alert and lucid. NECK:  supple , normal inspection. CARD:  regular rate and rhythm, heart sounds normal. RESP:  no respiratory distress, breath sounds normal. ABD: soft, nontender to palpation, nondistended, BS present MUSC:  normal ROM, non-tender , no pedal edema . SKIN: color normal, no rash, warm, dry . NEURO: awake & alert, lucid, no motor/sensory deficit.  PSYCH: mood/affect normal.  Labs: Lab Results  Component Value Date   BUN 15 01/28/2019   Lab Results  Component Value Date   CREATININE 0.68 01/28/2019   Lab Results  Component Value Date   NA 138  01/28/2019   K 3.5 01/28/2019   CL 101 01/28/2019   CO2 25 01/28/2019   Lab Results  Component Value Date   TROPONINI <0.03 07/21/2017   Lab Results  Component Value Date   WBC 6.3 01/28/2019   HGB 12.7 01/28/2019   HCT 38.5 01/28/2019   MCV 95.8 01/28/2019   PLT 210 01/28/2019   Lab Results  Component Value Date   CHOL 124 07/23/2013   HDL 68 07/23/2013   LDLCALC 42 07/23/2013   TRIG 69 07/23/2013   CHOLHDL 1.8 07/23/2013   Lab Results  Component Value Date   ALT 15 12/04/2018   AST 21 12/04/2018   ALKPHOS 41 12/04/2018   BILITOT 0.5 12/04/2018    ASSESSMENT AND PLAN:    Kimberly [name] is a 78 y.o. female  with medical history significant of atrial fibrillation (on DOAC), fibromyalgia, hyperlipidemia, OSA on CPAP, hypothyroidism, and hypertension who presents to the ER for evaluation of atypical chest pain. Troponin was also found to be elevated (11 --> 51).  Patient has had many evaluations of chest pain in the past and current follows with Dr. Radford Pax in cardiology. For the past several months she has had recurrent chest tightness. Reassuringly, she had a negative nuclear stress test back in 07/2017. Even more reassuring, is the fact that on a coronary CTA in 11/2018, patient was found to have no evidence of CAD and CAC score of 0.   Unclear what the etiology of her pain is. There may be some component of anxiety/stress which the patient readily volunteers, although this remains a diagnosis of exclusion. I don't think that her troponin elevation represents true ischemia or atherosclerotic disease, especially in light of her recent coronary CTA. Patient is chest pain free now.   -Recommend continuing to monitor her closely on telemetry  - Would obtain a TTE to evaluate for potential segmental wall motion abnormalities or valvular/pericardial disease. Last one was performed in 05/2016.  Signed: Clois Dupes 01/28/2019, 6:23 AM

## 2019-01-28 NOTE — Progress Notes (Signed)
Pt given AVS, advised no meds have been given today, advised of appt to be made for cardiology and PCP as unable to make due to office closed all belongings with pt,pt dc home in stable condition

## 2019-01-28 NOTE — ED Notes (Signed)
Patient ambulated to restroom with stand by assist. No difficulty with ambulation noted or reported.

## 2019-01-28 NOTE — Discharge Instructions (Signed)

## 2019-01-28 NOTE — Progress Notes (Signed)
  Echocardiogram 2D Echocardiogram has been performed.  Kimberly [name] 01/28/2019, 12:56 PM

## 2019-01-28 NOTE — Progress Notes (Addendum)
Pt received to  3e3, when nurse went to document admit, noted dc orders, paged Dr Algis Liming to advise arrival, he spoke to pt on phone and pt verbaliized understanding, pt says she is very hungry, tray ordered, awaiting husband to come pick up pt, pt denies pain

## 2019-01-28 NOTE — ED Notes (Signed)
Echocardiogram completed.

## 2019-01-28 NOTE — ED Notes (Signed)
ED TO INPATIENT HANDOFF REPORT  ED Nurse Name and Phone #: Carlis Abbott [phone]  S Name/Age/Gender Kimberly [name] 78 y.o. female Room/Bed: 041C/041C  Code Status   Code Status: Full Code  Home/SNF/Other Home Patient oriented to: self, time, place, situation Is this baseline? Yes   Triage Complete: Triage complete  Chief Complaint Chest Pain  Triage Note Patient with chest pain that started at 2300, was hypertensive with the pain.  No nausea or vomiting, but does have some anxiety and shortness of breath.  She did have a colonoscopy done yesterday.  She was given one SL nitro with decrease in pain and decrease BP per EMS.  Pain level is 2/10.     Allergies Allergies  Allergen Reactions  . Azithromycin Other (See Comments)    Pancreatitis  . Erythromycin Hives    Reaction 1 time, when she was younger. Reaction 1 time, when she was younger.   Toniann Ket Other (See Comments)    Almonds: per allergy testing.  . Amoxicillin-Pot Clavulanate Nausea And Vomiting and Nausea Only    Has patient had a PCN reaction causing immediate rash, facial/tongue/throat swelling, SOB or lightheadedness with hypotension: No Has patient had a PCN reaction causing severe rash involving mucus membranes or skin necrosis: No Has patient had a PCN reaction that required hospitalization No Has patient had a PCN reaction occurring within the last 10 years: No If all of the above answers are "NO", then may proceed with Cephalosporin use.   . Atorvastatin Other (See Comments)    Muscle weakness  Muscle weakness   . Codeine Nausea Only  . Ketoconazole Nausea And Vomiting  . Ketoconazole Nausea Only and Nausea And Vomiting  . Latex Other (See Comments)    Other  . Lincomycin     Stomach upset   . Morphine Nausea Only  . Morphine Sulfate Nausea Only and Other (See Comments)      Unknown  . Other Other (See Comments)    Shellfish mix: per allergy testing.  . Shellfish Allergy Other (See  Comments)    Shellfish mix: per allergy testing.  . Simvastatin Other (See Comments)    Muscle pain  . Sulfamethoxazole-Trimethoprim Nausea Only    Upset stomach   . Clindamycin Rash  . Clindamycin/Lincomycin Nausea And Vomiting and Rash  . Erythromycin Base Rash    Level of Care/Admitting Diagnosis ED Disposition    ED Disposition Condition [facility] Area: [place] [[id number]]  Level of Care: Telemetry Cardiac [103]  I expect the patient will be discharged within 24 hours: Yes  LOW acuity---Tx typically complete <24 hrs---ACUTE conditions typically can be evaluated <24 hours---LABS likely to return to acceptable levels <24 hours---IS near functional baseline---EXPECTED to return to current living arrangement---NOT newly hypoxic: Meets criteria for 5C-Observation unit  Covid Evaluation: Asymptomatic Screening Protocol (No Symptoms)  Diagnosis: Chest pain [id number]  Admitting Physician: Elwyn Reach [[id number]]  Attending Physician: Elwyn Reach [[id number]]  PT Class (Do Not Modify): Observation [104]  PT Acc Code (Do Not Modify): Observation [10022]       B Medical/Surgery History Past Medical History:  Diagnosis Date  . Allergy   . Anal polyp 1998   Flex Sig   . Anemia   . Angular blepharitis of left eye   . Ankle fracture    Stress fracture  . Anxiety   . Asthma    border line has inhaler  . Bunion   .  Cataracts, bilateral   . Corneal scar    left eye  . Degenerative disc disease   . Depression   . Diverticulosis of colon (without mention of hemorrhage) 2010   Colonoscopy  . Dry eyes    bilateral  . Enterocele   . External hemorrhoids 2000   Colonoscopy  . Family history of malignant neoplasm of gastrointestinal tract   . Female cystocele   . Fibromyalgia   . GERD (gastroesophageal reflux disease)   . Hiatal hernia 2005,2010   EGD  . History of bronchitis   . History of measles   . History of mumps   . History of strep  sore throat   . History of urinary tract infection   . Hyperlipemia   . Hypothyroidism   . IBS (irritable bowel syndrome)   . Imbalance   . Internal hemorrhoids without mention of complication 0000000   Colonoscopy   . Itching   . 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 Menopause   . Migraine   . OSA (obstructive sleep apnea) 05/14/2015   on BiPAP  . Osteopenia 10/2013   T score -1.6 FRAX 10%/1.6%  . PAF (paroxysmal atrial fibrillation) ([place])   . Pancreatitis   . PCO (posterior capsular opacification)    left  . Pneumonia    childhood illness  . PONV (postoperative nausea and vomiting)   . Pseudophakia, both eyes   . PVD (posterior vitreous detachment) right  . Rash    on back   . Retinal scar    left  . Rotator cuff disorder    pain, left shoulder  . Shingles   . Stricture and stenosis of esophagus 2005,2010   EGD   . Stroke ([place])   . Ulcerative colitis ([place])   . Varicose veins   . Vertigo   . Wears glasses    Past Surgical History:  Procedure Laterality Date  . ABDOMINAL HYSTERECTOMY    . ANAL FISSURE REPAIR    . BREAST EXCISIONAL BIOPSY Left   . BUNIONECTOMY  2014  . CATARACT EXTRACTION Bilateral 2013  . COLONOSCOPY     polyp removed  . ESOPHAGEAL DILATION    . EYE SURGERY Left   . FOOT SURGERY     left   . HEMORRHOID SURGERY    . MOUTH SURGERY  11/13/11   Cyst removed from gum-benign   . NASAL SEPTUM SURGERY    . NASAL SEPTUM SURGERY  1970's  . REPLACEMENT TOTAL KNEE Right 2011  . TONSILLECTOMY    . TOTAL ABDOMINAL HYSTERECTOMY W/ BILATERAL SALPINGOOPHORECTOMY  1991   TAH BSO  . TOTAL HIP ARTHROPLASTY     right   . TOTAL KNEE ARTHROPLASTY     both  . TOTAL KNEE ARTHROPLASTY Left 06/23/2015   Procedure: LEFT TOTAL KNEE ARTHROPLASTY;  Surgeon: Gaynelle Arabian, MD;  Location: [facility];  Service: Orthopedics;  Laterality: Left;     A IV Location/Drains/Wounds Patient Lines/Drains/Airways Status   Active Line/Drains/Airways    Name:   Placement date:   Placement  time:   Site:   Days:   Peripheral IV 01/28/19 Left Forearm   01/28/19    0604    Forearm   less than 1   Incision (Closed) 06/23/15 Knee Left   06/23/15    0818     1315          Intake/Output Last 24 hours No intake or output data in the 24 hours ending 01/28/19 1219  Labs/Imaging Results  for orders placed or performed during the hospital encounter of 01/28/19 (from the past 48 hour(s))  Basic metabolic panel     Status: Abnormal   Collection Time: 01/28/19 12:50 AM  Result Value Ref Range   Sodium 138 135 - 145 mmol/L   Potassium 3.5 3.5 - 5.1 mmol/L   Chloride 101 98 - 111 mmol/L   CO2 25 22 - 32 mmol/L   Glucose, Bld 105 (H) 70 - 99 mg/dL   BUN 15 8 - 23 mg/dL   Creatinine, Ser 0.68 0.44 - 1.00 mg/dL   Calcium 9.4 8.9 - 10.3 mg/dL   GFR calc non Af Amer >60 >60 mL/min   GFR calc Af Amer >60 >60 mL/min   Anion gap 12 5 - 15    Comment: Performed at [facility], [place] [address], [place], [place] [address]  CBC     Status: None   Collection Time: 01/28/19 12:50 AM  Result Value Ref Range   WBC 6.3 4.0 - 10.5 K/uL   RBC 4.02 3.87 - 5.11 MIL/uL   Hemoglobin 12.7 12.0 - 15.0 g/dL   HCT 38.5 36.0 - 46.0 %   MCV 95.8 80.0 - 100.0 fL   MCH 31.6 26.0 - 34.0 pg   MCHC 33.0 30.0 - 36.0 g/dL   RDW 12.3 11.5 - 15.5 %   Platelets 210 150 - 400 K/uL   nRBC 0.0 0.0 - 0.2 %    Comment: Performed at [facility], [place] [address] [place], [place] [address]  Troponin I (High Sensitivity)     Status: None   Collection Time: 01/28/19 12:50 AM  Result Value Ref Range   Troponin I (High Sensitivity) 11 <18 ng/L    Comment: (NOTE) Elevated high sensitivity troponin I (hsTnI) values and significant  changes across serial measurements may suggest ACS but many other  chronic and acute conditions are known to elevate hsTnI results.  Refer to the "Links" section for chest pain algorithms and additional  guidance. Performed at [facility], [place] [address], [place],  [place] [address]   Troponin I (High Sensitivity)     Status: Abnormal   Collection Time: 01/28/19  2:51 AM  Result Value Ref Range   Troponin I (High Sensitivity) 51 (H) <18 ng/L    Comment: RESULT CALLED TO, READ BACK BY AND VERIFIED WITH: Billie Lade 01/28/19 0340 WAYK Performed at [facility] [address], [place], [place] [address]   Troponin I (High Sensitivity)     Status: Abnormal   Collection Time: 01/28/19  8:30 AM  Result Value Ref Range   Troponin I (High Sensitivity) 121 (HH) <18 ng/L    Comment: CRITICAL RESULT CALLED TO, READ BACK BY AND VERIFIED WITH: C.Alecsander Hattabaugh,RN @ 0941 01/28/2019 WEBBERJ (NOTE) Elevated high sensitivity troponin I (hsTnI) values and significant  changes across serial measurements may suggest ACS but many other  chronic and acute conditions are known to elevate hsTnI results.  Refer to the Links section for chest pain algorithms and additional  guidance. Performed at [facility], [place] [address] [place], [place] [address]   Troponin I (High Sensitivity)     Status: Abnormal   Collection Time: 01/28/19 10:18 AM  Result Value Ref Range   Troponin I (High Sensitivity) 135 (HH) <18 ng/L    Comment: CRITICAL VALUE NOTED.  VALUE IS CONSISTENT WITH PREVIOUSLY REPORTED AND CALLED VALUE. (NOTE) Elevated high sensitivity troponin I (hsTnI) values  and significant  changes across serial measurements may suggest ACS but many other  chronic and acute conditions are known to elevate hsTnI results.  Refer to the Links section for chest pain algorithms and additional  guidance. Performed at [facility], [place] [address] [place], [place] [address]    Dg Chest Portable 1 View  Result Date: 01/28/2019 CLINICAL DATA:  Chest pain and pressure EXAM: PORTABLE CHEST 1 VIEW COMPARISON:  06/23/2017 FINDINGS: The heart size and mediastinal contours are within normal limits. Mild aortic atherosclerosis. Both lungs are clear. The visualized skeletal  structures are unremarkable. IMPRESSION: No active disease. Electronically Signed   By: Donavan Foil M.D.   On: 01/28/2019 03:09    Pending Labs Unresulted Labs (From admission, onward)    Start     Ordered   01/28/19 0510  SARS CORONAVIRUS 2 (TAT 6-12 HRS) Nasal Swab Aptima Multi Swab  (Asymptomatic/Tier 2 Patients Labs)  Once,   STAT    Question Answer Comment  Is this test for diagnosis or screening Screening   Symptomatic for COVID-19 as defined by CDC No   Hospitalized for COVID-19 No   Admitted to ICU for COVID-19 No   Previously tested for COVID-19 No   Resident in a congregate (group) care setting No   Employed in healthcare setting No   Pregnant No      01/28/19 0509          Vitals/Pain Today's Vitals   01/28/19 0720 01/28/19 0726 01/28/19 0726 01/28/19 1209  BP: (!) 143/87   (!) 144/69  Pulse: 61   62  Resp: 16   18  Temp:      TempSrc:      SpO2: 96%   97%  Weight:      Height:      PainSc:  0-No pain 0-No pain 0-No pain    Isolation Precautions No active isolations  Medications Medications  acetaminophen (TYLENOL) tablet 650 mg (has no administration in time range)  ondansetron (ZOFRAN) injection 4 mg (has no administration in time range)  0.9 %  sodium chloride infusion ( Intravenous New Bag/Given 01/28/19 0604)  sodium chloride flush (NS) 0.9 % injection 3 mL (3 mLs Intravenous Given 01/28/19 0122)    Mobility walks Low fall risk   Focused Assessments Cardiac Assessment Handoff:  Cardiac Rhythm: Normal sinus rhythm Lab Results  Component Value Date   TROPONINI <0.03 07/21/2017   No results found for: DDIMER Does the Patient currently have chest pain? No     R Recommendations: See Admitting Provider Note  Report given to:   Additional Notes: Patient has denied chest pain all morning. Currently undergoing echocardiogram at 01/28/2019   Warm Hand Off Completed.

## 2019-01-29 ENCOUNTER — Telehealth (HOSPITAL_COMMUNITY): Payer: Self-pay

## 2019-01-29 ENCOUNTER — Other Ambulatory Visit (HOSPITAL_COMMUNITY): Payer: Self-pay | Admitting: Nurse Practitioner

## 2019-01-29 ENCOUNTER — Telehealth: Payer: Self-pay

## 2019-01-29 MED ORDER — OMEPRAZOLE 20 MG PO CPDR: 20.0000 mg | DELAYED_RELEASE_CAPSULE | Freq: Every day | ORAL | 3 refills | Status: DC

## 2019-01-29 NOTE — Telephone Encounter (Signed)
Connected with patient by phone. She was seen in the ER and hospitalized briefly. She had chest pressure and tightness.  Troponins were elevated.   Cardiology was consulted who reviewed echocardiogram and recent stress test.  It was not felt that she was having an acute myocardial infarction.  COVID test was negative She has had some increasing reflux and had been off PPI for about 6 months. Mild constipation post colonoscopy recently and MiraLAX was advised  Today she is feeling much better.  No further chest pressure.  Pantoprazole was recommended but in the past omeprazole had worked well.  She wonders what she should take.  Plan: Begin omeprazole 20 mg daily.  I advised her to take this about 30 minutes before her evening meal.  This will separated from her Synthroid.  She should let me know if this is ineffective Continue MiraLAX 17 g a day until bowel movements are more regular.  Once regular she can use as needed

## 2019-01-29 NOTE — Telephone Encounter (Signed)
Please see note below.  Pt would like permission to take pantoprazole until she gets script for omeprazole.

## 2019-01-29 NOTE — Telephone Encounter (Signed)
62f 83.1kg Scr 0.68 01/28/19 Lovw/turner 12/25/18

## 2019-01-29 NOTE — Telephone Encounter (Signed)
  Follow up Call-  Call back number 01/25/2019  Post procedure Call Back phone  # [phone]  Permission to leave phone message Yes  Some recent data might be hidden     Patient questions:  Do you have a fever, pain , or abdominal swelling? No. Pain Score  0 *  Have you tolerated food without any problems? Yes.    Have you been able to return to your normal activities? No.  Do you have any questions about your discharge instructions: Diet   No. Medications  Yes.   Follow up visit  No.  Do you have questions or concerns about your Care? Yes.     Saturday night pt went to bed, put on her Bi-pap and developed tightness across bilateral breasts.  She was transported to [facility] via EMS.  Pt reported that troponin level was increasing but her heart checked out fine.  The hospitalist, Dr. Algis Liming thinks the pressure inside was caused from the air in her colon from the colonoscopy 01-25-2019.  He felt it was a combination of issues.  Maybe reflux sx involved also.  He prescribed pt to take PANTOPRAZOL 40 mg for at least 10 days.  Pt has not picked up rx yet, not sure if qd or bid.  She took pantoprazole she had at home and she said it has helped her since she was having some reflux sx..  Pt said she did not want to take any meds that she did not need.  Wants your advise on this matter.  If she should continue Pantoprazole or omeprazole 20 mg or 40 mg.  Pt said she had a good BM and the pressure was gone by Sunday afternoon.  Pt was d/c to home.  She would really like to speak with you personally.  Actions: * If pain score is 4 or above: No action needed, pain <4.  1. Have you developed a fever since your procedure? no  2.   Have you had an respiratory symptoms (SOB or cough) since your procedure? no  3.   Have you tested positive for COVID 19 since your procedure no  4.   Have you had any family members/close contacts diagnosed with the COVID 19 since your procedure?  no   If yes to any  of these questions please route to Joylene John, RN and Alphonsa Gin, Therapist, sports.

## 2019-01-31 ENCOUNTER — Telehealth: Payer: Self-pay | Admitting: Internal Medicine

## 2019-01-31 NOTE — Telephone Encounter (Signed)
Discussed with pt that she will receive a letter in the mail when her pathology results are back.

## 2019-02-02 ENCOUNTER — Encounter: Payer: Self-pay | Admitting: Podiatry

## 2019-02-02 ENCOUNTER — Ambulatory Visit (INDEPENDENT_AMBULATORY_CARE_PROVIDER_SITE_OTHER): Payer: Medicare Other | Admitting: Podiatry

## 2019-02-02 ENCOUNTER — Other Ambulatory Visit: Payer: Self-pay

## 2019-02-02 DIAGNOSIS — Z7901 Long term (current) use of anticoagulants: Secondary | ICD-10-CM

## 2019-02-02 DIAGNOSIS — B351 Tinea unguium: Secondary | ICD-10-CM

## 2019-02-02 DIAGNOSIS — M79674 Pain in right toe(s): Secondary | ICD-10-CM

## 2019-02-02 DIAGNOSIS — L84 Corns and callosities: Secondary | ICD-10-CM

## 2019-02-02 DIAGNOSIS — M79675 Pain in left toe(s): Secondary | ICD-10-CM

## 2019-02-02 NOTE — Patient Instructions (Signed)

## 2019-02-05 NOTE — Progress Notes (Signed)
Cardiology Office Note:    Date:  02/06/2019   ID:  Kimberly [name], DOB Feb 04, 1941, MRN [id number]  PCP:  Kimberly Manes, MD  Cardiologist:  Kimberly Him, MD    Referring MD: Kimberly Manes, MD   Chief Complaint  Patient presents with  . Atrial Fibrillation  . Chest Pain    History of Present Illness:    Kimberly [name] is a 78 y.o. female with a hx of PAF on metoprolol and Eliquis for chads vas score of 5. Metoprolol decreased by [facility] because of bradycardia.She also has a historyof chest pain with normal nuclear stress testandsyncope felt secondary to dehydration. She has a chronic RBBB with no ischemia on stress test a year ago.   I recently saw her with complaints of intermittent CP that occurs with exertion.  Coronary CTA showed a calcium score of 0 and no evidence of CAD.  She presented to the er on 8/30 with recurrent CP that occurred as a band around under her breasts that was 5/10 without radiation and associated with pressure.  EMS was called and noted her HR to be in the 200's in afib.  She received NTG with improvement in pain.  Pain lasted 30 minutes but was not associated with any other sx. Initial trop in ER was normal at 11 but trended up to 135.   It was felt that there may be an anxiety component to the pain.  2D echo showed normal LVF with no PE.  It was felt that she may have GERD and had gone off PPI.  She was placed back on her PPI.    She is here today for followup and is doing well.  she denies any chest pain or pressure, SOB, DOE, PND, orthopnea, LE edema, dizziness, palpitations or syncope. She is compliant with her meds and is tolerating meds with no SE.    Past Medical History:  Diagnosis Date  . Allergy   . Anal polyp 1998   Flex Sig   . Anemia   . Angular blepharitis of left eye   . Ankle fracture    Stress fracture  . Anxiety   . Asthma    border line has inhaler  . Bunion   . Cataracts, bilateral   . Corneal scar    left eye  .  Degenerative disc disease   . Depression   . Diverticulosis of colon (without mention of hemorrhage) 2010   Colonoscopy  . Dry eyes    bilateral  . Enterocele   . External hemorrhoids 2000   Colonoscopy  . Family history of malignant neoplasm of gastrointestinal tract   . Female cystocele   . Fibromyalgia   . GERD (gastroesophageal reflux disease)   . Hiatal hernia 2005,2010   EGD  . History of bronchitis   . History of measles   . History of mumps   . History of strep sore throat   . History of urinary tract infection   . Hyperlipemia   . Hypothyroidism   . IBS (irritable bowel syndrome)   . Imbalance   . Internal hemorrhoids without mention of complication 0000000   Colonoscopy   . Itching   . 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 Menopause   . Migraine   . OSA (obstructive sleep apnea) 05/14/2015   on BiPAP  . Osteopenia 10/2013   T score -1.6 FRAX 10%/1.6%  . PAF (paroxysmal atrial fibrillation) ([place])   . Pancreatitis   . PCO (  posterior capsular opacification)    left  . Pneumonia    childhood illness  . PONV (postoperative nausea and vomiting)   . Pseudophakia, both eyes   . PVD (posterior vitreous detachment) right  . Rash    on back   . Retinal scar    left  . Rotator cuff disorder    pain, left shoulder  . Shingles   . Stricture and stenosis of esophagus 2005,2010   EGD   . Stroke ([place])   . Ulcerative colitis ([place])   . Varicose veins   . Vertigo   . Wears glasses     Past Surgical History:  Procedure Laterality Date  . ABDOMINAL HYSTERECTOMY    . ANAL FISSURE REPAIR    . BREAST EXCISIONAL BIOPSY Left   . BUNIONECTOMY  2014  . CATARACT EXTRACTION Bilateral 2013  . COLONOSCOPY     polyp removed  . ESOPHAGEAL DILATION    . EYE SURGERY Left   . FOOT SURGERY     left   . HEMORRHOID SURGERY    . MOUTH SURGERY  11/13/11   Cyst removed from gum-benign   . NASAL SEPTUM SURGERY    . NASAL SEPTUM SURGERY  1970's  . REPLACEMENT TOTAL KNEE Right 2011  .  TONSILLECTOMY    . TOTAL ABDOMINAL HYSTERECTOMY W/ BILATERAL SALPINGOOPHORECTOMY  1991   TAH BSO  . TOTAL HIP ARTHROPLASTY     right   . TOTAL KNEE ARTHROPLASTY     both  . TOTAL KNEE ARTHROPLASTY Left 06/23/2015   Procedure: LEFT TOTAL KNEE ARTHROPLASTY;  Surgeon: Gaynelle Arabian, MD;  Location: [facility];  Service: Orthopedics;  Laterality: Left;    Current Medications: Current Meds  Medication Sig  . acetaminophen (TYLENOL) 500 MG tablet Take 500 mg by mouth every 6 (six) hours as needed for moderate pain.   Marland Kitchen apixaban (ELIQUIS) 5 MG TABS tablet Take 1 tablet (5 mg total) by mouth 2 (two) times daily.  Marland Kitchen diltiazem (CARDIZEM CD) 120 MG 24 hr capsule TAKE 1 CAPSULE (120 MG TOTAL) BY MOUTH AT BEDTIME.  Marland Kitchen EPINEPHrine 0.3 mg/0.3 mL IJ SOAJ injection Inject 0.3 mg into the muscle as needed (for allergic reaction). Reported on 05/28/2015   . fluticasone (FLONASE) 50 MCG/ACT nasal spray Place 2 sprays into both nostrils 2 (two) times daily as needed for allergies.   Marland Kitchen levothyroxine (SYNTHROID) 100 MCG tablet Take 100 mcg by mouth daily before breakfast.  . omeprazole (PRILOSEC) 20 MG capsule Take 1 capsule (20 mg total) by mouth daily.  . ondansetron (ZOFRAN-ODT) 4 MG disintegrating tablet Take 1 tablet (4 mg total) by mouth every 8 (eight) hours as needed for nausea or vomiting.  . Polyethylene Glycol 3350 (MIRALAX PO) Take 17 g by mouth daily as needed (constipation).   . Polyvinyl Alcohol-Povidone (REFRESH OP) Apply 1-2 drops to eye daily as needed (Dry eyes).   Marland Kitchen PROAIR HFA 108 (90 Base) MCG/ACT inhaler Inhale 1-2 puffs into the lungs every 6 (six) hours as needed for wheezing.   . Probiotic Product (ALIGN) 4 MG CAPS Take 1 capsule by mouth daily.  . RESTASIS 0.05 % ophthalmic emulsion Place 1 drop into both eyes 2 (two) times daily. Uses as directed  . rosuvastatin (CRESTOR) 10 MG tablet Take 5 mg by mouth daily.      Allergies:   Azithromycin, Erythromycin, Almond oil, Amoxicillin-pot  clavulanate, Atorvastatin, Codeine, Ketoconazole, Ketoconazole, Latex, Lincomycin, Morphine, Morphine sulfate, Other, Shellfish allergy, Simvastatin, Sulfamethoxazole-trimethoprim, Clindamycin, Clindamycin/lincomycin, and  Erythromycin base   Social History   Socioeconomic History  . Marital status: Married    Spouse name: Not on file  . Number of children: 1  . Years of education: bus.school  . Highest education level: Not on file  Occupational History  . Occupation: Retired    Fish farm manager: RETIRED  Social Needs  . Financial resource strain: Not on file  . Food insecurity    Worry: Not on file    Inability: Not on file  . Transportation needs    Medical: Not on file    Non-medical: Not on file  Tobacco Use  . Smoking status: Former Smoker    Packs/day: 0.25    Years: 15.00    Pack years: 3.75    Types: Cigarettes    Quit date: 06/01/1975    Years since quitting: 43.7  . Smokeless tobacco: Never Used  . Tobacco comment: Stopped smoking age 17  Substance and Sexual Activity  . Alcohol use: No    Alcohol/week: 0.0 standard drinks  . Drug use: No  . Sexual activity: Never    Birth control/protection: Surgical    Comment: HYST, intecourse age unknown, sexual partner less than 5  Lifestyle  . Physical activity    Days per week: Not on file    Minutes per session: Not on file  . Stress: Not on file  Relationships  . Social Herbalist on phone: Not on file    Gets together: Not on file    Attends religious service: Not on file    Active member of club or organization: Not on file    Attends meetings of clubs or organizations: Not on file    Relationship status: Not on file  Other Topics Concern  . Not on file  Social History Narrative   Daily caffeine      Family History: The patient's family history includes Alcohol abuse in her father; Allergies in her sister; Allergy (severe) in her sister; Alzheimer's disease in her mother; Arthritis in her brother; Breast  cancer (age of onset: 49) in her sister; Colon cancer in her paternal aunt; Colon polyps in her brother and sister; Congestive Heart Failure in her father; Dementia in her mother; Diabetes in her sister; Heart attack in her maternal grandfather, maternal grandmother, and paternal grandfather; Heart disease in her maternal grandmother; Hypertension in her father; Irritable bowel syndrome in her mother; Other in her sister and sister; Stroke in her mother. There is no history of Esophageal cancer, Rectal cancer, or Stomach cancer.  ROS:   Please see the history of present illness.    ROS  All other systems reviewed and negative.   EKGs/Labs/Other Studies Reviewed:    The following studies were reviewed today: 2D echo  EKG:  EKG is not ordered today.   Recent Labs: 12/04/2018: ALT 15 01/28/2019: BUN 15; Creatinine, Ser 0.68; Hemoglobin 12.7; Platelets 210; Potassium 3.5; Sodium 138   Recent Lipid Panel    Component Value Date/Time   CHOL 124 07/23/2013 0632   TRIG 69 07/23/2013 0632   HDL 68 07/23/2013 0632   CHOLHDL 1.8 07/23/2013 0632   VLDL 14 07/23/2013 0632   LDLCALC 42 07/23/2013 0632    Physical Exam:    VS:  BP 126/74   Pulse 62   Ht 5\' 6"  (1.676 m)   Wt 178 lb 12.8 oz (81.1 kg)   LMP  (LMP Unknown)   SpO2 98%   BMI 28.86 kg/m  Wt Readings from Last 3 Encounters:  02/06/19 178 lb 12.8 oz (81.1 kg)  01/28/19 178 lb (80.7 kg)  01/25/19 183 lb (83 kg)     GEN:  Well nourished, well developed in no acute distress HEENT: Normal NECK: No JVD; No carotid bruits LYMPHATICS: No lymphadenopathy CARDIAC: RRR, no murmurs, rubs, gallops RESPIRATORY:  Clear to auscultation without rales, wheezing or rhonchi  ABDOMEN: Soft, non-tender, non-distended MUSCULOSKELETAL:  No edema; No deformity  SKIN: Warm and dry NEUROLOGIC:  Alert and oriented x 3 PSYCHIATRIC:  Normal affect   ASSESSMENT:    1. Chest pain of uncertain etiology   2. PAF (paroxysmal atrial fibrillation)  ([place])   3. Syncope, unspecified syncope type    PLAN:    In order of problems listed above:  1.  PAF  -apparently had another episode of PAF recently in setting of CP and EMS found her to have a HR of 200. -continue on Cardizem CD but increase to 180mg  daily.   -continue ELiquis 5mg  BID.  2.  Chest pain -noncardiac in origin -coronary CTA with calcium score 0 and no CAD -possibly related to GERD -back on PPI -recommend followup with PCP  3.  Syncope -no further episodes -felt related to dehydration in the past   Medication Adjustments/Labs and Tests Ordered: Current medicines are reviewed at length with the patient today.  Concerns regarding medicines are outlined above.  No orders of the defined types were placed in this encounter.  No orders of the defined types were placed in this encounter.   Signed, Kimberly Him, MD  02/06/2019 2:00 PM    [place]

## 2019-02-06 ENCOUNTER — Other Ambulatory Visit: Payer: Self-pay

## 2019-02-06 ENCOUNTER — Other Ambulatory Visit (HOSPITAL_COMMUNITY): Payer: Self-pay | Admitting: Nurse Practitioner

## 2019-02-06 ENCOUNTER — Encounter: Payer: Self-pay | Admitting: Cardiology

## 2019-02-06 ENCOUNTER — Ambulatory Visit (INDEPENDENT_AMBULATORY_CARE_PROVIDER_SITE_OTHER): Payer: Medicare Other | Admitting: Cardiology

## 2019-02-06 VITALS — BP 126/74 | HR 62 | Ht 66.0 in | Wt 178.8 lb

## 2019-02-06 DIAGNOSIS — R079 Chest pain, unspecified: Secondary | ICD-10-CM

## 2019-02-06 DIAGNOSIS — R55 Syncope and collapse: Secondary | ICD-10-CM

## 2019-02-06 DIAGNOSIS — R0789 Other chest pain: Secondary | ICD-10-CM

## 2019-02-06 DIAGNOSIS — I480 Paroxysmal atrial fibrillation: Secondary | ICD-10-CM | POA: Diagnosis not present

## 2019-02-06 MED ORDER — DILTIAZEM HCL ER COATED BEADS 180 MG PO CP24: 180.0000 mg | ORAL_CAPSULE | Freq: Every day | ORAL | 3 refills | Status: DC

## 2019-02-06 NOTE — Patient Instructions (Signed)
Medication Instructions:  1) INCREASE Cardizem to 180mg  once daily  If you need a refill on your cardiac medications before your next appointment, please call your pharmacy.   Lab work: None If you have labs (blood work) drawn today and your tests are completely normal, you will receive your results only by: Marland Kitchen MyChart Message (if you have MyChart) OR . A paper copy in the mail If you have any lab test that is abnormal or we need to change your treatment, we will call you to review the results.  Testing/Procedures: None  Follow-Up:  Your physician recommends that you schedule a follow-up appointment in: 6 weeks with a PA or NP.   At [facility], you and your health needs are our priority.  As part of our continuing mission to provide you with exceptional heart care, we have created designated Provider Care Teams.  These Care Teams include your primary Cardiologist (physician) and Advanced Practice Providers (APPs -  Physician Assistants and Nurse Practitioners) who all work together to provide you with the care you need, when you need it. You will need a follow up appointment in 6 months.  Please call our office 2 months in advance to schedule this appointment.  You may see Fransico Him, MD or one of the following Advanced Practice Providers on your designated Care Team:   [place], PA-C Melina Copa, PA-C . Ermalinda Barrios, PA-C  Any Other Special Instructions Will Be Listed Below (If Applicable).

## 2019-02-06 NOTE — Telephone Encounter (Signed)
Left message for patient to call back  

## 2019-02-06 NOTE — Telephone Encounter (Signed)
Pt returned phone call to [facility].

## 2019-02-06 NOTE — Telephone Encounter (Signed)
Spoke to patient who is extremely anxious about results of pathology reports having been hospitalized. Patient told Dr. Hilarie Fredrickson would review and she would be called with the results or sent a letter. Patient verbalized understanding and frustration.

## 2019-02-06 NOTE — Telephone Encounter (Signed)
Pt is requesting to speak with someone about colon path results states does not feel comfortable just getting a letter.

## 2019-02-07 ENCOUNTER — Ambulatory Visit
Admission: RE | Admit: 2019-02-07 | Discharge: 2019-02-07 | Disposition: A | Discharge status: Home / Self Care | Payer: Medicare Other | Source: Ambulatory Visit | Attending: Gynecology | Admitting: Gynecology

## 2019-02-07 ENCOUNTER — Other Ambulatory Visit: Payer: Self-pay | Admitting: Geriatric Medicine

## 2019-02-07 DIAGNOSIS — Z1231 Encounter for screening mammogram for malignant neoplasm of breast: Secondary | ICD-10-CM

## 2019-02-07 DIAGNOSIS — M8580 Other specified disorders of bone density and structure, unspecified site: Secondary | ICD-10-CM

## 2019-02-07 NOTE — Progress Notes (Signed)
Subjective: Kimberly [name] presents to clinic with cc of painful mycotic toenails and callus left foot which are aggravated when weightbearing with and without shoe gear.  This pain limits her daily activities. Pain symptoms resolve with periodic professional debridement.  She is also on blood thinner, Eliquis.  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   Lajean Manes, MD is her PCP.    Current Outpatient Medications:  .  acetaminophen (TYLENOL) 500 MG tablet, Take 500 mg by mouth every 6 (six) hours as needed for moderate pain. , Disp: , Rfl:  .  apixaban (ELIQUIS) 5 MG TABS tablet, Take 1 tablet (5 mg total) by mouth 2 (two) times daily., Disp: 180 tablet, Rfl: 2 .  diltiazem (CARDIZEM CD) 180 MG 24 hr capsule, Take 1 capsule (180 mg total) by mouth daily., Disp: 90 capsule, Rfl: 3 .  EPINEPHrine 0.3 mg/0.3 mL IJ SOAJ injection, Inject 0.3 mg into the muscle as needed (for allergic reaction). Reported on 05/28/2015 , Disp: , Rfl:  .  fluticasone (FLONASE) 50 MCG/ACT nasal spray, Place 2 sprays into both nostrils 2 (two) times daily as needed for allergies. , Disp: , Rfl:  .  levothyroxine (SYNTHROID) 100 MCG tablet, Take 100 mcg by mouth daily before breakfast., Disp: , Rfl:  .  omeprazole (PRILOSEC) 20 MG capsule, Take 1 capsule (20 mg total) by mouth daily., Disp: 90 capsule, Rfl: 3 .  ondansetron (ZOFRAN-ODT) 4 MG disintegrating tablet, Take 1 tablet (4 mg total) by mouth every 8 (eight) hours as needed for nausea or vomiting., Disp: 8 tablet, Rfl: 0 .  Polyethylene Glycol 3350 (MIRALAX PO), Take 17 g by mouth daily as needed (constipation). , Disp: , Rfl:  .  Polyvinyl Alcohol-Povidone (REFRESH OP), Apply 1-2 drops to eye daily as needed (Dry eyes). , Disp: , Rfl:  .  PROAIR HFA 108 (90 Base) MCG/ACT inhaler, Inhale 1-2 puffs into the lungs every 6 (six) hours as needed for wheezing. , Disp: , Rfl: 0 .  Probiotic Product (ALIGN) 4 MG  CAPS, Take 1 capsule by mouth daily., Disp: , Rfl:  .  RESTASIS 0.05 % ophthalmic emulsion, Place 1 drop into both eyes 2 (two) times daily. Uses as directed, Disp: , Rfl:  .  rosuvastatin (CRESTOR) 10 MG tablet, Take 5 mg by mouth daily. , Disp: , Rfl: 4   Allergies  Allergen Reactions  . Azithromycin Other (See Comments)    Pancreatitis  . Erythromycin Hives    Reaction 1 time, when she was younger. Reaction 1 time, when she was younger.   Toniann Ket Other (See Comments)    Almonds: per allergy testing.  . Amoxicillin-Pot Clavulanate Nausea And Vomiting and Nausea Only    Has patient had a PCN reaction causing immediate rash, facial/tongue/throat swelling, SOB or lightheadedness with hypotension: No Has patient had a PCN reaction causing severe rash involving mucus membranes or skin necrosis: No Has patient had a PCN reaction that required hospitalization No Has patient had a PCN reaction occurring within the last 10 years: No If all of the above answers are "NO", then may proceed with Cephalosporin use.   . Atorvastatin Other (See Comments)    Muscle weakness  Muscle weakness   . Codeine Nausea Only  . Ketoconazole Nausea And Vomiting  . Ketoconazole Nausea Only and Nausea And Vomiting  . Latex Other (See Comments)    Other  . Lincomycin  Stomach upset   . Morphine Nausea Only  . Morphine Sulfate Nausea Only and Other (See Comments)      Unknown  . Other Other (See Comments)    Shellfish mix: per allergy testing.  . Shellfish Allergy Other (See Comments)    Shellfish mix: per allergy testing.  . Simvastatin Other (See Comments)    Muscle pain  . Sulfamethoxazole-Trimethoprim Nausea Only    Upset stomach   . Clindamycin Rash  . Clindamycin/Lincomycin Nausea And Vomiting and Rash  . Erythromycin Base Rash     Objective: Physical Examination:  Vascular  Examination: Capillary refill time immediate x 10 digits.  Palpable DP/PT pulses b/l.  Digital hair  present b/l.  No edema noted b/l.  Skin temperature gradient WNL b/l.  Dermatological Examination: Skin with normal turgor, texture and tone b/l.  No open wounds b/l.  No interdigital macerations noted b/l.  Hyperkeratotic lesion plantar aspect left foot. No erythema, no edema, no drainage, no flocculence.  Elongated, thick, discolored brittle toenails with subungual debris and pain on dorsal palpation of nailbeds 1-5 b/l.  Musculoskeletal Examination: Muscle strength 5/5 to all muscle groups b/l.  Hammertoes 2-5 b/l.  No pain, crepitus or joint discomfort with active/passive ROM.  Neurological Examination: Sensation intact 5/5 b/l with 10 gram monofilament.  Vibratory sensation intact b/l.  Proprioceptive sensation intact b/l.  Assessment: 1. Mycotic nail infection with pain 1-5 b/l 2. Callus plantar left foot 3. Pt on long term blood thinner  Plan: 1. Toenails 1-5 b/l were debrided in length and girth without iatrogenic laceration. 2. Calluses pared plantar left foot utilizing sterile scalpel blade without incident. 3. Continue soft, supportive shoe gear daily. 4. Report any pedal injuries to medical professional. 5. Follow up 9 weeks. 6. Patient/POA to call should there be a question/concern in there interim.

## 2019-02-08 ENCOUNTER — Encounter: Payer: Self-pay | Admitting: Internal Medicine

## 2019-02-08 MED ORDER — DILTIAZEM HCL ER COATED BEADS 180 MG PO CP24: 180.0000 mg | ORAL_CAPSULE | Freq: Every day | ORAL | 3 refills | Status: DC

## 2019-02-09 ENCOUNTER — Telehealth: Payer: Self-pay | Admitting: Internal Medicine

## 2019-02-09 NOTE — Telephone Encounter (Signed)
Result letter reviewed with pt and questions were answered. 

## 2019-02-09 NOTE — Telephone Encounter (Signed)
Pt requested a call back to discuss pathology results.  °

## 2019-03-08 ENCOUNTER — Encounter: Payer: Self-pay | Admitting: Gynecology

## 2019-03-21 NOTE — Progress Notes (Signed)
CARDIOLOGY OFFICE NOTE  Date:  03/28/2019    Kimberly [name] Date of Birth: 04-20-1941 Medical Record [id number]  PCP:  Lajean Manes, MD  Cardiologist:  Radford Pax  Chief Complaint  Patient presents with  . Follow-up    History of Present Illness: Kimberly [name] is a 78 y.o. female who presents today for a follow up visit. Seen for Dr. Radford Pax.   She has a history of PAF on metoprolol and Eliquis with a CHADSVASC score of 5. Metoprolol decreased by [facility] because of bradycardia.She also has a historyof chest pain with normal nuclear stress testandsyncope felt secondary to dehydration. She has a chronic RBBB with no ischemia on prior stress test.    Seen in follow up last month by Dr. Radford Pax. Had had chest pain earlier in the year and and had cardiac CT - no evidence of CAD noted. She was doing well at her last visit but had had recurrent AF noted with an ER visit in late August - CCB dose was titrated up.    The patient does not have symptoms concerning for COVID-19 infection (fever, chills, cough, or new shortness of breath).   Comes in today. Here alone. She notes she feels more weakness with the increased dose of her CCB. There is lots of stress at home - husband is having lots of issues. She is worried she will have recurrent AF with what all is going on. She will note a sensation sometimes in the right neck area - feels like this is anxiety. She remains on her Eliquis - some bruising. She is using bipap - has caused some skin changes.   Past Medical History:  Diagnosis Date  . Allergy   . Anal polyp 1998   Flex Sig   . Anemia   . Angular blepharitis of left eye   . Ankle fracture    Stress fracture  . Anxiety   . Asthma    border line has inhaler  . Bunion   . Cataracts, bilateral   . Corneal scar    left eye  . Degenerative disc disease   . Depression   . Diverticulosis of colon (without mention of hemorrhage) 2010   Colonoscopy  . Dry  eyes    bilateral  . Enterocele   . External hemorrhoids 2000   Colonoscopy  . Family history of malignant neoplasm of gastrointestinal tract   . Female cystocele   . Fibromyalgia   . GERD (gastroesophageal reflux disease)   . Hiatal hernia 2005,2010   EGD  . History of bronchitis   . History of measles   . History of mumps   . History of strep sore throat   . History of urinary tract infection   . Hyperlipemia   . Hypothyroidism   . IBS (irritable bowel syndrome)   . Imbalance   . Internal hemorrhoids without mention of complication 0000000   Colonoscopy   . Itching   . 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 Menopause   . Migraine   . OSA (obstructive sleep apnea) 05/14/2015   on BiPAP  . Osteopenia 10/2013   T score -1.6 FRAX 10%/1.6%  . PAF (paroxysmal atrial fibrillation) ([place])   . Pancreatitis   . PCO (posterior capsular opacification)    left  . Pneumonia    childhood illness  . PONV (postoperative nausea and vomiting)   . Pseudophakia, both eyes   . PVD (posterior vitreous detachment) right  . Rash  on back   . Retinal scar    left  . Rotator cuff disorder    pain, left shoulder  . Shingles   . Stricture and stenosis of esophagus 2005,2010   EGD   . Stroke ([place])   . Ulcerative colitis ([place])   . Varicose veins   . Vertigo   . Wears glasses     Past Surgical History:  Procedure Laterality Date  . ABDOMINAL HYSTERECTOMY    . ANAL FISSURE REPAIR    . BREAST EXCISIONAL BIOPSY Left   . BUNIONECTOMY  2014  . CATARACT EXTRACTION Bilateral 2013  . COLONOSCOPY     polyp removed  . ESOPHAGEAL DILATION    . EYE SURGERY Left   . FOOT SURGERY     left   . HEMORRHOID SURGERY    . MOUTH SURGERY  11/13/11   Cyst removed from gum-benign   . NASAL SEPTUM SURGERY    . NASAL SEPTUM SURGERY  1970's  . REPLACEMENT TOTAL KNEE Right 2011  . TONSILLECTOMY    . TOTAL ABDOMINAL HYSTERECTOMY W/ BILATERAL SALPINGOOPHORECTOMY  1991   TAH BSO  . TOTAL HIP ARTHROPLASTY      right   . TOTAL KNEE ARTHROPLASTY     both  . TOTAL KNEE ARTHROPLASTY Left 06/23/2015   Procedure: LEFT TOTAL KNEE ARTHROPLASTY;  Surgeon: Gaynelle Arabian, MD;  Location: [facility];  Service: Orthopedics;  Laterality: Left;     Medications: Current Meds  Medication Sig  . acetaminophen (TYLENOL) 500 MG tablet Take 500 mg by mouth every 6 (six) hours as needed for moderate pain.   Marland Kitchen apixaban (ELIQUIS) 5 MG TABS tablet Take 1 tablet (5 mg total) by mouth 2 (two) times daily.  Marland Kitchen diltiazem (CARDIZEM CD) 180 MG 24 hr capsule Take 1 capsule (180 mg total) by mouth daily.  Marland Kitchen EPINEPHrine 0.3 mg/0.3 mL IJ SOAJ injection Inject 0.3 mg into the muscle as needed (for allergic reaction). Reported on 05/28/2015   . fluticasone (FLONASE) 50 MCG/ACT nasal spray Place 2 sprays into both nostrils 2 (two) times daily as needed for allergies.   Marland Kitchen levothyroxine (SYNTHROID) 100 MCG tablet Take 100 mcg by mouth daily before breakfast.  . omeprazole (PRILOSEC) 20 MG capsule Take 20 mg by mouth as needed. For heartburn  . ondansetron (ZOFRAN-ODT) 4 MG disintegrating tablet Take 1 tablet (4 mg total) by mouth every 8 (eight) hours as needed for nausea or vomiting.  . Polyethylene Glycol 3350 (MIRALAX PO) Take 17 g by mouth daily as needed (constipation).   Marland Kitchen PROAIR HFA 108 (90 Base) MCG/ACT inhaler Inhale 1-2 puffs into the lungs every 6 (six) hours as needed for wheezing.   . Probiotic Product (ALIGN) 4 MG CAPS Take 1 capsule by mouth daily.  . RESTASIS 0.05 % ophthalmic emulsion Place 1 drop into both eyes 2 (two) times daily. Uses as directed  . rosuvastatin (CRESTOR) 10 MG tablet Take 5 mg by mouth daily.      Allergies: Allergies  Allergen Reactions  . Azithromycin Other (See Comments)    Pancreatitis  . Erythromycin Hives    Reaction 1 time, when she was younger. Reaction 1 time, when she was younger.   Toniann Ket Other (See Comments)    Almonds: per allergy testing.  . Amoxicillin-Pot Clavulanate  Nausea And Vomiting and Nausea Only    Has patient had a PCN reaction causing immediate rash, facial/tongue/throat swelling, SOB or lightheadedness with hypotension: No Has patient had a PCN  reaction causing severe rash involving mucus membranes or skin necrosis: No Has patient had a PCN reaction that required hospitalization No Has patient had a PCN reaction occurring within the last 10 years: No If all of the above answers are "NO", then may proceed with Cephalosporin use.   . Atorvastatin Other (See Comments)    Muscle weakness  Muscle weakness   . Codeine Nausea Only  . Ketoconazole Nausea And Vomiting  . Ketoconazole Nausea Only and Nausea And Vomiting  . Latex Other (See Comments)    Other  . Lincomycin     Stomach upset   . Morphine Nausea Only  . Morphine Sulfate Nausea Only and Other (See Comments)      Unknown  . Other Other (See Comments)    Shellfish mix: per allergy testing.  . Shellfish Allergy Other (See Comments)    Shellfish mix: per allergy testing.  . Simvastatin Other (See Comments)    Muscle pain  . Sulfamethoxazole-Trimethoprim Nausea Only    Upset stomach   . Clindamycin Rash  . Clindamycin/Lincomycin Nausea And Vomiting and Rash  . Erythromycin Base Rash    Social History: The patient  reports that she quit smoking about 43 years ago. Her smoking use included cigarettes. She has a 3.75 pack-year smoking history. She has never used smokeless tobacco. She reports that she does not drink alcohol or use drugs.   Family History: The patient's family history includes Alcohol abuse in her father; Allergies in her sister; Allergy (severe) in her sister; Alzheimer's disease in her mother; Arthritis in her brother; Breast cancer (age of onset: 65) in her sister; Colon cancer in her paternal aunt; Colon polyps in her brother and sister; Congestive Heart Failure in her father; Dementia in her mother; Diabetes in her sister; Heart attack in her maternal  grandfather, maternal grandmother, and paternal grandfather; Heart disease in her maternal grandmother; Hypertension in her father; Irritable bowel syndrome in her mother; Other in her sister and sister; Stroke in her mother.   Review of Systems: Please see the history of present illness.   All other systems are reviewed and negative.   Physical Exam: VS:  BP 122/70   Pulse 60   Ht 5\' 6"  (1.676 m)   Wt 180 lb 12.8 oz (82 kg)   LMP  (LMP Unknown)   SpO2 100%   BMI 29.18 kg/m  .  BMI Body mass index is 29.18 kg/m.  Wt Readings from Last 3 Encounters:  03/28/19 180 lb 12.8 oz (82 kg)  02/06/19 178 lb 12.8 oz (81.1 kg)  01/28/19 178 lb (80.7 kg)    General: Pleasant. Alert and in no acute distress. A little anxious.    HEENT: Normal.  Neck: Supple, no JVD, carotid bruits, or masses noted.  Cardiac: Regular rate and rhythm. No murmurs, rubs, or gallops. No edema.  Respiratory:  Lungs are clear to auscultation bilaterally with normal work of breathing.  GI: Soft and nontender.  MS: No deformity or atrophy. Gait and ROM intact.  Skin: Warm and dry. Color is normal.  Neuro:  Strength and sensation are intact and no gross focal deficits noted.  Psych: Alert, appropriate and with normal affect.   LABORATORY DATA:  EKG:  EKG is not ordered today.  Lab Results  Component Value Date   WBC 6.3 01/28/2019   HGB 12.7 01/28/2019   HCT 38.5 01/28/2019   PLT 210 01/28/2019   GLUCOSE 105 (H) 01/28/2019   CHOL 124 07/23/2013  TRIG 69 07/23/2013   HDL 68 07/23/2013   LDLCALC 42 07/23/2013   ALT 15 12/04/2018   AST 21 12/04/2018   NA 138 01/28/2019   K 3.5 01/28/2019   CL 101 01/28/2019   CREATININE 0.68 01/28/2019   BUN 15 01/28/2019   CO2 25 01/28/2019   TSH 0.25 (L) 02/01/2012   INR 1.08 06/17/2015       BNP (last 3 results) No results for input(s): BNP in the last 8760 hours.  ProBNP (last 3 results) No results for input(s): PROBNP in the last 8760 hours.   Other  Studies Reviewed Today:   ECHO IMPRESSIONS 12/2018   1. The left ventricle has normal systolic function, with an ejection fraction of 55-60%. The cavity size was normal. Left ventricular diastolic Doppler parameters are indeterminate. Indeterminate filling pressures.  2. The right ventricle has normal systolic function. The cavity was normal. There is no increase in right ventricular wall thickness. Right ventricular systolic pressure is normal with an estimated pressure of 33.8 mmHg.  3. Left atrial size was mildly dilated.  4. Mild thickening of the mitral valve leaflet. No evidence of mitral valve stenosis.  5. The aortic valve is tricuspid. No stenosis of the aortic valve.  6. The aorta is normal unless otherwise noted.  7. The inferior vena cava was dilated in size with >50% respiratory variability.   MyoviewStudy Highlights 07/2017    Nuclear stress EF: 68%.  There was no ST segment deviation noted during stress.  The study is normal.  This is a low risk study.  The left ventricular ejection fraction is hyperdynamic (>65%).   Normal pharmacologic nuclear stress test with no evidence for prior infarct or ischemia. Normal LVEF.    Cardiac Telemetry Study Highlights 05/2017   Normal sinus rhythm - average heart rate 64.7bpm. The heart rate ranged from 40-136bpm.  Paroxysmal atrial fibrillation with RVR up to 136bpm.     Assessment/Plan:  1. PAF - prior recurrence which necessitated increasing her CCB dose. Remains on anticoagulation. Some leg weakness with this dose but she does not wish to cut her dose back. We did talk about using an extra dose of her 120 mg prn.   2. Chronic anticoagulation - no problems noted. Some bruising. Not excessive.   3. Non cardiac chest pain - no CAD per prior CT  4. History of syncope - has not recurred.  5. HLD - on statin - per PCP  6. COVID-19 Education: The signs and symptoms of COVID-19 were discussed with the patient and how  to seek care for testing (follow up with PCP or arrange E-visit).  The importance of social distancing, staying at home, hand hygiene and wearing a mask when out in public were discussed today.  Current medicines are reviewed with the patient today.  The patient does not have concerns regarding medicines other than what has been noted above.  The following changes have been made:  See above.  Labs/ tests ordered today include:   No orders of the defined types were placed in this encounter.    Disposition:   FU with Dr. Radford Pax in 6 months.    Patient is agreeable to this plan and will call if any problems develop in the interim.   SignedTruitt Merle, NP  03/28/2019 9:51 AM  [facility] [address] [place], [place]  [address] Phone: [phone] Fax: [phone]

## 2019-03-27 ENCOUNTER — Ambulatory Visit: Payer: Medicare Other | Admitting: Physician Assistant

## 2019-03-28 ENCOUNTER — Ambulatory Visit: Payer: Medicare Other | Admitting: Nurse Practitioner

## 2019-03-28 ENCOUNTER — Other Ambulatory Visit: Payer: Self-pay

## 2019-03-28 ENCOUNTER — Ambulatory Visit: Payer: Medicare Other | Admitting: Physician Assistant

## 2019-03-28 ENCOUNTER — Encounter: Payer: Self-pay | Admitting: Nurse Practitioner

## 2019-03-28 VITALS — BP 122/70 | HR 60 | Ht 66.0 in | Wt 180.8 lb

## 2019-03-28 DIAGNOSIS — Z7189 Other specified counseling: Secondary | ICD-10-CM

## 2019-03-28 DIAGNOSIS — I480 Paroxysmal atrial fibrillation: Secondary | ICD-10-CM

## 2019-03-28 DIAGNOSIS — Z7901 Long term (current) use of anticoagulants: Secondary | ICD-10-CM

## 2019-03-28 DIAGNOSIS — R0789 Other chest pain: Secondary | ICD-10-CM

## 2019-03-28 NOTE — Patient Instructions (Addendum)
After Visit Summary:  We will be checking the following labs today - NONE   Medication Instructions:    Continue with your current medicines.   It is ok to take an extra dose of your Diltiazem 120 mg if you would need to for an elevated HR/recurrent atrial fib   If you need a refill on your cardiac medications before your next appointment, please call your pharmacy.     Testing/Procedures To Be Arranged:  N/A  Follow-Up:   See Dr. Radford Pax in 6 months.  You will receive a reminder letter in the mail two months in advance. If you don't receive a letter, please call our office to schedule the follow-up appointment.   At [facility], you and your health needs are our priority.  As part of our continuing mission to provide you with exceptional heart care, we have created designated Provider Care Teams.  These Care Teams include your primary Cardiologist (physician) and Advanced Practice Providers (APPs -  Physician Assistants and Nurse Practitioners) who all work together to provide you with the care you need, when you need it.  Special Instructions:  . Stay safe, stay home, wash your hands for at least 20 seconds and wear a mask when out in public.  . It was good to talk with you today.    Call the [place] office at [phone] if you have any questions, problems or concerns.

## 2019-04-20 ENCOUNTER — Ambulatory Visit: Payer: Medicare Other | Admitting: Cardiology

## 2019-05-01 ENCOUNTER — Ambulatory Visit
Admission: RE | Admit: 2019-05-01 | Discharge: 2019-05-01 | Disposition: A | Discharge status: Home / Self Care | Payer: Medicare Other | Source: Ambulatory Visit | Attending: Geriatric Medicine | Admitting: Geriatric Medicine

## 2019-05-01 ENCOUNTER — Other Ambulatory Visit: Payer: Self-pay

## 2019-05-01 DIAGNOSIS — M8580 Other specified disorders of bone density and structure, unspecified site: Secondary | ICD-10-CM

## 2019-05-14 ENCOUNTER — Other Ambulatory Visit: Payer: Self-pay

## 2019-05-14 ENCOUNTER — Encounter: Payer: Self-pay | Admitting: Podiatry

## 2019-05-14 ENCOUNTER — Ambulatory Visit: Payer: Medicare Other | Admitting: Podiatry

## 2019-05-14 DIAGNOSIS — L84 Corns and callosities: Secondary | ICD-10-CM | POA: Diagnosis not present

## 2019-05-14 DIAGNOSIS — M79671 Pain in right foot: Secondary | ICD-10-CM

## 2019-05-14 DIAGNOSIS — Z9229 Personal history of other drug therapy: Secondary | ICD-10-CM | POA: Diagnosis not present

## 2019-05-14 DIAGNOSIS — M79672 Pain in left foot: Secondary | ICD-10-CM | POA: Diagnosis not present

## 2019-05-14 NOTE — Patient Instructions (Addendum)
Corns and Calluses Corns are small areas of thickened skin that occur on the top, sides, or tip of a toe. They contain a cone-shaped core with a point that can press on a nerve below. This causes pain.  Calluses are areas of thickened skin that can occur anywhere on the body, including the hands, fingers, palms, soles of the feet, and heels. Calluses are usually larger than corns. What are the causes? Corns and calluses are caused by rubbing (friction) or pressure, such as from shoes that are too tight or do not fit properly. What increases the risk? Corns are more likely to develop in people who have misshapen toes (toe deformities), such as hammer toes. Calluses can occur with friction to any area of the skin. They are more likely to develop in people who:  Work with their hands.  Wear shoes that fit poorly, are too tight, or are high-heeled.  Have toe deformities. What are the signs or symptoms? Symptoms of a corn or callus include:  A hard growth on the skin.  Pain or tenderness under the skin.  Redness and swelling.  Increased discomfort while wearing tight-fitting shoes, if your feet are affected. If a corn or callus becomes infected, symptoms may include:  Redness and swelling that gets worse.  Pain.  Fluid, blood, or pus draining from the corn or callus. How is this diagnosed? Corns and calluses may be diagnosed based on your symptoms, your medical history, and a physical exam. How is this treated? Treatment for corns and calluses may include:  Removing the cause of the friction or pressure. This may involve: ? Changing your shoes. ? Wearing shoe inserts (orthotics) or other protective layers in your shoes, such as a corn pad. ? Wearing gloves.  Applying medicine to the skin (topical medicine) to help soften skin in the hardened, thickened areas.  Removing layers of dead skin with a file to reduce the size of the corn or callus.  Removing the corn or callus with a  scalpel or laser.  Taking antibiotic medicines, if your corn or callus is infected.  Having surgery, if a toe deformity is the cause. Follow these instructions at home:   Take over-the-counter and prescription medicines only as told by your health care provider.  If you were prescribed an antibiotic, take it as told by your health care provider. Do not stop taking it even if your condition starts to improve.  Wear shoes that fit well. Avoid wearing high-heeled shoes and shoes that are too tight or too loose.  Wear any padding, protective layers, gloves, or orthotics as told by your health care provider.  Soak your hands or feet and then use a file or pumice stone to soften your corn or callus. Do this as told by your health care provider.  Check your corn or callus every day for symptoms of infection. Contact a health care provider if you:  Notice that your symptoms do not improve with treatment.  Have redness or swelling that gets worse.  Notice that your corn or callus becomes painful.  Have fluid, blood, or pus coming from your corn or callus.  Have new symptoms. Summary  Corns are small areas of thickened skin that occur on the top, sides, or tip of a toe.  Calluses are areas of thickened skin that can occur anywhere on the body, including the hands, fingers, palms, and soles of the feet. Calluses are usually larger than corns.  Corns and calluses are caused by   rubbing (friction) or pressure, such as from shoes that are too tight or do not fit properly.  Treatment may include wearing any padding, protective layers, gloves, or orthotics as told by your health care provider. This information is not intended to replace advice given to you by your health care provider. Make sure you discuss any questions you have with your health care provider. Document Released: 02/21/2004 Document Revised: 09/06/2018 Document Reviewed: 03/30/2017 Elsevier Patient Education  2020 [place]  A bunion is a bump on the base of the big toe that forms when the bones of the big toe joint move out of position. Bunions may be small at first, but they often get larger over time. They can make walking painful. What are the causes? A bunion may be caused by:  Wearing narrow or pointed shoes that force the big toe to press against the other toes.  Abnormal foot development that causes the foot to roll inward (pronate).  Changes in the foot that are caused by certain diseases, such as rheumatoid arthritis or polio.  A foot injury. What increases the risk? The following factors may make you more likely to develop this condition:  Wearing shoes that squeeze the toes together.  Having certain diseases, such as: ? Rheumatoid arthritis. ? Polio. ? Cerebral palsy.  Having family members who have bunions.  Being born with a foot deformity, such as flat feet or low arches.  Doing activities that put a lot of pressure on the feet, such as ballet dancing. What are the signs or symptoms? The main symptom of a bunion is a noticeable bump on the big toe. Other symptoms may include:  Pain.  Swelling around the big toe.  Redness and inflammation.  Thick or hardened skin on the big toe or between the toes.  Stiffness or loss of motion in the big toe.  Trouble with walking. How is this diagnosed? A bunion may be diagnosed based on your symptoms, medical history, and activities. You may have tests, such as:  X-rays. These allow your health care provider to check the position of the bones in your foot and look for damage to your joint. They also help your health care provider determine the severity of your bunion and the best way to treat it.  Joint aspiration. In this test, a sample of fluid is removed from the toe joint. This test may be done if you are in a lot of pain. It helps rule out diseases that cause painful swelling of the joints, such as arthritis. How is  this treated? Treatment depends on the severity of your symptoms. The goal of treatment is to relieve symptoms and prevent the bunion from getting worse. Your health care provider may recommend:  Wearing shoes that have a wide toe box.  Using bunion pads to cushion the affected area.  Taping your toes together to keep them in a normal position.  Placing a device inside your shoe (orthotics) to help reduce pressure on your toe joint.  Taking medicine to ease pain, inflammation, and swelling.  Applying heat or ice to the affected area.  Doing stretching exercises.  Surgery to remove scar tissue and move the toes back into their normal position. This treatment is rare. Follow these instructions at home: Managing pain, stiffness, and swelling   If directed, put ice on the painful area: ? Put ice in a plastic bag. ? Place a towel between your skin and the bag. ? Leave the  ice on for 20 minutes, 2-3 times a day. Activity   If directed, apply heat to the affected area before you exercise. Use the heat source that your health care provider recommends, such as a moist heat pack or a heating pad. ? Place a towel between your skin and the heat source. ? Leave the heat on for 20-30 minutes. ? Remove the heat if your skin turns bright red. This is especially important if you are unable to feel pain, heat, or cold. You may have a greater risk of getting burned.  Do exercises as told by your health care provider. General instructions  Support your toe joint with proper footwear, shoe padding, or taping as told by your health care provider.  Take over-the-counter and prescription medicines only as told by your health care provider.  Keep all follow-up visits as told by your health care provider. This is important. Contact a health care provider if your symptoms:  Get worse.  Do not improve in 2 weeks. Get help right away if you have:  Severe pain and trouble with  walking. Summary  A bunion is a bump on the base of the big toe that forms when the bones of the big toe joint move out of position.  Bunions can make walking painful.  Treatment depends on the severity of your symptoms.  Support your toe joint with proper footwear, shoe padding, or taping as told by your health care provider. This information is not intended to replace advice given to you by your health care provider. Make sure you discuss any questions you have with your health care provider. Document Released: 05/17/2005 Document Revised: 11/21/2017 Document Reviewed: 09/27/2017 Elsevier Patient Education  [place].  Toe Deformity Repair  Toe deformity repair is a surgery to reposition a toe that stays bent in an unnatural position. For example, a hammer toe is a deformity that causes the middle joint of a toe to stay bent. A toe deformity can cause pain and may interfere with walking. You may need surgery if other treatments have not helped to straighten the toe and relieve your symptoms. Tell a health care provider about: Any allergies you have. All medicines you are taking, including vitamins, herbs, eye drops, creams, and over-the-counter medicines. Any problems you or family members have had with anesthetic medicines. Any blood disorders you have. Any surgeries you have had. Any medical conditions you have. Whether you are pregnant or may be pregnant. What are the risks? Generally, this is a safe procedure. However, problems may occur, including: Infection. Bleeding. Allergic reactions to medicines. Damage to nerves. Swelling. Pain. Numbness. Scarring. Toe positioning that is not straight (poor toe alignment). What happens before the procedure? Staying hydrated Follow instructions from your health care provider about hydration, which may include: Up to 2 hours before the procedure - you may continue to drink clear liquids, such as water, clear fruit juice,  black coffee, and plain tea.  Eating and drinking restrictions Follow instructions from your health care provider about eating and drinking, which may include: 8 hours before the procedure - stop eating heavy meals or foods such as meat, fried foods, or fatty foods. 6 hours before the procedure - stop eating light meals or foods, such as toast or cereal. 6 hours before the procedure - stop drinking milk or drinks that contain milk. 2 hours before the procedure - stop drinking clear liquids. Medicines Ask your health care provider about: Changing or stopping your regular medicines. This  is especially important if you are taking diabetes medicines or blood thinners. Taking medicines such as aspirin and ibuprofen. These medicines can thin your blood. Do not take these medicines unless your health care provider tells you to take them. Taking over-the-counter medicines, vitamins, herbs, and supplements. You may be given antibiotic medicine to help prevent an infection. General instructions Plan to have someone take you home from the hospital or clinic. Plan to have a responsible adult care for you for at least 24 hours after you leave the hospital or clinic. This is important. Ask your health care provider how your surgical site will be marked or identified. You may be asked to shower with a germ-killing soap. What happens during the procedure?  To lower your risk of infection: Your health care team will wash or sanitize their hands. Hair may be removed from the surgical area. Your skin will be washed with soap. An IV will be inserted into one of your veins. You will be given one of the following: A medicine to help you relax (sedative). A medicine to numb the area (local anesthetic). The medicine will be injected directly into your toe. A medicine to make you fall asleep (general anesthetic). The surgeon will make one or more incisions on your affected toe. If extra bone or extra soft  tissue is causing the deformity, it will be removed. If connective tissues such as tendons or ligaments are causing the deformity, they will be removed or relocated. Surgical pins or screws will be placed to hold your toe in place during the healing process. The incisions will be closed with stitches (sutures). A bandage (dressing) will be placed over the incision area. The procedure may vary among health care providers and hospitals. What happens after the procedure? Your blood pressure, heart rate, breathing rate, and blood oxygen level will be monitored until the medicines you were given have worn off. You will be given pain medicine as needed. You may have a post-operative shoe placed on your foot. Do not drive for 24 hours if you were given a sedative during your procedure. Summary A toe that stays bent in an unnatural position can be repaired with surgery (toe deformity repair). You may need surgery if other treatments have not helped to straighten the toe and relieve your symptoms. Before the procedure, follow instructions from your health care provider about eating and drinking. During the procedure, you may be given a medicine to numb the toe area or a medicine to make you fall asleep. This information is not intended to replace advice given to you by your health care provider. Make sure you discuss any questions you have with your health care provider. Document Released: 05/14/2000 Document Revised: 09/06/2018 Document Reviewed: 01/25/2017 Elsevier Patient Education  2020 [employer].

## 2019-05-20 DIAGNOSIS — L84 Corns and callosities: Secondary | ICD-10-CM | POA: Insufficient documentation

## 2019-05-20 DIAGNOSIS — M79672 Pain in left foot: Secondary | ICD-10-CM | POA: Insufficient documentation

## 2019-05-20 DIAGNOSIS — M79671 Pain in right foot: Secondary | ICD-10-CM | POA: Insufficient documentation

## 2019-05-20 NOTE — Progress Notes (Signed)
Subjective: Kimberly [name] presents to clinic with cc of painful corns right hallux and right 2nd digits which are aggravated when weightbearing with and without shoe gear.  This pain limits her daily activities. Pain symptoms resolve with periodic professional debridement.  Medications reviewed in chart.  Allergies  Allergen Reactions  . Azithromycin Other (See Comments)    Pancreatitis  . Erythromycin Hives    Reaction 1 time, when she was younger. Reaction 1 time, when she was younger.   Toniann Ket Other (See Comments)    Almonds: per allergy testing.  . Amoxicillin-Pot Clavulanate Nausea And Vomiting and Nausea Only    Has patient had a PCN reaction causing immediate rash, facial/tongue/throat swelling, SOB or lightheadedness with hypotension: No Has patient had a PCN reaction causing severe rash involving mucus membranes or skin necrosis: No Has patient had a PCN reaction that required hospitalization No Has patient had a PCN reaction occurring within the last 10 years: No If all of the above answers are "NO", then may proceed with Cephalosporin use.   . Atorvastatin Other (See Comments)    Muscle weakness  Muscle weakness   . Codeine Nausea Only  . Ketoconazole Nausea And Vomiting  . Ketoconazole Nausea Only and Nausea And Vomiting  . Latex Other (See Comments)    Other  . Lincomycin     Stomach upset   . Morphine Nausea Only  . Morphine Sulfate Nausea Only and Other (See Comments)      Unknown  . Other Other (See Comments)    Shellfish mix: per allergy testing.  . Shellfish Allergy Other (See Comments)    Shellfish mix: per allergy testing.  . Simvastatin Other (See Comments)    Muscle pain  . Sulfamethoxazole-Trimethoprim Nausea Only    Upset stomach   . Clindamycin Rash  . Clindamycin/Lincomycin Nausea And Vomiting and Rash  . Erythromycin Base Rash     Objective: There were no vitals filed for this visit.  Physical Examination:  Vascular   Examination: Capillary refill time immediate b/l.  Palpable pedal pulses b/l.  Digital hair present b/l.  No edema noted b/l.  Skin temperature gradient WNL b/l.  Dermatological Examination: Skin with normal turgor, texture and tone b/l.  Hyperkeratotic lesions plantar left foot, lateral right hallux IPJ and medial 2nd digit DIPJ with tenderness to palpation. No erythema, no edema, no drainage, no flocculence noted.  Musculoskeletal Examination: Muscle strength 5/5 to all muscle groups b/l.  HAV with bunion b/l. Hammertoes 2-5 b/l.   No pain, crepitus or joint discomfort with active/passive ROM.  Neurological Examination: Sensation intact 5/5 b/l with 10 gram monofilament.  Vibratory sensation intact b/l.  Proprioceptive sensation intact b/l.  Assessment: 1. 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 Callus plantar aspect left foot 3. Pain in foot b/l 4. Pt on long term blood thinner  Plan: 1. Callus pared left foot utilizing sterile scalpel blade without incident. Corns pared right hallux, right 2nd digitutilizing sterile scalpel blade without incident. Dispensed silicone toe caps for daily protection between visits.  2. Continue soft, supportive shoe gear daily. 3. Report any pedal injuries to medical professional. 4. Follow up 3 months. 5. Patient/POA to call should there be a question/concern in there interim.

## 2019-05-22 ENCOUNTER — Telehealth: Payer: Self-pay | Admitting: Internal Medicine

## 2019-05-22 NOTE — Telephone Encounter (Signed)
Pt is taking miralax and metamucil but may forget to take these a day or so. Pt wanted to know if she can take [facility] as it seems to help her. Discussed with her that is fine to take. Pt also wanted to know about senokot. Let her know she can take this if she needs to. She thanked me for the call.

## 2019-06-07 ENCOUNTER — Encounter: Payer: Medicare Other | Admitting: Gynecology

## 2019-06-18 ENCOUNTER — Telehealth: Payer: Self-pay | Admitting: Cardiology

## 2019-06-18 NOTE — Telephone Encounter (Signed)
New message   We are recommending the COVID-19 vaccine to all of our patients. Cardiac medications (including blood thinners) should not deter anyone from being vaccinated and there is no need to hold any of those medications prior to vaccine administration.     Currently, there is a hotline to call (active 06/08/19) to schedule vaccination appointments as no walk-ins will be accepted.   Number: [phone]    If you have further questions or concerns about the vaccine process, please visit [email or web address] or contact your primary care physician.

## 2019-06-20 ENCOUNTER — Encounter: Payer: Medicare Other | Admitting: Obstetrics and Gynecology

## 2019-07-18 ENCOUNTER — Encounter: Payer: Medicare Other | Admitting: Obstetrics and Gynecology

## 2019-07-24 ENCOUNTER — Ambulatory Visit: Payer: Medicare PPO | Admitting: Internal Medicine

## 2019-07-24 ENCOUNTER — Encounter: Payer: Self-pay | Admitting: Internal Medicine

## 2019-07-24 ENCOUNTER — Other Ambulatory Visit: Payer: Self-pay

## 2019-07-24 VITALS — BP 132/76 | HR 76 | Temp 97.9°F | Ht 66.0 in | Wt 176.0 lb

## 2019-07-24 DIAGNOSIS — K581 Irritable bowel syndrome with constipation: Secondary | ICD-10-CM | POA: Diagnosis not present

## 2019-07-24 DIAGNOSIS — Z86010 Personal history of colonic polyps: Secondary | ICD-10-CM | POA: Diagnosis not present

## 2019-07-24 DIAGNOSIS — K219 Gastro-esophageal reflux disease without esophagitis: Secondary | ICD-10-CM

## 2019-07-24 NOTE — Progress Notes (Signed)
Subjective:    Patient ID: Kimberly [name], female    DOB: [birth year], 79 y.o.   MRN: [id number]  HPI Kimberly [name] is a 79 year old female with a history of IBS with constipation, colonic diverticulosis, adenomatous colon polyps, GERD with small hiatal hernia, atrial fibrillation on Eliquis, history of bladder prolapse, hypothyroidism who is seen for follow-up.  She was last seen in the office on 01/15/2019 and for colonoscopy on 01/25/2019.  Colonoscopy was performed as above on 01/25/2019.  This revealed 8 polyps ranging from 2 to 5 mm in size.  There was also external hemorrhoids and internal hemorrhoids found.  These polyps were found to be tubular adenomas x6, benign lymphoid aggregate x2.  After the colonoscopy she did well on the day of the procedure but the next day developed upper abdominal pressure for which she was seen in the ER.  Evaluation there was unrevealing.  This resolved and was presumed to be likely tainted or trapped air.  Most recently she has been struggling with incomplete evacuation and constipation.  This despite MiraLAX 17 g daily.  She has started [employer] probiotic which she is using 1 or 2 days/week but wanted to discuss further with me.  At times stools feel incomplete and pellet-like.  This definitively relates to stress.  She has had higher stress levels as she helps in the care of her husband [name] who has cancer.  He has both thyroid and prostate cancer and gets treatment both locally and at [facility].   Review of Systems As per HPI, otherwise negative  Current Medications, Allergies, Past Medical History, Past Surgical History, Family History and Social History were reviewed in [employer] record.     Objective:   Physical Exam BP 132/76 (BP Location: Left Arm, Patient Position: Sitting, Cuff Size: Normal)   Pulse 76   Temp 97.9 F (36.6 C)   Ht 5\' 6"  (1.676 m)   Wt 176 lb (79.8 kg)   LMP  (LMP Unknown)   SpO2 97%   BMI 28.41  kg/m  Gen: awake, alert, NAD HEENT: anicteric Neuro: nonfocal  CBC    Component Value Date/Time   WBC 6.3 01/28/2019 0050   RBC 4.02 01/28/2019 0050   HGB 12.7 01/28/2019 0050   HCT 38.5 01/28/2019 0050   PLT 210 01/28/2019 0050   MCV 95.8 01/28/2019 0050   MCH 31.6 01/28/2019 0050   MCHC 33.0 01/28/2019 0050   RDW 12.3 01/28/2019 0050   LYMPHSABS 0.9 12/04/2018 1937   MONOABS 1.0 12/04/2018 1937   EOSABS 0.1 12/04/2018 1937   BASOSABS 0.0 12/04/2018 1937   CMP     Component Value Date/Time   NA 138 01/28/2019 0050   NA 144 04/19/2018 1356   K 3.5 01/28/2019 0050   CL 101 01/28/2019 0050   CO2 25 01/28/2019 0050   GLUCOSE 105 (H) 01/28/2019 0050   BUN 15 01/28/2019 0050   BUN 17 04/19/2018 1356   CREATININE 0.68 01/28/2019 0050   CALCIUM 9.4 01/28/2019 0050   PROT 6.8 12/04/2018 1937   ALBUMIN 4.0 12/04/2018 1937   AST 21 12/04/2018 1937   ALT 15 12/04/2018 1937   ALKPHOS 41 12/04/2018 1937   BILITOT 0.5 12/04/2018 1937   GFRNONAA >60 01/28/2019 0050   GFRAA >60 01/28/2019 0050        Assessment & Plan:  79 year old female with a history of IBS with constipation, colonic diverticulosis, adenomatous colon polyps, GERD with small hiatal hernia, atrial fibrillation on  Eliquis, history of bladder prolapse, hypothyroidism who is seen for follow-up.   1.  IBS with constipation predominance --her IBS worsens almost directly related to overall stress levels.  For her this manifests is constipation which we discussed.  She has been using MiraLAX and despite daily therapy still has constipation.  I encouraged her to use a probiotic on a daily basis and if after a month she is no better I would suggest we stop both and try Amitiza --Continue MiraLAX 17 g daily and begin [facility]' colon health 1 capsule once daily with meals. --She is asked to call me in 3 to 4 weeks to let me know if constipation has improved and if not I would suggest we stop MiraLAX and try Amitiza 8 mcg  twice daily with meals  2.  Personal history of adenomatous colon polyp/family history of colon cancer in distant relatives/family history of colon polyps in multiple first-degree relatives --her colonoscopy revealed 6 adenomas but fortunately all small.  We discussed that surveillance colonoscopy would be considered in 3 years based on her overall health at that time.  We will discuss this more in or around August 2023  3.  GERD --well-controlled recently without PPI therapy.  We will monitor without therapy for now  30 minutes total spent today including patient facing time, coordination of care, reviewing medical history/procedures/pertinent radiology studies, and documentation of the encounter.

## 2019-07-24 NOTE — Patient Instructions (Signed)
Continue Miralax daily.  Take [facility] once daily with food for the next 3 weeks. If your constipation is not significantly better, please call our office. We may need to complete a trial of Amitiza.  If you are age 79 or older, your body mass index should be between 23-30. Your Body mass index is 28.41 kg/m. If this is out of the aforementioned range listed, please consider follow up with your Primary Care Provider.  If you are age 54 or younger, your body mass index should be between 19-25. Your Body mass index is 28.41 kg/m. If this is out of the aformentioned range listed, please consider follow up with your Primary Care Provider.

## 2019-07-25 ENCOUNTER — Encounter: Payer: Medicare Other | Admitting: Obstetrics and Gynecology

## 2019-07-25 ENCOUNTER — Encounter: Payer: Self-pay | Admitting: *Deleted

## 2019-07-25 ENCOUNTER — Telehealth: Payer: Self-pay | Admitting: Internal Medicine

## 2019-07-25 NOTE — Telephone Encounter (Signed)
I have spoken to patient and have advised she can take her miralax at 17 grams/1 capful every day. She verbalizes understanding.

## 2019-08-07 ENCOUNTER — Ambulatory Visit: Payer: Medicare Other | Admitting: Cardiology

## 2019-08-13 ENCOUNTER — Encounter: Payer: Self-pay | Admitting: Podiatry

## 2019-08-13 ENCOUNTER — Ambulatory Visit: Payer: Medicare PPO | Admitting: Podiatry

## 2019-08-13 ENCOUNTER — Other Ambulatory Visit: Payer: Self-pay

## 2019-08-13 VITALS — Temp 96.8°F

## 2019-08-13 DIAGNOSIS — M79671 Pain in right foot: Secondary | ICD-10-CM

## 2019-08-13 DIAGNOSIS — Z9229 Personal history of other drug therapy: Secondary | ICD-10-CM

## 2019-08-13 DIAGNOSIS — M79672 Pain in left foot: Secondary | ICD-10-CM

## 2019-08-13 DIAGNOSIS — L84 Corns and callosities: Secondary | ICD-10-CM

## 2019-08-13 NOTE — Patient Instructions (Signed)

## 2019-08-19 NOTE — Progress Notes (Signed)
Subjective: Kimberly [name] presents today for follow up of corn(s) interdigitally right great toe, right 2nd toe which are painful and callus(es) plantar aspect left foot. Pain interferes with ambulation. Aggravating factors include wearing enclosed shoe gear. Pain is relieved with periodic professional debridement as well as daily digital padding between visits.  She states her right great toe is most symptomatic today. Her pads help until the lesion grows a certain thickness. She denies any drainage, redness, or swelling of digit.  She remains on blood thinner, Eliquis.  Allergies  Allergen Reactions  . Azithromycin Other (See Comments)    Pancreatitis  . Erythromycin Hives    Reaction 1 time, when she was younger. Reaction 1 time, when she was younger.   Toniann Ket Other (See Comments)    Almonds: per allergy testing.  . Amoxicillin-Pot Clavulanate Nausea And Vomiting and Nausea Only    Has patient had a PCN reaction causing immediate rash, facial/tongue/throat swelling, SOB or lightheadedness with hypotension: No Has patient had a PCN reaction causing severe rash involving mucus membranes or skin necrosis: No Has patient had a PCN reaction that required hospitalization No Has patient had a PCN reaction occurring within the last 10 years: No If all of the above answers are "NO", then may proceed with Cephalosporin use.   . Atorvastatin Other (See Comments)    Muscle weakness  Muscle weakness   . Codeine Nausea Only  . Ketoconazole Nausea And Vomiting  . Ketoconazole Nausea Only and Nausea And Vomiting  . Latex Other (See Comments)    Other  . Lincomycin     Stomach upset   . Morphine Nausea Only  . Morphine Sulfate Nausea Only and Other (See Comments)      Unknown  . Other Other (See Comments)    Shellfish mix: per allergy testing.  . Shellfish Allergy Other (See Comments)    Shellfish mix: per allergy testing.  . Simvastatin Other (See Comments)    Muscle pain   . Sulfamethoxazole-Trimethoprim Nausea Only    Upset stomach   . Clindamycin Rash  . Clindamycin/Lincomycin Nausea And Vomiting and Rash  . Erythromycin Base Rash     Objective: Vitals:   08/13/19 1002  Temp: (!) 96.8 F (19 C)    Pt is a  79 y.o. year old Caucasian female WD, WN  in NAD. AAO x 3.   Vascular Examination:  Capillary refill time to digits immediate b/l. Palpable DP pulses b/l. Palpable PT pulses b/l. Pedal hair present b/l. Skin temperature gradient within normal limits b/l.  Dermatological Examination: Pedal skin with normal turgor, texture and tone bilaterally. No open wounds bilaterally. No interdigital macerations bilaterally. Hyperkeratotic lesion(s) submet heads 2, 3 left foot, medial aspect right 2nd digit abutting lateral aspect of right hallux IPJ. Right foot lesions are interdigital and tendern to palpation.  No erythema, no edema, no drainage, no flocculence.  Musculoskeletal: Normal muscle strength 5/5 to all lower extremity muscle groups bilaterally, no pain crepitus or joint limitation noted with ROM b/l, bunion deformity noted b/l and hammertoes noted to the  2-5 bilaterally.  Neurological: Protective sensation intact 5/5 intact bilaterally with 10g monofilament b/l Vibratory sensation intact b/l Proprioception intact bilaterally  Assessment: 1. Corns and callosities   2. Pain in both feet   3. Hx of long term use of blood thinners    Plan: -Corn(s) right hallux, right 2nd digit and callus(es) submet heads 2, 3 left foot were debrided without complication or incident. Total  number debrided =4. -Patient to continue soft, supportive shoe gear daily. -Patient to report any pedal injuries to medical professional immediately. -Patient/POA to call should there be question/concern in the interim.  Return in about 9 weeks (around 10/15/2019) for nail and callus trim/ Eliquis.

## 2019-08-22 ENCOUNTER — Encounter: Payer: Medicare PPO | Admitting: Obstetrics and Gynecology

## 2019-09-06 ENCOUNTER — Encounter: Payer: Medicare PPO | Admitting: Obstetrics and Gynecology

## 2019-09-10 DIAGNOSIS — G4733 Obstructive sleep apnea (adult) (pediatric): Secondary | ICD-10-CM | POA: Diagnosis not present

## 2019-09-11 DIAGNOSIS — L738 Other specified follicular disorders: Secondary | ICD-10-CM | POA: Diagnosis not present

## 2019-09-11 DIAGNOSIS — D2272 Melanocytic nevi of left lower limb, including hip: Secondary | ICD-10-CM | POA: Diagnosis not present

## 2019-09-11 DIAGNOSIS — D1721 Benign lipomatous neoplasm of skin and subcutaneous tissue of right arm: Secondary | ICD-10-CM | POA: Diagnosis not present

## 2019-09-11 DIAGNOSIS — L821 Other seborrheic keratosis: Secondary | ICD-10-CM | POA: Diagnosis not present

## 2019-09-11 DIAGNOSIS — D1801 Hemangioma of skin and subcutaneous tissue: Secondary | ICD-10-CM | POA: Diagnosis not present

## 2019-09-11 DIAGNOSIS — Z85828 Personal history of other malignant neoplasm of skin: Secondary | ICD-10-CM | POA: Diagnosis not present

## 2019-09-17 ENCOUNTER — Encounter: Payer: Self-pay | Admitting: Cardiology

## 2019-09-17 ENCOUNTER — Ambulatory Visit: Payer: Medicare PPO | Admitting: Cardiology

## 2019-09-17 ENCOUNTER — Other Ambulatory Visit: Payer: Self-pay

## 2019-09-17 VITALS — BP 138/66 | HR 65 | Ht 66.0 in | Wt 177.8 lb

## 2019-09-17 DIAGNOSIS — I480 Paroxysmal atrial fibrillation: Secondary | ICD-10-CM | POA: Diagnosis not present

## 2019-09-17 DIAGNOSIS — R55 Syncope and collapse: Secondary | ICD-10-CM

## 2019-09-17 DIAGNOSIS — R079 Chest pain, unspecified: Secondary | ICD-10-CM

## 2019-09-17 NOTE — Patient Instructions (Signed)

## 2019-09-17 NOTE — Progress Notes (Addendum)
Cardiology Office Note:    Date:  09/17/2019   ID:  Kimberly [name], DOB Jun 01, 1940, MRN [id number]  PCP:  Kimberly Manes, MD  Cardiologist:  Kimberly Him, MD    Referring MD: Kimberly Manes, MD   Chief Complaint  Patient presents with  . Atrial Fibrillation  . Hypertension    History of Present Illness:    Kimberly [name] is a 79 y.o. female with a hx of PAF on metoprolol and Eliquis for chads vas score of 5. Metoprolol decreased by [facility] because of bradycardia.She also has a historyof chest pain with normal nuclear stress testandsyncope felt secondary to dehydration. She has a chronic RBBB with no ischemia on stress test a year ago. Due to chest pain a coronary CTA was done and showed  a calcium score of 0 and no evidence of CAD.    She presented to the ER on 8/30 with recurrent CP that occurred as a band around under her breasts that was 5/10 without radiation and associated with pressure.  EMS was called and noted her HR to be in the 200's in afib.  She received NTG with improvement in pain.  Pain lasted 30 minutes but was not associated with any other sx. Initial trop in ER was normal at 11 but trended up to 135.  2D echo showed normal LVF with no PE.  It was felt that she may have GERD and had gone off PPI.  She was placed back on her PPI.    She is here today for followup and is doing well.  SHe has a lot of stress with her husband's health and will get very upset and still occasionally will have some chest pain.  She still occasionally will feel like she is going into afib but does not go into it and this occurs when she gets very emotionally upset.  She denies any  SOB, DOE, PND, orthopnea, LE edema (except when eating too much salt), dizziness,or syncope. She is compliant with her meds and is tolerating meds with no SE.    She is doing well with her CPAP device and thinks thatshe has gotten used to it.  She tolerates the mask and feels the pressure is  adequate.  Since going on CPAP he feels rested in the am and has no significant daytime sleepiness.  She has some mouth dryness but no  nasal dryness or nasal congestion.  SHe does not think that he snores.     Past Medical History:  Diagnosis Date  . Allergy   . Anal polyp 1998   Flex Sig   . Anemia   . Angular blepharitis of left eye   . Ankle fracture    Stress fracture  . Anxiety   . Asthma    border line has inhaler  . Bunion   . Cataracts, bilateral   . Corneal scar    left eye  . Degenerative disc disease   . Depression   . Diverticulosis of colon (without mention of hemorrhage) 2010   Colonoscopy  . Dry eyes    bilateral  . Enterocele   . External hemorrhoids 2000   Colonoscopy  . Family history of malignant neoplasm of gastrointestinal tract   . Female cystocele   . Fibromyalgia   . GERD (gastroesophageal reflux disease)   . Hiatal hernia 2005,2010   EGD  . History of bronchitis   . History of measles   . History of mumps   .  History of strep sore throat   . History of urinary tract infection   . Hyperlipemia   . Hypothyroidism   . IBS (irritable bowel syndrome)   . Imbalance   . Internal hemorrhoids without mention of complication 0000000   Colonoscopy   . Itching   . 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 Menopause   . Migraine   . OSA (obstructive sleep apnea) 05/14/2015   on BiPAP  . Osteopenia 10/2013   T score -1.6 FRAX 10%/1.6%  . PAF (paroxysmal atrial fibrillation) ([place])   . Pancreatitis   . PCO (posterior capsular opacification)    left  . Pneumonia    childhood illness  . PONV (postoperative nausea and vomiting)   . Pseudophakia, both eyes   . PVD (posterior vitreous detachment) right  . Rash    on back   . Retinal scar    left  . Rotator cuff disorder    pain, left shoulder  . Shingles   . Stricture and stenosis of esophagus 2005,2010   EGD   . Stroke ([place])   . Ulcerative colitis ([place])   . Varicose veins   . Vertigo   . Wears glasses      Past Surgical History:  Procedure Laterality Date  . ABDOMINAL HYSTERECTOMY    . ANAL FISSURE REPAIR    . BREAST EXCISIONAL BIOPSY Left   . BUNIONECTOMY  2014  . CATARACT EXTRACTION Bilateral 2013  . COLONOSCOPY     polyp removed  . ESOPHAGEAL DILATION    . EYE SURGERY Left   . FOOT SURGERY     left   . HEMORRHOID SURGERY    . MOUTH SURGERY  11/13/11   Cyst removed from gum-benign   . NASAL SEPTUM SURGERY    . NASAL SEPTUM SURGERY  1970's  . REPLACEMENT TOTAL KNEE Right 2011  . TONSILLECTOMY    . TOTAL ABDOMINAL HYSTERECTOMY W/ BILATERAL SALPINGOOPHORECTOMY  1991   TAH BSO  . TOTAL HIP ARTHROPLASTY     right   . TOTAL KNEE ARTHROPLASTY     both  . TOTAL KNEE ARTHROPLASTY Left 06/23/2015   Procedure: LEFT TOTAL KNEE ARTHROPLASTY;  Surgeon: Gaynelle Arabian, MD;  Location: [facility];  Service: Orthopedics;  Laterality: Left;    Current Medications: Current Meds  Medication Sig  . acetaminophen (TYLENOL) 500 MG tablet Take 500 mg by mouth every 6 (six) hours as needed for moderate pain.   Marland Kitchen apixaban (ELIQUIS) 5 MG TABS tablet Take 1 tablet (5 mg total) by mouth 2 (two) times daily.  Marland Kitchen diltiazem (CARDIZEM CD) 180 MG 24 hr capsule Take 1 capsule (180 mg total) by mouth daily.  Marland Kitchen EPINEPHrine 0.3 mg/0.3 mL IJ SOAJ injection Inject 0.3 mg into the muscle as needed (for allergic reaction). Reported on 05/28/2015   . fluticasone (FLONASE) 50 MCG/ACT nasal spray Place 2 sprays into both nostrils 2 (two) times daily as needed for allergies.   Marland Kitchen levothyroxine (SYNTHROID) 100 MCG tablet Take 100 mcg by mouth daily before breakfast.  . omeprazole (PRILOSEC) 20 MG capsule Take 20 mg by mouth as needed. For heartburn  . ondansetron (ZOFRAN-ODT) 4 MG disintegrating tablet Take 1 tablet (4 mg total) by mouth every 8 (eight) hours as needed for nausea or vomiting.  . Polyethylene Glycol 3350 (MIRALAX PO) Take 17 g by mouth daily as needed (constipation).   Marland Kitchen PROAIR HFA 108 (90 Base) MCG/ACT  inhaler Inhale 1-2 puffs into the lungs every 6 (six) hours  as needed for wheezing.   . Probiotic Product (ALIGN) 4 MG CAPS Take 1 capsule by mouth daily.  . RESTASIS 0.05 % ophthalmic emulsion Place 1 drop into both eyes 2 (two) times daily. Uses as directed  . rosuvastatin (CRESTOR) 10 MG tablet Take 5 mg by mouth daily.      Allergies:   Azithromycin, Erythromycin, Almond oil, Amoxicillin-pot clavulanate, Atorvastatin, Codeine, Ketoconazole, Ketoconazole, Latex, Lincomycin, Morphine, Morphine sulfate, Other, Shellfish allergy, Simvastatin, Sulfamethoxazole-trimethoprim, Clindamycin, Clindamycin/lincomycin, and Erythromycin base   Social History   Socioeconomic History  . Marital status: Married    Spouse name: Not on file  . Number of children: 1  . Years of education: bus.school  . Highest education level: Not on file  Occupational History  . Occupation: Retired    Fish farm manager: RETIRED  Tobacco Use  . Smoking status: Former Smoker    Packs/day: 0.25    Years: 15.00    Pack years: 3.75    Types: Cigarettes    Quit date: 06/01/1975    Years since quitting: 44.3  . Smokeless tobacco: Never Used  . Tobacco comment: Stopped smoking age 14  Substance and Sexual Activity  . Alcohol use: No    Alcohol/week: 0.0 standard drinks  . Drug use: No  . Sexual activity: Never    Birth control/protection: Surgical    Comment: HYST, intecourse age unknown, sexual partner less than 5  Other Topics Concern  . Not on file  Social History Narrative   Daily caffeine    Social Determinants of Health   Financial Resource Strain:   . Difficulty of Paying Living Expenses:   Food Insecurity:   . Worried About Charity fundraiser in the Last Year:   . Arboriculturist in the Last Year:   Transportation Needs:   . Film/video editor (Medical):   Marland Kitchen Lack of Transportation (Non-Medical):   Physical Activity:   . Days of Exercise per Week:   . Minutes of Exercise per Session:   Stress:   .  Feeling of Stress :   Social Connections:   . Frequency of Communication with Friends and Family:   . Frequency of Social Gatherings with Friends and Family:   . Attends Religious Services:   . Active Member of Clubs or Organizations:   . Attends Archivist Meetings:   Marland Kitchen Marital Status:      Family History: The patient's family history includes Alcohol abuse in her father; Allergies in her sister; Allergy (severe) in her sister; Alzheimer's disease in her mother; Arthritis in her brother; Breast cancer (age of onset: 31) in her sister; Colon cancer in her paternal aunt; Colon polyps in her brother and sister; Congestive Heart Failure in her father; Dementia in her mother; Diabetes in her sister; Heart attack in her maternal grandfather, maternal grandmother, and paternal grandfather; Heart disease in her maternal grandmother; Hypertension in her father; Irritable bowel syndrome in her mother; Other in her sister and sister; Stroke in her mother. There is no history of Esophageal cancer, Rectal cancer, or Stomach cancer.  ROS:   Please see the history of present illness.    ROS  All other systems reviewed and negative.   EKGs/Labs/Other Studies Reviewed:    The following studies were reviewed today: none  EKG:  EKG is not ordered today.   Recent Labs: 12/04/2018: ALT 15 01/28/2019: BUN 15; Creatinine, Ser 0.68; Hemoglobin 12.7; Platelets 210; Potassium 3.5; Sodium 138   Recent Lipid Panel  Component Value Date/Time   CHOL 124 07/23/2013 0632   TRIG 69 07/23/2013 0632   HDL 68 07/23/2013 0632   CHOLHDL 1.8 07/23/2013 0632   VLDL 14 07/23/2013 0632   LDLCALC 42 07/23/2013 [id number]    Physical Exam:    VS:  BP 138/66   Pulse 65   Ht 5\' 6"  (1.676 m)   Wt 177 lb 12.8 oz (80.6 kg)   LMP  (LMP Unknown)   SpO2 97%   BMI 28.70 kg/m     Wt Readings from Last 3 Encounters:  09/17/19 177 lb 12.8 oz (80.6 kg)  07/24/19 176 lb (79.8 kg)  03/28/19 180 lb 12.8 oz (82 kg)       GEN:  Well nourished, well developed in no acute distress HEENT: Normal NECK: No JVD; No carotid bruits LYMPHATICS: No lymphadenopathy CARDIAC: RRR, no murmurs, rubs, gallops RESPIRATORY:  Clear to auscultation without rales, wheezing or rhonchi  ABDOMEN: Soft, non-tender, non-distended MUSCULOSKELETAL:  No edema; No deformity  SKIN: Warm and dry NEUROLOGIC:  Alert and oriented x 3 PSYCHIATRIC:  Normal affect   ASSESSMENT:    1. PAF (paroxysmal atrial fibrillation) (HCC)   2. Chest pain of uncertain etiology   3. Syncope, unspecified syncope type    PLAN:    In order of problems listed above:  1.  PAF  -she has not had any further palpitations -no bleeding on DOAC -continue on Cardizem CD 180mg  daily -continue ELiquis 5mg  BID. -Creatinine was 0.74 and Hbg 13.2 in Feb 2021  2.  Chest pain -noncardiac in origin -coronary CTA with calcium score 0 and no CAD -possibly related to GERD -back on PPI  3.  Syncope -no further episodes -felt related to dehydration in the past  4.  OSA -   The patient is tolerating PAP therapy well without any problems. The PAP download was reviewed today and showed an AHI of 5.8/hr on auto PAP with 80% compliance in using more than 4 hours nightly.  The patient has been using and benefiting from PAP use and will continue to benefit from therapy.      Medication Adjustments/Labs and Tests Ordered: Current medicines are reviewed at length with the patient today.  Concerns regarding medicines are outlined above.  No orders of the defined types were placed in this encounter.  No orders of the defined types were placed in this encounter.   Signed, Kimberly Him, MD  09/17/2019 1:14 PM    [place]

## 2019-10-05 DIAGNOSIS — H35371 Puckering of macula, right eye: Secondary | ICD-10-CM | POA: Diagnosis not present

## 2019-10-05 DIAGNOSIS — H15832 Staphyloma posticum, left eye: Secondary | ICD-10-CM | POA: Diagnosis not present

## 2019-10-05 DIAGNOSIS — H53003 Unspecified amblyopia, bilateral: Secondary | ICD-10-CM | POA: Diagnosis not present

## 2019-10-05 DIAGNOSIS — H31091 Other chorioretinal scars, right eye: Secondary | ICD-10-CM | POA: Diagnosis not present

## 2019-10-05 DIAGNOSIS — H33311 Horseshoe tear of retina without detachment, right eye: Secondary | ICD-10-CM | POA: Diagnosis not present

## 2019-10-05 DIAGNOSIS — H4422 Degenerative myopia, left eye: Secondary | ICD-10-CM | POA: Diagnosis not present

## 2019-10-11 DIAGNOSIS — H5789 Other specified disorders of eye and adnexa: Secondary | ICD-10-CM | POA: Diagnosis not present

## 2019-10-11 DIAGNOSIS — H1789 Other corneal scars and opacities: Secondary | ICD-10-CM | POA: Diagnosis not present

## 2019-10-11 DIAGNOSIS — H04123 Dry eye syndrome of bilateral lacrimal glands: Secondary | ICD-10-CM | POA: Diagnosis not present

## 2019-10-15 ENCOUNTER — Other Ambulatory Visit: Payer: Self-pay

## 2019-10-16 ENCOUNTER — Encounter: Payer: Self-pay | Admitting: Obstetrics and Gynecology

## 2019-10-16 ENCOUNTER — Ambulatory Visit: Payer: Medicare PPO | Admitting: Obstetrics and Gynecology

## 2019-10-16 ENCOUNTER — Encounter: Payer: Medicare PPO | Admitting: Obstetrics and Gynecology

## 2019-10-16 VITALS — BP 122/78 | Ht 66.0 in | Wt 176.0 lb

## 2019-10-16 DIAGNOSIS — Z01419 Encounter for gynecological examination (general) (routine) without abnormal findings: Secondary | ICD-10-CM | POA: Diagnosis not present

## 2019-10-16 DIAGNOSIS — N8189 Other female genital prolapse: Secondary | ICD-10-CM

## 2019-10-16 NOTE — Progress Notes (Signed)
Kimberly [name] 1940-10-22 [id number]  SUBJECTIVE:  79 y.o. G0P0 female for annual routine gynecologic exam.  Her main concern is that she feels her bladder has dropped further.  She has been working with [facility], and has been referred to Dr. Dessie Coma [facility] for further management.  She otherwise has no gynecologic concerns.  Current Outpatient Medications  Medication Sig Dispense Refill  . acetaminophen (TYLENOL) 500 MG tablet Take 500 mg by mouth every 6 (six) hours as needed for moderate pain.     Marland Kitchen alendronate (FOSAMAX) 70 MG tablet PLEASE SEE ATTACHED FOR DETAILED DIRECTIONS    . apixaban (ELIQUIS) 5 MG TABS tablet Take 1 tablet (5 mg total) by mouth 2 (two) times daily. 180 tablet 2  . diltiazem (CARDIZEM CD) 180 MG 24 hr capsule Take 1 capsule (180 mg total) by mouth daily. 90 capsule 3  . EPINEPHrine 0.3 mg/0.3 mL IJ SOAJ injection Inject 0.3 mg into the muscle as needed (for allergic reaction). Reported on 05/28/2015     . fluticasone (FLONASE) 50 MCG/ACT nasal spray Place 2 sprays into both nostrils 2 (two) times daily as needed for allergies.     Marland Kitchen levothyroxine (SYNTHROID) 100 MCG tablet Take 100 mcg by mouth daily before breakfast.    . omeprazole (PRILOSEC) 20 MG capsule Take 20 mg by mouth as needed. For heartburn    . ondansetron (ZOFRAN-ODT) 4 MG disintegrating tablet Take 1 tablet (4 mg total) by mouth every 8 (eight) hours as needed for nausea or vomiting. 8 tablet 0  . Polyethylene Glycol 3350 (MIRALAX PO) Take 17 g by mouth daily as needed (constipation).     Marland Kitchen PROAIR HFA 108 (90 Base) MCG/ACT inhaler Inhale 1-2 puffs into the lungs every 6 (six) hours as needed for wheezing.   0  . Probiotic Product (ALIGN) 4 MG CAPS Take 1 capsule by mouth daily.    . RESTASIS 0.05 % ophthalmic emulsion Place 1 drop into both eyes 2 (two) times daily. Uses as directed    . rosuvastatin (CRESTOR) 10 MG tablet Take 5 mg by mouth daily.   4   No current  facility-administered medications for this visit.   Allergies: Amoxicillin-pot clavulanate, Azithromycin, Erythromycin, Almond oil, Atorvastatin, Codeine, Ketoconazole, Ketoconazole, Latex, Lincomycin, Morphine, Morphine sulfate, Other, Shellfish allergy, Simvastatin, Sulfamethoxazole-trimethoprim, Clindamycin, Clindamycin/lincomycin, and Erythromycin base  No LMP recorded (lmp unknown). Patient has had a hysterectomy.  Past medical history,surgical history, problem list, medications, allergies, family history and social history were all reviewed and documented as reviewed in the EPIC chart.  ROS:  Feeling well. No dyspnea or chest pain on exertion.  No abdominal pain, change in bowel habits, black or bloody stools.  No urinary tract symptoms. GYN ROS: no abnormal bleeding, pelvic pain or discharge, no breast pain or new or enlarging lumps on self exam. No neurological complaints.   OBJECTIVE:  BP 122/78   Ht 5\' 6"  (1.676 m)   Wt 176 lb (79.8 kg)   LMP  (LMP Unknown)   BMI 28.41 kg/m  The patient appears well, alert, oriented x 3, in no distress. ENT normal.  Neck supple. No cervical or supraclavicular adenopathy or thyromegaly.  Lungs are clear, good air entry, no wheezes, rhonchi or rales. S1 and S2 normal, no murmurs, regular rate and rhythm.   Abdomen soft without tenderness, guarding, mass or organomegaly.  Neurological is normal, no focal findings.  BREAST EXAM: breasts appear normal, no suspicious masses, no skin or nipple changes or  axillary nodes  PELVIC EXAM: VULVA: normal appearing vulva with no masses, tenderness or lesions, VAGINA: normal appearing vagina with normal color and discharge, no lesions, grade 2 enterocele/cystocele, CERVIX: surgically absent, UTERUS: surgically absent, vaginal cuff appears well supported and normal, ADNEXA: no masses, no tenderness  Chaperone: Caryn Bee present during the examination  ASSESSMENT:  79 y.o. G0P0 here for annual gynecologic  exam  PLAN:   1. Postmenopausal.  Prior TAH/BSO.  No gynecologic concerns such as vaginal bleeding or vasomotor symptoms.  See below. 2. 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  She has been referred to specialty at [facility] for further management of the pelvic organ prolapse issues.  We also discussed that pessary is another option potentially.  She will go to the consultation and will let us know if she has any questions or concerns here. 3. Pap smear 2011.  No Pap smear done today.  No history of significant abnormal Pap smears.  We both agree to stop screening per current screening guidelines based on age and hysterectomy history. 4. Mammogram 01/2019.  Normal breast exam today.  She will continue annual mammography. 5. Colonoscopy 2020.  Recommended that she follow up at the recommended interval.   6. Osteopenia.  DEXA 05/2019.  T-score -1.9.  Followed by Dr. Felipa Eth.  We will continue to follow-up with him in reference to bone health.  She has been holding off on starting alendronate as recommended as she plans to get dental work later this year. 7. Health maintenance.  No labs today as she normally has these completed elsewhere.   Return annually or sooner, prn.  Joseph Pierini MD 10/16/19

## 2019-10-19 ENCOUNTER — Other Ambulatory Visit (HOSPITAL_COMMUNITY): Payer: Self-pay | Admitting: Cardiology

## 2019-10-19 NOTE — Telephone Encounter (Signed)
Prescription refill request for Eliquis received.  Last office visit: 09/17/2019, Turner Scr:  0.68, 01/28/2019 Age: 79 y.o.  Weight: 79.8 kg  Prescription refill sent.

## 2019-11-02 DIAGNOSIS — G8929 Other chronic pain: Secondary | ICD-10-CM | POA: Diagnosis not present

## 2019-11-02 DIAGNOSIS — M545 Low back pain: Secondary | ICD-10-CM | POA: Diagnosis not present

## 2019-11-02 DIAGNOSIS — I480 Paroxysmal atrial fibrillation: Secondary | ICD-10-CM | POA: Diagnosis not present

## 2019-11-02 DIAGNOSIS — D6869 Other thrombophilia: Secondary | ICD-10-CM | POA: Diagnosis not present

## 2019-11-02 DIAGNOSIS — M8000XD Age-related osteoporosis with current pathological fracture, unspecified site, subsequent encounter for fracture with routine healing: Secondary | ICD-10-CM | POA: Diagnosis not present

## 2019-11-05 DIAGNOSIS — H11432 Conjunctival hyperemia, left eye: Secondary | ICD-10-CM | POA: Diagnosis not present

## 2019-11-05 DIAGNOSIS — H53142 Visual discomfort, left eye: Secondary | ICD-10-CM | POA: Diagnosis not present

## 2019-11-05 DIAGNOSIS — H5712 Ocular pain, left eye: Secondary | ICD-10-CM | POA: Diagnosis not present

## 2019-11-05 DIAGNOSIS — H04123 Dry eye syndrome of bilateral lacrimal glands: Secondary | ICD-10-CM | POA: Diagnosis not present

## 2019-11-13 ENCOUNTER — Encounter: Payer: Self-pay | Admitting: Podiatry

## 2019-11-13 ENCOUNTER — Ambulatory Visit: Payer: Medicare PPO | Admitting: Podiatry

## 2019-11-13 ENCOUNTER — Other Ambulatory Visit: Payer: Self-pay

## 2019-11-13 DIAGNOSIS — L84 Corns and callosities: Secondary | ICD-10-CM

## 2019-11-13 DIAGNOSIS — Z9229 Personal history of other drug therapy: Secondary | ICD-10-CM

## 2019-11-13 NOTE — Patient Instructions (Signed)

## 2019-11-19 NOTE — Progress Notes (Signed)
Subjective: Kimberly [name] is a pleasant 79 y.o. female patient seen today painful corn(s) b/l  which interfere(s) with ambulation. Aggravating factors include wearing enclosed shoe gear. Pain is relieved with periodic professional debridement.  Past Medical History:  Diagnosis Date  . Allergy   . Anal polyp 1998   Flex Sig   . Anemia   . Angular blepharitis of left eye   . Ankle fracture    Stress fracture  . Anxiety   . Asthma    border line has inhaler  . Bunion   . Cataracts, bilateral   . Corneal scar    left eye  . CPAP (continuous positive airway pressure) dependence   . Degenerative disc disease   . Depression   . Diverticulosis of colon (without mention of hemorrhage) 2010   Colonoscopy  . Dry eyes    bilateral  . Enterocele   . External hemorrhoids 2000   Colonoscopy  . Family history of malignant neoplasm of gastrointestinal tract   . Female cystocele   . Fibromyalgia   . GERD (gastroesophageal reflux disease)   . Hiatal hernia 2005,2010   EGD  . History of bronchitis   . History of measles   . History of mumps   . History of strep sore throat   . History of urinary tract infection   . Hyperlipemia   . Hypothyroidism   . IBS (irritable bowel syndrome)   . Imbalance   . Internal hemorrhoids without mention of complication 7673,[id number]   Colonoscopy   . Itching   . 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 Menopause   . Migraine   . OSA (obstructive sleep apnea) 05/14/2015   on BiPAP  . Osteopenia 10/2013   T score -1.6 FRAX 10%/1.6%  . PAF (paroxysmal atrial fibrillation) ([place])   . Pancreatitis   . PCO (posterior capsular opacification)    left  . Pneumonia    childhood illness  . PONV (postoperative nausea and vomiting)   . Pseudophakia, both eyes   . PVD (posterior vitreous detachment) right  . Rash    on back   . Retinal scar    left  . Rotator cuff disorder    pain, left shoulder  . Shingles   . Stricture and stenosis of esophagus 2005,2010   EGD    . Stroke ([place])   . Ulcerative colitis ([place])   . Varicose veins   . Vertigo   . Wears glasses     Patient Active Problem List   Diagnosis Date Noted  . Corns and callosities 05/20/2019  . Pain in both feet 05/20/2019  . Chest pain 01/28/2019  . Chest pain of uncertain etiology 79/07/4095  . Adiposity 07/13/2018  . Excess weight 07/13/2018  . Hay fever 07/13/2018  . PAF (paroxysmal atrial fibrillation) ([place]) 08/25/2017  . Syncope 05/13/2016  . Heart palpitations 05/13/2016  . Rhinitis, chronic 02/04/2016  . IBS (irritable bowel syndrome) 07/14/2015  . Nausea without vomiting 07/14/2015  . OA (osteoarthritis) of knee 06/23/2015  . OSA (obstructive sleep apnea) 05/14/2015  . Preoperative clearance 01/08/2015  . Acute infection of nasal sinus 02/25/2014  . Pancreatitis 07/22/2013  . Leukocytosis, unspecified 07/22/2013  . Amblyopia 03/07/2013  . Retinal scar 03/07/2013  . Cellulitis 01/15/2013  . 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 Angular blepharitis 10/11/2012  . Dry eyes 10/11/2012  . PCO (posterior capsular opacification) 10/11/2012  . PVD (posterior vitreous detachment) 10/11/2012  . 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 Cataracts, bilateral   .  Elevated cholesterol   . Degenerative disc disease   . Bunion   . Ankle fracture   . 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 Hemorrhoid   . DUB (dysfunctional uterine bleeding)   . DIARRHEA 09/16/2009  . PERSONAL HX COLONIC POLYPS 09/16/2009  . LACUNAR INFARCTION 07/16/2008  . CONSTIPATION 07/12/2008  . CHEST PAIN 07/12/2008  . DYSPHAGIA 07/12/2008  . Hypothyroidism 07/10/2008  . Anxiety state 07/10/2008  . DEPRESSION 07/10/2008  . INTERNAL HEMORRHOIDS 07/10/2008  . ESOPHAGEAL STRICTURE 07/10/2008  . GERD 07/10/2008  . HIATAL HERNIA 07/10/2008  . DIVERTICULOSIS, COLON 07/10/2008  . IRRITABLE BOWEL SYNDROME 07/10/2008  . Osteoarthritis 07/10/2008  . FIBROMYALGIA 07/10/2008  . Blues 07/10/2008    Current Outpatient  Medications on File Prior to Visit  Medication Sig Dispense Refill  . acetaminophen (TYLENOL) 500 MG tablet Take 500 mg by mouth every 6 (six) hours as needed for moderate pain.     Marland Kitchen alendronate (FOSAMAX) 70 MG tablet PLEASE SEE ATTACHED FOR DETAILED DIRECTIONS    . diltiazem (CARDIZEM CD) 180 MG 24 hr capsule Take 1 capsule (180 mg total) by mouth daily. 90 capsule 3  . ELIQUIS 5 MG TABS tablet TAKE 1 TABLET BY MOUTH TWICE A DAY 180 tablet 1  . EPINEPHrine 0.3 mg/0.3 mL IJ SOAJ injection Inject 0.3 mg into the muscle as needed (for allergic reaction). Reported on 05/28/2015     . fluticasone (FLONASE) 50 MCG/ACT nasal spray Place 2 sprays into both nostrils 2 (two) times daily as needed for allergies.     Marland Kitchen levothyroxine (SYNTHROID) 100 MCG tablet Take 100 mcg by mouth daily before breakfast.    . omeprazole (PRILOSEC) 20 MG capsule Take 20 mg by mouth as needed. For heartburn    . ondansetron (ZOFRAN-ODT) 4 MG disintegrating tablet Take 1 tablet (4 mg total) by mouth every 8 (eight) hours as needed for nausea or vomiting. 8 tablet 0  . Polyethylene Glycol 3350 (MIRALAX PO) Take 17 g by mouth daily as needed (constipation).     Marland Kitchen PROAIR HFA 108 (90 Base) MCG/ACT inhaler Inhale 1-2 puffs into the lungs every 6 (six) hours as needed for wheezing.   0  . Probiotic Product (ALIGN) 4 MG CAPS Take 1 capsule by mouth daily.    . RESTASIS 0.05 % ophthalmic emulsion Place 1 drop into both eyes 2 (two) times daily. Uses as directed    . rosuvastatin (CRESTOR) 10 MG tablet Take 5 mg by mouth daily.   4   No current facility-administered medications on file prior to visit.    Allergies  Allergen Reactions  . Amoxicillin-Pot Clavulanate Nausea Only and Swelling    Has patient had a PCN reaction causing immediate rash, facial/tongue/throat swelling, SOB or lightheadedness with hypotension: No Has patient had a PCN reaction causing severe rash involving mucus membranes or skin necrosis: No Has patient  had a PCN reaction that required hospitalization No Has patient had a PCN reaction occurring within the last 10 years: No If all of the above answers are "NO", then may proceed with Cephalosporin use.   . Azithromycin Other (See Comments)    Pancreatitis  . Erythromycin Hives    Reaction 1 time, when she was younger. Reaction 1 time, when she was younger.   Toniann Ket Other (See Comments)    Almonds: per allergy testing.  . Atorvastatin Other (See Comments)    Muscle weakness  Muscle weakness   . Codeine Nausea Only  . Ketoconazole Nausea  And Vomiting  . Ketoconazole Nausea Only and Nausea And Vomiting  . Latex Other (See Comments)    Other  . Lincomycin     Stomach upset   . Morphine Nausea Only  . Morphine Sulfate Nausea Only and Other (See Comments)      Unknown  . Other Other (See Comments)    Shellfish mix: per allergy testing.  . Shellfish Allergy Other (See Comments)    Shellfish mix: per allergy testing.  . Simvastatin Other (See Comments)    Muscle pain  . Sulfamethoxazole-Trimethoprim Nausea Only    Upset stomach   . Clindamycin Rash  . Clindamycin/Lincomycin Nausea And Vomiting and Rash  . Erythromycin Base Rash    Objective: Physical Exam  General: Lorine Iannaccone is a pleasant 79 y.o. Caucasian female, WD, WN in NAD. AAO x 3.   Vascular:  Neurovascular status unchanged b/l lower extremities. Capillary refill time to digits immediate b/l. Palpable pedal pulses b/l LE. Pedal hair present. Lower extremity skin temperature gradient within normal limits. No pain with calf compression b/l.  Dermatological:  Pedal skin with normal turgor, texture and tone bilaterally. No open wounds bilaterally. No interdigital macerations bilaterally. Hyperkeratotic lesion(s) R hallux, R 2nd toe, submet head 2 left foot and submet head 3 left foot.  No erythema, no edema, no drainage, no flocculence.  Musculoskeletal:  Normal muscle strength 5/5 to all lower  extremity muscle groups bilaterally. No pain crepitus or joint limitation noted with ROM b/l. Hallux valgus with bunion deformity noted b/l lower extremities. Hammertoes noted to the 2-5 bilaterally.  Neurological:  Protective sensation intact 5/5 intact bilaterally with 10g monofilament b/l. Vibratory sensation intact b/l. Proprioception intact bilaterally.  Assessment and Plan:  1. Corns and callosities   2. Hx of long term use of blood thinners    -Examined patient. -No new findings. No new orders. -Corn(s) R hallux and R 2nd toe and callus(es) submet head 2 left foot and submet head 3 left foot were pared utilizing sterile scalpel blade without incident. Total number debrided =4. -Patient to report any pedal injuries to medical professional immediately. -Patient to continue soft, supportive shoe gear daily. -Patient/POA to call should there be question/concern in the interim.  Return in about 3 months (around 02/13/2020) for callus trim/ Eliquis.  Marzetta Board, DPM

## 2019-11-28 DIAGNOSIS — H1789 Other corneal scars and opacities: Secondary | ICD-10-CM | POA: Diagnosis not present

## 2019-11-28 DIAGNOSIS — H04123 Dry eye syndrome of bilateral lacrimal glands: Secondary | ICD-10-CM | POA: Diagnosis not present

## 2019-11-28 DIAGNOSIS — H43813 Vitreous degeneration, bilateral: Secondary | ICD-10-CM | POA: Diagnosis not present

## 2019-11-28 DIAGNOSIS — H524 Presbyopia: Secondary | ICD-10-CM | POA: Diagnosis not present

## 2019-12-18 DIAGNOSIS — Z79899 Other long term (current) drug therapy: Secondary | ICD-10-CM | POA: Diagnosis not present

## 2019-12-18 DIAGNOSIS — E039 Hypothyroidism, unspecified: Secondary | ICD-10-CM | POA: Diagnosis not present

## 2020-01-10 ENCOUNTER — Other Ambulatory Visit: Payer: Self-pay | Admitting: Obstetrics and Gynecology

## 2020-01-10 DIAGNOSIS — Z1231 Encounter for screening mammogram for malignant neoplasm of breast: Secondary | ICD-10-CM

## 2020-01-30 DIAGNOSIS — M25552 Pain in left hip: Secondary | ICD-10-CM | POA: Diagnosis not present

## 2020-01-30 DIAGNOSIS — M545 Low back pain: Secondary | ICD-10-CM | POA: Diagnosis not present

## 2020-02-08 DIAGNOSIS — Z85828 Personal history of other malignant neoplasm of skin: Secondary | ICD-10-CM | POA: Diagnosis not present

## 2020-02-08 DIAGNOSIS — L720 Epidermal cyst: Secondary | ICD-10-CM | POA: Diagnosis not present

## 2020-02-08 DIAGNOSIS — D1801 Hemangioma of skin and subcutaneous tissue: Secondary | ICD-10-CM | POA: Diagnosis not present

## 2020-02-10 ENCOUNTER — Emergency Department (HOSPITAL_BASED_OUTPATIENT_CLINIC_OR_DEPARTMENT_OTHER)
Admission: EM | Admit: 2020-02-10 | Discharge: 2020-02-10 | Disposition: A | Discharge status: Home / Self Care | Payer: Medicare PPO | Attending: Emergency Medicine | Admitting: Emergency Medicine

## 2020-02-10 ENCOUNTER — Encounter (HOSPITAL_BASED_OUTPATIENT_CLINIC_OR_DEPARTMENT_OTHER): Payer: Self-pay | Admitting: Emergency Medicine

## 2020-02-10 ENCOUNTER — Other Ambulatory Visit: Payer: Self-pay

## 2020-02-10 ENCOUNTER — Emergency Department (HOSPITAL_BASED_OUTPATIENT_CLINIC_OR_DEPARTMENT_OTHER): Payer: Medicare PPO

## 2020-02-10 DIAGNOSIS — Y9389 Activity, other specified: Secondary | ICD-10-CM | POA: Diagnosis not present

## 2020-02-10 DIAGNOSIS — Z96653 Presence of artificial knee joint, bilateral: Secondary | ICD-10-CM | POA: Insufficient documentation

## 2020-02-10 DIAGNOSIS — Z79890 Hormone replacement therapy (postmenopausal): Secondary | ICD-10-CM | POA: Diagnosis not present

## 2020-02-10 DIAGNOSIS — S299XXA Unspecified injury of thorax, initial encounter: Secondary | ICD-10-CM | POA: Diagnosis not present

## 2020-02-10 DIAGNOSIS — E039 Hypothyroidism, unspecified: Secondary | ICD-10-CM | POA: Diagnosis not present

## 2020-02-10 DIAGNOSIS — Z87891 Personal history of nicotine dependence: Secondary | ICD-10-CM | POA: Insufficient documentation

## 2020-02-10 DIAGNOSIS — Z79899 Other long term (current) drug therapy: Secondary | ICD-10-CM | POA: Insufficient documentation

## 2020-02-10 DIAGNOSIS — Y999 Unspecified external cause status: Secondary | ICD-10-CM | POA: Diagnosis not present

## 2020-02-10 DIAGNOSIS — Z7901 Long term (current) use of anticoagulants: Secondary | ICD-10-CM | POA: Insufficient documentation

## 2020-02-10 DIAGNOSIS — J45909 Unspecified asthma, uncomplicated: Secondary | ICD-10-CM | POA: Diagnosis not present

## 2020-02-10 DIAGNOSIS — R0781 Pleurodynia: Secondary | ICD-10-CM

## 2020-02-10 DIAGNOSIS — Y92511 Restaurant or cafe as the place of occurrence of the external cause: Secondary | ICD-10-CM | POA: Diagnosis not present

## 2020-02-10 DIAGNOSIS — X501XXA Overexertion from prolonged static or awkward postures, initial encounter: Secondary | ICD-10-CM | POA: Diagnosis not present

## 2020-02-10 DIAGNOSIS — I700 Atherosclerosis of aorta: Secondary | ICD-10-CM | POA: Diagnosis not present

## 2020-02-10 DIAGNOSIS — Z91040 Latex allergy status: Secondary | ICD-10-CM | POA: Insufficient documentation

## 2020-02-10 NOTE — ED Provider Notes (Signed)
[facility] EMERGENCY DEPARTMENT Provider Note   CSN: [id number] Arrival date & time: 02/10/20  1126     History Chief Complaint  Patient presents with  . Rib Injury    Kimberly [name] is a 79 y.o. female.  HPI Patient is a 79 year old female with a medical history as noted below, particularly atrial fibrillation on Eliquis.  Patient states that about 3 to 4 days ago she was at a restaurant and bent over to pick something up from the floor and felt as if she "overextended herself" resulting in moderate pain to the right inferior anterior chest wall.  Pain worsens with deep breathing and palpation.  She states that her pain has gradually alleviated since onset but was concerned that may be "she cracked a rib" so she came to the emergency department for further evaluation.  She denies any central chest pain, shortness of breath, abdominal pain, head trauma, LOC.    Past Medical History:  Diagnosis Date  . Allergy   . Anal polyp 1998   Flex Sig   . Anemia   . Angular blepharitis of left eye   . Ankle fracture    Stress fracture  . Anxiety   . Asthma    border line has inhaler  . Bunion   . Cataracts, bilateral   . Corneal scar    left eye  . CPAP (continuous positive airway pressure) dependence   . Degenerative disc disease   . Depression   . Diverticulosis of colon (without mention of hemorrhage) 2010   Colonoscopy  . Dry eyes    bilateral  . Enterocele   . External hemorrhoids 2000   Colonoscopy  . Family history of malignant neoplasm of gastrointestinal tract   . Female cystocele   . Fibromyalgia   . GERD (gastroesophageal reflux disease)   . Hiatal hernia 2005,2010   EGD  . History of bronchitis   . History of measles   . History of mumps   . History of strep sore throat   . History of urinary tract infection   . Hyperlipemia   . Hypothyroidism   . IBS (irritable bowel syndrome)   . Imbalance   . Internal hemorrhoids without mention of  complication 3532,9924   Colonoscopy   . Itching   . 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 Menopause   . Migraine   . OSA (obstructive sleep apnea) 05/14/2015   on BiPAP  . Osteopenia 10/2013   T score -1.6 FRAX 10%/1.6%  . PAF (paroxysmal atrial fibrillation) ([place])   . Pancreatitis   . PCO (posterior capsular opacification)    left  . Pneumonia    childhood illness  . PONV (postoperative nausea and vomiting)   . Pseudophakia, both eyes   . PVD (posterior vitreous detachment) right  . Rash    on back   . Retinal scar    left  . Rotator cuff disorder    pain, left shoulder  . Shingles   . Stricture and stenosis of esophagus 2005,2010   EGD   . Stroke ([place])   . Ulcerative colitis ([place])   . Varicose veins   . Vertigo   . Wears glasses     Patient Active Problem List   Diagnosis Date Noted  . Corns and callosities 05/20/2019  . Pain in both feet 05/20/2019  . Chest pain 01/28/2019  . Chest pain of uncertain etiology 26/83/4196  . Adiposity 07/13/2018  . Excess weight 07/13/2018  . Hay  fever 07/13/2018  . PAF (paroxysmal atrial fibrillation) ([place]) 08/25/2017  . Syncope 05/13/2016  . Heart palpitations 05/13/2016  . Rhinitis, chronic 02/04/2016  . IBS (irritable bowel syndrome) 07/14/2015  . Nausea without vomiting 07/14/2015  . OA (osteoarthritis) of knee 06/23/2015  . OSA (obstructive sleep apnea) 05/14/2015  . Preoperative clearance 01/08/2015  . Acute infection of nasal sinus 02/25/2014  . Pancreatitis 07/22/2013  . Leukocytosis, unspecified 07/22/2013  . Amblyopia 03/07/2013  . Retinal scar 03/07/2013  . Cellulitis 01/15/2013  . 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 Angular blepharitis 10/11/2012  . Dry eyes 10/11/2012  . PCO (posterior capsular opacification) 10/11/2012  . PVD (posterior vitreous detachment) 10/11/2012  . 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 Cataracts, bilateral   . Elevated cholesterol   . Degenerative disc disease   . Bunion   . Ankle  fracture   . 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 Hemorrhoid   . DUB (dysfunctional uterine bleeding)   . DIARRHEA 09/16/2009  . PERSONAL HX COLONIC POLYPS 09/16/2009  . LACUNAR INFARCTION 07/16/2008  . CONSTIPATION 07/12/2008  . CHEST PAIN 07/12/2008  . DYSPHAGIA 07/12/2008  . Hypothyroidism 07/10/2008  . Anxiety state 07/10/2008  . DEPRESSION 07/10/2008  . INTERNAL HEMORRHOIDS 07/10/2008  . ESOPHAGEAL STRICTURE 07/10/2008  . GERD 07/10/2008  . HIATAL HERNIA 07/10/2008  . DIVERTICULOSIS, COLON 07/10/2008  . IRRITABLE BOWEL SYNDROME 07/10/2008  . Osteoarthritis 07/10/2008  . FIBROMYALGIA 07/10/2008  . Blues 07/10/2008    Past Surgical History:  Procedure Laterality Date  . ABDOMINAL HYSTERECTOMY    . ANAL FISSURE REPAIR    . BREAST EXCISIONAL BIOPSY Left   . BUNIONECTOMY  2014  . CATARACT EXTRACTION Bilateral 2013  . COLONOSCOPY     polyp removed  . ESOPHAGEAL DILATION    . EYE SURGERY Left   . FOOT SURGERY     left   . HEMORRHOID SURGERY    . MOUTH SURGERY  11/13/11   Cyst removed from gum-benign   . NASAL SEPTUM SURGERY    . NASAL SEPTUM SURGERY  1970's  . REPLACEMENT TOTAL KNEE Right 2011  . TONSILLECTOMY    . TOTAL ABDOMINAL HYSTERECTOMY W/ BILATERAL SALPINGOOPHORECTOMY  1991   TAH BSO  . TOTAL HIP ARTHROPLASTY     right   . TOTAL KNEE ARTHROPLASTY     both  . TOTAL KNEE ARTHROPLASTY Left 06/23/2015   Procedure: LEFT TOTAL KNEE ARTHROPLASTY;  Surgeon: Gaynelle Arabian, MD;  Location: [facility];  Service: Orthopedics;  Laterality: Left;     OB History    Gravida  0   Para      Term      Preterm      AB      Living        SAB      TAB      Ectopic      Multiple      Live Births              Family History  Problem Relation Age of Onset  . Dementia Mother   . Irritable bowel syndrome Mother   . Alzheimer's disease Mother   . Stroke Mother   . Hypertension Father   . Congestive Heart Failure Father   . Alcohol abuse Father   . Diabetes Sister    . Allergy (severe) Sister   . Colon cancer Paternal Aunt   . Heart disease Maternal Grandmother   . Heart attack Maternal Grandmother   . Heart  attack Paternal Grandfather   . Heart attack Maternal Grandfather   . Arthritis Brother   . Colon polyps Brother   . Other Sister        pituitary disease  . Allergies Sister   . Other Sister        joint problems  . Breast cancer Sister 104  . Colon polyps Sister   . Esophageal cancer Neg Hx   . Rectal cancer Neg Hx   . Stomach cancer Neg Hx     Social History   Tobacco Use  . Smoking status: Former Smoker    Packs/day: 0.25    Years: 15.00    Pack years: 3.75    Types: Cigarettes    Quit date: 06/01/1975    Years since quitting: 44.7  . Smokeless tobacco: Never Used  . Tobacco comment: Stopped smoking age 45  Vaping Use  . Vaping Use: Never used  Substance Use Topics  . Alcohol use: No    Alcohol/week: 0.0 standard drinks  . Drug use: No    Home Medications Prior to Admission medications   Medication Sig Start Date End Date Taking? Authorizing Provider  acetaminophen (TYLENOL) 500 MG tablet Take 500 mg by mouth every 6 (six) hours as needed for moderate pain.     [provider]  alendronate (FOSAMAX) 70 MG tablet PLEASE SEE ATTACHED FOR DETAILED DIRECTIONS 05/08/19   [provider]  diltiazem (CARDIZEM CD) 180 MG 24 hr capsule Take 1 capsule (180 mg total) by mouth daily. 02/08/19   Sherran Needs, NP  ELIQUIS 5 MG TABS tablet TAKE 1 TABLET BY MOUTH TWICE A DAY 10/19/19   Fransico Him R, MD  EPINEPHrine 0.3 mg/0.3 mL IJ SOAJ injection Inject 0.3 mg into the muscle as needed (for allergic reaction). Reported on 05/28/2015     [provider]  fluticasone (FLONASE) 50 MCG/ACT nasal spray Place 2 sprays into both nostrils 2 (two) times daily as needed for allergies.  06/06/13   [provider]  levothyroxine (SYNTHROID) 100 MCG tablet Take 100 mcg by mouth daily before breakfast.    [provider]  omeprazole (PRILOSEC) 20 MG capsule Take 20 mg by mouth as needed. For heartburn    [provider]  ondansetron (ZOFRAN-ODT) 4 MG disintegrating tablet Take 1 tablet (4 mg total) by mouth every 8 (eight) hours as needed for nausea or vomiting. 12/04/18   Davonna Belling, MD  Polyethylene Glycol 3350 (MIRALAX PO) Take 17 g by mouth daily as needed (constipation).     [provider]  PROAIR HFA 108 [phone] Base) MCG/ACT inhaler Inhale 1-2 puffs into the lungs every 6 (six) hours as needed for wheezing.  04/09/15   [provider]  Probiotic Product (ALIGN) 4 MG CAPS Take 1 capsule by mouth daily.    [provider]  RESTASIS 0.05 % ophthalmic emulsion Place 1 drop into both eyes 2 (two) times daily. Uses as directed 09/30/12   [provider]  rosuvastatin (CRESTOR) 10 MG tablet Take 5 mg by mouth daily.  03/28/15   [provider]    Allergies    Amoxicillin-pot clavulanate, Azithromycin, Erythromycin, Almond oil, Atorvastatin, Codeine, Ketoconazole, Ketoconazole, Latex, Lincomycin, Morphine, Morphine sulfate, Other, Shellfish allergy, Simvastatin, Sulfamethoxazole-trimethoprim, Clindamycin, Clindamycin/lincomycin, and Erythromycin base  Review of Systems   Review of Systems  Respiratory: Negative for shortness of breath.   Cardiovascular: Negative for chest pain.  Gastrointestinal: Negative for abdominal pain.  Musculoskeletal: Positive for myalgias.  Skin: Negative for color change and wound.    Physical Exam Updated Vital Signs BP (!) 160/78 (BP Location: Left Arm)   Pulse (!) 59   Temp 97.8 F (36.6 C) (Oral)   Resp 18   Wt 79.8 kg   LMP  (LMP Unknown)   SpO2 100%   BMI 28.40 kg/m   Physical Exam Vitals and nursing note reviewed.  Constitutional:      General: She is not in acute distress.    Appearance: Normal appearance. She is normal weight. She is not ill-appearing, toxic-appearing or diaphoretic.  HENT:      Head: Normocephalic and atraumatic.     Right Ear: External ear normal.     Left Ear: External ear normal.     Nose: Nose normal.     Mouth/Throat:     Mouth: Mucous membranes are moist.     Pharynx: Oropharynx is clear. No oropharyngeal exudate or posterior oropharyngeal erythema.  Eyes:     Extraocular Movements: Extraocular movements intact.     Conjunctiva/sclera: Conjunctivae normal.  Cardiovascular:     Rate and Rhythm: Normal rate and regular rhythm.     Pulses: Normal pulses.     Heart sounds: Normal heart sounds. No murmur heard.  No friction rub. No gallop.      Comments: RRR. No M/R/G. 2+ radial pulses bilaterally. Pulmonary:     Effort: Pulmonary effort is normal. No respiratory distress.     Breath sounds: Normal breath sounds. No stridor. No wheezing, rhonchi or rales.  Abdominal:     General: Abdomen is flat.     Tenderness: There is no abdominal tenderness.  Musculoskeletal:        General: Tenderness present. Normal range of motion.     Cervical back: Normal range of motion and neck supple. No tenderness.     Comments: Very mild TTP noted to the right anterior inferior ribs just beneath the right breast. No crepitus. No flail chest. Symmetrical rise and fall of the thorax with inspiration and expiration.   Skin:    General: Skin is warm and dry.  Neurological:     General: No focal deficit present.     Mental Status: She is alert and oriented to person, place, and time.  Psychiatric:        Mood and Affect: Mood normal.        Behavior: Behavior normal.     ED Results / Procedures / Treatments   Labs (all labs ordered are listed, but only abnormal results are displayed) Labs Reviewed - No data to display  EKG None  Radiology DG Ribs Unilateral W/Chest Right  Result Date: 02/10/2020 CLINICAL DATA:  79 year old female with rib pain underlying the right breast EXAM: RIGHT RIBS AND CHEST - 3+ VIEW COMPARISON:  None. FINDINGS: No fracture or other bone  lesions are seen involving the ribs. There is no evidence of pneumothorax or pleural effusion. Both lungs are clear. Heart size and mediastinal contours are within normal limits. Mild aortic atherosclerotic calcifications. IMPRESSION: Negative. Electronically Signed   By: Jacqulynn Cadet M.D.   On: 02/10/2020 12:19    Procedures Procedures (including critical care time)  Medications Ordered in ED Medications - No data to display  ED Course  I have reviewed the triage vital signs and the nursing notes.  Pertinent labs & imaging results that were available during my care of the patient were reviewed by me and considered in my medical decision making (see chart for  details).    MDM Rules/Calculators/A&P                          Pt is a 79 y.o. female that presents with a history, physical exam, and ED Clinical Course as noted above.   Pt presents with right rib pain. Seems to likely be 2/2 pulled muscle in the region. Pt has palpable pain in the region. Not hypoxic. No SOB. Not tachycardic. Pt is anticoagulated on Eliquis and is compliant. CXR and x-ray of the ribs are negative.  Discussed APAP for pain management. We discussed multiple topical creams for management of her pain. Ice/heat.   Patient is hemodynamically stable and in NAD at the time of d/c. Evaluation does not show pathology that would require ongoing emergent intervention or inpatient treatment. I explained the diagnosis to the patient. Patient is comfortable with above plan and is stable for discharge at this time. All questions were answered prior to disposition. Strict return precautions for returning to the ED were discussed. Encouraged follow up with PCP.    An After Visit Summary was printed and given to the patient.  Patient discharged to home/self care.  Condition at discharge: Stable  Note: Portions of this report may have been transcribed using voice recognition software. Every effort was made to ensure  accuracy; however, inadvertent computerized transcription errors may be present.   Final Clinical Impression(s) / ED Diagnoses Final diagnoses:  Rib pain on right side    Rx / DC Orders ED Discharge Orders    None       Rayna Sexton, PA-C 02/10/20 1518    Drenda Freeze, MD 02/10/20 1538

## 2020-02-10 NOTE — ED Triage Notes (Signed)
Patient states that she was bending over to pick something up from the floor when she felt like she pulled a muscle in her right rib. Since then she has had pain to her right rib

## 2020-02-10 NOTE — Discharge Instructions (Addendum)
You can continue to take Tylenol for management of your pain.  Please follow the instructions on the bottle.  There are multiple topical creams on the market for pain management.  I would recommend Voltaren or Aleve.  These are anti-inflammatory creams that you can apply directly to the region.  Please continue to apply ice or heat to the region.  Please follow-up with your regular doctor for reevaluation next week.  You can always return to the ER with new or worsening symptoms.    It was a pleasure to meet you.

## 2020-02-12 DIAGNOSIS — G4733 Obstructive sleep apnea (adult) (pediatric): Secondary | ICD-10-CM | POA: Diagnosis not present

## 2020-02-15 DIAGNOSIS — R0789 Other chest pain: Secondary | ICD-10-CM | POA: Diagnosis not present

## 2020-02-15 DIAGNOSIS — G4733 Obstructive sleep apnea (adult) (pediatric): Secondary | ICD-10-CM | POA: Diagnosis not present

## 2020-02-15 DIAGNOSIS — I480 Paroxysmal atrial fibrillation: Secondary | ICD-10-CM | POA: Diagnosis not present

## 2020-02-15 DIAGNOSIS — D6869 Other thrombophilia: Secondary | ICD-10-CM | POA: Diagnosis not present

## 2020-02-15 DIAGNOSIS — K219 Gastro-esophageal reflux disease without esophagitis: Secondary | ICD-10-CM | POA: Diagnosis not present

## 2020-02-15 DIAGNOSIS — Z23 Encounter for immunization: Secondary | ICD-10-CM | POA: Diagnosis not present

## 2020-02-20 ENCOUNTER — Ambulatory Visit: Payer: Medicare PPO | Admitting: Podiatry

## 2020-02-20 ENCOUNTER — Other Ambulatory Visit: Payer: Self-pay

## 2020-02-20 ENCOUNTER — Encounter: Payer: Self-pay | Admitting: Podiatry

## 2020-02-20 DIAGNOSIS — M79672 Pain in left foot: Secondary | ICD-10-CM

## 2020-02-20 DIAGNOSIS — Z7901 Long term (current) use of anticoagulants: Secondary | ICD-10-CM

## 2020-02-20 DIAGNOSIS — Z9229 Personal history of other drug therapy: Secondary | ICD-10-CM

## 2020-02-20 DIAGNOSIS — L84 Corns and callosities: Secondary | ICD-10-CM

## 2020-02-20 DIAGNOSIS — M79671 Pain in right foot: Secondary | ICD-10-CM

## 2020-02-22 ENCOUNTER — Ambulatory Visit
Admission: RE | Admit: 2020-02-22 | Discharge: 2020-02-22 | Disposition: A | Discharge status: Home / Self Care | Payer: Medicare PPO | Source: Ambulatory Visit | Attending: Obstetrics and Gynecology | Admitting: Obstetrics and Gynecology

## 2020-02-22 ENCOUNTER — Other Ambulatory Visit: Payer: Self-pay

## 2020-02-22 DIAGNOSIS — Z1231 Encounter for screening mammogram for malignant neoplasm of breast: Secondary | ICD-10-CM | POA: Diagnosis not present

## 2020-02-24 NOTE — Progress Notes (Signed)
Subjective: Kimberly [name] is a pleasant 79 y.o. female patient seen today painful corn(s) b/l  which interfere(s) with ambulation. Aggravating factors include wearing enclosed shoe gear. Pain is relieved with periodic professional debridement.   She voices no new pedal problems on today's visit.   Past Medical History:  Diagnosis Date  . Allergy   . Anal polyp 1998   Flex Sig   . Anemia   . Angular blepharitis of left eye   . Ankle fracture    Stress fracture  . Anxiety   . Asthma    border line has inhaler  . Bunion   . Cataracts, bilateral   . Corneal scar    left eye  . CPAP (continuous positive airway pressure) dependence   . Degenerative disc disease   . Depression   . Diverticulosis of colon (without mention of hemorrhage) 2010   Colonoscopy  . Dry eyes    bilateral  . Enterocele   . External hemorrhoids 2000   Colonoscopy  . Family history of malignant neoplasm of gastrointestinal tract   . Female cystocele   . Fibromyalgia   . GERD (gastroesophageal reflux disease)   . Hiatal hernia 2005,2010   EGD  . History of bronchitis   . History of measles   . History of mumps   . History of strep sore throat   . History of urinary tract infection   . Hyperlipemia   . Hypothyroidism   . IBS (irritable bowel syndrome)   . Imbalance   . Internal hemorrhoids without mention of complication 2876,8115   Colonoscopy   . Itching   . 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 Menopause   . Migraine   . OSA (obstructive sleep apnea) 05/14/2015   on BiPAP  . Osteopenia 10/2013   T score -1.6 FRAX 10%/1.6%  . PAF (paroxysmal atrial fibrillation) ([place])   . Pancreatitis   . PCO (posterior capsular opacification)    left  . Pneumonia    childhood illness  . PONV (postoperative nausea and vomiting)   . Pseudophakia, both eyes   . PVD (posterior vitreous detachment) right  . Rash    on back   . Retinal scar    left  . Rotator cuff disorder    pain, left shoulder  . Shingles    . Stricture and stenosis of esophagus 2005,2010   EGD   . Stroke ([place])   . Ulcerative colitis ([place])   . Varicose veins   . Vertigo   . Wears glasses     Patient Active Problem List   Diagnosis Date Noted  . Corns and callosities 05/20/2019  . Pain in both feet 05/20/2019  . Chest pain 01/28/2019  . Chest pain of uncertain etiology 72/62/0355  . Adiposity 07/13/2018  . Excess weight 07/13/2018  . Hay fever 07/13/2018  . PAF (paroxysmal atrial fibrillation) ([place]) 08/25/2017  . Syncope 05/13/2016  . Heart palpitations 05/13/2016  . Rhinitis, chronic 02/04/2016  . IBS (irritable bowel syndrome) 07/14/2015  . Nausea without vomiting 07/14/2015  . OA (osteoarthritis) of knee 06/23/2015  . OSA (obstructive sleep apnea) 05/14/2015  . Preoperative clearance 01/08/2015  . Acute infection of nasal sinus 02/25/2014  . Pancreatitis 07/22/2013  . Leukocytosis, unspecified 07/22/2013  . Amblyopia 03/07/2013  . Retinal scar 03/07/2013  . Cellulitis 01/15/2013  . 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 Angular blepharitis 10/11/2012  . Dry eyes 10/11/2012  . PCO (posterior capsular opacification) 10/11/2012  . PVD (posterior vitreous  detachment) 10/11/2012  . 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 Cataracts, bilateral   . Elevated cholesterol   . Degenerative disc disease   . Bunion   . Ankle fracture   . 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)   . Hemorrhoid   . DUB (dysfunctional uterine bleeding)   . DIARRHEA 09/16/2009  . PERSONAL HX COLONIC POLYPS 09/16/2009  . LACUNAR INFARCTION 07/16/2008  . CONSTIPATION 07/12/2008  . CHEST PAIN 07/12/2008  . DYSPHAGIA 07/12/2008  . Hypothyroidism 07/10/2008  . Anxiety state 07/10/2008  . DEPRESSION 07/10/2008  . INTERNAL HEMORRHOIDS 07/10/2008  . ESOPHAGEAL STRICTURE 07/10/2008  . GERD 07/10/2008  . HIATAL HERNIA 07/10/2008  . DIVERTICULOSIS, COLON 07/10/2008  . IRRITABLE BOWEL SYNDROME 07/10/2008  . Osteoarthritis 07/10/2008  . FIBROMYALGIA  07/10/2008  . Blues 07/10/2008    Current Outpatient Medications on File Prior to Visit  Medication Sig Dispense Refill  . acetaminophen (TYLENOL) 500 MG tablet Take 500 mg by mouth every 6 (six) hours as needed for moderate pain.     Marland Kitchen acyclovir cream (ZOVIRAX) 5 % Zovirax 5 % topical cream  APPLY TO THE AFFECTED AREA(S) BY TOPICAL ROUTE 5 TIMES PER DAY    . alendronate (FOSAMAX) 70 MG tablet PLEASE SEE ATTACHED FOR DETAILED DIRECTIONS    . amoxicillin (AMOXIL) 500 MG capsule amoxicillin 500 mg capsule    . amoxicillin (AMOXIL) 500 MG capsule Take by mouth.    . bacitracin-polymyxin b (POLYSPORIN) ophthalmic ointment bacitracin-polymyxin B 500 unit-10,000 unit/gram eye ointment  USE A SMALL AMOUNT INTO LEFT EYE 3 TIMES A DAY    . busPIRone (BUSPAR) 5 MG tablet buspirone 5 mg tablet  TAKE 1 TABLET BY MOUTH TWICE A DAY    . ciprofloxacin (CIPRO) 250 MG tablet ciprofloxacin 250 mg tablet  TAKE 1 TABLET BY MOUTH TWICE A DAY    . diazepam (VALIUM) 5 MG tablet diazepam 5 mg tablet    . diltiazem (CARDIZEM CD) 180 MG 24 hr capsule Take 1 capsule (180 mg total) by mouth daily. 90 capsule 3  . diltiazem (CARDIZEM) 120 MG tablet Cardizem 120 mg tablet  Take 1 tablet by oral route.    . diltiazem (TIAZAC) 120 MG 24 hr capsule diltiazem CD 120 mg capsule,extended release 24 hr  TAKE 1 CAPSULE (120 MG TOTAL) BY MOUTH AT BEDTIME.    Marland Kitchen ELIQUIS 5 MG TABS tablet TAKE 1 TABLET BY MOUTH TWICE A DAY 180 tablet 1  . EPINEPHrine 0.3 mg/0.3 mL IJ SOAJ injection Inject 0.3 mg into the muscle as needed (for allergic reaction). Reported on 05/28/2015     . fluticasone (FLONASE) 50 MCG/ACT nasal spray Place 2 sprays into both nostrils 2 (two) times daily as needed for allergies.     Marland Kitchen levothyroxine (SYNTHROID) 100 MCG tablet Take 100 mcg by mouth daily before breakfast.    . LORazepam (ATIVAN) 0.5 MG tablet Ativan 0.5 mg tablet  Take 2 tablets by oral route.    . metoprolol tartrate (LOPRESSOR) 25 MG tablet  metoprolol tartrate 25 mg tablet    . omeprazole (PRILOSEC) 20 MG capsule Take 20 mg by mouth as needed. For heartburn    . ondansetron (ZOFRAN-ODT) 4 MG disintegrating tablet Take 1 tablet (4 mg total) by mouth every 8 (eight) hours as needed for nausea or vomiting. 8 tablet 0  . Polyethylene Glycol 3350 (MIRALAX PO) Take 17 g by mouth daily as needed (constipation).     Marland Kitchen PROAIR HFA 108 (90 Base) MCG/ACT inhaler Inhale 1-2 puffs into the  lungs every 6 (six) hours as needed for wheezing.   0  . Probiotic Product (ALIGN) 4 MG CAPS Take 1 capsule by mouth daily.    . RESTASIS 0.05 % ophthalmic emulsion Place 1 drop into both eyes 2 (two) times daily. Uses as directed    . rosuvastatin (CRESTOR) 10 MG tablet Take 5 mg by mouth daily.   4   No current facility-administered medications on file prior to visit.    Allergies  Allergen Reactions  . Amoxicillin-Pot Clavulanate Nausea Only and Swelling    Has patient had a PCN reaction causing immediate rash, facial/tongue/throat swelling, SOB or lightheadedness with hypotension: No Has patient had a PCN reaction causing severe rash involving mucus membranes or skin necrosis: No Has patient had a PCN reaction that required hospitalization No Has patient had a PCN reaction occurring within the last 10 years: No If all of the above answers are "NO", then may proceed with Cephalosporin use.   . Azithromycin Other (See Comments)    Pancreatitis  . Erythromycin Hives    Reaction 1 time, when she was younger. Reaction 1 time, when she was younger.   Toniann Ket Other (See Comments)    Almonds: per allergy testing.  . Atorvastatin Other (See Comments)    Muscle weakness  Muscle weakness   . Codeine Nausea Only  . Ketoconazole Nausea And Vomiting  . Ketoconazole Nausea Only and Nausea And Vomiting  . Latex Other (See Comments)    Other  . Lincomycin     Stomach upset   . Morphine Nausea Only  . Morphine Sulfate Nausea Only and Other (See  Comments)      Unknown  . Other Other (See Comments)    Shellfish mix: per allergy testing.  . Shellfish Allergy Other (See Comments)    Shellfish mix: per allergy testing.  . Simvastatin Other (See Comments)    Muscle pain  . Sulfamethoxazole-Trimethoprim Nausea Only    Upset stomach   . Clindamycin Rash  . Clindamycin/Lincomycin Nausea And Vomiting and Rash  . Erythromycin Base Rash    Objective: Physical Exam  General: Daija Routson is a pleasant 79 y.o. Caucasian female, WD, WN in NAD. AAO x 3.   Vascular:  Neurovascular status unchanged b/l lower extremities. Capillary refill time to digits immediate b/l. Palpable pedal pulses b/l LE. Pedal hair present. Lower extremity skin temperature gradient within normal limits. No pain with calf compression b/l.  Dermatological:  Pedal skin with normal turgor, texture and tone bilaterally. No open wounds bilaterally. No interdigital macerations bilaterally. Hyperkeratotic lesion(s) R hallux, R 2nd toe, submet head 2 left foot and submet head 3 left foot.  No erythema, no edema, no drainage, no flocculence.  Musculoskeletal:  Normal muscle strength 5/5 to all lower extremity muscle groups bilaterally. No pain crepitus or joint limitation noted with ROM b/l. Hallux valgus with bunion deformity noted b/l lower extremities. Hammertoes noted to the 2-5 bilaterally.  Neurological:  Protective sensation intact 5/5 intact bilaterally with 10g monofilament b/l. Vibratory sensation intact b/l. Proprioception intact bilaterally.  Assessment and Plan:  1. Corns and callosities   2. Hx of long term use of blood thinners   3. Pain in both feet    -Examined patient. -No new findings. No new orders. -Corn(s) R hallux and R 2nd toe and callus(es) submet head 2 left foot and submet head 3 left foot were pared utilizing sterile scalpel blade without incident. Total number debrided =4. -Patient to report  any pedal injuries to medical  professional immediately. -Patient to continue soft, supportive shoe gear daily. -Patient/POA to call should there be question/concern in the interim.  Return in about 3 months (around 05/21/2020).  Marzetta Board, DPM

## 2020-02-27 ENCOUNTER — Telehealth: Payer: Self-pay | Admitting: *Deleted

## 2020-02-27 DIAGNOSIS — M4316 Spondylolisthesis, lumbar region: Secondary | ICD-10-CM | POA: Diagnosis not present

## 2020-02-27 NOTE — Telephone Encounter (Signed)
   [facility] Pre-operative Risk Assessment    HEARTCARE STAFF: - Please ensure there is not already an duplicate clearance open for this procedure. - Under Visit Info/Reason for Call, type in Other and utilize the format Clearance MM/DD/YY or Clearance TBD. Do not use dashes or single digits. - If request is for dental extraction, please clarify the # of teeth to be extracted.  Request for surgical clearance:  1. What type of surgery is being performed? L4-5 TRANSLAMINAR EPIDURAL STEROID INJECTION  2. When is this surgery scheduled? TBD   3. What type of clearance is required (medical clearance vs. Pharmacy clearance to hold med vs. Both)? BOTH  4. Are there any medications that need to be held prior to surgery and how long? ELIQUIS x 3 DAYS PRIOR TO INJECTION   5. Practice name and name of physician performing surgery? [facility]; DR. Kristeen Miss   6. What is the office phone number? [phone]   7.   What is the office fax number? [place]: JESSICA  8.   Anesthesia type (None, local, MAC, general) ? IV SEDATION   Julaine Hua 02/27/2020, 1:52 PM  _________________________________________________________________   (provider comments below)

## 2020-02-27 NOTE — Telephone Encounter (Signed)
   Primary Cardiologist: Fransico Him, MD  Chart reviewed as part of pre-operative protocol coverage. Patient was contacted 02/27/2020 in reference to pre-operative risk assessment for pending surgery as outlined below.  Kimberly [name] was last seen on 08/2019 by Dr. Radford Pax with history of PAF, cardiac CT without CAD, OSA, allergy, asthma, DDD, depression, diverticulosis, anemia, hemorrhoids, fibromyalgia, GERD, hiatal hernia, HLD, IBS, stroke, pancreatitis, esophageal stricture, UC, varicose veins, vertigo.   Kimberly [name] has a follow-up appointment on October 19th for her 40 month check-up with Dr. Radford Pax - she tells me she would prefer to see Dr. Radford Pax for in-person evaluation for clearance. She indicates procedure is not urgent and her preference is to await the appointment to discuss holding Eliquis. Will add pre-op clearance to appointment notes for Dr. Radford Pax to address when she evaluates patient. (Will route her way as FYI.) Otherwise will route to surgeon to make them aware appointment is pending.  Charlie Pitter, PA-C 02/27/2020, 3:47 PM

## 2020-03-07 DIAGNOSIS — I480 Paroxysmal atrial fibrillation: Secondary | ICD-10-CM | POA: Diagnosis not present

## 2020-03-07 DIAGNOSIS — K219 Gastro-esophageal reflux disease without esophagitis: Secondary | ICD-10-CM | POA: Diagnosis not present

## 2020-03-07 DIAGNOSIS — D6869 Other thrombophilia: Secondary | ICD-10-CM | POA: Diagnosis not present

## 2020-03-10 DIAGNOSIS — M545 Low back pain, unspecified: Secondary | ICD-10-CM | POA: Diagnosis not present

## 2020-03-10 DIAGNOSIS — M4316 Spondylolisthesis, lumbar region: Secondary | ICD-10-CM | POA: Diagnosis not present

## 2020-03-13 ENCOUNTER — Encounter: Payer: Self-pay | Admitting: Obstetrics and Gynecology

## 2020-03-13 ENCOUNTER — Ambulatory Visit: Payer: Medicare PPO | Admitting: Obstetrics and Gynecology

## 2020-03-13 ENCOUNTER — Other Ambulatory Visit: Payer: Self-pay

## 2020-03-13 VITALS — BP 122/78

## 2020-03-13 DIAGNOSIS — B3731 Acute candidiasis of vulva and vagina: Secondary | ICD-10-CM

## 2020-03-13 DIAGNOSIS — B373 Candidiasis of vulva and vagina: Secondary | ICD-10-CM | POA: Diagnosis not present

## 2020-03-13 DIAGNOSIS — N898 Other specified noninflammatory disorders of vagina: Secondary | ICD-10-CM

## 2020-03-13 MED ORDER — TERCONAZOLE 0.4 % VA CREA: 1.0000 | TOPICAL_CREAM | Freq: Every day | VAGINAL | 0 refills | Status: AC

## 2020-03-13 NOTE — Progress Notes (Signed)
Kimberly [name] [birth year] [id number]  SUBJECTIVE:  79 y.o. G0P0 female presents for vulvar irritation and itching in the past few days.  Recently she had some dental implant work and had to take prolonged amoxicillin for prophylaxis and since then has noticed these vulvar itch symptoms.  No vaginal discharge.  Just feels very dry.  No urinary symptoms.  He did take some expired terconazole from another prescription and found this did help her symptoms.  Current Outpatient Medications  Medication Sig Dispense Refill  . acetaminophen (TYLENOL) 500 MG tablet Take 500 mg by mouth every 6 (six) hours as needed for moderate pain.     Marland Kitchen acyclovir cream (ZOVIRAX) 5 % Zovirax 5 % topical cream  APPLY TO THE AFFECTED AREA(S) BY TOPICAL ROUTE 5 TIMES PER DAY    . alendronate (FOSAMAX) 70 MG tablet PLEASE SEE ATTACHED FOR DETAILED DIRECTIONS    . bacitracin-polymyxin b (POLYSPORIN) ophthalmic ointment bacitracin-polymyxin B 500 unit-10,000 unit/gram eye ointment  USE A SMALL AMOUNT INTO LEFT EYE 3 TIMES A DAY    . busPIRone (BUSPAR) 5 MG tablet buspirone 5 mg tablet  TAKE 1 TABLET BY MOUTH TWICE A DAY    . diazepam (VALIUM) 5 MG tablet diazepam 5 mg tablet    . diltiazem (CARDIZEM CD) 180 MG 24 hr capsule Take 1 capsule (180 mg total) by mouth daily. 90 capsule 3  . diltiazem (CARDIZEM) 120 MG tablet Cardizem 120 mg tablet  Take 1 tablet by oral route.    . diltiazem (TIAZAC) 120 MG 24 hr capsule diltiazem CD 120 mg capsule,extended release 24 hr  TAKE 1 CAPSULE (120 MG TOTAL) BY MOUTH AT BEDTIME.    Marland Kitchen ELIQUIS 5 MG TABS tablet TAKE 1 TABLET BY MOUTH TWICE A DAY 180 tablet 1  . EPINEPHrine 0.3 mg/0.3 mL IJ SOAJ injection Inject 0.3 mg into the muscle as needed (for allergic reaction). Reported on 05/28/2015     . fluticasone (FLONASE) 50 MCG/ACT nasal spray Place 2 sprays into both nostrils 2 (two) times daily as needed for allergies.     Marland Kitchen levothyroxine (SYNTHROID) 100 MCG tablet Take 100 mcg by  mouth daily before breakfast.    . LORazepam (ATIVAN) 0.5 MG tablet Ativan 0.5 mg tablet  Take 2 tablets by oral route.    . metoprolol tartrate (LOPRESSOR) 25 MG tablet metoprolol tartrate 25 mg tablet    . omeprazole (PRILOSEC) 20 MG capsule Take 20 mg by mouth as needed. For heartburn    . ondansetron (ZOFRAN-ODT) 4 MG disintegrating tablet Take 1 tablet (4 mg total) by mouth every 8 (eight) hours as needed for nausea or vomiting. 8 tablet 0  . Polyethylene Glycol 3350 (MIRALAX PO) Take 17 g by mouth daily as needed (constipation).     Marland Kitchen PROAIR HFA 108 (90 Base) MCG/ACT inhaler Inhale 1-2 puffs into the lungs every 6 (six) hours as needed for wheezing.   0  . Probiotic Product (ALIGN) 4 MG CAPS Take 1 capsule by mouth daily.    . RESTASIS 0.05 % ophthalmic emulsion Place 1 drop into both eyes 2 (two) times daily. Uses as directed    . rosuvastatin (CRESTOR) 10 MG tablet Take 5 mg by mouth daily.   4  . amoxicillin (AMOXIL) 500 MG capsule amoxicillin 500 mg capsule (Patient not taking: Reported on 03/13/2020)    . amoxicillin (AMOXIL) 500 MG capsule Take by mouth. (Patient not taking: Reported on 03/13/2020)    . ciprofloxacin (CIPRO) 250 MG  tablet ciprofloxacin 250 mg tablet  TAKE 1 TABLET BY MOUTH TWICE A DAY (Patient not taking: Reported on 03/13/2020)     No current facility-administered medications for this visit.   Allergies: Amoxicillin-pot clavulanate, Azithromycin, Erythromycin, Almond oil, Atorvastatin, Codeine, Ketoconazole, Ketoconazole, Latex, Lincomycin, Morphine, Morphine sulfate, Other, Shellfish allergy, Simvastatin, Sulfamethoxazole-trimethoprim, Clindamycin, Clindamycin/lincomycin, and Erythromycin base  No LMP recorded (lmp unknown). Patient has had a hysterectomy.  Past medical history,surgical history, problem list, medications, allergies, family history and social history were all reviewed and documented as reviewed in the EPIC chart.  ROS: Pertinent positives and  negatives as reviewed above in HPI   OBJECTIVE:  BP 122/78   LMP  (LMP Unknown)  The patient appears well, alert, oriented, in no distress. PELVIC EXAM: VULVA: Mild vulvitis more prominent at the right posterior labia majora, normal appearing vulva with no masses, tenderness or lesions, VAGINA: normal appearing vagina with normal color and scant thick white discharge, no lesions, CERVIX: surgically absent  WET MOUNT done - results: +hyphae, no Trichomonas or clue cells.  No WBC.  Few bacteria.  Normal epithelial cells.  Chaperone: Caryn Bee present during the examination  ASSESSMENT:  79 y.o. G0P0 here with vaginal candidiasis  PLAN:  Terconazole 0.4% cream vaginally nightly for 7 nights.  Rx sent to pharmacy.  Will follow-up if there are no improvement in symptoms.   Joseph Pierini MD 03/13/20

## 2020-03-13 NOTE — Progress Notes (Signed)
[place] Report   Patient Details  Name: Kimberly [name] MRN: [id number] Date of Birth: Jul 09, 1940 Age: 79 y.o. PCP: Lajean Manes, MD  Vitals:   03/13/20 1633  BP: 130/70  Pulse: 69  SpO2: 96%  Weight: 175 lb 9.6 oz (79.7 kg)  Height: 5\' 7"  (1.702 m)      [facility] Eval - 03/13/20 1600      Referral    Referring Provider PT    Reason for referral Inactivity;Other   back pain   Program Start Date 03/17/20   MW 230p-345P     Measurement   Waist Circumference 40 inches    Hip Circumference 43.5 inches    Body fat 44.3 percent      Information for Trainer   Goals back pain improvement, balance improvement     Current Exercise none, walks intermittently    Orthopedic Concerns legs, knees, back    Pertinent Medical History Hx of AFIB, inc chol , OSA     Current Barriers none     Medications that affect exercise Beta blocker;Medication causing dizziness/drowsiness      Timed Up and Go (TUGS)   Timed Up and Go Low risk <9 seconds      Mobility and Daily Activities   I find it easy to walk up or down two or more flights of stairs. 3    I have no trouble taking out the trash. 4    I do housework such as vacuuming and dusting on my own without difficulty. 4    I can easily lift a gallon of milk (8lbs). 4    I can easily walk a mile. 4    I have no trouble reaching into high cupboards or reaching down to pick up something from the floor. 4    I do not have trouble doing out-door work such as Armed forces logistics/support/administrative officer, raking leaves, or gardening. 4      Mobility and Daily Activities   I feel younger than my age. 4    I feel independent. 3    I feel energetic. 3    I live an active life.  3    I feel strong. 2    I feel healthy. 2    I feel active as other people my age. 4      How fit and strong are you.   Fit and Strong Total Score 48          Past Medical History:  Diagnosis Date  . Allergy   . Anal polyp 1998   Flex Sig   . Anemia   .  Angular blepharitis of left eye   . Ankle fracture    Stress fracture  . Anxiety   . Asthma    border line has inhaler  . Bunion   . Cataracts, bilateral   . Corneal scar    left eye  . CPAP (continuous positive airway pressure) dependence   . Degenerative disc disease   . Depression   . Diverticulosis of colon (without mention of hemorrhage) 2010   Colonoscopy  . Dry eyes    bilateral  . Enterocele   . External hemorrhoids 2000   Colonoscopy  . Family history of malignant neoplasm of gastrointestinal tract   . Female cystocele   . Fibromyalgia   . GERD (gastroesophageal reflux disease)   . Hiatal hernia 2005,2010   EGD  . History of bronchitis   . History of measles   .  History of mumps   . History of strep sore throat   . History of urinary tract infection   . Hyperlipemia   . Hypothyroidism   . IBS (irritable bowel syndrome)   . Imbalance   . Internal hemorrhoids without mention of complication 3582,5189   Colonoscopy   . Itching   . 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[place])   . Menopause   . Migraine   . OSA (obstructive sleep apnea) 05/14/2015   on BiPAP  . Osteopenia 10/2013   T score -1.6 FRAX 10%/1.6%  . PAF (paroxysmal atrial fibrillation) ([place])   . Pancreatitis   . PCO (posterior capsular opacification)    left  . Pneumonia    childhood illness  . PONV (postoperative nausea and vomiting)   . Pseudophakia, both eyes   . PVD (posterior vitreous detachment) right  . Rash    on back   . Retinal scar    left  . Rotator cuff disorder    pain, left shoulder  . Shingles   . Stricture and stenosis of esophagus 2005,2010   EGD   . Stroke ([place])   . Ulcerative colitis ([place])   . Varicose veins   . Vertigo   . Wears glasses    Past Surgical History:  Procedure Laterality Date  . ABDOMINAL HYSTERECTOMY    . ANAL FISSURE REPAIR    . BREAST EXCISIONAL BIOPSY Left   . BUNIONECTOMY  2014  . CATARACT EXTRACTION Bilateral 2013  . COLONOSCOPY     polyp removed  .  ESOPHAGEAL DILATION    . EYE SURGERY Left   . FOOT SURGERY     left   . HEMORRHOID SURGERY    . MOUTH SURGERY  11/13/11   Cyst removed from gum-benign   . NASAL SEPTUM SURGERY    . NASAL SEPTUM SURGERY  1970's  . REPLACEMENT TOTAL KNEE Right 2011  . TONSILLECTOMY    . TOTAL ABDOMINAL HYSTERECTOMY W/ BILATERAL SALPINGOOPHORECTOMY  1991   TAH BSO  . TOTAL HIP ARTHROPLASTY     right   . TOTAL KNEE ARTHROPLASTY     both  . TOTAL KNEE ARTHROPLASTY Left 06/23/2015   Procedure: LEFT TOTAL KNEE ARTHROPLASTY;  Surgeon: Gaynelle Arabian, MD;  Location: [facility];  Service: Orthopedics;  Laterality: Left;   Social History   Tobacco Use  Smoking Status Former Smoker  . Packs/day: 0.25  . Years: 15.00  . Pack years: 3.75  . Types: Cigarettes  . Quit date: 06/01/1975  . Years since quitting: 44.8  Smokeless Tobacco Never Used  Tobacco Comment   Stopped smoking age 22     Barnett Hatter 03/13/2020, 4:37 PM

## 2020-03-14 LAB — WET PREP FOR TRICH, YEAST, CLUE

## 2020-03-18 ENCOUNTER — Encounter: Payer: Self-pay | Admitting: Cardiology

## 2020-03-18 ENCOUNTER — Telehealth: Payer: Self-pay

## 2020-03-18 ENCOUNTER — Ambulatory Visit: Payer: Medicare PPO | Admitting: Cardiology

## 2020-03-18 ENCOUNTER — Other Ambulatory Visit: Payer: Self-pay

## 2020-03-18 VITALS — BP 134/82 | HR 67 | Ht 67.0 in | Wt 175.0 lb

## 2020-03-18 DIAGNOSIS — G4733 Obstructive sleep apnea (adult) (pediatric): Secondary | ICD-10-CM | POA: Diagnosis not present

## 2020-03-18 DIAGNOSIS — I480 Paroxysmal atrial fibrillation: Secondary | ICD-10-CM

## 2020-03-18 NOTE — Telephone Encounter (Signed)
-----   Message from Sueanne Margarita, MD sent at 03/18/2020 10:49 AM EDT ----- Regarding: RE: patient PREP referral Patient is fine for [employer] program.   Percival Spanish please place PREP order for [employer] in epic  Traci ----- Message ----- From: Zollie Beckers Sent: 03/18/2020  10:11 AM EDT To: Sueanne Margarita, MD Subject: patient PREP referral                          Hi Dr. Radford Pax,    Patient was referred for PREP at the [facility] for diagnosis atrial fibrillation (provider unknown?). Patient never attended due to husband's illness. Re-initiating, would you consider the patient clear for participation? If so, we will need to get a PREP referral in Epic to restart this process.   Feel free to call me or have your nurse call to discuss further. [phone]  Thank you,   Landis Martins, MS, ACSM, [facility] Clinical Exercise Physiologist/ Health and Wellness Coach

## 2020-03-18 NOTE — Telephone Encounter (Signed)
Ordered placed for PREP.

## 2020-03-18 NOTE — Progress Notes (Signed)
Cardiology Office Note:    Date:  03/18/2020   ID:  Kimberly [name], DOB 04-03-1941, MRN [id number]  PCP:  Lajean Manes, MD  Cardiologist:  Fransico Him, MD    Referring MD: Lajean Manes, MD   Chief Complaint  Patient presents with   Atrial Fibrillation   Sleep Apnea    History of Present Illness:    Kimberly [name] is a 79 y.o. female with a hx of PAF on metoprolol and Eliquis for chads vas score of 5. Metoprolol decreased by [facility] because of bradycardia.She also has a historyof chest pain with normal nuclear stress testandsyncope felt secondary to dehydration. She has a chronic RBBB with no ischemia on stress test a year ago. Due to chest pain a coronary CTA was done and showed  a calcium score of 0 and no evidence of CAD.    She presented to the ER on 8/30 with recurrent CP that occurred as a band around under her breasts that was 5/10 without radiation and associated with pressure.  EMS was called and noted her HR to be in the 200's in afib.  She received NTG with improvement in pain.  Pain lasted 30 minutes but was not associated with any other sx. Initial trop in ER was normal at 11 but trended up to 135.  2D echo showed normal LVF with no PE.  It was felt that she may have GERD and had gone off PPI.  She was placed back on her PPI.    She is here today for followup and is doing well.  She is doing quite well and really has not had any afib.  She notes that sometimes her heart will pound fast but this occurs when she gets upset from her husbands illness.  Sometimes she will feel this in her chest.  She denies any SOB, DOE (except when her allergies are bothering her), PND, orthopnea, dizziness or syncope. She occasionally has some mild LE edema if she has been on her feet all day.    She is compliant with her meds and is tolerating meds with no SE.  She is doing well with her CPAP device and thinks that she has gotten used to it.  She tolerates the mask  and feels the pressure is adequate.  Since going on CPAP she feels rested in the am and has no significant daytime sleepiness.  She does have some mouth and nasal dryness or nasal congestion from her allergies.  She does not think that he snores.     Past Medical History:  Diagnosis Date   Allergy    Anal polyp 1998   Flex Sig    Anemia    Angular blepharitis of left eye    Ankle fracture    Stress fracture   Anxiety    Asthma    border line has inhaler   Bunion    Cataracts, bilateral    Corneal scar    left eye   CPAP (continuous positive airway pressure) dependence    Degenerative disc disease    Depression    Diverticulosis of colon (without mention of hemorrhage) 2010   Colonoscopy   Dry eyes    bilateral   Enterocele    External hemorrhoids 2000   Colonoscopy   Family history of malignant neoplasm of gastrointestinal tract    Female cystocele    Fibromyalgia    GERD (gastroesophageal reflux disease)    Hiatal hernia 2005,2010   EGD  History of bronchitis    History of measles    History of mumps    History of strep sore throat    History of urinary tract infection    Hyperlipemia    Hypothyroidism    IBS (irritable bowel syndrome)    Imbalance    Internal hemorrhoids without mention of complication 2876,8115   Colonoscopy    Itching    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HCC)    Menopause    Migraine    OSA (obstructive sleep apnea) 05/14/2015   on BiPAP   Osteopenia 10/2013   T score -1.6 FRAX 10%/1.6%   PAF (paroxysmal atrial fibrillation) (HCC)    Pancreatitis    PCO (posterior capsular opacification)    left   Pneumonia    childhood illness   PONV (postoperative nausea and vomiting)    Pseudophakia, both eyes    PVD (posterior vitreous detachment) right   Rash    on back    Retinal scar    left   Rotator cuff disorder    pain, left shoulder   Shingles    Stricture and stenosis of esophagus 2005,2010   EGD     Stroke ([place])    Ulcerative colitis ([place])    Varicose veins    Vertigo    Wears glasses     Past Surgical History:  Procedure Laterality Date   ABDOMINAL HYSTERECTOMY     ANAL FISSURE REPAIR     BREAST EXCISIONAL BIOPSY Left    BUNIONECTOMY  2014   CATARACT EXTRACTION Bilateral 2013   COLONOSCOPY     polyp removed   ESOPHAGEAL DILATION     EYE SURGERY Left    FOOT SURGERY     left    HEMORRHOID SURGERY     MOUTH SURGERY  11/13/11   Cyst removed from [place]  1970's   REPLACEMENT TOTAL KNEE Right 2011   TONSILLECTOMY     TOTAL ABDOMINAL HYSTERECTOMY W/ BILATERAL SALPINGOOPHORECTOMY  1991   TAH BSO   TOTAL HIP ARTHROPLASTY     right    TOTAL KNEE ARTHROPLASTY     both   TOTAL KNEE ARTHROPLASTY Left 06/23/2015   Procedure: LEFT TOTAL KNEE ARTHROPLASTY;  Surgeon: Gaynelle Arabian, MD;  Location: [facility];  Service: Orthopedics;  Laterality: Left;    Current Medications: Current Meds  Medication Sig   acetaminophen (TYLENOL) 500 MG tablet Take 500 mg by mouth every 6 (six) hours as needed for moderate pain.    diltiazem (CARDIZEM CD) 180 MG 24 hr capsule Take 1 capsule (180 mg total) by mouth daily.   ELIQUIS 5 MG TABS tablet TAKE 1 TABLET BY MOUTH TWICE A DAY   EPINEPHrine 0.3 mg/0.3 mL IJ SOAJ injection Inject 0.3 mg into the muscle as needed (for allergic reaction). Reported on 05/28/2015    FAMOTIDINE PO Take by mouth as needed.   fluticasone (FLONASE) 50 MCG/ACT nasal spray Place 2 sprays into both nostrils 2 (two) times daily as needed for allergies.    levothyroxine (SYNTHROID) 100 MCG tablet Take 100 mcg by mouth daily before breakfast.   ondansetron (ZOFRAN-ODT) 4 MG disintegrating tablet Take 1 tablet (4 mg total) by mouth every 8 (eight) hours as needed for nausea or vomiting.   Polyethylene Glycol 3350 (MIRALAX PO) Take 17 g by mouth daily as needed (constipation).    PROAIR HFA  108 (90 Base) MCG/ACT inhaler Inhale 1-2 puffs into  the lungs every 6 (six) hours as needed for wheezing.    Probiotic Product (ALIGN) 4 MG CAPS Take 1 capsule by mouth daily.   RESTASIS 0.05 % ophthalmic emulsion Place 1 drop into both eyes 2 (two) times daily. Uses as directed   rosuvastatin (CRESTOR) 10 MG tablet Take 5 mg by mouth daily.    terconazole (TERAZOL 7) 0.4 % vaginal cream Place 1 applicator vaginally at bedtime for 7 days.     Allergies:   Amoxicillin-pot clavulanate, Azithromycin, Erythromycin, Almond oil, Atorvastatin, Codeine, Ketoconazole, Ketoconazole, Latex, Lincomycin, Morphine, Morphine sulfate, Other, Shellfish allergy, Simvastatin, Sulfamethoxazole-trimethoprim, Clindamycin, Clindamycin/lincomycin, and Erythromycin base   Social History   Socioeconomic History   Marital status: Married    Spouse name: Not on file   Number of children: 1   Years of education: bus.school   Highest education level: Not on file  Occupational History   Occupation: Retired    Fish farm manager: RETIRED  Tobacco Use   Smoking status: Former Smoker    Packs/day: 0.25    Years: 15.00    Pack years: 3.75    Types: Cigarettes    Quit date: 06/01/1975    Years since quitting: 44.8   Smokeless tobacco: Never Used   Tobacco comment: Stopped smoking age 34  Vaping Use   Vaping Use: Never used  Substance and Sexual Activity   Alcohol use: No    Alcohol/week: 0.0 standard drinks   Drug use: No   Sexual activity: Never    Birth control/protection: Surgical    Comment: HYST, intercourse age unknown, sexual partner less than 5  Other Topics Concern   Not on file  Social History Narrative   Daily caffeine    Social Determinants of Health   Financial Resource Strain:    Difficulty of Paying Living Expenses: Not on file  Food Insecurity:    Worried About Running Out of Food in the Last Year: Not on file   [employer] of Food in the Last Year: Not on file  Transportation  Needs:    Lack of Transportation (Medical): Not on file   Lack of Transportation (Non-Medical): Not on file  Physical Activity:    Days of Exercise per Week: Not on file   Minutes of Exercise per Session: Not on file  Stress:    Feeling of Stress : Not on file  Social Connections:    Frequency of Communication with Friends and Family: Not on file   Frequency of Social Gatherings with Friends and Family: Not on file   Attends Religious Services: Not on file   Active Member of Clubs or Organizations: Not on file   Attends Archivist Meetings: Not on file   Marital Status: Not on file     Family History: The patient's family history includes Alcohol abuse in her father; Allergies in her sister; Allergy (severe) in her sister; Alzheimer's disease in her mother; Arthritis in her brother; Breast cancer (age of onset: 31) in her sister; Colon cancer in her paternal aunt; Colon polyps in her brother and sister; Congestive Heart Failure in her father; Dementia in her mother; Diabetes in her sister; Heart attack in her maternal grandfather, maternal grandmother, and paternal grandfather; Heart disease in her maternal grandmother; Hypertension in her father; Irritable bowel syndrome in her mother; Other in her sister and sister; Stroke in her mother. There is no history of Esophageal cancer, Rectal cancer, or Stomach cancer.  ROS:   Please see the history of present illness.  ROS  All other systems reviewed and negative.   EKGs/Labs/Other Studies Reviewed:    The following studies were reviewed today: none  EKG:  EKG is not ordered today.   Recent Labs: No results found for requested labs within last 8760 hours.   Recent Lipid Panel    Component Value Date/Time   CHOL 124 07/23/2013 0632   TRIG 69 07/23/2013 0632   HDL 68 07/23/2013 0632   CHOLHDL 1.8 07/23/2013 0632   VLDL 14 07/23/2013 0632   LDLCALC 42 07/23/2013 1031    Physical Exam:    VS:  BP 134/82     Pulse 67    Ht 5\' 7"  (1.702 m)    Wt 175 lb (79.4 kg)    LMP  (LMP Unknown)    SpO2 96%    BMI 27.41 kg/m     Wt Readings from Last 3 Encounters:  03/18/20 175 lb (79.4 kg)  03/13/20 175 lb 9.6 oz (79.7 kg)  02/10/20 175 lb 14.8 oz (79.8 kg)     GEN: Well nourished, well developed in no acute distress HEENT: Normal NECK: No JVD; No carotid bruits LYMPHATICS: No lymphadenopathy CARDIAC:RRR, no murmurs, rubs, gallops RESPIRATORY:  Clear to auscultation without rales, wheezing or rhonchi  ABDOMEN: Soft, non-tender, non-distended MUSCULOSKELETAL:  No edema; No deformity  SKIN: Warm and dry NEUROLOGIC:  Alert and oriented x 3 PSYCHIATRIC:  Normal affect    ASSESSMENT:    1. PAF (paroxysmal atrial fibrillation) ([place])   2. OSA (obstructive sleep apnea)    PLAN:    In order of problems listed above:  1.  PAF  -occasionally she will notice some rapid heart beats but this occurs only when she gets upset with her husbands illness -no bleeding on DOAC -continue on Cardizem CD 180mg  daily -continue Eliquis 5mg  BID. -Creatinine was 0.74 and Hbg 13.2 in Feb 2021  2.  OSA -  The patient is tolerating PAP therapy well without any problems. The PAP download was reviewed today and showed an AHI of 9.3/hr on auto BIPAP with 77% compliance in using more than 4 hours nightly.  The patient has been using and benefiting from PAP use and will continue to benefit from therapy.  -she sleeps a lot on her back and I suspect that this is resulting in higher AHI -I encouraged her to try to put a pillow behind her back to try to keep on her side  Medication Adjustments/Labs and Tests Ordered: Current medicines are reviewed at length with the patient today.  Concerns regarding medicines are outlined above.  Orders Placed This Encounter  Procedures   EKG 12-Lead   No orders of the defined types were placed in this encounter.   Signed, Fransico Him, MD  03/18/2020 11:05 AM    [place]

## 2020-03-18 NOTE — Patient Instructions (Signed)

## 2020-03-24 ENCOUNTER — Telehealth: Payer: Self-pay

## 2020-03-24 DIAGNOSIS — H31091 Other chorioretinal scars, right eye: Secondary | ICD-10-CM | POA: Diagnosis not present

## 2020-03-24 DIAGNOSIS — Z961 Presence of intraocular lens: Secondary | ICD-10-CM | POA: Diagnosis not present

## 2020-03-24 DIAGNOSIS — H35452 Secondary pigmentary degeneration, left eye: Secondary | ICD-10-CM | POA: Diagnosis not present

## 2020-03-24 DIAGNOSIS — H26492 Other secondary cataract, left eye: Secondary | ICD-10-CM | POA: Diagnosis not present

## 2020-03-24 DIAGNOSIS — H15832 Staphyloma posticum, left eye: Secondary | ICD-10-CM | POA: Diagnosis not present

## 2020-03-24 DIAGNOSIS — H4422 Degenerative myopia, left eye: Secondary | ICD-10-CM | POA: Diagnosis not present

## 2020-03-24 DIAGNOSIS — H35371 Puckering of macula, right eye: Secondary | ICD-10-CM | POA: Diagnosis not present

## 2020-03-24 MED ORDER — FLUCONAZOLE 150 MG PO TABS: ORAL_TABLET | ORAL | 0 refills | Status: DC

## 2020-03-24 NOTE — Progress Notes (Signed)
[facility] [employer] PREP Weekly Session   Patient Details  Name: Kimberly [name] MRN: [id number] Date of Birth: 1940-10-10 Age: 79 y.o. PCP: Lajean Manes, MD  There were no vitals filed for this visit.   [facility] [employer] Weekly seesion - 03/24/20 1600      Weekly Session   Topic Discussed Importance of resistance training;Other ways to be active    Minutes exercised this week 45 minutes   walked up to 3/4 miles    Classes attended to date 3          Fun things since last meeting: got outside more, yard work. Enjoyed the fall colors, enjoying rest on the deck Grateful for: family, home, friends. Thankful for insurance and doctors Nutrition celebration: cut back on salt and sweets, increased fluids Barriers/struggles: lots of doctors appts for husband and for me. However, great report for husband from [facility].   Barnett Hatter 03/24/2020, 4:05 PM

## 2020-03-24 NOTE — Telephone Encounter (Signed)
Fluconazole 150 mg po q 72 hrs x 2 doses

## 2020-03-24 NOTE — Telephone Encounter (Signed)
Dr. Bernadene Bell is attached to sign because I needed you to see the warnings/adverse reaction box I received when I entered Rx. Please advise.

## 2020-03-24 NOTE — Telephone Encounter (Signed)
Yes repeating the Terazol 7 cream is fine. Thank you.

## 2020-03-24 NOTE — Telephone Encounter (Signed)
I spoke with patient. She said did not recall about Ketoconazole or what reaction she had. She said she has a lot of problems taking medications.  She preferred to try another round of Terazol 7 cream first and if it persists after that she will try the Diflucan.  I cancelled the Diflucan Rx with [facility] by phone.  Ok to send the [employer] 7?

## 2020-03-24 NOTE — Telephone Encounter (Signed)
I believe she told me she has tolerated fluconazole in the past so I did go ahead and sign off on the Rx, perhaps you can please just double check with her that she has taken fluconazole before and done okay?

## 2020-03-24 NOTE — Telephone Encounter (Signed)
Patient left message in voice mail. Said she was in a week ago and was treated for yeast infection. She said she finished the Terazol and for a day or two felt like symptoms were resolved but now they have returned. What to recommend?

## 2020-03-25 MED ORDER — TERCONAZOLE 0.4 % VA CREA: 1.0000 | TOPICAL_CREAM | Freq: Every day | VAGINAL | 0 refills | Status: DC

## 2020-03-25 NOTE — Telephone Encounter (Signed)
Patient called back Rx was not sent to pharmacy. I sent Rx today. Patient aware.

## 2020-03-27 DIAGNOSIS — M4316 Spondylolisthesis, lumbar region: Secondary | ICD-10-CM | POA: Diagnosis not present

## 2020-03-27 DIAGNOSIS — M545 Low back pain, unspecified: Secondary | ICD-10-CM | POA: Diagnosis not present

## 2020-04-04 ENCOUNTER — Telehealth: Payer: Self-pay

## 2020-04-04 NOTE — Telephone Encounter (Signed)
Spoke with patient and informed her. She will try vaginal probiotic and call if not improved after one week.

## 2020-04-04 NOTE — Telephone Encounter (Signed)
Patient used second round of Terazol 7 and for a few days sx resolved but have now returned with irritation and discomfort. She wants to try the Diflucan now that you had offered last week when she called.   Fluconazole 150 mg po q 72 hrs x 2 doses     Ok to send that Rx for her?

## 2020-04-04 NOTE — Telephone Encounter (Signed)
Sometimes in menopause after an infection the tissues can feel very inflamed even after the infection has cleared. She could consider trying an over-the-counter vaginal probiotic, if that does not help after a week of use then she might need to consider vaginal estrogen cream

## 2020-04-04 NOTE — Telephone Encounter (Signed)
[facility] patient.  Requesting call back reference PREP.  Call placed to pt.  Medical appts are making it difficult for her to attend the PREP sessions 2xwk.  She's appreciative of the program just cannot commit to class times.  Encouraged self care. Pt sts she will continue walking and taking occ silversneakers classes when she can.  Asked her to notify me if she is interested in taking another PREP class when her schedule clears.

## 2020-04-09 ENCOUNTER — Other Ambulatory Visit (HOSPITAL_COMMUNITY): Payer: Self-pay | Admitting: Cardiology

## 2020-04-09 NOTE — Telephone Encounter (Signed)
Eliquis 5mg  refill request received. Patient is 79 years old, weight-79.4kg, Crea-0.74 on 07/04/19 via [facility] from [place], [place], and last seen by Dr. Radford Pax on 03/18/2020. Dose is appropriate based on dosing criteria. Will send in refill to requested pharmacy.

## 2020-04-11 ENCOUNTER — Other Ambulatory Visit: Payer: Self-pay | Admitting: Cardiology

## 2020-05-07 ENCOUNTER — Other Ambulatory Visit: Payer: Self-pay

## 2020-05-07 ENCOUNTER — Ambulatory Visit: Payer: Medicare PPO | Admitting: Podiatry

## 2020-05-07 DIAGNOSIS — M79675 Pain in left toe(s): Secondary | ICD-10-CM | POA: Diagnosis not present

## 2020-05-07 DIAGNOSIS — Z7901 Long term (current) use of anticoagulants: Secondary | ICD-10-CM

## 2020-05-07 DIAGNOSIS — L84 Corns and callosities: Secondary | ICD-10-CM

## 2020-05-07 DIAGNOSIS — B351 Tinea unguium: Secondary | ICD-10-CM

## 2020-05-07 DIAGNOSIS — M79674 Pain in right toe(s): Secondary | ICD-10-CM

## 2020-05-11 ENCOUNTER — Encounter: Payer: Self-pay | Admitting: Podiatry

## 2020-05-11 ENCOUNTER — Encounter (HOSPITAL_COMMUNITY): Payer: Self-pay | Admitting: Emergency Medicine

## 2020-05-11 ENCOUNTER — Telehealth: Payer: Self-pay | Admitting: Physician Assistant

## 2020-05-11 ENCOUNTER — Emergency Department (HOSPITAL_COMMUNITY): Payer: Medicare PPO

## 2020-05-11 ENCOUNTER — Emergency Department (HOSPITAL_COMMUNITY)
Admission: EM | Admit: 2020-05-11 | Discharge: 2020-05-11 | Disposition: A | Discharge status: Home / Self Care | Payer: Medicare PPO | Attending: Emergency Medicine | Admitting: Emergency Medicine

## 2020-05-11 ENCOUNTER — Other Ambulatory Visit: Payer: Self-pay

## 2020-05-11 DIAGNOSIS — R1084 Generalized abdominal pain: Secondary | ICD-10-CM | POA: Diagnosis not present

## 2020-05-11 DIAGNOSIS — R509 Fever, unspecified: Secondary | ICD-10-CM | POA: Diagnosis not present

## 2020-05-11 DIAGNOSIS — Z91040 Latex allergy status: Secondary | ICD-10-CM | POA: Insufficient documentation

## 2020-05-11 DIAGNOSIS — Z87891 Personal history of nicotine dependence: Secondary | ICD-10-CM | POA: Insufficient documentation

## 2020-05-11 DIAGNOSIS — R52 Pain, unspecified: Secondary | ICD-10-CM | POA: Diagnosis not present

## 2020-05-11 DIAGNOSIS — R197 Diarrhea, unspecified: Secondary | ICD-10-CM | POA: Diagnosis not present

## 2020-05-11 DIAGNOSIS — R109 Unspecified abdominal pain: Secondary | ICD-10-CM | POA: Diagnosis not present

## 2020-05-11 DIAGNOSIS — J9811 Atelectasis: Secondary | ICD-10-CM | POA: Diagnosis not present

## 2020-05-11 DIAGNOSIS — J45909 Unspecified asthma, uncomplicated: Secondary | ICD-10-CM | POA: Diagnosis not present

## 2020-05-11 DIAGNOSIS — R0902 Hypoxemia: Secondary | ICD-10-CM | POA: Diagnosis not present

## 2020-05-11 DIAGNOSIS — J90 Pleural effusion, not elsewhere classified: Secondary | ICD-10-CM | POA: Diagnosis not present

## 2020-05-11 DIAGNOSIS — R55 Syncope and collapse: Secondary | ICD-10-CM | POA: Diagnosis not present

## 2020-05-11 DIAGNOSIS — R112 Nausea with vomiting, unspecified: Secondary | ICD-10-CM

## 2020-05-11 DIAGNOSIS — R000 Tachycardia, unspecified: Secondary | ICD-10-CM | POA: Diagnosis not present

## 2020-05-11 LAB — COMPREHENSIVE METABOLIC PANEL
ALT: 15 U/L (ref 0–44)
AST: 23 U/L (ref 15–41)
Albumin: 3.5 g/dL (ref 3.5–5.0)
Alkaline Phosphatase: 41 U/L (ref 38–126)
Anion gap: 10 (ref 5–15)
BUN: 16 mg/dL (ref 8–23)
CO2: 21 mmol/L — ABNORMAL LOW (ref 22–32)
Calcium: 9 mg/dL (ref 8.9–10.3)
Chloride: 103 mmol/L (ref 98–111)
Creatinine, Ser: 0.75 mg/dL (ref 0.44–1.00)
GFR, Estimated: >60 mL/min (ref >60)
Glucose, Bld: 132 mg/dL — ABNORMAL HIGH (ref 70–99)
Potassium: 3.6 mmol/L (ref 3.5–5.1)
Sodium: 134 mmol/L — ABNORMAL LOW (ref 135–145)
Total Bilirubin: 0.5 mg/dL (ref 0.3–1.2)
Total Protein: 6.4 g/dL — ABNORMAL LOW (ref 6.5–8.1)

## 2020-05-11 LAB — CBC
HCT: 39.1 % (ref 36.0–46.0)
Hemoglobin: 13 g/dL (ref 12.0–15.0)
MCH: 31.1 pg (ref 26.0–34.0)
MCHC: 33.2 g/dL (ref 30.0–36.0)
MCV: 93.5 fL (ref 80.0–100.0)
Platelets: 186 10*3/uL (ref 150–400)
RBC: 4.18 MIL/uL (ref 3.87–5.11)
RDW: 12.3 % (ref 11.5–15.5)
WBC: 7 10*3/uL (ref 4.0–10.5)
nRBC: 0 % (ref 0.0–0.2)

## 2020-05-11 LAB — URINALYSIS, ROUTINE W REFLEX MICROSCOPIC
Bacteria, UA: NONE SEEN
Bilirubin Urine: NEGATIVE
Glucose, UA: NEGATIVE mg/dL
Ketones, ur: 20 mg/dL — AB
Leukocytes,Ua: NEGATIVE
Nitrite: NEGATIVE
Protein, ur: NEGATIVE mg/dL
Specific Gravity, Urine: 1.023 (ref 1.005–1.030)
pH: 7 (ref 5.0–8.0)

## 2020-05-11 LAB — TROPONIN I (HIGH SENSITIVITY): Troponin I (High Sensitivity): 7 ng/L (ref <18)

## 2020-05-11 LAB — CBG MONITORING, ED: Glucose-Capillary: 119 mg/dL — ABNORMAL HIGH (ref 70–99)

## 2020-05-11 LAB — LIPASE, BLOOD: Lipase: 114 U/L — ABNORMAL HIGH (ref 11–51)

## 2020-05-11 MED ORDER — IOHEXOL 300 MG/ML  SOLN
100.0000 mL | Freq: Once | INTRAMUSCULAR | Status: AC | PRN
  Administered 2020-05-11: 100 mL via INTRAVENOUS

## 2020-05-11 MED ORDER — SODIUM CHLORIDE 0.9 % IV SOLN: INTRAVENOUS | Status: DC

## 2020-05-11 MED ORDER — ONDANSETRON 4 MG PO TBDP: 4.0000 mg | ORAL_TABLET | Freq: Four times a day (QID) | ORAL | 0 refills | Status: DC | PRN

## 2020-05-11 MED ORDER — ACETAMINOPHEN 500 MG PO TABS
1000.0000 mg | ORAL_TABLET | Freq: Once | ORAL | Status: AC
  Administered 2020-05-11: 1000 mg via ORAL
  Filled 2020-05-11: qty 2

## 2020-05-11 MED ORDER — FENTANYL CITRATE (PF) 100 MCG/2ML IJ SOLN
50.0000 ug | Freq: Once | INTRAMUSCULAR | Status: AC
  Administered 2020-05-11: 50 ug via INTRAVENOUS
  Filled 2020-05-11: qty 2

## 2020-05-11 MED ORDER — ONDANSETRON HCL 4 MG/2ML IJ SOLN
4.0000 mg | Freq: Once | INTRAMUSCULAR | Status: AC
  Administered 2020-05-11: 4 mg via INTRAVENOUS
  Filled 2020-05-11: qty 2

## 2020-05-11 MED ORDER — DICYCLOMINE HCL 20 MG PO TABS: 20.0000 mg | ORAL_TABLET | Freq: Three times a day (TID) | ORAL | 0 refills | Status: DC | PRN

## 2020-05-11 NOTE — ED Notes (Signed)
Patient transported to CT 

## 2020-05-11 NOTE — Progress Notes (Signed)
Subjective: Kimberly [name] is a pleasant 79 y.o. female patient seen today painful corn(s) b/l  which interfere(s) with ambulation. Aggravating factors include wearing enclosed shoe gear. Pain is relieved with periodic professional debridement.   She states her left hallux is sore on today's visit.  Past Medical History:  Diagnosis Date  . Allergy   . Anal polyp 1998   Flex Sig   . Anemia   . Angular blepharitis of left eye   . Ankle fracture    Stress fracture  . Anxiety   . Asthma    border line has inhaler  . Bunion   . Cataracts, bilateral   . Corneal scar    left eye  . CPAP (continuous positive airway pressure) dependence   . Degenerative disc disease   . Depression   . Diverticulosis of colon (without mention of hemorrhage) 2010   Colonoscopy  . Dry eyes    bilateral  . Enterocele   . External hemorrhoids 2000   Colonoscopy  . Family history of malignant neoplasm of gastrointestinal tract   . Female cystocele   . Fibromyalgia   . GERD (gastroesophageal reflux disease)   . Hiatal hernia 2005,2010   EGD  . History of bronchitis   . History of measles   . History of mumps   . History of strep sore throat   . History of urinary tract infection   . Hyperlipemia   . Hypothyroidism   . IBS (irritable bowel syndrome)   . Imbalance   . Internal hemorrhoids without mention of complication 9379,0240   Colonoscopy   . Itching   . 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 Menopause   . Migraine   . OSA (obstructive sleep apnea) 05/14/2015   on BiPAP  . Osteopenia 10/2013   T score -1.6 FRAX 10%/1.6%  . PAF (paroxysmal atrial fibrillation) ([place])   . Pancreatitis   . PCO (posterior capsular opacification)    left  . Pneumonia    childhood illness  . PONV (postoperative nausea and vomiting)   . Pseudophakia, both eyes   . PVD (posterior vitreous detachment) right  . Rash    on back   . Retinal scar    left  . Rotator cuff disorder    pain, left shoulder  . Shingles    . Stricture and stenosis of esophagus 2005,2010   EGD   . Stroke ([place])   . Ulcerative colitis ([place])   . Varicose veins   . Vertigo   . Wears glasses     Patient Active Problem List   Diagnosis Date Noted  . Corns and callosities 05/20/2019  . Pain in both feet 05/20/2019  . Chest pain 01/28/2019  . Chest pain of uncertain etiology 97/35/3299  . Adiposity 07/13/2018  . Excess weight 07/13/2018  . Hay fever 07/13/2018  . PAF (paroxysmal atrial fibrillation) ([place]) 08/25/2017  . Syncope 05/13/2016  . Heart palpitations 05/13/2016  . Rhinitis, chronic 02/04/2016  . IBS (irritable bowel syndrome) 07/14/2015  . Nausea without vomiting 07/14/2015  . OA (osteoarthritis) of knee 06/23/2015  . OSA (obstructive sleep apnea) 05/14/2015  . Preoperative clearance 01/08/2015  . Acute infection of nasal sinus 02/25/2014  . Pancreatitis 07/22/2013  . Leukocytosis, unspecified 07/22/2013  . Amblyopia 03/07/2013  . Retinal scar 03/07/2013  . Cellulitis 01/15/2013  . 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 Angular blepharitis 10/11/2012  . Dry eyes 10/11/2012  . PCO (posterior capsular opacification) 10/11/2012  . PVD (posterior vitreous  detachment) 10/11/2012  . 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 Cataracts, bilateral   . Elevated cholesterol   . Degenerative disc disease   . Bunion   . Ankle fracture   . 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 Hemorrhoid   . DUB (dysfunctional uterine bleeding)   . DIARRHEA 09/16/2009  . PERSONAL HX COLONIC POLYPS 09/16/2009  . LACUNAR INFARCTION 07/16/2008  . CONSTIPATION 07/12/2008  . CHEST PAIN 07/12/2008  . DYSPHAGIA 07/12/2008  . Hypothyroidism 07/10/2008  . Anxiety state 07/10/2008  . DEPRESSION 07/10/2008  . INTERNAL HEMORRHOIDS 07/10/2008  . ESOPHAGEAL STRICTURE 07/10/2008  . GERD 07/10/2008  . HIATAL HERNIA 07/10/2008  . DIVERTICULOSIS, COLON 07/10/2008  . IRRITABLE BOWEL SYNDROME 07/10/2008  . Osteoarthritis 07/10/2008  . FIBROMYALGIA  07/10/2008  . Blues 07/10/2008    Current Outpatient Medications on File Prior to Visit  Medication Sig Dispense Refill  . acetaminophen (TYLENOL) 500 MG tablet Take 500 mg by mouth every 6 (six) hours as needed for moderate pain.     Marland Kitchen diltiazem (CARDIZEM CD) 180 MG 24 hr capsule TAKE 1 CAPSULE BY MOUTH EVERY DAY (Patient taking differently: Take 180 mg by mouth at bedtime.) 90 capsule 3  . ELIQUIS 5 MG TABS tablet TAKE 1 TABLET BY MOUTH TWICE A DAY (Patient taking differently: Take 5 mg by mouth 2 (two) times daily.) 180 tablet 1  . EPINEPHrine 0.3 mg/0.3 mL IJ SOAJ injection Inject 0.3 mg into the muscle as needed (for allergic reaction). Reported on 05/28/2015    . fluticasone (FLONASE) 50 MCG/ACT nasal spray Place 2 sprays into both nostrils 2 (two) times daily as needed for allergies.     Marland Kitchen levothyroxine (SYNTHROID) 100 MCG tablet Take 100 mcg by mouth daily before breakfast.    . Polyethylene Glycol 3350 (MIRALAX PO) Take 17 g by mouth daily as needed (constipation).     Marland Kitchen PROAIR HFA 108 (90 Base) MCG/ACT inhaler Inhale 1-2 puffs into the lungs every 6 (six) hours as needed for wheezing.   0  . Probiotic Product (ALIGN) 4 MG CAPS Take 1 capsule by mouth daily.    . RESTASIS 0.05 % ophthalmic emulsion Place 1 drop into both eyes 2 (two) times daily. Uses as directed    . rosuvastatin (CRESTOR) 10 MG tablet Take 5 mg by mouth at bedtime.  4  . terconazole (TERAZOL 7) 0.4 % vaginal cream Place 1 applicator vaginally at bedtime. (Patient not taking: No sig reported) 45 g 0   No current facility-administered medications on file prior to visit.    Allergies  Allergen Reactions  . Amoxicillin-Pot Clavulanate Nausea Only and Swelling    Has patient had a PCN reaction causing immediate rash, facial/tongue/throat swelling, SOB or lightheadedness with hypotension: No Has patient had a PCN reaction causing severe rash involving mucus membranes or skin necrosis: No Has patient had a PCN reaction  that required hospitalization No Has patient had a PCN reaction occurring within the last 10 years: No If all of the above answers are "NO", then may proceed with Cephalosporin use.   . Azithromycin Other (See Comments)    Pancreatitis  . Erythromycin Hives    Reaction 1 time, when she was younger. Reaction 1 time, when she was younger.   Toniann Ket Other (See Comments)    Almonds: per allergy testing.  . Atorvastatin Other (See Comments)    Muscle weakness  Muscle weakness   . Codeine Nausea Only  . Ketoconazole Nausea And Vomiting  . Ketoconazole  Nausea Only and Nausea And Vomiting  . Latex Other (See Comments)    Other  . Lincomycin     Stomach upset   . Morphine Nausea Only  . Morphine Sulfate Nausea Only and Other (See Comments)      Unknown  . Other Other (See Comments)    Shellfish mix: per allergy testing.  . Shellfish Allergy Other (See Comments)    Shellfish mix: per allergy testing.  . Simvastatin Other (See Comments)    Muscle pain  . Sulfamethoxazole-Trimethoprim Nausea Only    Upset stomach   . Clindamycin Rash  . Clindamycin/Lincomycin Nausea And Vomiting and Rash  . Erythromycin Base Rash    Objective: Physical Exam  General: Kimberly [name] is a pleasant 79 y.o. Caucasian female, WD, WN in NAD. AAO x 3.   Vascular:  Neurovascular status unchanged b/l lower extremities. Capillary refill time to digits immediate b/l. Palpable pedal pulses b/l LE. Pedal hair present. Lower extremity skin temperature gradient within normal limits. No pain with calf compression b/l.  Dermatological:  Pedal skin with normal turgor, texture and tone bilaterally. No open wounds bilaterally. No interdigital macerations bilaterally. Toenails 1-5 b/l well maintained with adequate length. No erythema, no edema, no drainage, no fluctuance. Hyperkeratotic lesion(s) R hallux, R 2nd toe, submet head 2 left foot and submet head 3 left foot.  No erythema, no edema, no  drainage, no fluctuance.  Musculoskeletal:  Normal muscle strength 5/5 to all lower extremity muscle groups bilaterally. No pain crepitus or joint limitation noted with ROM b/l. Hallux valgus with bunion deformity noted b/l lower extremities. Hammertoes noted to the 2-5 bilaterally.  Neurological:  Protective sensation intact 5/5 intact bilaterally with 10g monofilament b/l. Vibratory sensation intact b/l. Proprioception intact bilaterally.  Assessment and Plan:  1. Pain due to onychomycosis of toenails of both feet   2. Corns and callosities   3. Chronic anticoagulation    -Examined patient. -No new findings. No new orders. -Corn(s) R hallux and R 2nd toe and callus(es) submet head 2 left foot and submet head 3 left foot were pared utilizing sterile scalpel blade without incident. Total number debrided =4. -Patient to report any pedal injuries to medical professional immediately. -Patient to continue soft, supportive shoe gear daily. -Patient/POA to call should there be question/concern in the interim.  Return in about 9 weeks (around 07/09/2020) for toenail debridement w/corn(s)/callus(es).  Marzetta Board, DPM

## 2020-05-11 NOTE — ED Notes (Signed)
Pt had syncopal episode while being triaged, pt moved to [facility]

## 2020-05-11 NOTE — ED Provider Notes (Signed)
TIME SEEN: 2:45 AM  CHIEF COMPLAINT: Abdominal pain  HPI: Patient is a 79 year old female who presents to the emergency department with abdominal pain for the past 3 to 4 days.  She has had nausea, vomiting and diarrhea.  Reports it is not uncommon for her to have intermittent diarrhea and constipation due to history of IBS.  No known fevers.  She is status post hysterectomy and bilateral oophorectomy.  She has never had similar symptoms.  No aggravating or alleviating factors.  Unable to describe the pain.  Patient had a syncopal event while being triaged.  She denied any previous chest pain or shortness of breath.  States she just felt very hot and nauseated.  No injury as she was sitting down when this happened.  ROS: See HPI Constitutional: no fever  Eyes: no drainage  ENT: no runny nose   Cardiovascular:  no chest pain  Resp: no SOB  GI:  vomiting and diarrhea GU: no dysuria Integumentary: no rash  Allergy: no hives  Musculoskeletal: no leg swelling  Neurological: no slurred speech ROS otherwise negative  PAST MEDICAL HISTORY/PAST SURGICAL HISTORY:  Past Medical History:  Diagnosis Date  . Allergy   . Anal polyp 1998   Flex Sig   . Anemia   . Angular blepharitis of left eye   . Ankle fracture    Stress fracture  . Anxiety   . Asthma    border line has inhaler  . Bunion   . Cataracts, bilateral   . Corneal scar    left eye  . CPAP (continuous positive airway pressure) dependence   . Degenerative disc disease   . Depression   . Diverticulosis of colon (without mention of hemorrhage) 2010   Colonoscopy  . Dry eyes    bilateral  . Enterocele   . External hemorrhoids 2000   Colonoscopy  . Family history of malignant neoplasm of gastrointestinal tract   . Female cystocele   . Fibromyalgia   . GERD (gastroesophageal reflux disease)   . Hiatal hernia 2005,2010   EGD  . History of bronchitis   . History of measles   . History of mumps   . History of strep sore  throat   . History of urinary tract infection   . Hyperlipemia   . Hypothyroidism   . IBS (irritable bowel syndrome)   . Imbalance   . Internal hemorrhoids without mention of complication 3419,6222   Colonoscopy   . Itching   . 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 Menopause   . Migraine   . OSA (obstructive sleep apnea) 05/14/2015   on BiPAP  . Osteopenia 10/2013   T score -1.6 FRAX 10%/1.6%  . PAF (paroxysmal atrial fibrillation) ([place])   . Pancreatitis   . PCO (posterior capsular opacification)    left  . Pneumonia    childhood illness  . PONV (postoperative nausea and vomiting)   . Pseudophakia, both eyes   . PVD (posterior vitreous detachment) right  . Rash    on back   . Retinal scar    left  . Rotator cuff disorder    pain, left shoulder  . Shingles   . Stricture and stenosis of esophagus 2005,2010   EGD   . Stroke ([place])   . Ulcerative colitis ([place])   . Varicose veins   . Vertigo   . Wears glasses     MEDICATIONS:  Prior to Admission medications   Medication Sig Start Date End Date Taking? Authorizing  Provider  acetaminophen (TYLENOL) 500 MG tablet Take 500 mg by mouth every 6 (six) hours as needed for moderate pain.     [provider]  diltiazem (CARDIZEM CD) 180 MG 24 hr capsule TAKE 1 CAPSULE BY MOUTH EVERY DAY Patient taking differently: Take 180 mg by mouth daily. 04/11/20   Turner, Eber Hong, MD  ELIQUIS 5 MG TABS tablet TAKE 1 TABLET BY MOUTH TWICE A DAY Patient taking differently: Take 5 mg by mouth 2 (two) times daily. 04/09/20   Sueanne Margarita, MD  EPINEPHrine 0.3 mg/0.3 mL IJ SOAJ injection Inject 0.3 mg into the muscle as needed (for allergic reaction). Reported on 05/28/2015     [provider]  famotidine (PEPCID) 20 MG tablet  03/18/20   [provider]  FAMOTIDINE PO Take by mouth as needed.    [provider]  fluticasone (FLONASE) 50 MCG/ACT nasal spray Place 2 sprays into both nostrils 2 (two) times daily as needed  for allergies.  06/06/13   [provider]  levothyroxine (SYNTHROID) 100 MCG tablet Take 100 mcg by mouth daily before breakfast.    [provider]  ondansetron (ZOFRAN-ODT) 4 MG disintegrating tablet Take 1 tablet (4 mg total) by mouth every 8 (eight) hours as needed for nausea or vomiting. 12/04/18   Davonna Belling, MD  Polyethylene Glycol 3350 (MIRALAX PO) Take 17 g by mouth daily as needed (constipation).     [provider]  PROAIR HFA 108 [phone] Base) MCG/ACT inhaler Inhale 1-2 puffs into the lungs every 6 (six) hours as needed for wheezing.  04/09/15   [provider]  Probiotic Product (ALIGN) 4 MG CAPS Take 1 capsule by mouth daily.    [provider]  RESTASIS 0.05 % ophthalmic emulsion Place 1 drop into both eyes 2 (two) times daily. Uses as directed 09/30/12   [provider]  rosuvastatin (CRESTOR) 10 MG tablet Take 5 mg by mouth daily.  03/28/15   [provider]  terconazole (TERAZOL 7) 0.4 % vaginal cream Place 1 applicator vaginally at bedtime. 03/25/20   Joseph Pierini, MD    ALLERGIES:  Allergies  Allergen Reactions  . Amoxicillin-Pot Clavulanate Nausea Only and Swelling    Has patient had a PCN reaction causing immediate rash, facial/tongue/throat swelling, SOB or lightheadedness with hypotension: No Has patient had a PCN reaction causing severe rash involving mucus membranes or skin necrosis: No Has patient had a PCN reaction that required hospitalization No Has patient had a PCN reaction occurring within the last 10 years: No If all of the above answers are "NO", then may proceed with Cephalosporin use.   . Azithromycin Other (See Comments)    Pancreatitis  . Erythromycin Hives    Reaction 1 time, when she was younger. Reaction 1 time, when she was younger.   Toniann Ket Other (See Comments)    Almonds: per allergy testing.  . Atorvastatin Other (See Comments)    Muscle weakness  Muscle weakness   .  Codeine Nausea Only  . Ketoconazole Nausea And Vomiting  . Ketoconazole Nausea Only and Nausea And Vomiting  . Latex Other (See Comments)    Other  . Lincomycin     Stomach upset   . Morphine Nausea Only  . Morphine Sulfate Nausea Only and Other (See Comments)      Unknown  . Other Other (See Comments)    Shellfish mix: per allergy testing.  . Shellfish Allergy Other (See Comments)  Shellfish mix: per allergy testing.  . Simvastatin Other (See Comments)    Muscle pain  . Sulfamethoxazole-Trimethoprim Nausea Only    Upset stomach   . Clindamycin Rash  . Clindamycin/Lincomycin Nausea And Vomiting and Rash  . Erythromycin Base Rash    SOCIAL HISTORY:  Social History   Tobacco Use  . Smoking status: Former Smoker    Packs/day: 0.25    Years: 15.00    Pack years: 3.75    Types: Cigarettes    Quit date: 06/01/1975    Years since quitting: 44.9  . Smokeless tobacco: Never Used  . Tobacco comment: Stopped smoking age 39  Substance Use Topics  . Alcohol use: No    Alcohol/week: 0.0 standard drinks    FAMILY HISTORY: Family History  Problem Relation Age of Onset  . Dementia Mother   . Irritable bowel syndrome Mother   . Alzheimer's disease Mother   . Stroke Mother   . Hypertension Father   . Congestive Heart Failure Father   . Alcohol abuse Father   . Diabetes Sister   . Allergy (severe) Sister   . Colon cancer Paternal Aunt   . Heart disease Maternal Grandmother   . Heart attack Maternal Grandmother   . Heart attack Paternal Grandfather   . Heart attack Maternal Grandfather   . Arthritis Brother   . Colon polyps Brother   . Other Sister        pituitary disease  . Allergies Sister   . Other Sister        joint problems  . Breast cancer Sister 22  . Colon polyps Sister   . Esophageal cancer Neg Hx   . Rectal cancer Neg Hx   . Stomach cancer Neg Hx     EXAM: BP 111/65   Pulse 90   Temp 98 F (36.7 C) (Oral)   Resp 15   Ht 5\' 7"  (1.702 m)   Wt 78  kg   LMP  (LMP Unknown)   SpO2 96%   BMI 26.94 kg/m  CONSTITUTIONAL: Alert and oriented and responds appropriately to questions. Well-appearing; well-nourished, elderly, no distress HEAD: Normocephalic EYES: Conjunctivae clear, pupils appear equal, EOM appear intact ENT: normal nose; moist mucous membranes NECK: Supple, normal ROM CARD: RRR; S1 and S2 appreciated; no murmurs, no clicks, no rubs, no gallops RESP: Normal chest excursion without splinting or tachypnea; breath sounds clear and equal bilaterally; no wheezes, no rhonchi, no rales, no hypoxia or respiratory distress, speaking full sentences ABD/GI: Normal bowel sounds; non-distended; soft, diffusely tender throughout the abdomen without guarding or rebound BACK:  The back appears normal EXT: Normal ROM in all joints; no deformity noted, no edema; no cyanosis SKIN: Normal color for age and race; warm; no rash on exposed skin NEURO: Moves all extremities equally PSYCH: The patient's mood and manner are appropriate.   MEDICAL DECISION MAKING: Patient here with diffuse abdominal pain.  Differential includes cholelithiasis, cholecystitis, pancreatitis, colitis, diverticulitis, appendicitis, UTI.  She also had a syncopal event while in triage it sounds like a vasovagal event.  No preceding chest pain or shortness of breath.  EKG shows no arrhythmia, interval abnormality or ischemia.  Blood glucose normal.  She does feel warm to the touch and has a rectal temperature of 100.2.  Will obtain labs, urine, CT of abdomen pelvis.  ED PROGRESS: Patient reports feeling much better after antiemetics.  Labs show no leukocytosis or leukopenia.  She has minimally elevated lipase but normal LFTs.  Urine  shows no infection.  Given upper abdominal pain with syncopal event, troponin added on which was normal.  EKG shows no significant abnormality compared to previous.  Chest x-ray obtained is clear without pneumonia, widened mediastinum, volume overload,  pneumothorax.  She has been able to tolerate p.o. here.  Suspect viral gastroenteritis causing her symptoms.  She has been vaccinated for COVID-19.  She continues to be hemodynamically stable.  I feel she is safe for discharge home.  Recommended bland diet for the next several days and will discharge with prescriptions of Bentyl, Zofran.  At this time, I do not feel there is any life-threatening condition present. I have reviewed, interpreted and discussed all results (EKG, imaging, lab, urine as appropriate) and exam findings with patient/family. I have reviewed nursing notes and appropriate previous records.  I feel the patient is safe to be discharged home without further emergent workup and can continue workup as an outpatient as needed. Discussed usual and customary return precautions. Patient/family verbalize understanding and are comfortable with this plan.  Outpatient follow-up has been provided as needed. All questions have been answered.     EKG Interpretation  Date/Time:  Sunday May 11 2020 03:00:02 EST Ventricular Rate:  93 PR Interval:    QRS Duration: 113 QT Interval:  380 QTC Calculation: 473 R Axis:   -49 Text Interpretation: Sinus rhythm Prolonged PR interval Left anterior fascicular block Abnormal R-wave progression, early transition Minimal ST depression, anterior leads Confirmed by Pryor Curia [phone]) on 05/11/2020 3:16:46 AM         Kimberly [name] was evaluated in Emergency Department on 05/11/2020 for the symptoms described in the history of present illness. She was evaluated in the context of the global COVID-19 pandemic, which necessitated consideration that the patient might be at risk for infection with the SARS-CoV-2 virus that causes COVID-19. Institutional protocols and algorithms that pertain to the evaluation of patients at risk for COVID-19 are in a state of rapid change based on information released by regulatory bodies including the CDC and  federal and state organizations. These policies and algorithms were followed during the patient's care in the ED.      Kimberly [name], Delice Bison, DO 05/11/20 [phone]

## 2020-05-11 NOTE — Telephone Encounter (Signed)
Kimberly [name]' morning dose of Eliquis was delayed until 1PM because she went to the ED last night due to GI symptom. I instructed the patient to take tonight's dose at 11PM. She can then off set each subsequent Eliquis by 1 hour until she is back to her previous scheduled of 8AM and 8PM.

## 2020-05-11 NOTE — Discharge Instructions (Signed)
Your labs, urine, EKG, chest x-ray and CT of your abdomen pelvis today were reassuring.  I suspect you have a viral illness causing symptoms.  I recommend that she rest and increase your fluid intake.  I recommend a bland diet for the next 2 to 3 days.  You may take over-the-counter Tylenol 1000 mg every 6 hours as needed for pain and fever.  We have sent a prescription of Bentyl for abdominal pain, cramps and Zofran for nausea and vomiting to your pharmacy.  If you begin having worsening pain, vomiting that will not stop, blood in your stool or black and tarry stools, chest pain or shortness of breath, please return to the emergency department.

## 2020-05-11 NOTE — ED Triage Notes (Signed)
Pt to ED via [facility] with c/o lower abd pain x's 2 days.  Pt sts she has been constipated,.  Sts she had a very small BM yesterday,  Pt sts she started having nausea and vomiting earlier today

## 2020-05-14 DIAGNOSIS — R1013 Epigastric pain: Secondary | ICD-10-CM | POA: Diagnosis not present

## 2020-05-14 DIAGNOSIS — K5901 Slow transit constipation: Secondary | ICD-10-CM | POA: Diagnosis not present

## 2020-05-15 DIAGNOSIS — G4733 Obstructive sleep apnea (adult) (pediatric): Secondary | ICD-10-CM | POA: Diagnosis not present

## 2020-05-16 ENCOUNTER — Telehealth: Payer: Self-pay | Admitting: Internal Medicine

## 2020-05-16 NOTE — Telephone Encounter (Signed)
Pt is requesting a call back from a nurse to discuss her ongoing GI issues she is experiencing, pt did not disclose any further information.

## 2020-05-16 NOTE — Telephone Encounter (Signed)
Pt states she has been having problems with constipation. Reports she almost got impacted last week. PCP told her to stop metamucil and to take !/2 dose miralax in the am and in the pm. She reports this does not seem to be helping, she has not had a BM  In 4 days. Discussed with pt that she can take 1-3 doses as needed to have a BM. Pt will try this. Pt also wanted to schedule and OV with Dr. Norman Herrlich. Pt scheduled to see Dr. Hilarie Fredrickson 06/16/20@11am . Pt aware of appt.

## 2020-06-12 ENCOUNTER — Telehealth: Payer: Self-pay | Admitting: *Deleted

## 2020-06-12 NOTE — Telephone Encounter (Signed)
Patient called and left message for at return call. I called patient back and received her voicemail asked me to call me.

## 2020-06-16 ENCOUNTER — Ambulatory Visit: Payer: Medicare PPO | Admitting: Internal Medicine

## 2020-06-24 ENCOUNTER — Ambulatory Visit: Payer: Medicare PPO | Admitting: Internal Medicine

## 2020-07-01 DIAGNOSIS — E039 Hypothyroidism, unspecified: Secondary | ICD-10-CM | POA: Diagnosis not present

## 2020-07-01 DIAGNOSIS — I700 Atherosclerosis of aorta: Secondary | ICD-10-CM | POA: Diagnosis not present

## 2020-07-16 ENCOUNTER — Ambulatory Visit: Payer: Medicare PPO | Admitting: Podiatry

## 2020-07-16 ENCOUNTER — Other Ambulatory Visit: Payer: Self-pay

## 2020-07-16 ENCOUNTER — Encounter: Payer: Self-pay | Admitting: Podiatry

## 2020-07-16 DIAGNOSIS — L84 Corns and callosities: Secondary | ICD-10-CM

## 2020-07-16 DIAGNOSIS — M79671 Pain in right foot: Secondary | ICD-10-CM

## 2020-07-16 DIAGNOSIS — Z7901 Long term (current) use of anticoagulants: Secondary | ICD-10-CM | POA: Diagnosis not present

## 2020-07-16 DIAGNOSIS — B351 Tinea unguium: Secondary | ICD-10-CM | POA: Diagnosis not present

## 2020-07-16 DIAGNOSIS — M79672 Pain in left foot: Secondary | ICD-10-CM

## 2020-07-16 DIAGNOSIS — M79674 Pain in right toe(s): Secondary | ICD-10-CM

## 2020-07-16 DIAGNOSIS — M79675 Pain in left toe(s): Secondary | ICD-10-CM

## 2020-07-18 DIAGNOSIS — I480 Paroxysmal atrial fibrillation: Secondary | ICD-10-CM | POA: Diagnosis not present

## 2020-07-18 DIAGNOSIS — M810 Age-related osteoporosis without current pathological fracture: Secondary | ICD-10-CM | POA: Diagnosis not present

## 2020-07-18 DIAGNOSIS — K200 Eosinophilic esophagitis: Secondary | ICD-10-CM | POA: Diagnosis not present

## 2020-07-18 DIAGNOSIS — E785 Hyperlipidemia, unspecified: Secondary | ICD-10-CM | POA: Diagnosis not present

## 2020-07-18 DIAGNOSIS — Z79899 Other long term (current) drug therapy: Secondary | ICD-10-CM | POA: Diagnosis not present

## 2020-07-18 DIAGNOSIS — E039 Hypothyroidism, unspecified: Secondary | ICD-10-CM | POA: Diagnosis not present

## 2020-07-18 DIAGNOSIS — G4733 Obstructive sleep apnea (adult) (pediatric): Secondary | ICD-10-CM | POA: Diagnosis not present

## 2020-07-18 DIAGNOSIS — I700 Atherosclerosis of aorta: Secondary | ICD-10-CM | POA: Diagnosis not present

## 2020-07-18 DIAGNOSIS — Z0000 Encounter for general adult medical examination without abnormal findings: Secondary | ICD-10-CM | POA: Diagnosis not present

## 2020-07-20 NOTE — Progress Notes (Signed)
Subjective: Kimberly [name] is a pleasant 80 y.o. female patient seen today painful corn(s) b/l  which interfere(s) with ambulation. Aggravating factors include wearing enclosed shoe gear. Pain is relieved with periodic professional debridement.   She states her right hallux is sore on today's visit.  Allergies  Allergen Reactions  . Amoxicillin-Pot Clavulanate Nausea Only and Swelling    Has patient had a PCN reaction causing immediate rash, facial/tongue/throat swelling, SOB or lightheadedness with hypotension: No Has patient had a PCN reaction causing severe rash involving mucus membranes or skin necrosis: No Has patient had a PCN reaction that required hospitalization No Has patient had a PCN reaction occurring within the last 10 years: No If all of the above answers are "NO", then may proceed with Cephalosporin use.  Other reaction(s): nausea  . Azithromycin Other (See Comments)    Pancreatitis Other reaction(s): Other, pancreatitis  . Erythromycin Hives    Reaction 1 time, when she was younger. Reaction 1 time, when she was younger.  Other reaction(s): hives  . Almond Oil Other (See Comments)    Almonds: per allergy testing.  . Amoxicillin Nausea Only  . Atorvastatin Other (See Comments)    Muscle weakness  Muscle weakness  Other reaction(s): muscle weakness, Other  . Codeine Nausea Only    Other reaction(s): Nausea/Vomiting  . Ketoconazole Nausea And Vomiting  . Ketoconazole Nausea Only and Nausea And Vomiting    Other reaction(s): Unknown  . Latex Other (See Comments)    Other  . Lincomycin     Stomach upset   . Morphine Nausea Only    Other reaction(s): Unknown  . Morphine Sulfate Nausea Only and Other (See Comments)      Unknown  . Morphine Sulfate     Other reaction(s): Unknown  . Other Other (See Comments)    Shellfish mix: per allergy testing. Other reaction(s): nausea  . Shellfish Allergy Other (See Comments)    Shellfish mix: per allergy  testing.  . Simvastatin Other (See Comments)    Muscle pain Other reaction(s): muscle pain, Other  . Sulfamethoxazole-Trimethoprim Nausea Only    Upset stomach   . Clindamycin Rash  . Clindamycin/Lincomycin Nausea And Vomiting and Rash  . Erythromycin Base Rash    Objective: Physical Exam  General: Kimberly [name] is a pleasant 80 y.o. Caucasian female, WD, WN in NAD. AAO x 3.   Vascular:  Neurovascular status unchanged b/l lower extremities. Capillary refill time to digits immediate b/l. Palpable pedal pulses b/l LE. Pedal hair present. Lower extremity skin temperature gradient within normal limits. No pain with calf compression b/l.  Dermatological:  Pedal skin with normal turgor, texture and tone bilaterally. No open wounds bilaterally. No interdigital macerations bilaterally. Toenails 1-5 b/l well maintained with adequate length. No erythema, no edema, no drainage, no fluctuance. Hyperkeratotic lesion(s) R hallux, R 2nd toe, submet head 2 left foot and submet head 3 left foot.  No erythema, no edema, no drainage, no fluctuance.  Musculoskeletal:  Normal muscle strength 5/5 to all lower extremity muscle groups bilaterally. No pain crepitus or joint limitation noted with ROM b/l. Hallux valgus with bunion deformity noted b/l lower extremities. Hammertoes noted to the 2-5 bilaterally.  Neurological:  Protective sensation intact 5/5 intact bilaterally with 10g monofilament b/l. Vibratory sensation intact b/l. Proprioception intact bilaterally.  Assessment and Plan:  1. Pain due to onychomycosis of toenails of both feet   2. Corns and callosities   3. Chronic anticoagulation   4. Pain in both  feet    -Examined patient. -No new findings. No new orders. -Corn(s) R hallux and R 2nd toe and callus(es) submet head 2 left foot and submet head 3 left foot were pared utilizing sterile scalpel blade without incident. Total number debrided =4. -Patient to report any pedal injuries to  medical professional immediately. -Patient to continue soft, supportive shoe gear daily. -Patient/POA to call should there be question/concern in the interim.  Return in about 3 months (around 10/13/2020).  Marzetta Board, DPM

## 2020-08-02 ENCOUNTER — Other Ambulatory Visit (HOSPITAL_COMMUNITY): Payer: Self-pay | Admitting: Cardiology

## 2020-08-04 NOTE — Telephone Encounter (Signed)
Eliquis 5mg  refill request received. Patient is 80 years old, weight-78kg, Crea-0.75 on 05/11/2020, Diagnosis-Afib, and last seen by Dr. Radford Pax on 03/18/2020. Dose is appropriate based on dosing criteria. Will send in refill to requested pharmacy.

## 2020-08-11 ENCOUNTER — Ambulatory Visit: Payer: Medicare PPO | Admitting: Podiatry

## 2020-08-12 DIAGNOSIS — Z85828 Personal history of other malignant neoplasm of skin: Secondary | ICD-10-CM | POA: Diagnosis not present

## 2020-08-12 DIAGNOSIS — L820 Inflamed seborrheic keratosis: Secondary | ICD-10-CM | POA: Diagnosis not present

## 2020-08-13 DIAGNOSIS — G4733 Obstructive sleep apnea (adult) (pediatric): Secondary | ICD-10-CM | POA: Diagnosis not present

## 2020-08-29 ENCOUNTER — Ambulatory Visit: Payer: Medicare PPO | Admitting: Internal Medicine

## 2020-09-05 ENCOUNTER — Other Ambulatory Visit: Payer: Self-pay

## 2020-09-05 ENCOUNTER — Encounter: Payer: Self-pay | Admitting: Cardiology

## 2020-09-05 ENCOUNTER — Telehealth: Payer: Self-pay | Admitting: *Deleted

## 2020-09-05 ENCOUNTER — Ambulatory Visit: Payer: Medicare PPO | Admitting: Cardiology

## 2020-09-05 VITALS — BP 130/78 | HR 61 | Ht 67.0 in | Wt 174.6 lb

## 2020-09-05 DIAGNOSIS — I480 Paroxysmal atrial fibrillation: Secondary | ICD-10-CM | POA: Diagnosis not present

## 2020-09-05 DIAGNOSIS — G4733 Obstructive sleep apnea (adult) (pediatric): Secondary | ICD-10-CM

## 2020-09-05 NOTE — Patient Instructions (Signed)
Medication Instructions:  Your physician recommends that you continue on your current medications as directed. Please refer to the Current Medication list given to you today.  *If you need a refill on your cardiac medications before your next appointment, please call your pharmacy*   Lab Work: none If you have labs (blood work) drawn today and your tests are completely normal, you will receive your results only by: Marland Kitchen MyChart Message (if you have MyChart) OR . A paper copy in the mail If you have any lab test that is abnormal or we need to change your treatment, we will call you to review the results.   Testing/Procedures: none   Follow-Up: At [facility], you and your health needs are our priority.  As part of our continuing mission to provide you with exceptional heart care, we have created designated Provider Care Teams.  These Care Teams include your primary Cardiologist (physician) and Advanced Practice Providers (APPs -  Physician Assistants and Nurse Practitioners) who all work together to provide you with the care you need, when you need it.  We recommend signing up for the patient portal called "MyChart".  Sign up information is provided on this After Visit Summary.  MyChart is used to connect with patients for Virtual Visits (Telemedicine).  Patients are able to view lab/test results, encounter notes, upcoming appointments, etc.  Non-urgent messages can be sent to your provider as well.   To learn more about what you can do with MyChart, go to [email or web address].    Your next appointment:   6 month(s)  The format for your next appointment:   In Person  Provider:   You may see Fransico Him, MD or one of the following Advanced Practice Providers on your designated Care Team:    Melina Copa, PA-C  Ermalinda Barrios, PA-C    Other Instructions

## 2020-09-05 NOTE — Progress Notes (Signed)
Cardiology Office Note:    Date:  09/05/2020   ID:  [name], DOB [birth year], MRN [id number]  PCP:  Lajean Manes, MD  Cardiologist:  Fransico Him, MD    Referring MD: Lajean Manes, MD   Chief Complaint  Patient presents with  . Sleep Apnea  . Atrial Fibrillation    History of Present Illness:    Kimberly [name] is a 80 y.o. female with a hx of PAF on metoprolol and Eliquis for chads vas score of 5. Metoprolol decreased by [facility] because of bradycardia.She also has a historyof chest pain with normal nuclear stress testandsyncope felt secondary to dehydration. She has a chronic RBBB with no ischemia on stress test a year ago. Due to chest pain a coronary CTA was done and showed  a calcium score of 0 and no evidence of CAD.    She presented to the ER on 8/30 with recurrent CP that occurred as a band around under her breasts that was 5/10 without radiation and associated with pressure.  EMS was called and noted her HR to be in the 200's in afib.  She received NTG with improvement in pain.  Pain lasted 30 minutes but was not associated with any other sx. Initial trop in ER was normal at 11 but trended up to 135.  2D echo showed normal LVF with no PE.  It was felt that she may have GERD and had gone off PPI.  She was placed back on her PPI.    She has moderate OSA with an AHI of 16/hr on HST.  She is doing well with her PAP device and thinks that she has gotten used to it.  She tolerates the mask and feels the pressure is adequate.  Since going on PAP she feels rested in the am and has no significant daytime sleepiness.  She does have some nasal dryness or nasal congestion.  She is having severe dry mouth when she wakes up and has the humidity set at 8.   She denies any chest pain or pressure, SOB, DOE, PND, orthopnea, dizziness, palpitations or syncope.  Occasionally she will have some LE edema that is intermittent.  She is compliant with her meds and is tolerating  meds with no SE.    Past Medical History:  Diagnosis Date  . Allergy   . Anal polyp 1998   Flex Sig   . Anemia   . Angular blepharitis of left eye   . Ankle fracture    Stress fracture  . Anxiety   . Asthma    border line has inhaler  . Bunion   . Cataracts, bilateral   . Corneal scar    left eye  . CPAP (continuous positive airway pressure) dependence   . Degenerative disc disease   . Depression   . Diverticulosis of colon (without mention of hemorrhage) 2010   Colonoscopy  . Dry eyes    bilateral  . Enterocele   . External hemorrhoids 2000   Colonoscopy  . Family history of malignant neoplasm of gastrointestinal tract   . Female cystocele   . Fibromyalgia   . GERD (gastroesophageal reflux disease)   . Hiatal hernia 2005,2010   EGD  . History of bronchitis   . History of measles   . History of mumps   . History of strep sore throat   . History of urinary tract infection   . Hyperlipemia   . Hypothyroidism   . IBS (irritable  bowel syndrome)   . Imbalance   . Internal hemorrhoids without mention of complication 0233,4356   Colonoscopy   . Itching   . 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 Menopause   . Migraine   . OSA (obstructive sleep apnea) 05/14/2015   on BiPAP  . Osteopenia 10/2013   T score -1.6 FRAX 10%/1.6%  . PAF (paroxysmal atrial fibrillation) ([place])   . Pancreatitis   . PCO (posterior capsular opacification)    left  . Pneumonia    childhood illness  . PONV (postoperative nausea and vomiting)   . Pseudophakia, both eyes   . PVD (posterior vitreous detachment) right  . Rash    on back   . Retinal scar    left  . Rotator cuff disorder    pain, left shoulder  . Shingles   . Stricture and stenosis of esophagus 2005,2010   EGD   . Stroke ([place])   . Ulcerative colitis ([place])   . Varicose veins   . Vertigo   . Wears glasses     Past Surgical History:  Procedure Laterality Date  . ABDOMINAL HYSTERECTOMY    . ANAL FISSURE REPAIR    . BREAST  EXCISIONAL BIOPSY Left   . BUNIONECTOMY  2014  . CATARACT EXTRACTION Bilateral 2013  . COLONOSCOPY     polyp removed  . ESOPHAGEAL DILATION    . EYE SURGERY Left   . FOOT SURGERY     left   . HEMORRHOID SURGERY    . MOUTH SURGERY  11/13/11   Cyst removed from gum-benign   . NASAL SEPTUM SURGERY    . NASAL SEPTUM SURGERY  1970's  . REPLACEMENT TOTAL KNEE Right 2011  . TONSILLECTOMY    . TOTAL ABDOMINAL HYSTERECTOMY W/ BILATERAL SALPINGOOPHORECTOMY  1991   TAH BSO  . TOTAL HIP ARTHROPLASTY     right   . TOTAL KNEE ARTHROPLASTY     both  . TOTAL KNEE ARTHROPLASTY Left 06/23/2015   Procedure: LEFT TOTAL KNEE ARTHROPLASTY;  Surgeon: Gaynelle Arabian, MD;  Location: [facility];  Service: Orthopedics;  Laterality: Left;    Current Medications: Current Meds  Medication Sig  . acetaminophen (TYLENOL) 500 MG tablet Take 500 mg by mouth every 6 (six) hours as needed for moderate pain.   . bisacodyl (DULCOLAX) 5 MG EC tablet Take 5 mg by mouth daily as needed for moderate constipation.  Marland Kitchen CALCIUM PO Take by mouth.  . diltiazem (CARDIZEM CD) 180 MG 24 hr capsule TAKE 1 CAPSULE BY MOUTH EVERY DAY  . ELIQUIS 5 MG TABS tablet TAKE 1 TABLET BY MOUTH TWICE A DAY  . EPINEPHrine 0.3 mg/0.3 mL IJ SOAJ injection Inject 0.3 mg into the muscle as needed (for allergic reaction). Reported on 05/28/2015  . fluticasone (FLONASE) 50 MCG/ACT nasal spray Place 2 sprays into both nostrils 2 (two) times daily as needed for allergies.   Marland Kitchen levothyroxine (SYNTHROID) 100 MCG tablet Take 100 mcg by mouth daily before breakfast.  . ondansetron (ZOFRAN ODT) 4 MG disintegrating tablet Take 1 tablet (4 mg total) by mouth every 6 (six) hours as needed.  . Polyethylene Glycol 3350 (MIRALAX PO) Take 17 g by mouth daily as needed (constipation).   Marland Kitchen PROAIR HFA 108 (90 Base) MCG/ACT inhaler Inhale 1-2 puffs into the lungs every 6 (six) hours as needed for wheezing.   . Probiotic Product (ALIGN) 4 MG CAPS Take 1 capsule by mouth  daily.  Marland Kitchen Propylene Glycol (SYSTANE BALANCE OP) Place  1 drop into both eyes daily as needed (dry eyes).  . psyllium (METAMUCIL) 58.6 % powder Take 1 packet by mouth daily as needed (constipation).  . RESTASIS 0.05 % ophthalmic emulsion Place 1 drop into both eyes 2 (two) times daily. Uses as directed  . rosuvastatin (CRESTOR) 10 MG tablet Take 5 mg by mouth at bedtime.  Marland Kitchen VITAMIN D PO Take by mouth.     Allergies:   Amoxicillin-pot clavulanate, Azithromycin, Erythromycin, Almond oil, Amoxicillin, Atorvastatin, Codeine, Ketoconazole, Ketoconazole, Latex, Lincomycin, Morphine, Morphine sulfate, Morphine sulfate, Other, Shellfish allergy, Simvastatin, Sulfamethoxazole-trimethoprim, Clindamycin, Clindamycin/lincomycin, and Erythromycin base   Social History   Socioeconomic History  . Marital status: Married    Spouse name: Not on file  . Number of children: 1  . Years of education: bus.school  . Highest education level: Not on file  Occupational History  . Occupation: Retired    Fish farm manager: RETIRED  Tobacco Use  . Smoking status: Former Smoker    Packs/day: 0.25    Years: 15.00    Pack years: 3.75    Types: Cigarettes    Quit date: 06/01/1975    Years since quitting: 45.2  . Smokeless tobacco: Never Used  . Tobacco comment: Stopped smoking age 30  Vaping Use  . Vaping Use: Never used  Substance and Sexual Activity  . Alcohol use: No    Alcohol/week: 0.0 standard drinks  . Drug use: No  . Sexual activity: Never    Birth control/protection: Surgical    Comment: HYST, intercourse age unknown, sexual partner less than 5  Other Topics Concern  . Not on file  Social History Narrative   Daily caffeine    Social Determinants of Health   Financial Resource Strain: Not on file  Food Insecurity: Not on file  Transportation Needs: Not on file  Physical Activity: Not on file  Stress: Not on file  Social Connections: Not on file     Family History: The patient's family history  includes Alcohol abuse in her father; Allergies in her sister; Allergy (severe) in her sister; Alzheimer's disease in her mother; Arthritis in her brother; Breast cancer (age of onset: 34) in her sister; Colon cancer in her paternal aunt; Colon polyps in her brother and sister; Congestive Heart Failure in her father; Dementia in her mother; Diabetes in her sister; Heart attack in her maternal grandfather, maternal grandmother, and paternal grandfather; Heart disease in her maternal grandmother; Hypertension in her father; Irritable bowel syndrome in her mother; Other in her sister and sister; Stroke in her mother. There is no history of Esophageal cancer, Rectal cancer, or Stomach cancer.  ROS:   Please see the history of present illness.    ROS  All other systems reviewed and negative.   EKGs/Labs/Other Studies Reviewed:    The following studies were reviewed today: none  EKG:  EKG is not ordered today.   Recent Labs: 05/11/2020: ALT 15; BUN 16; Creatinine, Ser 0.75; Hemoglobin 13.0; Platelets 186; Potassium 3.6; Sodium 134   Recent Lipid Panel    Component Value Date/Time   CHOL 124 07/23/2013 0632   TRIG 69 07/23/2013 0632   HDL 68 07/23/2013 0632   CHOLHDL 1.8 07/23/2013 0632   VLDL 14 07/23/2013 0632   LDLCALC 42 07/23/2013 0632    Physical Exam:    VS:  BP 130/78   Pulse 61   Ht 5\' 7"  (6.568 m)   Wt 174 lb 9.6 oz (79.2 kg)   LMP  (LMP Unknown)  SpO2 95%   BMI 27.35 kg/m     Wt Readings from Last 3 Encounters:  09/05/20 174 lb 9.6 oz (79.2 kg)  05/11/20 172 lb (78 kg)  03/18/20 175 lb (79.4 kg)     GEN: Well nourished, well developed in no acute distress HEENT: Normal NECK: No JVD; No carotid bruits LYMPHATICS: No lymphadenopathy CARDIAC:RRR, no murmurs, rubs, gallops RESPIRATORY:  Clear to auscultation without rales, wheezing or rhonchi  ABDOMEN: Soft, non-tender, non-distended MUSCULOSKELETAL:  No edema; No deformity  SKIN: Warm and dry NEUROLOGIC:  Alert  and oriented x 3 PSYCHIATRIC:  Normal affect    ASSESSMENT:    1. PAF (paroxysmal atrial fibrillation) ([place])   2. OSA (obstructive sleep apnea)    PLAN:    In order of problems listed above:  1.  PAF  -She is maintaining NSR on exam but notices occasionally fluttering in her neck -no bleeding on DOAC -continue on Cardizem CD 180mg  daily -continue Eliquis 5mg  BID. -SCr was 0.79 in Feb 2022 and Hbg 13 in Dec 2021  2.  OSA - The patient is tolerating PAP therapy well without any problems. The PAP download was reviewed today and showed an AHI of 9/hr on auto BiPAP  with 87% compliance in using more than 4 hours nightly.  The patient has been using and benefiting from PAP use and will continue to benefit from therapy.  -she has a mask leak which is likely increasing her AHI>>she changes the cushion out frequently -encouraged her to try to tighten up her mask and will refer her back to DME to see if we can try a different mask -she does not sleep on her back -encouraged her to increase the humidity in her device to help with dry mouth   Medication Adjustments/Labs and Tests Ordered: Current medicines are reviewed at length with the patient today.  Concerns regarding medicines are outlined above.  No orders of the defined types were placed in this encounter.  No orders of the defined types were placed in this encounter.   Signed, Fransico Him, MD  09/05/2020 11:18 AM    [place]

## 2020-09-05 NOTE — Telephone Encounter (Signed)
Reached out to [facility] spoke to rhonda (RT) and she will call patient and get her set up for a mask fitting.

## 2020-09-05 NOTE — Telephone Encounter (Signed)
-----   Message from Sueanne Margarita, MD sent at 09/05/2020 11:27 AM EDT ----- Please set up appt in person with DME - her mask is leaking too much

## 2020-09-08 DIAGNOSIS — Z03818 Encounter for observation for suspected exposure to other biological agents ruled out: Secondary | ICD-10-CM | POA: Diagnosis not present

## 2020-09-08 DIAGNOSIS — Z20822 Contact with and (suspected) exposure to covid-19: Secondary | ICD-10-CM | POA: Diagnosis not present

## 2020-09-16 ENCOUNTER — Telehealth: Payer: Self-pay | Admitting: Allergy and Immunology

## 2020-09-16 ENCOUNTER — Other Ambulatory Visit: Payer: Self-pay

## 2020-09-16 ENCOUNTER — Encounter: Payer: Self-pay | Admitting: Allergy and Immunology

## 2020-09-16 ENCOUNTER — Ambulatory Visit: Payer: Medicare PPO | Admitting: Allergy and Immunology

## 2020-09-16 VITALS — BP 132/86 | HR 59 | Temp 97.3°F | Resp 16 | Ht 64.0 in | Wt 177.4 lb

## 2020-09-16 DIAGNOSIS — J3089 Other allergic rhinitis: Secondary | ICD-10-CM | POA: Diagnosis not present

## 2020-09-16 DIAGNOSIS — T781XXA Other adverse food reactions, not elsewhere classified, initial encounter: Secondary | ICD-10-CM | POA: Diagnosis not present

## 2020-09-16 DIAGNOSIS — H04123 Dry eye syndrome of bilateral lacrimal glands: Secondary | ICD-10-CM

## 2020-09-16 DIAGNOSIS — Z8709 Personal history of other diseases of the respiratory system: Secondary | ICD-10-CM | POA: Diagnosis not present

## 2020-09-16 DIAGNOSIS — J301 Allergic rhinitis due to pollen: Secondary | ICD-10-CM

## 2020-09-16 DIAGNOSIS — T7819XA Other adverse food reactions, not elsewhere classified, initial encounter: Secondary | ICD-10-CM

## 2020-09-16 MED ORDER — MONTELUKAST SODIUM 10 MG PO TABS: 10.0000 mg | ORAL_TABLET | Freq: Every day | ORAL | 5 refills | Status: DC

## 2020-09-16 MED ORDER — PROAIR HFA 108 (90 BASE) MCG/ACT IN AERS: 1.0000 | INHALATION_SPRAY | Freq: Four times a day (QID) | RESPIRATORY_TRACT | 3 refills | Status: DC | PRN

## 2020-09-16 MED ORDER — SYSTANE 0.4-0.3 % OP SOLN: 2.0000 [drp] | Freq: Every day | OPHTHALMIC | 5 refills | Status: DC | PRN

## 2020-09-16 MED ORDER — EPINEPHRINE 0.3 MG/0.3ML IJ SOAJ: 0.3000 mg | INTRAMUSCULAR | 2 refills | Status: DC | PRN

## 2020-09-16 MED ORDER — OLOPATADINE HCL 0.6 % NA SOLN: 2.0000 | Freq: Two times a day (BID) | NASAL | 5 refills | Status: DC | PRN

## 2020-09-16 NOTE — Progress Notes (Signed)
[place] [facility] [place] - [place] - [place] - [place]   Dear Dr. Felipa Eth,  Thank you for referring Kimberly [name] to the [facility] on 09/16/2020.   Below is a summation of this patient's evaluation and recommendations.  Thank you for your referral. I will keep you informed about this patient's response to treatment.   If you have any questions please do not hesitate to contact me.   Sincerely,  Jiles Prows, MD Allergy / Immunology [place] [facility]   ______________________________________________________________________    NEW PATIENT NOTE  Referring Provider: Lajean Manes, MD Primary Provider: Lajean Manes, MD Date of office visit: 09/16/2020    Subjective:   Chief Complaint:  Kimberly [name] (DOB: November 18, 1940) is a 80 y.o. female who presents to the clinic on 09/16/2020 with a chief complaint of Allergic Rhinitis  (Has been off all anti-histamines she was taken off of allergy injections for an irregular heartbeat also has concerns with allergy testing ) .     HPI: [name] presents to this clinic in evaluation of allergic disease.  I had seen her in this clinic for a similar issue over 7 years ago.  Apparently she was using immunotherapy at a local allergist office until about 4 years ago at which point in time she discontinued this form of therapy because of her atrial fibrillation and medication interactions.  She was utilizing this form of therapy because of continued problems especially during the spring with eye issues and nasal issues.  As well, there appeared to be an issue with her developing "chills" during the spring in conjunction with these airway and eye issues.  This spring she has had lots of nasal congestion and stuffiness and sneezing especially if she is locating to the outdoors.  And her dry eye syndrome, presently treated with Restasis and Systane, has  been a little bit worse.  It hould be noted that she is now taking Claritin for this issue.  In conjunction with her airway issues she has once again developed a 5-day period of chills the past week or so but fortunately that issue has resolved.  She did have a COVID test which was negative.  Her symptoms continue even though she uses nasal fluticasone on a pretty regular basis.  She carries a diagnosis of very distant asthma.  She has not had any wheezing or coughing or shortness of breath or chest tightness in years.  She has not used a rescue inhaler in years.  She carries the diagnosis of food allergy based upon either skin testing or blood tests showing an allergy to shrimp.  Apparently she had eaten shrimp her entire life and has never had a reaction when consuming shrimp.  She can eat oysters and crabs and lobsters with no problem.  She was given an EpiPen.  She apparently does develop an issue with mouth tingling if she eats walnuts and [place]cad[place] grapes.  She can eat almonds and pecans and pistachios and cashews and peanuts and other fruits and vegetables with no problem.  She has a BiPAP machine with full facemask that she utilizes about 4 to 5 hours per night.  Sometimes the mask does not fit correctly and ends up swelling of her eyes and drying out her eyes.  She has received 4 Pfizer COVID vaccines and a flu vaccine.  She has been under a significant amount of stress given her husband's health issues  which includes prostate cancer and thyroid cancer which has required a fair amount of medical intervention recently.  Past Medical History:  Diagnosis Date  . Allergy   . Anal polyp 1998   Flex Sig   . Anemia   . Angular blepharitis of left eye   . Ankle fracture    Stress fracture  . Anxiety   . Asthma    border line has inhaler  . Bunion   . Cataracts, bilateral   . Corneal scar    left eye  . CPAP (continuous positive airway pressure) dependence   . Degenerative disc disease    . Depression   . Diverticulosis of colon (without mention of hemorrhage) 2010   Colonoscopy  . Dry eyes    bilateral  . Enterocele   . External hemorrhoids 2000   Colonoscopy  . Family history of malignant neoplasm of gastrointestinal tract   . Female cystocele   . Fibromyalgia   . GERD (gastroesophageal reflux disease)   . Hiatal hernia 2005,2010   EGD  . History of bronchitis   . History of measles   . History of mumps   . History of strep sore throat   . History of urinary tract infection   . Hyperlipemia   . Hypothyroidism   . IBS (irritable bowel syndrome)   . Imbalance   . Internal hemorrhoids without mention of complication 2703,5009   Colonoscopy   . Itching   . 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 Menopause   . Migraine   . OSA (obstructive sleep apnea) 05/14/2015   on BiPAP  . Osteopenia 10/2013   T score -1.6 FRAX 10%/1.6%  . PAF (paroxysmal atrial fibrillation) ([place])   . Pancreatitis   . PCO (posterior capsular opacification)    left  . Pneumonia    childhood illness  . PONV (postoperative nausea and vomiting)   . Pseudophakia, both eyes   . PVD (posterior vitreous detachment) right  . Rash    on back   . Retinal scar    left  . Rotator cuff disorder    pain, left shoulder  . Shingles   . Stricture and stenosis of esophagus 2005,2010   EGD   . Stroke ([place])   . Ulcerative colitis ([place])   . Varicose veins   . Vertigo   . Wears glasses     Past Surgical History:  Procedure Laterality Date  . ABDOMINAL HYSTERECTOMY    . ANAL FISSURE REPAIR    . BREAST EXCISIONAL BIOPSY Left   . BUNIONECTOMY  2014  . CATARACT EXTRACTION Bilateral 2013  . COLONOSCOPY     polyp removed  . ESOPHAGEAL DILATION    . EYE SURGERY Left   . FOOT SURGERY     left   . HEMORRHOID SURGERY    . MOUTH SURGERY  11/13/11   Cyst removed from gum-benign   . NASAL SEPTUM SURGERY    . NASAL SEPTUM SURGERY  1970's  . REPLACEMENT TOTAL KNEE Right 2011  . TONSILLECTOMY    . TOTAL  ABDOMINAL HYSTERECTOMY W/ BILATERAL SALPINGOOPHORECTOMY  1991   TAH BSO  . TOTAL HIP ARTHROPLASTY     right   . TOTAL KNEE ARTHROPLASTY     both  . TOTAL KNEE ARTHROPLASTY Left 06/23/2015   Procedure: LEFT TOTAL KNEE ARTHROPLASTY;  Surgeon: Gaynelle Arabian, MD;  Location: [facility];  Service: Orthopedics;  Laterality: Left;    Allergies as of 09/16/2020      Reactions  Amoxicillin-pot Clavulanate Nausea Only, Swelling   Has patient had a PCN reaction causing immediate rash, facial/tongue/throat swelling, SOB or lightheadedness with hypotension: No Has patient had a PCN reaction causing severe rash involving mucus membranes or skin necrosis: No Has patient had a PCN reaction that required hospitalization No Has patient had a PCN reaction occurring within the last 10 years: No If all of the above answers are "NO", then may proceed with Cephalosporin use. Other reaction(s): nausea   Azithromycin Other (See Comments)   Pancreatitis Other reaction(s): Other, pancreatitis   Erythromycin Hives   Reaction 1 time, when she was younger. Reaction 1 time, when she was younger. Other reaction(s): hives   Almond Oil Other (See Comments)   Almonds: per allergy testing.   Amoxicillin Nausea Only   Atorvastatin Other (See Comments)   Muscle weakness Muscle weakness Other reaction(s): muscle weakness, Other   Codeine Nausea Only   Other reaction(s): Nausea/Vomiting   Ketoconazole Nausea And Vomiting   Ketoconazole Nausea Only, Nausea And Vomiting   Other reaction(s): Unknown   Latex Other (See Comments)   Other   Lincomycin    Stomach upset    Morphine Nausea Only   Other reaction(s): Unknown   Morphine Sulfate Nausea Only, Other (See Comments)   Unknown   Morphine Sulfate    Other reaction(s): Unknown   Other Other (See Comments)   Shellfish mix: per allergy testing. Other reaction(s): nausea   Shellfish Allergy Other (See Comments)   Shellfish mix: per allergy testing.   Simvastatin  Other (See Comments)   Muscle pain Other reaction(s): muscle pain, Other   Sulfamethoxazole-trimethoprim Nausea Only   Upset stomach   Clindamycin Rash   Clindamycin/lincomycin Nausea And Vomiting, Rash   Erythromycin Base Rash      Medication List      acetaminophen 500 MG tablet Commonly known as: TYLENOL Take 500 mg by mouth every 6 (six) hours as needed for moderate pain.   Align 4 MG Caps Take 1 capsule by mouth daily.   bisacodyl 5 MG EC tablet Commonly known as: DULCOLAX Take 5 mg by mouth daily as needed for moderate constipation.   CALCIUM PO Take by mouth.   diltiazem 180 MG 24 hr capsule Commonly known as: CARDIZEM CD TAKE 1 CAPSULE BY MOUTH EVERY DAY   Eliquis 5 MG Tabs tablet Generic drug: apixaban TAKE 1 TABLET BY MOUTH TWICE A DAY   EPINEPHrine 0.3 mg/0.3 mL Soaj injection Commonly known as: EPI-PEN Inject 0.3 mg into the muscle as needed (for allergic reaction). Reported on 05/28/2015   fluticasone 50 MCG/ACT nasal spray Commonly known as: FLONASE Place 2 sprays into both nostrils 2 (two) times daily as needed for allergies.   levothyroxine 100 MCG tablet Commonly known as: SYNTHROID Take 100 mcg by mouth daily before breakfast.   MIRALAX PO Take 17 g by mouth daily as needed (constipation).   ondansetron 4 MG disintegrating tablet Commonly known as: Zofran ODT Take 1 tablet (4 mg total) by mouth every 6 (six) hours as needed.   ProAir HFA 108 (90 Base) MCG/ACT inhaler Generic drug: albuterol Inhale 1-2 puffs into the lungs every 6 (six) hours as needed for wheezing.   psyllium 58.6 % powder Commonly known as: METAMUCIL Take 1 packet by mouth daily as needed (constipation).   Restasis 0.05 % ophthalmic emulsion Generic drug: cycloSPORINE Place 1 drop into both eyes 2 (two) times daily. Uses as directed   rosuvastatin 10 MG tablet Commonly known as: CRESTOR  Take 5 mg by mouth at bedtime.   SYSTANE BALANCE OP Place 1 drop into both  eyes daily as needed (dry eyes).   VITAMIN D PO Take by mouth.       Review of systems negative except as noted in HPI / PMHx or noted below:  Review of Systems  Constitutional: Negative.   HENT: Negative.   Eyes: Negative.   Respiratory: Negative.   Cardiovascular: Negative.   Gastrointestinal: Negative.   Genitourinary: Negative.   Musculoskeletal: Negative.   Skin: Negative.   Neurological: Negative.   Endo/Heme/Allergies: Negative.   Psychiatric/Behavioral: Negative.     Family History  Problem Relation Age of Onset  . Dementia Mother   . Irritable bowel syndrome Mother   . Alzheimer's disease Mother   . Stroke Mother   . Hypertension Father   . Congestive Heart Failure Father   . Alcohol abuse Father   . Diabetes Sister   . Allergy (severe) Sister   . Colon cancer Paternal Aunt   . Heart disease Maternal Grandmother   . Heart attack Maternal Grandmother   . Heart attack Paternal Grandfather   . Heart attack Maternal Grandfather   . Arthritis Brother   . Colon polyps Brother   . Other Sister        pituitary disease  . Allergies Sister   . Other Sister        joint problems  . Breast cancer Sister 76  . Colon polyps Sister   . Esophageal cancer Neg Hx   . Rectal cancer Neg Hx   . Stomach cancer Neg Hx     Social History   Socioeconomic History  . Marital status: Married    Spouse name: Not on file  . Number of children: 1  . Years of education: bus.school  . Highest education level: Not on file  Occupational History  . Occupation: Retired    Fish farm manager: RETIRED  Tobacco Use  . Smoking status: Former Smoker    Packs/day: 0.25    Years: 15.00    Pack years: 3.75    Types: Cigarettes    Quit date: 06/01/1975    Years since quitting: 45.3  . Smokeless tobacco: Never Used  . Tobacco comment: Stopped smoking age 69  Vaping Use  . Vaping Use: Never used  Substance and Sexual Activity  . Alcohol use: No    Alcohol/week: 0.0 standard drinks  .  Drug use: No  . Sexual activity: Never    Birth control/protection: Surgical    Comment: HYST, intercourse age unknown, sexual partner less than 5  Other Topics Concern  . Not on file  Social History Narrative   Daily caffeine    Environmental and Social history  Lives in a house with a dry environment, no animals located inside the household, no carpet in the bedroom, plastic on the mattress, no plastic on the pillow, no smoking ongoing with inside the household.  Objective:   Vitals:   09/16/20 0944  BP: 132/86  Pulse: (!) 59  Resp: 16  Temp: (!) 97.3 F (36.3 C)  SpO2: 98%   Height: 5\' 4"  (162.6 cm) Weight: 177 lb 6.4 oz (80.5 kg)  Physical Exam Constitutional:      Appearance: She is not diaphoretic.  HENT:     Head: Normocephalic.     Right Ear: Tympanic membrane, ear canal and external ear normal.     Left Ear: Tympanic membrane, ear canal and external ear normal.  Nose: Nose normal. No mucosal edema or rhinorrhea.     Mouth/Throat:     Pharynx: Uvula midline. No oropharyngeal exudate.  Eyes:     Conjunctiva/sclera: Conjunctivae normal.  Neck:     Thyroid: No thyromegaly.     Trachea: Trachea normal. No tracheal tenderness or tracheal deviation.  Cardiovascular:     Rate and Rhythm: Normal rate and regular rhythm.     Heart sounds: Normal heart sounds, S1 normal and S2 normal. No murmur heard.   Pulmonary:     Effort: No respiratory distress.     Breath sounds: Normal breath sounds. No stridor. No wheezing or rales.  Lymphadenopathy:     Head:     Right side of head: No tonsillar adenopathy.     Left side of head: No tonsillar adenopathy.     Cervical: No cervical adenopathy.  Skin:    Findings: No erythema or rash.     Nails: There is no clubbing.  Neurological:     Mental Status: She is alert.     Diagnostics: Allergy skin tests were not performed.   Results of blood test dated 17 May 2021 identifies WBC 4.8, absolute eosinophil 100,  absolute lymphocyte 1700, hemoglobin 12.7, platelet 220.   Assessment and Plan:    1. Perennial allergic rhinitis   2. Seasonal allergic rhinitis due to pollen   3. Adverse food reaction, initial encounter   4. History of asthma   5. Dry eye syndrome of both eyes     1. Treat and prevent inflammation:   A. Fluticasone - 1-2 sprays each nostril 1 time per day  B. Montelukast 10 mg - 1 tablet 1 time per day   2. If needed:   A. Patanase - 1-2 sprays each nostril 1-2 times per day  B. Systane eye drops  C. Epi-Pen, benadryl, MD/ER evaluation for allergic reaction  3.  Do not use any oral antihistamines secondary to dry eye  4.  Albuterol HFA???  5.  Blood - Area 2 aeroallergen profile, Shellfish panel  6. Return to clinic in 4 weeks or earlier if needed  We will have Katha use a combination of a nasal steroid and a leukotrienes modifier to see if we can help with the atopic component of her respiratory tract and conjunctival issues.  She also has dry eye syndrome and she should remain away from all oral antihistamines.  We will work through her atopic disease and her apparent shrimp allergy in more detail with the blood test noted above.  She has not used a short acting bronchodilator in years for her distant history of asthma and I question whether or not she actually needs a prescription for this agent at this point.  She does have a history that is consistent with oral allergy syndrome and she will remain away from consumption of specific food products.  I will regroup with her in 4 weeks or earlier if there is a problem.  Jiles Prows, MD Allergy / Immunology [facility]

## 2020-09-16 NOTE — Patient Instructions (Addendum)
  1. Treat and prevent inflammation:   A. Fluticasone - 1-2 sprays each nostril 1 time per day  B. Montelukast 10 mg - 1 tablet 1 time per day   2. If needed:   A. Patanase - 1-2 sprays each nostril 1-2 times per day  B. Systane eye drops  C. Epi-Pen, benadryl, MD/ER evaluation for allergic reaction  3.  Do not use any oral antihistamines secondary to dry eye  4.  Albuterol HFA???  5.  Blood - Area 2 aeroallergen profile, Shellfish panel  6. Return to clinic in 4 weeks or earlier if needed

## 2020-09-17 ENCOUNTER — Encounter: Payer: Self-pay | Admitting: Allergy and Immunology

## 2020-09-17 ENCOUNTER — Other Ambulatory Visit: Payer: Self-pay

## 2020-09-17 NOTE — Telephone Encounter (Signed)
Spoke with pts pharmacist pts insurance covers flonase at $5 nothing else is covered for her insurance so they believe this is the step therapy

## 2020-09-17 NOTE — Telephone Encounter (Signed)
Pt called stating she received a message from our office,

## 2020-09-17 NOTE — Telephone Encounter (Signed)
Can we change to 0.03% nasal spray and see if insurance will cover it

## 2020-09-17 NOTE — Telephone Encounter (Signed)
Sent in patanase and put dispense brand or generic as it is a preferred tier 1

## 2020-09-22 LAB — ALLERGENS W/TOTAL IGE AREA 2
Alternaria Alternata IgE: <0.1 kU/L
Aspergillus Fumigatus IgE: <0.1 kU/L
Bermuda Grass IgE: <0.1 kU/L
Cat Dander IgE: <0.1 kU/L
Cedar, Mountain IgE: <0.1 kU/L
Cladosporium Herbarum IgE: <0.1 kU/L
Cockroach, German IgE: 0.17 kU/L — AB
Common Silver Birch IgE: <0.1 kU/L
Cottonwood IgE: <0.1 kU/L
D Farinae IgE: 0.46 kU/L — AB
D Pteronyssinus IgE: 0.33 kU/L — AB
Dog Dander IgE: <0.1 kU/L
Elm, American IgE: <0.1 kU/L
IgE (Immunoglobulin E), Serum: 134 IU/mL (ref 6–495)
Johnson Grass IgE: <0.1 kU/L
Maple/Box Elder IgE: <0.1 kU/L
Mouse Urine IgE: <0.1 kU/L
Oak, White IgE: <0.1 kU/L
Pecan, Hickory IgE: <0.1 kU/L
Penicillium Chrysogen IgE: <0.1 kU/L
Pigweed, Rough IgE: <0.1 kU/L
Ragweed, Short IgE: <0.1 kU/L
Sheep Sorrel IgE Qn: <0.1 kU/L
Timothy Grass IgE: <0.1 kU/L
White Mulberry IgE: <0.1 kU/L

## 2020-09-22 LAB — ALLERGEN PROFILE, SHELLFISH
Clam IgE: 0.12 kU/L — AB
F023-IgE Crab: 0.27 kU/L — AB
F080-IgE Lobster: 0.35 kU/L — AB
F290-IgE Oyster: <0.1 kU/L
Scallop IgE: 0.12 kU/L — AB
Shrimp IgE: 0.36 kU/L — AB

## 2020-09-22 NOTE — Telephone Encounter (Signed)
Patient called back stating the pharmacy still hadn't filled her ProAir Inhaler, Flonase & Olopatadine.  I placed the patient on hold & contacted the pharmacy myself. The pharmacy states the patient will need step therapy for the Olopatadine HCl 0.6 %, flonase & the ProAir. At this point, I do believe the patients insurance needs to be contacted to figure out what we can send in for the patient. Patient is very upset & really needs something sent in soon for her Allergies.   Can you give her insurance a call to figure out what is needed for the step therapy?    [employer]  [phone]

## 2020-09-22 NOTE — Telephone Encounter (Signed)
Kimberly [name] could you please take care of this tomorrow morning 09/23/2020. Also patient is wanting a call back to see what the outcome is. Thank you.

## 2020-09-23 NOTE — Telephone Encounter (Signed)
Coverage for olopatadine nasal spray was denied. Pt has to try and fail either Flonase, flunisolide or azelastine first.

## 2020-09-23 NOTE — Telephone Encounter (Signed)
Only history of nasal sprays we have is flonase and then veramyst in 2015.

## 2020-09-23 NOTE — Telephone Encounter (Signed)
Yes failed flonase and Jarrett Soho put that in the pa when she submitted it

## 2020-09-23 NOTE — Telephone Encounter (Signed)
After 45 minutes on the phone with insurance louise transferred me to three different people  (did not catch their names) then transferred me to [facility] to where I spoke with Kandice Moos.  she stated olopatadine 0.6% is an open rx, and can not give me step theraphy on this currently and can not tell me what is covered as it is a open rx, no determinations are made, and she did not explain that any further to me. As for the proair she is not having to use her proair that much, can we just give her a sample of the proair digihaler? Also the Flonase at the pharmacy will only be $5. Please advise to Flonase nasal spray and sample of digihaler.

## 2020-09-23 NOTE — Telephone Encounter (Signed)
Okay with Flonase nasal spray and sample of digihaler.

## 2020-09-23 NOTE — Telephone Encounter (Signed)
Also this original message was form rx request, when carla reopened it im not sure how she reopened it

## 2020-09-23 NOTE — Telephone Encounter (Signed)
Has she failed these previously?

## 2020-09-23 NOTE — Telephone Encounter (Signed)
Representative from [place] called regarding prior authorization for olopatadine nasal spray.  I answered questions to the best of my ability regarding what patient has previously tried.  They will fax a coverage determination to the [place] office.

## 2020-09-23 NOTE — Telephone Encounter (Signed)
So she has failed Flonase.  Does not qualify her for olopatadine?

## 2020-09-24 ENCOUNTER — Other Ambulatory Visit: Payer: Self-pay

## 2020-09-24 DIAGNOSIS — L814 Other melanin hyperpigmentation: Secondary | ICD-10-CM | POA: Diagnosis not present

## 2020-09-24 DIAGNOSIS — L570 Actinic keratosis: Secondary | ICD-10-CM | POA: Diagnosis not present

## 2020-09-24 DIAGNOSIS — L820 Inflamed seborrheic keratosis: Secondary | ICD-10-CM | POA: Diagnosis not present

## 2020-09-24 DIAGNOSIS — M6740 Ganglion, unspecified site: Secondary | ICD-10-CM | POA: Diagnosis not present

## 2020-09-24 DIAGNOSIS — L821 Other seborrheic keratosis: Secondary | ICD-10-CM | POA: Diagnosis not present

## 2020-09-24 DIAGNOSIS — Z85828 Personal history of other malignant neoplasm of skin: Secondary | ICD-10-CM | POA: Diagnosis not present

## 2020-09-24 DIAGNOSIS — D1801 Hemangioma of skin and subcutaneous tissue: Secondary | ICD-10-CM | POA: Diagnosis not present

## 2020-09-24 MED ORDER — AZELASTINE HCL 0.1 % NA SOLN: 1.0000 | Freq: Two times a day (BID) | NASAL | 5 refills | Status: DC

## 2020-09-24 NOTE — Telephone Encounter (Signed)
Ahhh.... two failures!!!. Have her try Azelastine - 1 spray each nostril 2 times a day and when she fails she can contact this clinic and we can submit for olopatadine

## 2020-09-24 NOTE — Telephone Encounter (Signed)
I called ashley hicks lpn and she reread the pa denial report to me to be accurate, pt has to try and fail two preferred either fluticasone, flunisolide, or azelastine. Yes, pt has tried and failed fluticasone.

## 2020-09-24 NOTE — Telephone Encounter (Signed)
If she failed Flonase and that is the criteria for getting olopatadine why are we not getting olopatadine prescribed for her?

## 2020-09-24 NOTE — Telephone Encounter (Signed)
Lm for pt to call us back about the following:  Informed [facility] LPN to put a proair digihaler at front desk for pt to pick up  Will send in rx once Dr Neldon Mc confirms directions of use for azelastine

## 2020-09-24 NOTE — Telephone Encounter (Signed)
Patient called back and I informed her of Carrie's message. She asked if she was to use this in place of the fluticasone?

## 2020-09-24 NOTE — Telephone Encounter (Signed)
rx for azelastine has been sent in 1 spray each nostril twice daily still waiting on pts call back

## 2020-09-24 NOTE — Telephone Encounter (Signed)
Called and spoke with patient, she stated that [place] works for her but sometimes she needs something else to help as well. She stated that in the office Dr. Neldon Mc was telling her to continue to use Flonase which she does want to do and if she needs another nasal spray to add on patanase. However since patanase was not covered by insurance we had to switch her to azelastine. After reviewing her after visit summary she is correct. I informed patient to continue Flonase and if she needs something else to add on the azelastine 1 spray each nostril twice a day as needed. I apologized to her for all the confusion, patient stated it happens she is just happy that everything was worked out and she was able to get something else to use if she needs it.

## 2020-09-26 ENCOUNTER — Telehealth: Payer: Self-pay

## 2020-09-26 MED ORDER — ALBUTEROL SULFATE HFA 108 (90 BASE) MCG/ACT IN AERS: 2.0000 | INHALATION_SPRAY | Freq: Four times a day (QID) | RESPIRATORY_TRACT | 3 refills | Status: DC | PRN

## 2020-09-26 NOTE — Telephone Encounter (Signed)
[employer] has reached out to our office and notified us that they will not cover the Proair inhaler due her not trying Ventolin. Patient was notified of her insurance's decision and stated that she has tried Ventolin and that only Proair works for her. I did look deeply in patients chart and have yet to find an previous order for ventolin. Patient stated that the ventolin can be sent in and that she would contact her insurance and inform us of any questions or concerns she may have in the future. Ventolin has been sent in.

## 2020-10-01 ENCOUNTER — Ambulatory Visit: Payer: Medicare PPO | Admitting: Podiatry

## 2020-10-03 DIAGNOSIS — H53412 Scotoma involving central area, left eye: Secondary | ICD-10-CM | POA: Diagnosis not present

## 2020-10-03 DIAGNOSIS — H4422 Degenerative myopia, left eye: Secondary | ICD-10-CM | POA: Diagnosis not present

## 2020-10-03 DIAGNOSIS — H04123 Dry eye syndrome of bilateral lacrimal glands: Secondary | ICD-10-CM | POA: Diagnosis not present

## 2020-10-03 DIAGNOSIS — Z961 Presence of intraocular lens: Secondary | ICD-10-CM | POA: Diagnosis not present

## 2020-10-03 DIAGNOSIS — H43811 Vitreous degeneration, right eye: Secondary | ICD-10-CM | POA: Diagnosis not present

## 2020-10-03 DIAGNOSIS — H35371 Puckering of macula, right eye: Secondary | ICD-10-CM | POA: Diagnosis not present

## 2020-10-03 DIAGNOSIS — H31091 Other chorioretinal scars, right eye: Secondary | ICD-10-CM | POA: Diagnosis not present

## 2020-10-14 ENCOUNTER — Other Ambulatory Visit: Payer: Self-pay

## 2020-10-14 ENCOUNTER — Encounter: Payer: Self-pay | Admitting: Allergy and Immunology

## 2020-10-14 ENCOUNTER — Ambulatory Visit: Payer: Medicare PPO | Admitting: Allergy and Immunology

## 2020-10-14 VITALS — BP 130/70 | HR 59 | Temp 98.2°F | Resp 16 | Ht 67.5 in | Wt 170.0 lb

## 2020-10-14 DIAGNOSIS — T781XXA Other adverse food reactions, not elsewhere classified, initial encounter: Secondary | ICD-10-CM

## 2020-10-14 DIAGNOSIS — J3089 Other allergic rhinitis: Secondary | ICD-10-CM | POA: Diagnosis not present

## 2020-10-14 DIAGNOSIS — Z8709 Personal history of other diseases of the respiratory system: Secondary | ICD-10-CM | POA: Diagnosis not present

## 2020-10-14 DIAGNOSIS — H04123 Dry eye syndrome of bilateral lacrimal glands: Secondary | ICD-10-CM | POA: Diagnosis not present

## 2020-10-14 MED ORDER — OLOPATADINE HCL 0.6 % NA SOLN: 2.0000 | Freq: Two times a day (BID) | NASAL | 5 refills | Status: DC

## 2020-10-14 MED ORDER — EPINEPHRINE 0.3 MG/0.3ML IJ SOAJ: 0.3000 mg | INTRAMUSCULAR | 2 refills | Status: DC | PRN

## 2020-10-14 MED ORDER — AZELASTINE HCL 0.1 % NA SOLN: 1.0000 | Freq: Two times a day (BID) | NASAL | 5 refills | Status: DC

## 2020-10-14 MED ORDER — ALBUTEROL SULFATE HFA 108 (90 BASE) MCG/ACT IN AERS: 2.0000 | INHALATION_SPRAY | Freq: Four times a day (QID) | RESPIRATORY_TRACT | 3 refills | Status: DC | PRN

## 2020-10-14 MED ORDER — MONTELUKAST SODIUM 10 MG PO TABS: 10.0000 mg | ORAL_TABLET | Freq: Every day | ORAL | 5 refills | Status: DC

## 2020-10-14 MED ORDER — PROAIR HFA 108 (90 BASE) MCG/ACT IN AERS: 1.0000 | INHALATION_SPRAY | Freq: Four times a day (QID) | RESPIRATORY_TRACT | 3 refills | Status: DC | PRN

## 2020-10-14 NOTE — Patient Instructions (Addendum)
  1.  Continue to treat and prevent inflammation:   A. Fluticasone - 1-2 sprays each nostril 1 time per day  B. Montelukast 10 mg - 1 tablet 1 time per day  C. Perform dust mite avoidance measures  2. If needed:   A.  Azelastine- 1-2 sprays each nostril 1-2 times per day  B. Systane eye drops  C. Epi-Pen, benadryl, MD/ER evaluation for allergic reaction  D. Albuterol HFA - 2 inhalations every 4-6 hours  3.  Do not use any oral antihistamines secondary to dry eye  4.  Return to clinic in November 2022 or earlier if needed

## 2020-10-14 NOTE — Progress Notes (Signed)
[facility] [place] [facility] [place]   Follow-up Note  Referring Provider: Lajean Manes, MD Primary Provider: Lajean Manes, MD Date of Office Visit: 10/14/2020  Subjective:   Kimberly [name] (DOB: Sep 14, 1940) is a 80 y.o. female who returns to the [facility] on 10/14/2020 in re-evaluation of the following:  HPI: Kimberly [name] returns to this clinic in reevaluation of allergic rhinitis, history of shrimp allergy, history of asthma, and dry eye syndrome.  Her last visit to this clinic was her initial evaluation of 16 September 2020.  She believes that she is doing better regarding her airway issue while performing house dust mite avoidance measures and consistently using a combination of nasal fluticasone and montelukast.  She also believes that her eyes are less dry while eliminating all oral antihistamines and relying on the use of Systane multiple times per day.  She remains away from consuming shrimp at this point in time.  She also remains away from consumption of foods that give her itchy mouth such as [place] grapes and walnuts.  She has had no need to use any albuterol and can exert herself without any difficulty.  Allergies as of 10/14/2020      Reactions   Amoxicillin-pot Clavulanate Nausea Only, Swelling   Has patient had a PCN reaction causing immediate rash, facial/tongue/throat swelling, SOB or lightheadedness with hypotension: No Has patient had a PCN reaction causing severe rash involving mucus membranes or skin necrosis: No Has patient had a PCN reaction that required hospitalization No Has patient had a PCN reaction occurring within the last 10 years: No If all of the above answers are "NO", then may proceed with Cephalosporin use. Other reaction(s): nausea   Azithromycin Other (See Comments)   Pancreatitis Other reaction(s): Other, pancreatitis   Erythromycin Hives   Reaction 1 time, when she was younger. Reaction 1 time,  when she was younger. Other reaction(s): hives   Almond Oil Other (See Comments)   Almonds: per allergy testing.   Amoxicillin Nausea Only   Atorvastatin Other (See Comments)   Muscle weakness Muscle weakness Other reaction(s): muscle weakness, Other   Codeine Nausea Only   Other reaction(s): Nausea/Vomiting   Ketoconazole Nausea And Vomiting   Ketoconazole Nausea Only, Nausea And Vomiting   Other reaction(s): Unknown   Latex Other (See Comments)   Other   Lincomycin    Stomach upset    Morphine Nausea Only   Other reaction(s): Unknown   Morphine Sulfate Nausea Only, Other (See Comments)   Unknown   Morphine Sulfate    Other reaction(s): Unknown   Other Other (See Comments)   Shellfish mix: per allergy testing. Other reaction(s): nausea   Shellfish Allergy Other (See Comments)   Shellfish mix: per allergy testing.   Simvastatin Other (See Comments)   Muscle pain Other reaction(s): muscle pain, Other   Sulfamethoxazole-trimethoprim Nausea Only   Upset stomach   Clindamycin Rash   Clindamycin/lincomycin Nausea And Vomiting, Rash   Erythromycin Base Rash      Medication List      acetaminophen 500 MG tablet Commonly known as: TYLENOL Take 500 mg by mouth every 6 (six) hours as needed for moderate pain.   Align 4 MG Caps Take 1 capsule by mouth daily.   ascorbic acid 500 MG tablet Commonly known as: VITAMIN C Take by mouth.   azelastine 0.1 % nasal spray Commonly known as: ASTELIN Place 1 spray into both nostrils 2 (two) times daily. Use in  each nostril as directed   bisacodyl 5 MG EC tablet Commonly known as: DULCOLAX Take 5 mg by mouth daily as needed for moderate constipation.   CALCIUM PO Take by mouth.   diltiazem 180 MG 24 hr capsule Commonly known as: CARDIZEM CD TAKE 1 CAPSULE BY MOUTH EVERY DAY   Eliquis 5 MG Tabs tablet Generic drug: apixaban TAKE 1 TABLET BY MOUTH TWICE A DAY   EPINEPHrine 0.3 mg/0.3 mL Soaj injection Commonly known as:  EPI-PEN Inject 0.3 mg into the muscle as needed (for allergic reaction). Reported on 05/28/2015   famotidine 20 MG tablet Commonly known as: PEPCID Take 20 mg by mouth 2 (two) times daily.   fluticasone 50 MCG/ACT nasal spray Commonly known as: FLONASE Place 2 sprays into both nostrils 2 (two) times daily as needed for allergies.   levothyroxine 100 MCG tablet Commonly known as: SYNTHROID Take 100 mcg by mouth daily before breakfast.   MIRALAX PO Take 17 g by mouth daily as needed (constipation).   montelukast 10 MG tablet Commonly known as: SINGULAIR Take 1 tablet (10 mg total) by mouth at bedtime.   Olopatadine HCl 0.6 % Soln Place 2 sprays into both nostrils 2 (two) times daily.   ondansetron 4 MG disintegrating tablet Commonly known as: Zofran ODT Take 1 tablet (4 mg total) by mouth every 6 (six) hours as needed.   ProAir HFA 108 (90 Base) MCG/ACT inhaler Generic drug: albuterol Inhale 1-2 puffs into the lungs every 6 (six) hours as needed for wheezing.   albuterol 108 (90 Base) MCG/ACT inhaler Commonly known as: VENTOLIN HFA Inhale 2 puffs into the lungs every 6 (six) hours as needed for wheezing or shortness of breath.   psyllium 58.6 % powder Commonly known as: METAMUCIL Take 1 packet by mouth daily as needed (constipation).   Restasis 0.05 % ophthalmic emulsion Generic drug: cycloSPORINE Place 1 drop into both eyes 2 (two) times daily. Uses as directed   rosuvastatin 10 MG tablet Commonly known as: CRESTOR Take 5 mg by mouth at bedtime.   Systane 0.4-0.3 % Soln Generic drug: Polyethyl Glycol-Propyl Glycol Apply 2 drops to eye daily as needed.   SYSTANE BALANCE OP Place 1 drop into both eyes daily as needed (dry eyes).   VITAMIN D PO Take by mouth.   Zofran 4 MG/5ML solution Generic drug: ondansetron See admin instructions.       Past Medical History:  Diagnosis Date  . Allergy   . Anal polyp 1998   Flex Sig   . Anemia   . Angular  blepharitis of left eye   . Ankle fracture    Stress fracture  . Anxiety   . Asthma    border line has inhaler  . Bunion   . Cataracts, bilateral   . Corneal scar    left eye  . CPAP (continuous positive airway pressure) dependence   . Degenerative disc disease   . Depression   . Diverticulosis of colon (without mention of hemorrhage) 2010   Colonoscopy  . Dry eyes    bilateral  . Enterocele   . External hemorrhoids 2000   Colonoscopy  . Family history of malignant neoplasm of gastrointestinal tract   . Female cystocele   . Fibromyalgia   . GERD (gastroesophageal reflux disease)   . Hiatal hernia 2005,2010   EGD  . History of bronchitis   . History of measles   . History of mumps   . History of strep sore throat   .  History of urinary tract infection   . Hyperlipemia   . Hypothyroidism   . IBS (irritable bowel syndrome)   . Imbalance   . Internal hemorrhoids without mention of complication 0000000   Colonoscopy   . Itching   . 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 Menopause   . Migraine   . OSA (obstructive sleep apnea) 05/14/2015   on BiPAP  . Osteopenia 10/2013   T score -1.6 FRAX 10%/1.6%  . PAF (paroxysmal atrial fibrillation) ([place])   . Pancreatitis   . PCO (posterior capsular opacification)    left  . Pneumonia    childhood illness  . PONV (postoperative nausea and vomiting)   . Pseudophakia, both eyes   . PVD (posterior vitreous detachment) right  . Rash    on back   . Retinal scar    left  . Rotator cuff disorder    pain, left shoulder  . Shingles   . Stricture and stenosis of esophagus 2005,2010   EGD   . Stroke ([place])   . Ulcerative colitis ([place])   . Varicose veins   . Vertigo   . Wears glasses     Past Surgical History:  Procedure Laterality Date  . ABDOMINAL HYSTERECTOMY    . ANAL FISSURE REPAIR    . BREAST EXCISIONAL BIOPSY Left   . BUNIONECTOMY  2014  . CATARACT EXTRACTION Bilateral 2013  . COLONOSCOPY     polyp removed  . ESOPHAGEAL  DILATION    . EYE SURGERY Left   . FOOT SURGERY     left   . HEMORRHOID SURGERY    . MOUTH SURGERY  11/13/11   Cyst removed from gum-benign   . NASAL SEPTUM SURGERY    . NASAL SEPTUM SURGERY  1970's  . REPLACEMENT TOTAL KNEE Right 2011  . TONSILLECTOMY    . TOTAL ABDOMINAL HYSTERECTOMY W/ BILATERAL SALPINGOOPHORECTOMY  1991   TAH BSO  . TOTAL HIP ARTHROPLASTY     right   . TOTAL KNEE ARTHROPLASTY     both  . TOTAL KNEE ARTHROPLASTY Left 06/23/2015   Procedure: LEFT TOTAL KNEE ARTHROPLASTY;  Surgeon: Gaynelle Arabian, MD;  Location: [facility];  Service: Orthopedics;  Laterality: Left;    Review of systems negative except as noted in HPI / PMHx or noted below:  Review of Systems  Constitutional: Negative.   HENT: Negative.   Eyes: Negative.   Respiratory: Negative.   Cardiovascular: Negative.   Gastrointestinal: Negative.   Genitourinary: Negative.   Musculoskeletal: Negative.   Skin: Negative.   Neurological: Negative.   Endo/Heme/Allergies: Negative.   Psychiatric/Behavioral: Negative.      Objective:   Vitals:   10/14/20 1109  BP: 130/70  Pulse: (!) 59  Resp: 16  Temp: 98.2 F (36.8 C)  SpO2: 95%   Height: 5' 7.5" (171.5 cm)  Weight: 170 lb (77.1 kg)   Physical Exam Constitutional:      Appearance: She is not diaphoretic.  HENT:     Head: Normocephalic.     Right Ear: Tympanic membrane, ear canal and external ear normal.     Left Ear: Tympanic membrane, ear canal and external ear normal.     Nose: Nose normal. No mucosal edema or rhinorrhea.     Mouth/Throat:     Pharynx: Uvula midline. No oropharyngeal exudate.  Eyes:     Conjunctiva/sclera: Conjunctivae normal.  Neck:     Thyroid: No thyromegaly.     Trachea: Trachea normal. No tracheal tenderness or  tracheal deviation.  Cardiovascular:     Rate and Rhythm: Normal rate and regular rhythm.     Heart sounds: Normal heart sounds, S1 normal and S2 normal. No murmur heard.   Pulmonary:     Effort: No  respiratory distress.     Breath sounds: Normal breath sounds. No stridor. No wheezing or rales.  Lymphadenopathy:     Head:     Right side of head: No tonsillar adenopathy.     Left side of head: No tonsillar adenopathy.     Cervical: No cervical adenopathy.  Skin:    Findings: No erythema or rash.     Nails: There is no clubbing.  Neurological:     Mental Status: She is alert.     Diagnostics:   Results of blood tests obtained 16 September 2020 identified IgE antibodies directed against multiple members of shellfish, dust mite, cockroach  Assessment and Plan:   1. Perennial allergic rhinitis   2. Adverse food reaction, initial encounter   3. History of asthma   4. Dry eye syndrome of both eyes     1.  Continue to treat and prevent inflammation:   A. Fluticasone - 1-2 sprays each nostril 1 time per day  B. Montelukast 10 mg - 1 tablet 1 time per day  C. Perform dust mite avoidance measures  2. If needed:   A.  Azelastine- 1-2 sprays each nostril 1-2 times per day  B. Systane eye drops  C. Epi-Pen, benadryl, MD/ER evaluation for allergic reaction  D. Albuterol HFA - 2 inhalations every 4-6 hours  3.  Do not use any oral antihistamines secondary to dry eye  4.  Return to clinic in November 2022 or earlier if needed  I think that Ladoris appears to be doing better on her current plan and we will keep her on anti-inflammatory agents for her airway and also address the issue of her food allergy with avoidance measures.  Assuming she does well on this plan I will see her back in this clinic in 6 months or earlier if there is a problem.  Allena Katz, MD [facility] [place]

## 2020-10-15 ENCOUNTER — Encounter: Payer: Self-pay | Admitting: Allergy and Immunology

## 2020-10-17 NOTE — Telephone Encounter (Signed)
Patient called stating her insurance will not cover proair inhaler, she would have to pay $189 to get inhaler. Patient confirmed she has [place] as her insurance. Patient was wondering if an alternative could be sent in for her to try. Patient was wondering if any samples of the proair could be left up front for her to pick up until she can get her insurance to cover it.    Patient called back around 10 am stating she received the albuterol inhaler (ventolin) and if that is what Dr. Neldon Mc wants her to use then she would be fine with that.   Please advise.

## 2020-10-17 NOTE — Telephone Encounter (Signed)
Pt has ventolin already with her, it was around 15-20 dollars for pt.

## 2020-10-20 DIAGNOSIS — H04121 Dry eye syndrome of right lacrimal gland: Secondary | ICD-10-CM | POA: Diagnosis not present

## 2020-10-20 DIAGNOSIS — H04122 Dry eye syndrome of left lacrimal gland: Secondary | ICD-10-CM | POA: Diagnosis not present

## 2020-10-20 DIAGNOSIS — H04123 Dry eye syndrome of bilateral lacrimal glands: Secondary | ICD-10-CM | POA: Diagnosis not present

## 2020-10-22 DIAGNOSIS — R3129 Other microscopic hematuria: Secondary | ICD-10-CM | POA: Diagnosis not present

## 2020-10-22 DIAGNOSIS — N812 Incomplete uterovaginal prolapse: Secondary | ICD-10-CM | POA: Diagnosis not present

## 2020-10-24 ENCOUNTER — Telehealth: Payer: Self-pay | Admitting: Podiatry

## 2020-10-24 ENCOUNTER — Ambulatory Visit: Payer: Medicare PPO | Admitting: Podiatry

## 2020-10-24 NOTE — Telephone Encounter (Signed)
Patient called wanting to reschedule appointment for today, stating they had bad weather warning and was informed not to leave home also expressed concerns regarding no show fee and wanted to make sure Elisha Ponder was aware of the reason why they didn't come into office.

## 2020-10-24 NOTE — Telephone Encounter (Signed)
You're welcome!

## 2020-10-24 NOTE — Telephone Encounter (Signed)
Noted. No problem. Thank you Doren Custard!

## 2020-10-30 ENCOUNTER — Telehealth: Payer: Self-pay | Admitting: Cardiology

## 2020-10-30 NOTE — Telephone Encounter (Signed)
Spoke with pt and she was concerned about upcoming cystoscopy and Urology office not being aware of her Eliquis.  Advised pt to contact Dr. Zigmund Daniel and let them know she is taking Eliquis so it does not delay her procedure.  Inquired about the chills mentioned and she states they have been going on for some time.  Advised those could be coming from numerous issues.  Denies fever or body aches.  Advised to reach out to PCP about chills.  Pt will contact Dr. Zigmund Daniel office and have them send something or call our office in regards to her cystoscopy so we can get her cleared.

## 2020-10-30 NOTE — Telephone Encounter (Signed)
    Pt said, she is scheduled to get cystoscopy of her bladder because her urine blood test TH dipstick came back 7.0. when they gave her AVS her eliquis is not listed there and now she is very concern and having anxiety about the procedure plus her husband's illness. She said she's been having chills for the last 3 days that they also did not include on her AVS. She said if Dr. Radford Pax recommend to have an appt with her she will be happy to do that.   PS. Advised pt to ask her doctor send [place] pre-op clearance request and she said she will call her doctor to send one. She just wanted to send this message to Dr. Radford Pax and get her recommendations

## 2020-11-05 ENCOUNTER — Emergency Department (HOSPITAL_COMMUNITY): Payer: Medicare PPO

## 2020-11-05 ENCOUNTER — Emergency Department (HOSPITAL_COMMUNITY)
Admission: EM | Admit: 2020-11-05 | Discharge: 2020-11-05 | Disposition: A | Discharge status: Home / Self Care | Payer: Medicare PPO | Attending: Emergency Medicine | Admitting: Emergency Medicine

## 2020-11-05 ENCOUNTER — Telehealth: Payer: Self-pay | Admitting: Cardiology

## 2020-11-05 ENCOUNTER — Telehealth: Payer: Self-pay | Admitting: *Deleted

## 2020-11-05 ENCOUNTER — Encounter (HOSPITAL_COMMUNITY): Payer: Self-pay

## 2020-11-05 ENCOUNTER — Other Ambulatory Visit: Payer: Self-pay

## 2020-11-05 DIAGNOSIS — Z96653 Presence of artificial knee joint, bilateral: Secondary | ICD-10-CM | POA: Diagnosis not present

## 2020-11-05 DIAGNOSIS — E039 Hypothyroidism, unspecified: Secondary | ICD-10-CM | POA: Insufficient documentation

## 2020-11-05 DIAGNOSIS — I480 Paroxysmal atrial fibrillation: Secondary | ICD-10-CM

## 2020-11-05 DIAGNOSIS — Z7901 Long term (current) use of anticoagulants: Secondary | ICD-10-CM | POA: Insufficient documentation

## 2020-11-05 DIAGNOSIS — Z87891 Personal history of nicotine dependence: Secondary | ICD-10-CM | POA: Diagnosis not present

## 2020-11-05 DIAGNOSIS — Z96641 Presence of right artificial hip joint: Secondary | ICD-10-CM | POA: Diagnosis not present

## 2020-11-05 DIAGNOSIS — Z79899 Other long term (current) drug therapy: Secondary | ICD-10-CM | POA: Diagnosis not present

## 2020-11-05 DIAGNOSIS — I4891 Unspecified atrial fibrillation: Secondary | ICD-10-CM | POA: Insufficient documentation

## 2020-11-05 DIAGNOSIS — R079 Chest pain, unspecified: Secondary | ICD-10-CM | POA: Diagnosis not present

## 2020-11-05 DIAGNOSIS — J45909 Unspecified asthma, uncomplicated: Secondary | ICD-10-CM | POA: Diagnosis not present

## 2020-11-05 DIAGNOSIS — Z91040 Latex allergy status: Secondary | ICD-10-CM | POA: Diagnosis not present

## 2020-11-05 DIAGNOSIS — R002 Palpitations: Secondary | ICD-10-CM | POA: Diagnosis present

## 2020-11-05 DIAGNOSIS — R0600 Dyspnea, unspecified: Secondary | ICD-10-CM | POA: Diagnosis not present

## 2020-11-05 LAB — COMPREHENSIVE METABOLIC PANEL
ALT: 15 U/L (ref 0–44)
AST: 23 U/L (ref 15–41)
Albumin: 3.9 g/dL (ref 3.5–5.0)
Alkaline Phosphatase: 46 U/L (ref 38–126)
Anion gap: 9 (ref 5–15)
BUN: 16 mg/dL (ref 8–23)
CO2: 25 mmol/L (ref 22–32)
Calcium: 9.5 mg/dL (ref 8.9–10.3)
Chloride: 104 mmol/L (ref 98–111)
Creatinine, Ser: 0.8 mg/dL (ref 0.44–1.00)
GFR, Estimated: >60 mL/min (ref >60)
Glucose, Bld: 100 mg/dL — ABNORMAL HIGH (ref 70–99)
Potassium: 3.6 mmol/L (ref 3.5–5.1)
Sodium: 138 mmol/L (ref 135–145)
Total Bilirubin: 0.5 mg/dL (ref 0.3–1.2)
Total Protein: 6.9 g/dL (ref 6.5–8.1)

## 2020-11-05 LAB — CBC WITH DIFFERENTIAL/PLATELET
Abs Immature Granulocytes: 0.02 10*3/uL (ref 0.00–0.07)
Basophils Absolute: 0 10*3/uL (ref 0.0–0.1)
Basophils Relative: 1 %
Eosinophils Absolute: 0.1 10*3/uL (ref 0.0–0.5)
Eosinophils Relative: 2 %
HCT: 40.3 % (ref 36.0–46.0)
Hemoglobin: 13.3 g/dL (ref 12.0–15.0)
Immature Granulocytes: 0 %
Lymphocytes Relative: 39 %
Lymphs Abs: 2.6 10*3/uL (ref 0.7–4.0)
MCH: 31.3 pg (ref 26.0–34.0)
MCHC: 33 g/dL (ref 30.0–36.0)
MCV: 94.8 fL (ref 80.0–100.0)
Monocytes Absolute: 0.7 10*3/uL (ref 0.1–1.0)
Monocytes Relative: 10 %
Neutro Abs: 3.2 10*3/uL (ref 1.7–7.7)
Neutrophils Relative %: 48 %
Platelets: 217 10*3/uL (ref 150–400)
RBC: 4.25 MIL/uL (ref 3.87–5.11)
RDW: 12.3 % (ref 11.5–15.5)
WBC: 6.6 10*3/uL (ref 4.0–10.5)
nRBC: 0 % (ref 0.0–0.2)

## 2020-11-05 LAB — URINALYSIS, DIPSTICK ONLY
Bilirubin Urine: NEGATIVE
Glucose, UA: NEGATIVE mg/dL
Ketones, ur: 5 mg/dL — AB
Leukocytes,Ua: NEGATIVE
Nitrite: NEGATIVE
Protein, ur: NEGATIVE mg/dL
Specific Gravity, Urine: 1.003 — ABNORMAL LOW (ref 1.005–1.030)
pH: 7 (ref 5.0–8.0)

## 2020-11-05 LAB — TROPONIN I (HIGH SENSITIVITY): Troponin I (High Sensitivity): 7 ng/L (ref <18)

## 2020-11-05 MED ORDER — SODIUM CHLORIDE 0.9 % IV BOLUS
500.0000 mL | Freq: Once | INTRAVENOUS | Status: AC
  Administered 2020-11-05: 500 mL via INTRAVENOUS

## 2020-11-05 NOTE — Telephone Encounter (Signed)
Patient went to the ED around [facility] 11/04/20. She was released the morning of 11/05/20 and was told to follow up with her Cardiologist asap. The patient was concerned when she was told she could not get an appt until September and she wanted to know if there was anything the Nurse could do to get her in sooner.  Please assist

## 2020-11-05 NOTE — Telephone Encounter (Signed)
Pt called to review her AVS and test results.  RNCM reviewed AVS with pt and gave high level overview of hospital chief complaint [facility]).  Pt states she was able to secure timely hospital follow-up with PCP but not cardiology.  She states she has been in contact with cardiologist office and hopes they can fit her in soon.  Pt very complimentary of [place] and the care she received.

## 2020-11-05 NOTE — ED Triage Notes (Signed)
Patient reports she had some chest pain this morning that has since resolved, had some R leg pain that felt like veins bursting, got back into bed and noticed her afib was acting up, states her pain in the morning did not feel like her afib, reports she is under some stress, on Eliquis and Cardizem for afib

## 2020-11-05 NOTE — Telephone Encounter (Signed)
Kimberly Margarita, MD  Antonieta Iba, RN Please have her set up with afib clinic for recurrent PAF with RVR requiring ER visit

## 2020-11-05 NOTE — Telephone Encounter (Signed)
Received message in regards to if our office has received a clearance request for this pt. I have not seen a clearance request faxed to our office as of today. See notes states Dr. Zigmund Daniel [facility]. I did find Dr. [place] Pink with Urology with [facility] I called Dr. Zigmund Daniel office and left message for a clearance requesto be faxed to our office [phone], need procedure being done, anesthesia if any, meds to be held, fax #, date of surgery.

## 2020-11-05 NOTE — Telephone Encounter (Signed)
Spoke with the patient who states that she went to the ED yesterday for A Fib and was told to follow up with cardiology. She reports that she is feeling better today. She denies chest pain. Reports some weakness and fatigue.  Patient scheduled for 7/7 with Dr. Radford Pax

## 2020-11-05 NOTE — Addendum Note (Signed)
Addended by: Antonieta Iba on: 11/05/2020 12:19 PM   Modules accepted: Orders

## 2020-11-05 NOTE — ED Provider Notes (Signed)
[facility] DEPARTMENT Provider Note  CSN: [id number] Arrival date & time: 11/05/20 0135  Chief Complaint(s) Atrial Fibrillation and Chest Pain  HPI Kimberly [name] is a 80 y.o. female with a past medical history listed below including paroxysmal A. fib on Eliquis who presents to the emergency department with sudden onset palpitations that began 1 hour prior to arrival.  She denies any associated chest pain or shortness of breath.  Patient denies any recent fevers or infections.  No cough or congestion.  No nausea or vomiting.  Patient has been having increased allergies recently and started back on Singulair.  No other medication changes.  Patient has been compliant with her home medications.  She did report earlier yesterday morning she had chest tightness lasted for less than an hour and gradually resolved.  This is chest tightness she has felt in the past related to stress.  She reports that this episode was also related to stress.  Since her pain resolved, it has not recurred.  HPI  Past Medical History Past Medical History:  Diagnosis Date  . Allergy   . Anal polyp 1998   Flex Sig   . Anemia   . Angular blepharitis of left eye   . Ankle fracture    Stress fracture  . Anxiety   . Asthma    border line has inhaler  . Bunion   . Cataracts, bilateral   . Corneal scar    left eye  . CPAP (continuous positive airway pressure) dependence   . Degenerative disc disease   . Depression   . Diverticulosis of colon (without mention of hemorrhage) 2010   Colonoscopy  . Dry eyes    bilateral  . Enterocele   . External hemorrhoids 2000   Colonoscopy  . Family history of malignant neoplasm of gastrointestinal tract   . Female cystocele   . Fibromyalgia   . GERD (gastroesophageal reflux disease)   . Hiatal hernia 2005,2010   EGD  . History of bronchitis   . History of measles   . History of mumps   . History of strep sore throat   . History of  urinary tract infection   . Hyperlipemia   . Hypothyroidism   . IBS (irritable bowel syndrome)   . Imbalance   . Internal hemorrhoids without mention of complication 8333,8329   Colonoscopy   . Itching   . 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 Menopause   . Migraine   . OSA (obstructive sleep apnea) 05/14/2015   on BiPAP  . Osteopenia 10/2013   T score -1.6 FRAX 10%/1.6%  . PAF (paroxysmal atrial fibrillation) ([place])   . Pancreatitis   . PCO (posterior capsular opacification)    left  . Pneumonia    childhood illness  . PONV (postoperative nausea and vomiting)   . Pseudophakia, both eyes   . PVD (posterior vitreous detachment) right  . Rash    on back   . Retinal scar    left  . Rotator cuff disorder    pain, left shoulder  . Shingles   . Stricture and stenosis of esophagus 2005,2010   EGD   . Stroke ([place])   . Ulcerative colitis ([place])   . Varicose veins   . Vertigo   . Wears glasses    Patient Active Problem List   Diagnosis Date Noted  . Corns and callosities 05/20/2019  . Pain in both feet 05/20/2019  . Chest pain 01/28/2019  . Chest  pain of uncertain etiology 42/68/3419  . Adiposity 07/13/2018  . Excess weight 07/13/2018  . Hay fever 07/13/2018  . PAF (paroxysmal atrial fibrillation) ([place]) 08/25/2017  . Syncope 05/13/2016  . Heart palpitations 05/13/2016  . Rhinitis, chronic 02/04/2016  . IBS (irritable bowel syndrome) 07/14/2015  . Nausea without vomiting 07/14/2015  . OA (osteoarthritis) of knee 06/23/2015  . OSA (obstructive sleep apnea) 05/14/2015  . Preoperative clearance 01/08/2015  . Acute infection of nasal sinus 02/25/2014  . Pancreatitis 07/22/2013  . Leukocytosis, unspecified 07/22/2013  . Amblyopia 03/07/2013  . Retinal scar 03/07/2013  . Cellulitis 01/15/2013  . 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 Angular blepharitis 10/11/2012  . Dry eyes 10/11/2012  . PCO (posterior capsular opacification) 10/11/2012  . PVD (posterior vitreous  detachment) 10/11/2012  . 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 Cataracts, bilateral   . Elevated cholesterol   . Degenerative disc disease   . Bunion   . Ankle fracture   . 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 Hemorrhoid   . DUB (dysfunctional uterine bleeding)   . DIARRHEA 09/16/2009  . PERSONAL HX COLONIC POLYPS 09/16/2009  . LACUNAR INFARCTION 07/16/2008  . CONSTIPATION 07/12/2008  . CHEST PAIN 07/12/2008  . DYSPHAGIA 07/12/2008  . Hypothyroidism 07/10/2008  . Anxiety state 07/10/2008  . DEPRESSION 07/10/2008  . INTERNAL HEMORRHOIDS 07/10/2008  . ESOPHAGEAL STRICTURE 07/10/2008  . GERD 07/10/2008  . HIATAL HERNIA 07/10/2008  . DIVERTICULOSIS, COLON 07/10/2008  . IRRITABLE BOWEL SYNDROME 07/10/2008  . Osteoarthritis 07/10/2008  . FIBROMYALGIA 07/10/2008  . Blues 07/10/2008   Home Medication(s) Prior to Admission medications   Medication Sig Start Date End Date Taking? Authorizing Provider  acetaminophen (TYLENOL) 500 MG tablet Take 500 mg by mouth every 6 (six) hours as needed for moderate pain.     [provider]  albuterol (VENTOLIN HFA) 108 (90 Base) MCG/ACT inhaler Inhale 2 puffs into the lungs every 6 (six) hours as needed for wheezing or shortness of breath. 10/14/20   Kozlow, Donnamarie Poag, MD  ascorbic acid (VITAMIN C) 500 MG tablet Take by mouth.    [provider]  azelastine (ASTELIN) 0.1 % nasal spray Place 1 spray into both nostrils 2 (two) times daily. Use in each nostril as directed 10/14/20   Kozlow, Donnamarie Poag, MD  bisacodyl (DULCOLAX) 5 MG EC tablet Take 5 mg by mouth daily as needed for moderate constipation.    [provider]  CALCIUM PO Take by mouth.    [provider]  diltiazem (CARDIZEM CD) 180 MG 24 hr capsule TAKE 1 CAPSULE BY MOUTH EVERY DAY 04/11/20   Turner, Eber Hong, MD  ELIQUIS 5 MG TABS tablet TAKE 1 TABLET BY MOUTH TWICE A DAY 08/04/20   Fransico Him R, MD  EPINEPHrine 0.3 mg/0.3 mL IJ SOAJ injection Inject 0.3 mg into the muscle as needed  (for allergic reaction). Reported on 05/28/2015 10/14/20   Jiles Prows, MD  famotidine (PEPCID) 20 MG tablet Take 20 mg by mouth 2 (two) times daily.    [provider]  fluticasone (FLONASE) 50 MCG/ACT nasal spray Place 2 sprays into both nostrils 2 (two) times daily as needed for allergies.  06/06/13   [provider]  levothyroxine (SYNTHROID) 100 MCG tablet Take 100 mcg by mouth daily before breakfast.    [provider]  montelukast (SINGULAIR) 10 MG tablet Take 1 tablet (10 mg total) by mouth at bedtime. 10/14/20   Kozlow, Donnamarie Poag, MD  Olopatadine HCl 0.6 %  SOLN Place 2 sprays into both nostrils 2 (two) times daily. 10/14/20   Kozlow, Donnamarie Poag, MD  ondansetron (ZOFRAN ODT) 4 MG disintegrating tablet Take 1 tablet (4 mg total) by mouth every 6 (six) hours as needed. 05/11/20   Ward, Delice Bison, DO  ondansetron [facility]) 4 MG/5ML solution See admin instructions. 08/19/17   [provider]  Polyethyl Glycol-Propyl Glycol (SYSTANE) 0.4-0.3 % SOLN Apply 2 drops to eye daily as needed. 09/16/20   Kozlow, Donnamarie Poag, MD  Polyethylene Glycol 3350 (MIRALAX PO) Take 17 g by mouth daily as needed (constipation).     [provider]  PROAIR HFA [phone] Base) MCG/ACT inhaler Inhale 1-2 puffs into the lungs every 6 (six) hours as needed for wheezing. 10/14/20   Kozlow, Donnamarie Poag, MD  Probiotic Product (ALIGN) 4 MG CAPS Take 1 capsule by mouth daily.    [provider]  Propylene Glycol (SYSTANE BALANCE OP) Place 1 drop into both eyes daily as needed (dry eyes).    [provider]  psyllium (METAMUCIL) 58.6 % powder Take 1 packet by mouth daily as needed (constipation).    [provider]  RESTASIS 0.05 % ophthalmic emulsion Place 1 drop into both eyes 2 (two) times daily. Uses as directed 09/30/12   [provider]  rosuvastatin (CRESTOR) 10 MG tablet Take 5 mg by mouth at bedtime. 03/28/15   [provider]  VITAMIN D PO Take by mouth.     [provider]                                                                                                                                    Past Surgical History Past Surgical History:  Procedure Laterality Date  . ABDOMINAL HYSTERECTOMY    . ANAL FISSURE REPAIR    . BREAST EXCISIONAL BIOPSY Left   . BUNIONECTOMY  2014  . CATARACT EXTRACTION Bilateral 2013  . COLONOSCOPY     polyp removed  . ESOPHAGEAL DILATION    . EYE SURGERY Left   . FOOT SURGERY     left   . HEMORRHOID SURGERY    . MOUTH SURGERY  11/13/11   Cyst removed from gum-benign   . NASAL SEPTUM SURGERY    . NASAL SEPTUM SURGERY  1970's  . REPLACEMENT TOTAL KNEE Right 2011  . TONSILLECTOMY    . TOTAL ABDOMINAL HYSTERECTOMY W/ BILATERAL SALPINGOOPHORECTOMY  1991   TAH BSO  . TOTAL HIP ARTHROPLASTY     right   . TOTAL KNEE ARTHROPLASTY     both  . TOTAL KNEE ARTHROPLASTY Left 06/23/2015   Procedure: LEFT TOTAL KNEE ARTHROPLASTY;  Surgeon: Gaynelle Arabian, MD;  Location: [facility];  Service: Orthopedics;  Laterality: Left;   Family History Family History  Problem Relation Age of Onset  . Dementia Mother   . Irritable bowel syndrome Mother   . Alzheimer's disease Mother   .  Stroke Mother   . Hypertension Father   . Congestive Heart Failure Father   . Alcohol abuse Father   . Diabetes Sister   . Allergy (severe) Sister   . Colon cancer Paternal Aunt   . Heart disease Maternal Grandmother   . Heart attack Maternal Grandmother   . Heart attack Paternal Grandfather   . Heart attack Maternal Grandfather   . Arthritis Brother   . Colon polyps Brother   . Other Sister        pituitary disease  . Allergies Sister   . Other Sister        joint problems  . Breast cancer Sister 56  . Colon polyps Sister   . Esophageal cancer Neg Hx   . Rectal cancer Neg Hx   . Stomach cancer Neg Hx     Social History Social History   Tobacco Use  . Smoking status: Former Smoker    Packs/day: 0.25    Years:  15.00    Pack years: 3.75    Types: Cigarettes    Quit date: 06/01/1975    Years since quitting: 45.4  . Smokeless tobacco: Never Used  . Tobacco comment: Stopped smoking age 17  Vaping Use  . Vaping Use: Never used  Substance Use Topics  . Alcohol use: No    Alcohol/week: 0.0 standard drinks  . Drug use: No   Allergies Amoxicillin-pot clavulanate, Azithromycin, Erythromycin, Almond oil, Amoxicillin, Atorvastatin, Codeine, Ketoconazole, Ketoconazole, Latex, Lincomycin, Morphine, Morphine sulfate, Morphine sulfate, Other, Shellfish allergy, Simvastatin, Sulfamethoxazole-trimethoprim, Clindamycin, Clindamycin/lincomycin, and Erythromycin base  Review of Systems Review of Systems All other systems are reviewed and are negative for acute change except as noted in the HPI  Physical Exam Vital Signs  I have reviewed the triage vital signs BP (!) 131/101 (BP Location: Left Arm)   Pulse (!) 104   Temp (!) 97.5 F (36.4 C) (Oral)   Resp 16   Ht 5\' 7"  (1.702 m)   Wt 77.6 kg   LMP  (LMP Unknown)   SpO2 97%   BMI 26.78 kg/m   Physical Exam Vitals reviewed.  Constitutional:      General: She is not in acute distress.    Appearance: She is well-developed. She is not diaphoretic.  HENT:     Head: Normocephalic and atraumatic.     Nose: Nose normal.  Eyes:     General: No scleral icterus.       Right eye: No discharge.        Left eye: No discharge.     Conjunctiva/sclera: Conjunctivae normal.     Pupils: Pupils are equal, round, and reactive to light.  Cardiovascular:     Rate and Rhythm: Normal rate and regular rhythm.     Heart sounds: No murmur heard. No friction rub. No gallop.   Pulmonary:     Effort: Pulmonary effort is normal. No respiratory distress.     Breath sounds: Normal breath sounds. No stridor. No rales.  Abdominal:     General: There is no distension.     Palpations: Abdomen is soft.     Tenderness: There is no abdominal tenderness.  Musculoskeletal:         General: No tenderness.     Cervical back: Normal range of motion and neck supple.  Skin:    General: Skin is warm and dry.     Findings: No erythema or rash.  Neurological:     Mental Status: She is alert and  oriented to person, place, and time.     ED Results and Treatments Labs (all labs ordered are listed, but only abnormal results are displayed) Labs Reviewed  COMPREHENSIVE METABOLIC PANEL - Abnormal; Notable for the following components:      Result Value   Glucose, Bld 100 (*)    All other components within normal limits  URINALYSIS, DIPSTICK ONLY - Abnormal; Notable for the following components:   Color, Urine COLORLESS (*)    Specific Gravity, Urine 1.003 (*)    Hgb urine dipstick MODERATE (*)    Ketones, ur 5 (*)    All other components within normal limits  CBC WITH DIFFERENTIAL/PLATELET  TROPONIN I (HIGH SENSITIVITY)                                                                                                                         EKG  EKG Interpretation  Date/Time:  Wednesday November 05 2020 03:11:00 EDT Ventricular Rate:  56 PR Interval:  217 QRS Duration: 112 QT Interval:  433 QTC Calculation: 418 R Axis:   -36 Text Interpretation: Sinus rhythm Atrial premature complex Borderline prolonged PR interval Incomplete RBBB and LAFB Abnormal R-wave progression, late transition Confirmed by Addison Lank [id number]) on 11/05/2020 3:14:07 AM      Radiology DG Chest 2 View  Result Date: 11/05/2020 CLINICAL DATA:  Chest pain, dyspnea EXAM: CHEST - 2 VIEW COMPARISON:  05/11/2020 FINDINGS: The lungs are symmetrically well expanded. Minimal left basilar atelectasis or infiltrate. The lungs are otherwise clear. No pneumothorax or pleural effusion. Cardiac size within normal limits. The pulmonary vascularity is normal. No acute bone abnormality. IMPRESSION: Mild left basilar atelectasis or infiltrate. Electronically Signed   By: Fidela Salisbury MD   On: 11/05/2020 03:45     Pertinent labs & imaging results that were available during my care of the patient were reviewed by me and considered in my medical decision making (see chart for details).  Medications Ordered in ED Medications  sodium chloride 0.9 % bolus 500 mL (500 mLs Intravenous New Bag/Given 11/05/20 0402)                                                                                                                                    Procedures .1-3 Lead EKG Interpretation Performed by: Fatima Blank, MD Authorized by: Fatima Blank, MD     Interpretation: normal  ECG rate:  62   ECG rate assessment: normal     Rhythm: sinus rhythm     Ectopy: none     Conduction: normal      (including critical care time)  Medical Decision Making / ED Course I have reviewed the nursing notes for this encounter and the patient's prior records (if available in EHR or on provided paperwork).   Kimberly [name] was evaluated in Emergency Department on 11/05/2020 for the symptoms described in the history of present illness. She was evaluated in the context of the global COVID-19 pandemic, which necessitated consideration that the patient might be at risk for infection with the SARS-CoV-2 virus that causes COVID-19. Institutional protocols and algorithms that pertain to the evaluation of patients at risk for COVID-19 are in a state of rapid change based on information released by regulatory bodies including the CDC and federal and state organizations. These policies and algorithms were followed during the patient's care in the ED.  Patient noted to be in A. fib RVR upon arrival with rates in the 130s. Patient spontaneously converted back to normal sinus rhythm while being transported back to her room. Labs grossly reassuring without leukocytosis or anemia. No significant electrolyte derangements or renal insufficiency. Troponin negative.  Given the timeframe, do not believe that patient's  chest pain was related to ACS. Chest x-ray without evidence suggestive of pneumonia, pneumothorax, pneumomediastinum.  No abnormal contour of the mediastinum to suggest dissection. No evidence of acute injuries.   Patient monitored for additional 2 hrs w/o recurrence of A Fib.      Final Clinical Impression(s) / ED Diagnoses Final diagnoses:  Atrial fibrillation with RVR ([place])    The patient appears reasonably screened and/or stabilized for discharge and I doubt any other medical condition or other [facility] requiring further screening, evaluation, or treatment in the ED at this time prior to discharge. Safe for discharge with strict return precautions.  Disposition: Discharge  Condition: Good  I have discussed the results, Dx and Tx plan with the patient/family who expressed understanding and agree(s) with the plan. Discharge instructions discussed at length. The patient/family was given strict return precautions who verbalized understanding of the instructions. No further questions at time of discharge.    ED Discharge Orders    None       Follow Up: Sueanne Margarita, MD [address] [place] [address] [phone]  Call  to schedule an appointment for close follow up  Lajean Manes, [place] [address] [place] [address] [phone]  Call  as needed     This chart was dictated using voice recognition software.  Despite best efforts to proofread,  errors can occur which can change the documentation meaning.   Fatima Blank, MD 11/05/20 [phone]

## 2020-11-05 NOTE — Telephone Encounter (Signed)
Patient is following up regarding cystoscopy with Dr. Zigmund Daniel' office. She states she contacted their office as advised and they sent a clearance request to the office. She would like to know if it has been received.

## 2020-11-05 NOTE — ED Provider Notes (Signed)
Emergency Medicine Provider Triage Evaluation Note  Kimberly [name] , a 80 y.o. female  was evaluated in triage.  Pt complains of chest pain and shortness of breath onset around 1 AM when she went to go to the bathroom.  Patient reports earlier in the day she had some chest discomfort but no specific pain.  At that time she does not believe she was in A. fib as she is usually able to tell.  She takes Eliquis 2 times per day and reports no missed doses.  She additionally takes diazepam and has had not had any missed doses of this either.  Reports some increased stress over the last few days and pushing herself to be slightly more active.  No syncope or near syncope.  Patient reports some additional right-sided knee pain this evening which has resolved.  No leg swelling or calf tenderness.  Review of Systems  Positive: Chest pain, shortness of breath, palpitations Negative: Nausea, vomiting, syncope  Physical Exam  Ht 5\' 7"  (1.702 m)   Wt 77.6 kg   LMP  (LMP Unknown)   BMI 26.78 kg/m  Gen:   Awake, no distress   Resp:  Normal effort, clear and equal breath sounds MSK:   Moves extremities without difficulty, no peripheral edema Other:  Irregularly irregular rhythm, tachycardia  Medical Decision Making  Medically screening exam initiated at 2:02 AM.  Appropriate orders placed.  [name] was informed that the remainder of the evaluation will be completed by another provider, this initial triage assessment does not replace that evaluation, and the importance of remaining in the ED until their evaluation is complete.  A. fib with RVR.  Mild chest pain or shortness of breath.  EKG, cardiac labs, initiated.     Cheng Dec, Gwenlyn Perking 11/05/20 Antony Madura    Ripley Fraise, MD 11/05/20 [phone]

## 2020-11-06 NOTE — Telephone Encounter (Signed)
I s/w RN at Dr. Blossom Hoops office with [facility]. I was informed per Dr. Derrel Nip; pt will not need to hold Eliquis for procedure cystoscopy and renal u/s, as well as no need for cardiac clearance. I thanked Therapist, sports for all of her help in this matter. I will fax notes to Dr. Zigmund Daniel office as Juluis Rainier.   Fax# [phone]

## 2020-11-07 DIAGNOSIS — D6869 Other thrombophilia: Secondary | ICD-10-CM | POA: Diagnosis not present

## 2020-11-07 DIAGNOSIS — I480 Paroxysmal atrial fibrillation: Secondary | ICD-10-CM | POA: Diagnosis not present

## 2020-11-10 ENCOUNTER — Ambulatory Visit: Payer: Medicare PPO | Admitting: Podiatry

## 2020-11-10 ENCOUNTER — Encounter: Payer: Self-pay | Admitting: Podiatry

## 2020-11-10 ENCOUNTER — Other Ambulatory Visit: Payer: Self-pay

## 2020-11-10 DIAGNOSIS — M810 Age-related osteoporosis without current pathological fracture: Secondary | ICD-10-CM | POA: Insufficient documentation

## 2020-11-10 DIAGNOSIS — L84 Corns and callosities: Secondary | ICD-10-CM | POA: Diagnosis not present

## 2020-11-10 DIAGNOSIS — M4316 Spondylolisthesis, lumbar region: Secondary | ICD-10-CM | POA: Insufficient documentation

## 2020-11-10 DIAGNOSIS — F411 Generalized anxiety disorder: Secondary | ICD-10-CM | POA: Insufficient documentation

## 2020-11-10 DIAGNOSIS — D6859 Other primary thrombophilia: Secondary | ICD-10-CM | POA: Insufficient documentation

## 2020-11-10 DIAGNOSIS — K200 Eosinophilic esophagitis: Secondary | ICD-10-CM | POA: Insufficient documentation

## 2020-11-10 DIAGNOSIS — Z7901 Long term (current) use of anticoagulants: Secondary | ICD-10-CM | POA: Diagnosis not present

## 2020-11-10 DIAGNOSIS — I700 Atherosclerosis of aorta: Secondary | ICD-10-CM | POA: Insufficient documentation

## 2020-11-10 DIAGNOSIS — G8929 Other chronic pain: Secondary | ICD-10-CM | POA: Insufficient documentation

## 2020-11-10 NOTE — Progress Notes (Signed)
This patient presents to the office with severe pain caused by callus on the inside of her right big toe.  She says Dr.  Adah Perl has been working on her toe.  She presents to the office and there is severe pain.  She says she has tried multiple padding in an effort to reduce pain but pain persists.  Patient says she had surgery on her left foot in [facility] which did improve the bunion.She presents to the office for evaluation and treatment.  Vascular  Dorsalis pedis and posterior tibial pulses are palpable  B/L.  Capillary return  WNL.  Temperature gradient is  WNL.  Skin turgor  WNL  Sensorium  Senn Weinstein monofilament wire  WNL. Normal tactile sensation.  Nail Exam  Patient has normal nails with no evidence of bacterial or fungal infection.  Orthopedic  Exam  Muscle tone and muscle strength  WNL.  No limitations of motion feet  B/L.  No crepitus or joint effusion noted.  Foot type is unremarkable and digits show no abnormalities.  HAV B/L.  Hammer toes 2-4  B/L.  Skin  No open lesions.  Normal skin texture and turgor. Corn on the lateral aspect right hallux at level of IPJ right hallux.  Corn right hallux.  ROV.  Debride corn right hallux.  Discussed additional therapies with this patient.  Prescribed lambs wool to separate 1/2 right.  Padding dispensed.  Discussed cortisone injection which she was reluctant.  RTC 3 months.   Gardiner Barefoot DPM

## 2020-11-11 DIAGNOSIS — G4733 Obstructive sleep apnea (adult) (pediatric): Secondary | ICD-10-CM | POA: Diagnosis not present

## 2020-11-12 ENCOUNTER — Ambulatory Visit: Payer: Medicare PPO | Admitting: Internal Medicine

## 2020-11-12 DIAGNOSIS — G4733 Obstructive sleep apnea (adult) (pediatric): Secondary | ICD-10-CM | POA: Diagnosis not present

## 2020-11-18 ENCOUNTER — Ambulatory Visit (HOSPITAL_COMMUNITY): Payer: Medicare PPO | Admitting: Nurse Practitioner

## 2020-11-20 ENCOUNTER — Other Ambulatory Visit: Payer: Self-pay

## 2020-11-20 ENCOUNTER — Ambulatory Visit (HOSPITAL_COMMUNITY)
Admission: RE | Admit: 2020-11-20 | Discharge: 2020-11-20 | Disposition: A | Discharge status: Home / Self Care | Payer: Medicare PPO | Source: Ambulatory Visit | Attending: Nurse Practitioner | Admitting: Nurse Practitioner

## 2020-11-20 ENCOUNTER — Encounter (HOSPITAL_COMMUNITY): Payer: Self-pay | Admitting: Nurse Practitioner

## 2020-11-20 VITALS — BP 170/86 | HR 54 | Ht 67.0 in | Wt 173.4 lb

## 2020-11-20 DIAGNOSIS — J45909 Unspecified asthma, uncomplicated: Secondary | ICD-10-CM | POA: Diagnosis not present

## 2020-11-20 DIAGNOSIS — I071 Rheumatic tricuspid insufficiency: Secondary | ICD-10-CM | POA: Insufficient documentation

## 2020-11-20 DIAGNOSIS — Z7901 Long term (current) use of anticoagulants: Secondary | ICD-10-CM | POA: Insufficient documentation

## 2020-11-20 DIAGNOSIS — Z79899 Other long term (current) drug therapy: Secondary | ICD-10-CM | POA: Diagnosis not present

## 2020-11-20 DIAGNOSIS — F419 Anxiety disorder, unspecified: Secondary | ICD-10-CM | POA: Insufficient documentation

## 2020-11-20 DIAGNOSIS — I4430 Unspecified atrioventricular block: Secondary | ICD-10-CM | POA: Insufficient documentation

## 2020-11-20 DIAGNOSIS — I480 Paroxysmal atrial fibrillation: Secondary | ICD-10-CM | POA: Diagnosis not present

## 2020-11-20 DIAGNOSIS — G4733 Obstructive sleep apnea (adult) (pediatric): Secondary | ICD-10-CM | POA: Insufficient documentation

## 2020-11-20 DIAGNOSIS — Z87891 Personal history of nicotine dependence: Secondary | ICD-10-CM | POA: Insufficient documentation

## 2020-11-20 DIAGNOSIS — D6869 Other thrombophilia: Secondary | ICD-10-CM | POA: Diagnosis not present

## 2020-11-20 NOTE — Progress Notes (Signed)
Primary Care Physician: Lajean Manes, MD Referring Physician: Dr. Boykin Nearing is a 80 y.o. female with a h/o moderate OSA on CPAP, asthma, and depression normal stress Myoview in 2014.  Patient was in the ER 06/23/17 with syncope.  She got up to go to the bathroom at 3 AM and lost consciousness for about 30 seconds.  She then had chest pain afterwards that resolved with nitroglycerin and aspirin.  Troponins were negative x2 and CT of the head was unremarkable. Wasn't orthostatic. Prior to this she has had worsening GERD.  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  She had a similar episode 04/2016 that was felt to be vasovagal.  Echo at that time was normal.  Palpitations were related to caffeine intake at that time.  Dr. Radford Pax ordered an event monitor which was placed 1/28.  Pt is being seen in the afib cliinic today,2/18,  for events noted on the monitor that show afib with RVR at 120 bpm. She is in SR today. She is under a lot of stress 2/2 her husbands dx of cancer. She does not drink alcohol, no tobacco or excessive caffeine. Pt was started on metoprolol 25 mg 1/2 tab bid and eliquis 5 mg bid for chadsvasc score of 5.  F/u in afib clinic 2/28. She had one episode of afib that she reported to the ER for v rate around 130 bpm, converted quickly to IV BB. BB was increased to 25 mg bid. She is in afib clinic in Sinus brady in the 50's, not symptomatic. Discussed when it is appropriate to go to ER and how to treat at home. Pt has a lot of anxiety which contributes to afib management. She turned in her monitor yesterday.  F/u in the afib clinic, 11/20/20. I have not  seen pt in several years. She had a ER visit 6/8 for tachycardia with chest pressure. She was in afib with RVR, spontaneously converted to SR.  Troponin's were negative. She remains in SR today. She is on eliquis 5 mg bid for a CHA2DS2VASc  score of 3. SHe has had a very blow Afib burden over the years. She  feels the stress of her husbands health may have been the trigger.  [facility] She [facility] yrs ago before covid.    Today, she denies symptoms of palpitations, chest pain, shortness of breath, orthopnea, PND, lower extremity edema, dizziness, presyncope, syncope, or neurologic sequela. The patient is tolerating medications without difficulties and is otherwise without complaint today.   Past Medical History:  Diagnosis Date   Allergy    Anal polyp 1998   Flex Sig    Anemia    Angular blepharitis of left eye    Ankle fracture    Stress fracture   Anxiety    Asthma    border line has inhaler   Bunion    Cataracts, bilateral    Corneal scar    left eye   CPAP (continuous positive airway pressure) dependence    Degenerative disc disease    Depression    Diverticulosis of colon (without mention of hemorrhage) 2010   Colonoscopy   Dry eyes    bilateral   Enterocele    External hemorrhoids 2000   Colonoscopy   Family history of malignant neoplasm of gastrointestinal tract    Female cystocele    Fibromyalgia    GERD (gastroesophageal reflux  disease)    Hiatal hernia 2005,2010   EGD   History of bronchitis    History of measles    History of mumps    History of strep sore throat    History of urinary tract infection    Hyperlipemia    Hypothyroidism    IBS (irritable bowel syndrome)    Imbalance    Internal hemorrhoids without mention of complication 0865,7846   Colonoscopy    Itching    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HCC)    Menopause    Migraine    OSA (obstructive sleep apnea) 05/14/2015   on BiPAP   Osteopenia 10/2013   T score -1.6 FRAX 10%/1.6%   PAF (paroxysmal atrial fibrillation) (HCC)    Pancreatitis    PCO (posterior capsular opacification)    left   Pneumonia    childhood illness   PONV (postoperative nausea and vomiting)    Pseudophakia, both eyes    PVD (posterior vitreous detachment) right   Rash     on back    Retinal scar    left   Rotator cuff disorder    pain, left shoulder   Shingles    Stricture and stenosis of esophagus 2005,2010   EGD    Stroke ([place])    Ulcerative colitis ([place])    Varicose veins    Vertigo    Wears glasses    Past Surgical History:  Procedure Laterality Date   ABDOMINAL HYSTERECTOMY     ANAL FISSURE REPAIR     BREAST EXCISIONAL BIOPSY Left    BUNIONECTOMY  2014   CATARACT EXTRACTION Bilateral 2013   COLONOSCOPY     polyp removed   ESOPHAGEAL DILATION     EYE SURGERY Left    FOOT SURGERY     left    HEMORRHOID SURGERY     MOUTH SURGERY  11/13/11   Cyst removed from [place]  1970's   REPLACEMENT TOTAL KNEE Right 2011   TONSILLECTOMY     TOTAL ABDOMINAL HYSTERECTOMY W/ BILATERAL SALPINGOOPHORECTOMY  1991   TAH BSO   TOTAL HIP ARTHROPLASTY     right    TOTAL KNEE ARTHROPLASTY     both   TOTAL KNEE ARTHROPLASTY Left 06/23/2015   Procedure: LEFT TOTAL KNEE ARTHROPLASTY;  Surgeon: Gaynelle Arabian, MD;  Location: [facility];  Service: Orthopedics;  Laterality: Left;    Current Outpatient Medications  Medication Sig Dispense Refill   acetaminophen (TYLENOL) 500 MG tablet Take 500 mg by mouth every 6 (six) hours as needed for moderate pain.      albuterol (VENTOLIN HFA) 108 (90 Base) MCG/ACT inhaler Inhale 2 puffs into the lungs every 6 (six) hours as needed for wheezing or shortness of breath. 18 g 3   azelastine (ASTELIN) 0.1 % nasal spray Place 1 spray into both nostrils 2 (two) times daily. Use in each nostril as directed 30 mL 5   bisacodyl (DULCOLAX) 5 MG EC tablet Take 5 mg by mouth daily as needed for moderate constipation.     CALCIUM PO Taking 2 chewables by mouth daily- 500mg      Cholecalciferol (VITAMIN D3) 50 MCG (2000 UT) TABS Take 1 tablet by mouth daily.     cycloSPORINE (RESTASIS) 0.05 % ophthalmic emulsion Place 1 drop into both eyes 2 (two) times daily.     diltiazem (CARDIZEM CD)  180 MG 24 hr capsule TAKE 1 CAPSULE BY MOUTH EVERY DAY  90 capsule 3   ELIQUIS 5 MG TABS tablet TAKE 1 TABLET BY MOUTH TWICE A DAY 180 tablet 2   EPINEPHrine 0.3 mg/0.3 mL IJ SOAJ injection Inject 0.3 mg into the muscle as needed (for allergic reaction). Reported on 05/28/2015 2 each 2   famotidine (PEPCID) 20 MG tablet Take 20 mg by mouth 2 (two) times daily. OTC medication     fluticasone (FLONASE) 50 MCG/ACT nasal spray Place 2 sprays into both nostrils 2 (two) times daily as needed for allergies.      hydrocortisone (ANUSOL-HC) 25 MG suppository Place rectally.     linaclotide (LINZESS) 145 MCG CAPS capsule Take by mouth.     ondansetron (ZOFRAN ODT) 4 MG disintegrating tablet Take 1 tablet (4 mg total) by mouth every 6 (six) hours as needed. 20 tablet 0   Polyethyl Glycol-Propyl Glycol (SYSTANE) 0.4-0.3 % SOLN Apply 2 drops to eye daily as needed. 30 mL 5   Polyethylene Glycol 3350 (MIRALAX PO) Take 17 g by mouth daily as needed (constipation).      PROAIR HFA 108 (90 Base) MCG/ACT inhaler Inhale 1-2 puffs into the lungs every 6 (six) hours as needed for wheezing. 18 g 3   Probiotic Product (ALIGN) 4 MG CAPS Take 1 capsule by mouth daily.     psyllium (METAMUCIL) 58.6 % powder Take 1 packet by mouth daily as needed (constipation).     rosuvastatin (CRESTOR) 10 MG tablet Take 5 mg by mouth at bedtime.  4   traMADol (ULTRAM) 50 MG tablet Take 50 mg by mouth every 6 (six) hours as needed.     levothyroxine (SYNTHROID) 100 MCG tablet Take 100 mcg by mouth daily before breakfast.     montelukast (SINGULAIR) 10 MG tablet Take 1 tablet (10 mg total) by mouth at bedtime. (Patient taking differently: Take 10 mg by mouth daily with breakfast.) 30 tablet 5   No current facility-administered medications for this encounter.    Allergies  Allergen Reactions   Amoxicillin-Pot Clavulanate Nausea Only and Swelling    Has patient had a PCN reaction causing immediate rash, facial/tongue/throat swelling, SOB  or lightheadedness with hypotension: No Has patient had a PCN reaction causing severe rash involving mucus membranes or skin necrosis: No Has patient had a PCN reaction that required hospitalization No Has patient had a PCN reaction occurring within the last 10 years: No If all of the above answers are "NO", then may proceed with Cephalosporin use.  Other reaction(s): nausea   Azithromycin Other (See Comments)    Pancreatitis Other reaction(s): Other, pancreatitis   Erythromycin Hives    Reaction 1 time, when she was younger. Reaction 1 time, when she was younger.  Other reaction(s): hives   Almond Oil Other (See Comments)    Almonds: per allergy testing.   Amoxicillin Nausea Only   Atorvastatin Other (See Comments)    Muscle weakness  Muscle weakness  Other reaction(s): muscle weakness, Other   Clavulanic Acid Nausea Only   Codeine Nausea Only    Other reaction(s): Nausea/Vomiting   Ketoconazole Nausea And Vomiting   Ketoconazole Nausea Only and Nausea And Vomiting    Other reaction(s): Unknown   Latex Other (See Comments)    Other   Lincomycin     Stomach upset    Morphine Nausea Only    Other reaction(s): Unknown   Morphine Sulfate Nausea Only and Other (See Comments)      Unknown   Morphine Sulfate     Other reaction(s):  Unknown   Other Other (See Comments)    Shellfish mix: per allergy testing. Other reaction(s): nausea   Shellfish Allergy Other (See Comments)    Shellfish mix: per allergy testing.   Simvastatin Other (See Comments)    Muscle pain Other reaction(s): muscle pain, Other   Sulfamethoxazole-Trimethoprim Nausea Only    Upset stomach    Clindamycin Rash   Clindamycin/Lincomycin Nausea And Vomiting and Rash   Erythromycin Base Rash    Social History   Socioeconomic History   Marital status: Married    Spouse name: Not on file   Number of children: 1   Years of education: bus.school   Highest education level: Not on file  Occupational  History   Occupation: Retired    Fish farm manager: RETIRED  Tobacco Use   Smoking status: Former    Packs/day: 0.25    Years: 15.00    Pack years: 3.75    Types: Cigarettes    Quit date: 06/01/1975    Years since quitting: 45.5   Smokeless tobacco: Never   Tobacco comments:    Stopped smoking age 79  Vaping Use   Vaping Use: Never used  Substance and Sexual Activity   Alcohol use: No    Alcohol/week: 0.0 standard drinks   Drug use: No   Sexual activity: Never    Birth control/protection: Surgical    Comment: HYST, intercourse age unknown, sexual partner less than 5  Other Topics Concern   Not on file  Social History Narrative   Daily caffeine    Social Determinants of Health   Financial Resource Strain: Not on file  Food Insecurity: Not on file  Transportation Needs: Not on file  Physical Activity: Not on file  Stress: Not on file  Social Connections: Not on file  Intimate Partner Violence: Not on file    Family History  Problem Relation Age of Onset   Dementia Mother    Irritable bowel syndrome Mother    Alzheimer's disease Mother    Stroke Mother    Hypertension Father    Congestive Heart Failure Father    Alcohol abuse Father    Diabetes Sister    Allergy (severe) Sister    Colon cancer Paternal Aunt    Heart disease Maternal Grandmother    Heart attack Maternal Grandmother    Heart attack Paternal Grandfather    Heart attack Maternal Grandfather    Arthritis Brother    Colon polyps Brother    Other Sister        pituitary disease   Allergies Sister    Other Sister        joint problems   Breast cancer Sister 76   Colon polyps Sister    Esophageal cancer Neg Hx    Rectal cancer Neg Hx    Stomach cancer Neg Hx     ROS- All systems are reviewed and negative except as per the HPI above  Physical Exam: Vitals:   11/20/20 1405  Weight: 78.7 kg  Height: 5\' 7"  (1.702 m)   Wt Readings from Last 3 Encounters:  11/20/20 78.7 kg  11/05/20 77.6 kg   10/14/20 77.1 kg    Labs: Lab Results  Component Value Date   NA 138 11/05/2020   K 3.6 11/05/2020   CL 104 11/05/2020   CO2 25 11/05/2020   GLUCOSE 100 (H) 11/05/2020   BUN 16 11/05/2020   CREATININE 0.80 11/05/2020   CALCIUM 9.5 11/05/2020   Lab Results  Component Value Date  INR 1.08 06/17/2015   Lab Results  Component Value Date   CHOL 124 07/23/2013   HDL 68 07/23/2013   LDLCALC 42 07/23/2013   TRIG 69 07/23/2013     GEN- The patient is well appearing, alert and oriented x 3 today.   Head- normocephalic, atraumatic Eyes-  Sclera clear, conjunctiva pink Ears- hearing intact Oropharynx- clear Neck- supple, no JVP Lymph- no cervical lymphadenopathy Lungs- Clear to ausculation bilaterally, normal work of breathing Heart- Regular rate and rhythm, no murmurs, rubs or gallops, PMI not laterally displaced GI- soft, NT, ND, + BS Extremities- no clubbing, cyanosis, or edema MS- no significant deformity or atrophy Skin- no rash or lesion Psych- euthymic mood, full affect Neuro- strength and sensation are intact  EKG- Sinus brady at 51 bpm, RBBB, pr int 204 ms, qrs int 138 ms, qtc 422 ms Event monitor strips reviewed, showed Afib at 120 bpm  Echo-Study Conclusions   - Left ventricle: The cavity size was normal. Wall thickness was   normal. Systolic function was normal. The estimated ejection   fraction was in the range of 60% to 65%. Wall motion was normal;   there were no regional wall motion abnormalities. Doppler   parameters are consistent with pseudonormal left ventricular   relaxation (grade 2 diastolic dysfunction). The E/A ratio is   >1.5. The E/e&' ratio is >15, suggesting elevated LV filling   pressure. - Mitral valve: Mildly thickened leaflets . There was trivial   regurgitation. - Left atrium: The atrium was normal in size. - Tricuspid valve: There was mild regurgitation. - Pulmonary arteries: PA peak pressure: 23 mm Hg (S). - Inferior vena cava:  The vessel was normal in size. The   respirophasic diameter changes were in the normal range (>= 50%),   consistent with normal central venous pressure.   Impressions:   - LVEF 60-65%, normal wall thickness, normal wall motion, grade 2   DD with elevated LV Filling pressure, trivial MR, mild TR, RVSP   23 mmHg, normal IVC.   Stress test 2/5-lexi Myoview-Study Highlights     Nuclear stress EF: 68%. There was no ST segment deviation noted during stress. The study is normal. This is a low risk study. The left ventricular ejection fraction is hyperdynamic (>65%).   Normal pharmacologic nuclear stress test with no evidence for prior infarct or ischemia. Normal LVEF.       Assessment and Plan: 1.paroxysmal  afib Low burden  General education re afib reviewed Discussed how to manage at home and when it is appropriate to be seen in the ER Continue Cardizem 180 mg daily  Continue use of cpap  2. Chadsvasc score of at least 5 Continue with  eliquis 5 mg bid   3. Lifestyle issues  Pt states that she has had mild weight gain and would [facility]    F/u with Dr. Radford Pax as scheduled  Afib clinic needed    Geroge Baseman. Daemon Dowty, [facility] [address] [place], [place] [address] [phone]

## 2020-11-24 ENCOUNTER — Telehealth: Payer: Self-pay

## 2020-11-24 NOTE — Telephone Encounter (Signed)
Called patient to discuss re-starting PREP program. She shared that she was interested, but has just learned that she has to have a medical procedure that will delay starting for another 1-2 months. Will continue to follow/monitor and call back with upcoming programs in August, she will call back before then if she is medically cleared from procedure and can begin sooner.

## 2020-11-26 ENCOUNTER — Ambulatory Visit: Payer: Medicare PPO | Admitting: Obstetrics and Gynecology

## 2020-11-26 DIAGNOSIS — R3121 Asymptomatic microscopic hematuria: Secondary | ICD-10-CM | POA: Diagnosis not present

## 2020-11-26 DIAGNOSIS — R3129 Other microscopic hematuria: Secondary | ICD-10-CM | POA: Insufficient documentation

## 2020-11-26 DIAGNOSIS — N8189 Other female genital prolapse: Secondary | ICD-10-CM | POA: Diagnosis not present

## 2020-12-03 DIAGNOSIS — H524 Presbyopia: Secondary | ICD-10-CM | POA: Diagnosis not present

## 2020-12-03 DIAGNOSIS — H26492 Other secondary cataract, left eye: Secondary | ICD-10-CM | POA: Diagnosis not present

## 2020-12-03 DIAGNOSIS — H1789 Other corneal scars and opacities: Secondary | ICD-10-CM | POA: Diagnosis not present

## 2020-12-03 DIAGNOSIS — H04123 Dry eye syndrome of bilateral lacrimal glands: Secondary | ICD-10-CM | POA: Diagnosis not present

## 2020-12-04 ENCOUNTER — Encounter: Payer: Self-pay | Admitting: Cardiology

## 2020-12-04 ENCOUNTER — Ambulatory Visit: Payer: Medicare PPO | Admitting: Cardiology

## 2020-12-04 ENCOUNTER — Other Ambulatory Visit: Payer: Self-pay

## 2020-12-04 VITALS — BP 136/80 | HR 74 | Ht 67.5 in | Wt 174.8 lb

## 2020-12-04 DIAGNOSIS — G4733 Obstructive sleep apnea (adult) (pediatric): Secondary | ICD-10-CM | POA: Diagnosis not present

## 2020-12-04 DIAGNOSIS — I480 Paroxysmal atrial fibrillation: Secondary | ICD-10-CM

## 2020-12-04 MED ORDER — APIXABAN 5 MG PO TABS
5.0000 mg | ORAL_TABLET | Freq: Two times a day (BID) | ORAL | 2 refills | Status: DC
Start: 2020-12-04 — End: 2021-04-30

## 2020-12-04 MED ORDER — DILTIAZEM HCL ER COATED BEADS 240 MG PO CP24: 240.0000 mg | ORAL_CAPSULE | Freq: Every day | ORAL | 3 refills | Status: DC

## 2020-12-04 NOTE — Progress Notes (Signed)
Cardiology Office Note:    Date:  12/04/2020   ID:  Kimberly [name], DOB 1941/04/15, MRN [id number]  PCP:  Lajean Manes, MD  Cardiologist:  Fransico Him, MD    Referring MD: Lajean Manes, MD   Chief Complaint  Patient presents with   Atrial Fibrillation   Sleep Apnea     History of Present Illness:    Kimberly [name] is a 80 y.o. female with a hx of PAF on metoprolol and Eliquis for chads vas score of 5.  Metoprolol decreased by A. fib clinic because of bradycardia.  She also has a history of chest pain with normal nuclear stress test and syncope felt secondary to dehydration.  She has a chronic RBBB with no ischemia on stress test a year ago. Due to chest pain a coronary CTA was done and showed  a calcium score of 0 and no evidence of CAD.    She presented to the ER on 8/30 with recurrent CP that occurred as a band around under her breasts that was 5/10 without radiation and associated with pressure.  EMS was called and noted her HR to be in the 200's in afib.  She received NTG with improvement in pain.  Pain lasted 30 minutes but was not associated with any other sx. Initial trop in ER was normal at 11 but trended up to 135.  2D echo showed normal LVF with no PE.  It was felt that she may have GERD and had gone off PPI.  She was placed back on her PPI.    She was recently seen in [place] [facility] after an ER visit on 6/8 for afib with RVR and spontaneously converted to NSR and no changes were made.  Since then she has had another episode where she thought she was going to go back into afib.   She has moderate OSA with an AHI of 16/hr on HST.  She is doing well with her CPAP device and thinks that she has gotten used to it.  She tolerates the mask and feels the pressure is adequate.  Since going on CPAP she feels rested in the am and has no significant daytime sleepiness.  She denies any significant mouth or nasal dryness or nasal congestion.  She does not think that he snores.      She denies any chest pain or pressure, SOB, DOE, PND, orthopnea, LE edema, dizziness or syncope. She is compliant with her meds and is tolerating meds with no SE.     Past Medical History:  Diagnosis Date   Allergy    Anal polyp 1998   Flex Sig    Anemia    Angular blepharitis of left eye    Ankle fracture    Stress fracture   Anxiety    Asthma    border line has inhaler   Bunion    Cataracts, bilateral    Corneal scar    left eye   CPAP (continuous positive airway pressure) dependence    Degenerative disc disease    Depression    Diverticulosis of colon (without mention of hemorrhage) 2010   Colonoscopy   Dry eyes    bilateral   Enterocele    External hemorrhoids 2000   Colonoscopy   Family history of malignant neoplasm of gastrointestinal tract    Female cystocele    Fibromyalgia    GERD (gastroesophageal reflux disease)    Hiatal hernia 8937,3428   EGD   History of bronchitis  History of measles    History of mumps    History of strep sore throat    History of urinary tract infection    Hyperlipemia    Hypothyroidism    IBS (irritable bowel syndrome)    Imbalance    Internal hemorrhoids without mention of complication 0160,1093   Colonoscopy    Itching    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HCC)    Menopause    Migraine    OSA (obstructive sleep apnea) 05/14/2015   on BiPAP   Osteopenia 10/2013   T score -1.6 FRAX 10%/1.6%   PAF (paroxysmal atrial fibrillation) (HCC)    Pancreatitis    PCO (posterior capsular opacification)    left   Pneumonia    childhood illness   PONV (postoperative nausea and vomiting)    Pseudophakia, both eyes    PVD (posterior vitreous detachment) right   Rash    on back    Retinal scar    left   Rotator cuff disorder    pain, left shoulder   Shingles    Stricture and stenosis of esophagus 2005,2010   EGD    Stroke ([place])    Ulcerative colitis ([place])    Varicose veins    Vertigo    Wears glasses     Past Surgical History:   Procedure Laterality Date   ABDOMINAL HYSTERECTOMY     ANAL FISSURE REPAIR     BREAST EXCISIONAL BIOPSY Left    BUNIONECTOMY  2014   CATARACT EXTRACTION Bilateral 2013   COLONOSCOPY     polyp removed   ESOPHAGEAL DILATION     EYE SURGERY Left    FOOT SURGERY     left    HEMORRHOID SURGERY     MOUTH SURGERY  11/13/11   Cyst removed from [place]  1970's   REPLACEMENT TOTAL KNEE Right 2011   TONSILLECTOMY     TOTAL ABDOMINAL HYSTERECTOMY W/ BILATERAL SALPINGOOPHORECTOMY  1991   TAH BSO   TOTAL HIP ARTHROPLASTY     right    TOTAL KNEE ARTHROPLASTY     both   TOTAL KNEE ARTHROPLASTY Left 06/23/2015   Procedure: LEFT TOTAL KNEE ARTHROPLASTY;  Surgeon: Gaynelle Arabian, MD;  Location: [facility];  Service: Orthopedics;  Laterality: Left;    Current Medications: Current Meds  Medication Sig   acetaminophen (TYLENOL) 500 MG tablet Take 500 mg by mouth every 6 (six) hours as needed for moderate pain.    albuterol (VENTOLIN HFA) 108 (90 Base) MCG/ACT inhaler Inhale 2 puffs into the lungs every 6 (six) hours as needed for wheezing or shortness of breath.   azelastine (ASTELIN) 0.1 % nasal spray Place 1 spray into both nostrils 2 (two) times daily. Use in each nostril as directed   bisacodyl (DULCOLAX) 5 MG EC tablet Take 5 mg by mouth daily as needed for moderate constipation.   CALCIUM PO Taking 2 chewables by mouth daily- 500mg    Cholecalciferol 100 MCG (4000 UT) TABS Take by mouth.   cycloSPORINE (RESTASIS) 0.05 % ophthalmic emulsion Place 1 drop into both eyes 2 (two) times daily.   diltiazem (CARDIZEM CD) 180 MG 24 hr capsule TAKE 1 CAPSULE BY MOUTH EVERY DAY   ELIQUIS 5 MG TABS tablet TAKE 1 TABLET BY MOUTH TWICE A DAY   EPINEPHrine 0.3 mg/0.3 mL IJ SOAJ injection Inject 0.3 mg into the muscle as needed (for allergic reaction). Reported on 05/28/2015  famotidine (PEPCID) 20 MG tablet Take 20 mg by mouth 2 (two) times daily. OTC  medication   fluticasone (FLONASE) 50 MCG/ACT nasal spray Place 2 sprays into both nostrils 2 (two) times daily as needed for allergies.    levothyroxine (SYNTHROID) 100 MCG tablet Take 100 mcg by mouth daily before breakfast.   montelukast (SINGULAIR) 10 MG tablet Take 1 tablet (10 mg total) by mouth at bedtime.   ondansetron (ZOFRAN ODT) 4 MG disintegrating tablet Take 1 tablet (4 mg total) by mouth every 6 (six) hours as needed.   Polyethyl Glycol-Propyl Glycol (SYSTANE) 0.4-0.3 % SOLN Apply 2 drops to eye daily as needed.   Polyethylene Glycol 3350 (MIRALAX PO) Take 17 g by mouth daily as needed (constipation).    PROAIR HFA 108 (90 Base) MCG/ACT inhaler Inhale 1-2 puffs into the lungs every 6 (six) hours as needed for wheezing.   Probiotic Product (ALIGN) 4 MG CAPS Take 1 capsule by mouth daily.   psyllium (METAMUCIL) 58.6 % powder Take 1 packet by mouth daily as needed (constipation).   rosuvastatin (CRESTOR) 10 MG tablet Take 5 mg by mouth at bedtime.   traMADol (ULTRAM) 50 MG tablet Take 50 mg by mouth every 6 (six) hours as needed.     Allergies:   Augmentin [amoxicillin-pot clavulanate], Azithromycin, Erythromycin, Almond oil, Amoxicillin, Atorvastatin, Clavulanic acid, Codeine, Ketoconazole, Ketoconazole, Latex, Lincomycin, Morphine, Morphine sulfate, Morphine sulfate, Other, Shellfish allergy, Simvastatin, Sulfamethoxazole-trimethoprim, Clindamycin, Clindamycin/lincomycin, and Erythromycin base   Social History   Socioeconomic History   Marital status: Married    Spouse name: Not on file   Number of children: 1   Years of education: bus.school   Highest education level: Not on file  Occupational History   Occupation: Retired    Fish farm manager: RETIRED  Tobacco Use   Smoking status: Former    Packs/day: 0.25    Years: 15.00    Pack years: 3.75    Types: Cigarettes    Quit date: 06/01/1975    Years since quitting: 45.5   Smokeless tobacco: Never   Tobacco comments:    Stopped  smoking age 36  Vaping Use   Vaping Use: Never used  Substance and Sexual Activity   Alcohol use: No    Alcohol/week: 0.0 standard drinks   Drug use: No   Sexual activity: Never    Birth control/protection: Surgical    Comment: HYST, intercourse age unknown, sexual partner less than 5  Other Topics Concern   Not on file  Social History Narrative   Daily caffeine    Social Determinants of Health   Financial Resource Strain: Not on file  Food Insecurity: Not on file  Transportation Needs: Not on file  Physical Activity: Not on file  Stress: Not on file  Social Connections: Not on file     Family History: The patient's family history includes Alcohol abuse in her father; Allergies in her sister; Allergy (severe) in her sister; Alzheimer's disease in her mother; Arthritis in her brother; Breast cancer (age of onset: 30) in her sister; Colon cancer in her paternal aunt; Colon polyps in her brother and sister; Congestive Heart Failure in her father; Dementia in her mother; Diabetes in her sister; Heart attack in her maternal grandfather, maternal grandmother, and paternal grandfather; Heart disease in her maternal grandmother; Hypertension in her father; Irritable bowel syndrome in her mother; Other in her sister and sister; Stroke in her mother. There is no history of Esophageal cancer, Rectal cancer, or Stomach cancer.  ROS:   Please see the history of present illness.    ROS  All other systems reviewed and negative.   EKGs/Labs/Other Studies Reviewed:    The following studies were reviewed today: none  EKG:  EKG is not ordered today.   Recent Labs: 11/05/2020: ALT 15; BUN 16; Creatinine, Ser 0.80; Hemoglobin 13.3; Platelets 217; Potassium 3.6; Sodium 138   Recent Lipid Panel    Component Value Date/Time   CHOL 124 07/23/2013 0632   TRIG 69 07/23/2013 0632   HDL 68 07/23/2013 0632   CHOLHDL 1.8 07/23/2013 0632   VLDL 14 07/23/2013 0632   LDLCALC 42 07/23/2013 8366     Physical Exam:    VS:  BP 136/80   Pulse 74   Ht 5' 7.5" (1.715 m)   Wt 174 lb 12.8 oz (79.3 kg)   LMP  (LMP Unknown)   SpO2 97%   BMI 26.97 kg/m     Wt Readings from Last 3 Encounters:  12/04/20 174 lb 12.8 oz (79.3 kg)  11/20/20 173 lb 6.4 oz (78.7 kg)  11/05/20 171 lb (77.6 kg)    GEN: Well nourished, well developed in no acute distress HEENT: Normal NECK: No JVD; No carotid bruits LYMPHATICS: No lymphadenopathy CARDIAC:RRR, no murmurs, rubs, gallops RESPIRATORY:  Clear to auscultation without rales, wheezing or rhonchi  ABDOMEN: Soft, non-tender, non-distended MUSCULOSKELETAL:  No edema; No deformity  SKIN: Warm and dry NEUROLOGIC:  Alert and oriented x 3 PSYCHIATRIC:  Normal affect   ASSESSMENT:    1. PAF (paroxysmal atrial fibrillation) ([place])   2. OSA (obstructive sleep apnea)    PLAN:    In order of problems listed above:  1.  PAF  -she continues to maintain NSR on exam -she was recently seen in afib clinic after an ER visit on 6/8 for afib with RVR and spontaneously converted to NSR and no changes were made -since her visit with afib clinic she has had another episode where she thought she was going to go back into afib with RVR -no major bleeding on DOAC>> she recently had some mild hematuria related to a prolapsed uterus -Continue prescription drug management with Eliquis 5mg  BID >> refilled for 6 months -I have recommended increasing Cardizem CD to 240mg  daily -I have personally reviewed and interpreted outside labs performed by the ER on 6/8 which showed SCr 0.80, K+ 3.6 and Hbg 13.3    2.  OSA - The patient is tolerating PAP therapy well without any problems. The PAP download performed by his DME was personally reviewed and interpreted by me today and showed an AHI of 6.9/hr on auto BIPAP with 83% compliance in using more than 4 hours nightly.  The patient has been using and benefiting from PAP use and will continue to benefit from therapy.      Medication Adjustments/Labs and Tests Ordered: Current medicines are reviewed at length with the patient today.  Concerns regarding medicines are outlined above.  No orders of the defined types were placed in this encounter.  No orders of the defined types were placed in this encounter.   Signed, Fransico Him, MD  12/04/2020 9:08 AM    [place]

## 2020-12-04 NOTE — Addendum Note (Signed)
Addended by: Antonieta Iba on: 12/04/2020 09:45 AM   Modules accepted: Orders

## 2020-12-04 NOTE — Patient Instructions (Signed)
Medication Instructions:  Your physician has recommended you make the following change in your medication: 1) INCREASE Cardizem (diltiazem) to 240 mg daily   *If you need a refill on your cardiac medications before your next appointment, please call your pharmacy*  Follow-Up: At [facility], you and your health needs are our priority.  As part of our continuing mission to provide you with exceptional heart care, we have created designated Provider Care Teams.  These Care Teams include your primary Cardiologist (physician) and Advanced Practice Providers (APPs -  Physician Assistants and Nurse Practitioners) who all work together to provide you with the care you need, when you need it.  Your next appointment:   6 month(s)  The format for your next appointment:   In Person  Provider:   You may see Fransico Him, MD or one of the following Advanced Practice Providers on your designated Care Team:   Melina Copa, PA-C Ermalinda Barrios, PA-C

## 2020-12-05 ENCOUNTER — Telehealth: Payer: Self-pay | Admitting: Cardiology

## 2020-12-05 NOTE — Telephone Encounter (Signed)
Spoke with the patient and advised her that Cardizem 240 mg daily was the correct dose. Patient states that she was told at her appointment that her Cardizem would be increased by 20 mg so that she would be taking 200 mg daily. I once again advised the patient that Dr. Radford Pax increased her dose to 240 mg daily. Patient states that this is not what she was told at her appointment.  I have spoken with Dr. Radford Pax and cardizem 240 mg is the correct dose.  Called the patient back and advised her of this. Patient verbalized understanding.

## 2020-12-05 NOTE — Telephone Encounter (Signed)
Pt is reaching out because she states that Dr. Radford Pax said at last appt that she'd be increasing Diltiazem by 20mg  from the 180mg  she normally takes.Marland Kitchen pt just picked up her rx and it is for 240 mg pt would like to know if this is correct... please advise.

## 2020-12-09 ENCOUNTER — Telehealth: Payer: Self-pay | Admitting: Cardiology

## 2020-12-09 NOTE — Telephone Encounter (Signed)
Pt c/o medication issue:  1. Name of Medication: diltiazem (CARDIZEM CD) 240 MG 24 hr capsule   2. How are you currently taking this medication (dosage and times per day)? Take 1 capsule (240 mg total) by mouth daily. - Oral  3. Are you having a reaction (difficulty breathing--STAT)? yes  4. What is your medication issue? dizziness, in the mornings is off balance and having really bad problems with constipation..  miralax...   headaches and joint pains in neck and hands and congestion, cramping in legs and on and off chills since yesterday...  increase took place on the 7th

## 2020-12-09 NOTE — Telephone Encounter (Signed)
Spoke with the patient who reports that over the past couple of days she has been having some dizziness in the mornings. She states that she has felt off balance. She has not felt like she is going to pass out. She reports recent BP and HR readings of 137/73, 65 and 137/72, 67. Patient is still concerned about the increase in her diltiazem which I have once again reassured her is the correct increased dose. Advised patient to ensure she is staying hydrated, change positions slowly, and continue to monitor BP/HR. Patient also states she has been having issue with constipation over the last several days as well as headache, congestions, and joint pains. Patient also reports on and off chills however that is not a new symptoms for her. She is not running a fever. Patient has an appointment with her PCP early next week and I have advised her to discuss her concerns with him.

## 2020-12-16 DIAGNOSIS — M810 Age-related osteoporosis without current pathological fracture: Secondary | ICD-10-CM | POA: Diagnosis not present

## 2020-12-16 DIAGNOSIS — G4733 Obstructive sleep apnea (adult) (pediatric): Secondary | ICD-10-CM | POA: Diagnosis not present

## 2020-12-16 DIAGNOSIS — D6869 Other thrombophilia: Secondary | ICD-10-CM | POA: Diagnosis not present

## 2020-12-16 DIAGNOSIS — I480 Paroxysmal atrial fibrillation: Secondary | ICD-10-CM | POA: Diagnosis not present

## 2020-12-17 ENCOUNTER — Ambulatory Visit: Payer: Medicare PPO | Admitting: Obstetrics and Gynecology

## 2020-12-18 ENCOUNTER — Encounter: Payer: Self-pay | Admitting: *Deleted

## 2021-01-05 ENCOUNTER — Telehealth: Payer: Self-pay

## 2021-01-05 NOTE — Telephone Encounter (Signed)
Called to discuss interest/availability in participation in next PREP program at [facility] beginning Aug 22, every M/W 10-11:15. She wants to take a day or two to think about it and call back with her decision.

## 2021-01-09 DIAGNOSIS — Z03818 Encounter for observation for suspected exposure to other biological agents ruled out: Secondary | ICD-10-CM | POA: Diagnosis not present

## 2021-01-09 DIAGNOSIS — M791 Myalgia, unspecified site: Secondary | ICD-10-CM | POA: Diagnosis not present

## 2021-01-09 DIAGNOSIS — R519 Headache, unspecified: Secondary | ICD-10-CM | POA: Diagnosis not present

## 2021-01-20 ENCOUNTER — Other Ambulatory Visit: Payer: Self-pay | Admitting: Obstetrics and Gynecology

## 2021-01-20 DIAGNOSIS — Z1231 Encounter for screening mammogram for malignant neoplasm of breast: Secondary | ICD-10-CM

## 2021-01-30 ENCOUNTER — Telehealth: Payer: Self-pay | Admitting: Cardiology

## 2021-01-30 NOTE — Telephone Encounter (Signed)
Patient c/o Palpitations:  High priority if patient c/o lightheadedness, shortness of breath, or chest pain  How long have you had palpitations/irregular HR/ Afib? Are you having the symptoms now? Since yesterday... yes   Are you currently experiencing lightheadedness, SOB or CP? No   Do you have a history of afib (atrial fibrillation) or irregular heart rhythm? Yes   Have you checked your BP or HR? (document readings if available): 103/79 hr 131.. 115/81 hr 131   Are you experiencing any other symptoms? pt had dental work done yesterday and is having afib.. pt states that she felt the afib prior to dental work, during procedure was placed on a monitor and dealt with a few bp issues. Pt says that

## 2021-01-30 NOTE — Telephone Encounter (Signed)
Returned call to pt.  Reassured that pulse in the 60's was normal.  Pt had brief episode of afib yesterday at dental office.  Pt now in sinus rhythm.  Pt calling for reassurance.

## 2021-02-03 ENCOUNTER — Ambulatory Visit: Payer: Medicare PPO | Admitting: Internal Medicine

## 2021-02-05 ENCOUNTER — Telehealth: Payer: Self-pay | Admitting: Cardiology

## 2021-02-05 NOTE — Telephone Encounter (Signed)
Spoke with the patient in regards to her diltiazem. She states that she has been having a lot of trouble with aches and pains recently in her knees, arms, elbows, and neck. She reports that she was feeling really bad the other day and took her BP and HR. She reports readings of 110/65, 66 and 97/62, 65. Patient denies any dizziness or lightheadedness. Reassured patient that heart rate and blood pressure were okay, unless systolic remains below [id number] she does not need to be concerned or if she develops any symptoms.  Patient has many concerns about the pain in her neck and her arm. She is going to see a specialist soon. She feels as if she can not do the things that she used to due to the muscle pains. Advised her to keep appointment with Dr. Ellene Route.  She will continue to monitor her BP and HR and let us know if they get too low or she becomes symptomatic.

## 2021-02-05 NOTE — Telephone Encounter (Signed)
New Message:      Patient says she would like for Carlyle to give her a call. She said she wants to discuss her medicine with her please.

## 2021-02-09 DIAGNOSIS — Z23 Encounter for immunization: Secondary | ICD-10-CM | POA: Diagnosis not present

## 2021-02-16 ENCOUNTER — Ambulatory Visit: Payer: Medicare PPO | Admitting: Cardiology

## 2021-02-17 DIAGNOSIS — Z20828 Contact with and (suspected) exposure to other viral communicable diseases: Secondary | ICD-10-CM | POA: Diagnosis not present

## 2021-02-18 ENCOUNTER — Ambulatory Visit: Payer: Medicare PPO | Admitting: Podiatry

## 2021-02-18 ENCOUNTER — Encounter: Payer: Self-pay | Admitting: Podiatry

## 2021-02-18 ENCOUNTER — Other Ambulatory Visit: Payer: Self-pay

## 2021-02-18 DIAGNOSIS — L84 Corns and callosities: Secondary | ICD-10-CM | POA: Diagnosis not present

## 2021-02-18 DIAGNOSIS — J301 Allergic rhinitis due to pollen: Secondary | ICD-10-CM | POA: Diagnosis not present

## 2021-02-18 DIAGNOSIS — D689 Coagulation defect, unspecified: Secondary | ICD-10-CM

## 2021-02-18 DIAGNOSIS — M79672 Pain in left foot: Secondary | ICD-10-CM | POA: Diagnosis not present

## 2021-02-18 DIAGNOSIS — I480 Paroxysmal atrial fibrillation: Secondary | ICD-10-CM | POA: Diagnosis not present

## 2021-02-18 DIAGNOSIS — Q828 Other specified congenital malformations of skin: Secondary | ICD-10-CM

## 2021-02-18 DIAGNOSIS — D6869 Other thrombophilia: Secondary | ICD-10-CM | POA: Diagnosis not present

## 2021-02-18 DIAGNOSIS — M79671 Pain in right foot: Secondary | ICD-10-CM | POA: Diagnosis not present

## 2021-02-18 DIAGNOSIS — K219 Gastro-esophageal reflux disease without esophagitis: Secondary | ICD-10-CM | POA: Diagnosis not present

## 2021-02-23 DIAGNOSIS — G4733 Obstructive sleep apnea (adult) (pediatric): Secondary | ICD-10-CM | POA: Diagnosis not present

## 2021-02-23 NOTE — Progress Notes (Signed)
Subjective:  Patient ID: Kimberly [name], female    DOB: [birth year],  MRN: [id number]  Kimberly [name] presents to clinic today for at risk foot care with h/o thrombophilia, a clotting disorder. 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 Aggravating factors include wearing enclosed shoe gear and weightbearing with and without shoe gear. Pain is relieved with periodic professional debridement.  She is on Eliquis.  She uses padding on a daily basis for her interdigital corns right 2nd toe and right hallux.  PCP is Lajean Manes, MD , and last visit was 02/18/2021.  Allergies  Allergen Reactions   Augmentin [Amoxicillin-Pot Clavulanate] Nausea Only and Swelling    Has patient had a PCN reaction causing immediate rash, facial/tongue/throat swelling, SOB or lightheadedness with hypotension: No Has patient had a PCN reaction causing severe rash involving mucus membranes or skin necrosis: No Has patient had a PCN reaction that required hospitalization No Has patient had a PCN reaction occurring within the last 10 years: No If all of the above answers are "NO", then may proceed with Cephalosporin use.  Other reaction(s): nausea   Azithromycin Other (See Comments)    Pancreatitis Other reaction(s): Other, pancreatitis   Erythromycin Hives    Reaction 1 time, when she was younger. Reaction 1 time, when she was younger.  Other reaction(s): hives   Almond Oil Other (See Comments)    Almonds: per allergy testing.   Amoxicillin Nausea Only   Atorvastatin Other (See Comments)    Muscle weakness  Muscle weakness  Other reaction(s): muscle weakness, Other   Clavulanic Acid Nausea Only   Codeine Nausea Only    Other reaction(s): Nausea/Vomiting   Ketoconazole Nausea And Vomiting   Ketoconazole Nausea Only and Nausea And Vomiting    Other reaction(s): Unknown   Latex Other (See Comments)    Other   Lincomycin      Stomach upset    Morphine Nausea Only    Other reaction(s): Unknown   Morphine Sulfate Nausea Only and Other (See Comments)      Unknown   Morphine Sulfate     Other reaction(s): Unknown   Other Other (See Comments)    Shellfish mix: per allergy testing. Other reaction(s): nausea   Shellfish Allergy Other (See Comments)    Shellfish mix: per allergy testing.   Simvastatin Other (See Comments)    Muscle pain Other reaction(s): muscle pain, Other   Sulfamethoxazole-Trimethoprim Nausea Only    Upset stomach    Clindamycin Rash   Clindamycin/Lincomycin Nausea And Vomiting and Rash   Erythromycin Base Rash    Review of Systems: Negative except as noted in the HPI. Objective:   Constitutional Willadeen Colantuono is a pleasant 80 y.o. Caucasian female, WD, WN in NAD. AAO x 3.   Vascular Capillary refill time to digits immediate b/l. Palpable DP pulse(s) b/l lower extremities Palpable PT pulse(s) b/l lower extremities Pedal hair sparse. Lower extremity skin temperature gradient within normal limits. No pain with calf compression b/l. No edema noted b/l lower extremities. No cyanosis or clubbing noted.  Neurologic Normal speech. Oriented to person, place, and time. Protective sensation intact 5/5 intact bilaterally with 10g monofilament b/l. Vibratory sensation intact b/l.  Dermatologic Skin warm and supple b/l lower extremities. No open wounds b/l lower extremities. No interdigital macerations b/l lower extremities. Toenails b/l lower extremities well maintained with adequate length. No erythema, no edema, no drainage, no fluctuance. Hyperkeratotic lesion(s) R  2nd toe, submet head 2 left foot, and submet head 3 left foot.  No erythema, no edema, no drainage, no fluctuance. Porokeratotic lesion(s) R hallux. No erythema, no edema, no drainage, no fluctuance.  Orthopedic: Normal muscle strength 5/5 to all lower extremity muscle groups bilaterally. Hallux valgus with bunion deformity noted b/l  lower extremities. Hammertoe(s) noted to the 2-5 bilaterally. Patient ambulates independent of any assistive aids.   Radiographs: None Assessment:   1. Corns and callosities   2. Porokeratosis   3. Pain in both feet   4. Clotting disorder [facility])    Plan:  Patient was evaluated and treated and all questions answered. Consent given for treatment as described below: -Examined patient. -Patient to continue soft, supportive shoe gear daily. -Corn(s) R 2nd toe and callus(es) submet head 2 left foot and submet head 3 left foot were pared utilizing sterile scalpel blade without incident. Total number debrided =3. -Painful porokeratotic lesion(s) R hallux pared and enucleated with sterile scalpel blade without incident. Total number of lesions debrided=1. Continue digital padding for interdigital corns right foot. -Patient to report any pedal injuries to medical professional immediately. -Patient/POA to call should there be question/concern in the interim.  Return in about 3 months (around 05/20/2021).  Marzetta Board, DPM

## 2021-03-06 ENCOUNTER — Other Ambulatory Visit: Payer: Self-pay

## 2021-03-06 ENCOUNTER — Ambulatory Visit
Admission: RE | Admit: 2021-03-06 | Discharge: 2021-03-06 | Disposition: A | Discharge status: Home / Self Care | Payer: Medicare PPO | Source: Ambulatory Visit | Attending: Obstetrics and Gynecology | Admitting: Obstetrics and Gynecology

## 2021-03-06 DIAGNOSIS — Z1231 Encounter for screening mammogram for malignant neoplasm of breast: Secondary | ICD-10-CM | POA: Diagnosis not present

## 2021-03-13 DIAGNOSIS — R030 Elevated blood-pressure reading, without diagnosis of hypertension: Secondary | ICD-10-CM | POA: Diagnosis not present

## 2021-03-13 DIAGNOSIS — M542 Cervicalgia: Secondary | ICD-10-CM | POA: Diagnosis not present

## 2021-03-13 DIAGNOSIS — Z6827 Body mass index (BMI) 27.0-27.9, adult: Secondary | ICD-10-CM | POA: Diagnosis not present

## 2021-03-24 NOTE — Progress Notes (Deleted)
GYNECOLOGY  VISIT   HPI: 80 y.o.   Married  Caucasian  female   G0P0 with No LMP recorded (lmp unknown). Patient has had a hysterectomy.   here for breast and pelvic exam.    GYNECOLOGIC HISTORY: No LMP recorded (lmp unknown). Patient has had a hysterectomy. Contraception:  Hyst/BSO Menopausal hormone therapy:  *** Last mammogram:  03-06-21  3D/Neg/Birads1 Last pap smear:  2011 normal        OB History     Gravida  0   Para      Term      Preterm      AB      Living         SAB      IAB      Ectopic      Multiple      Live Births                 Patient Active Problem List   Diagnosis Date Noted   Microscopic hematuria 11/26/2020   Chronic pain 72/53/6644   Eosinophilic esophagitis 03/47/4259   Generalized anxiety disorder 11/10/2020   Hardening of the aorta (main artery of the heart) ([place]) 11/10/2020   Osteoporosis 11/10/2020   Spondylolisthesis at L4-L5 level 11/10/2020   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   Corns and callosities 05/20/2019   Pain in both feet 05/20/2019   Chest pain 01/28/2019   Chest pain of uncertain etiology 56/38/7564   Adiposity 07/13/2018   Excess weight 07/13/2018   Hay fever 07/13/2018   PAF (paroxysmal atrial fibrillation) (HCC) 08/25/2017   Neck pain 12/30/2016   Chronic low back pain 08/12/2016   Syncope 05/13/2016   Heart palpitations 05/13/2016   Rhinitis, chronic 02/04/2016   IBS (irritable bowel syndrome) 07/14/2015   Nausea without vomiting 07/14/2015   OA (osteoarthritis) of knee 06/23/2015   OSA (obstructive sleep apnea) 05/14/2015   Acute back pain with sciatica 04/03/2015   Preoperative clearance 01/08/2015   Acute infection of nasal sinus 02/25/2014   Pancreatitis 07/22/2013   Leukocytosis, unspecified 07/22/2013   Amblyopia 03/07/2013   Retinal scar 03/07/2013   Cellulitis 01/15/2013   Cerebrovascular small vessel disease 11/08/2012   Angular blepharitis 10/11/2012   Dry eyes 10/11/2012   PCO  (posterior capsular opacification) 10/11/2012   PVD (posterior vitreous detachment) 10/11/2012   Crossover toe 09/20/2012   Cataracts, bilateral    Elevated cholesterol    Degenerative disc disease    Bunion    Ankle fracture    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[place])    Hemorrhoid    DUB (dysfunctional uterine bleeding)    DIARRHEA 09/16/2009   PERSONAL HX COLONIC POLYPS 09/16/2009   LACUNAR INFARCTION 07/16/2008   CONSTIPATION 07/12/2008   CHEST PAIN 07/12/2008   DYSPHAGIA 07/12/2008   Hypothyroidism 07/10/2008   Anxiety state 07/10/2008   DEPRESSION 07/10/2008   INTERNAL HEMORRHOIDS 07/10/2008   ESOPHAGEAL STRICTURE 07/10/2008   GERD 07/10/2008   HIATAL HERNIA 07/10/2008   DIVERTICULOSIS, COLON 07/10/2008   IRRITABLE BOWEL SYNDROME 07/10/2008   Osteoarthritis 07/10/2008   FIBROMYALGIA 07/10/2008   Blues 07/10/2008    Past Medical History:  Diagnosis Date   Allergy    Anal polyp 1998   Flex Sig    Anemia    Angular blepharitis of left eye    Ankle fracture    Stress fracture   Anxiety    Aortic atherosclerosis (HCC)    Asthma    border line has inhaler   Bunion  Cataracts, bilateral    Corneal scar    left eye   CPAP (continuous positive airway pressure) dependence    Degenerative disc disease    Depression    Diverticulosis of colon (without mention of hemorrhage) 2010   Colonoscopy   Dry eyes    bilateral   Enterocele    External hemorrhoids 2000   Colonoscopy   Family history of malignant neoplasm of gastrointestinal tract    Female cystocele    Fibromyalgia    GERD (gastroesophageal reflux disease)    Hiatal hernia 2005,2010   EGD   History of bronchitis    History of measles    History of mumps    History of strep sore throat    History of urinary tract infection    Hyperlipemia    Hypothyroidism    IBS (irritable bowel syndrome)    Imbalance    Internal hemorrhoids without mention of complication 5697,9480   Colonoscopy    Itching    Lacunar stroke  ([place])    Menopause    Migraine    OSA (obstructive sleep apnea) 05/14/2015   on BiPAP   Osteopenia 10/2013   T score -1.6 FRAX 10%/1.6%   PAF (paroxysmal atrial fibrillation) (HCC)    Pancreatitis    PCO (posterior capsular opacification)    left   Pneumonia    childhood illness   PONV (postoperative nausea and vomiting)    Pseudophakia, both eyes    PVD (posterior vitreous detachment) right   Rash    on back    Retinal scar    left   Rotator cuff disorder    pain, left shoulder   Shingles    Stricture and stenosis of esophagus 2005,2010   EGD    Stroke ([place])    Ulcerative colitis ([place])    Varicose veins    Vertigo    Wears glasses     Past Surgical History:  Procedure Laterality Date   ABDOMINAL HYSTERECTOMY     ANAL FISSURE REPAIR     BREAST EXCISIONAL BIOPSY Left    BUNIONECTOMY  2014   CATARACT EXTRACTION Bilateral 2013   COLONOSCOPY     polyp removed   ESOPHAGEAL DILATION     EYE SURGERY Left    FOOT SURGERY     left    HEMORRHOID SURGERY     MOUTH SURGERY  11/13/11   Cyst removed from [place]  1970's   REPLACEMENT TOTAL KNEE Right 2011   TONSILLECTOMY     TOTAL ABDOMINAL HYSTERECTOMY W/ BILATERAL SALPINGOOPHORECTOMY  1991   TAH BSO   TOTAL HIP ARTHROPLASTY     right    TOTAL KNEE ARTHROPLASTY     both   TOTAL KNEE ARTHROPLASTY Left 06/23/2015   Procedure: LEFT TOTAL KNEE ARTHROPLASTY;  Surgeon: Gaynelle Arabian, MD;  Location: [facility];  Service: Orthopedics;  Laterality: Left;    Current Outpatient Medications  Medication Sig Dispense Refill   acetaminophen (TYLENOL) 500 MG tablet Take 500 mg by mouth every 6 (six) hours as needed for moderate pain.      albuterol (VENTOLIN HFA) 108 (90 Base) MCG/ACT inhaler Inhale 2 puffs into the lungs every 6 (six) hours as needed for wheezing or shortness of breath. 18 g 3   apixaban (ELIQUIS) 5 MG TABS tablet Take 1 tablet (5 mg total) by mouth 2 (two) times  daily. 180 tablet 2   azelastine (  ASTELIN) 0.1 % nasal spray Place 1 spray into both nostrils 2 (two) times daily. Use in each nostril as directed 30 mL 5   bisacodyl (DULCOLAX) 5 MG EC tablet Take 5 mg by mouth daily as needed for moderate constipation.     CALCIUM PO Taking 2 chewables by mouth daily- 500mg      Cholecalciferol 100 MCG (4000 UT) TABS Take by mouth.     cycloSPORINE (RESTASIS) 0.05 % ophthalmic emulsion Place 1 drop into both eyes 2 (two) times daily.     diltiazem (CARDIZEM CD) 240 MG 24 hr capsule Take 1 capsule (240 mg total) by mouth daily. 90 capsule 3   EPINEPHrine 0.3 mg/0.3 mL IJ SOAJ injection Inject 0.3 mg into the muscle as needed (for allergic reaction). Reported on 05/28/2015 2 each 2   famotidine (PEPCID) 20 MG tablet Take 20 mg by mouth 2 (two) times daily. OTC medication     fluticasone (FLONASE) 50 MCG/ACT nasal spray Place 2 sprays into both nostrils 2 (two) times daily as needed for allergies.      levothyroxine (SYNTHROID) 100 MCG tablet Take 100 mcg by mouth daily before breakfast.     montelukast (SINGULAIR) 10 MG tablet Take 1 tablet (10 mg total) by mouth at bedtime. 30 tablet 5   ondansetron (ZOFRAN ODT) 4 MG disintegrating tablet Take 1 tablet (4 mg total) by mouth every 6 (six) hours as needed. 20 tablet 0   Polyethyl Glycol-Propyl Glycol (SYSTANE) 0.4-0.3 % SOLN Apply 2 drops to eye daily as needed. 30 mL 5   Polyethylene Glycol 3350 (MIRALAX PO) Take 17 g by mouth daily as needed (constipation).      PROAIR HFA 108 (90 Base) MCG/ACT inhaler Inhale 1-2 puffs into the lungs every 6 (six) hours as needed for wheezing. 18 g 3   Probiotic Product (ALIGN) 4 MG CAPS Take 1 capsule by mouth daily.     psyllium (METAMUCIL) 58.6 % powder Take 1 packet by mouth daily as needed (constipation).     rosuvastatin (CRESTOR) 10 MG tablet Take 5 mg by mouth at bedtime.  4   traMADol (ULTRAM) 50 MG tablet Take 50 mg by mouth every 6 (six) hours as needed.     No  current facility-administered medications for this visit.     ALLERGIES: Augmentin [amoxicillin-pot clavulanate], Azithromycin, Erythromycin, Almond oil, Amoxicillin, Atorvastatin, Clavulanic acid, Codeine, Ketoconazole, Ketoconazole, Latex, Lincomycin, Morphine, Morphine sulfate, Morphine sulfate, Other, Shellfish allergy, Simvastatin, Sulfamethoxazole-trimethoprim, Clindamycin, Clindamycin/lincomycin, and Erythromycin base  Family History  Problem Relation Age of Onset   Dementia Mother    Irritable bowel syndrome Mother    Alzheimer's disease Mother    Stroke Mother    Hypertension Father    Congestive Heart Failure Father    Alcohol abuse Father    Diabetes Sister    Allergy (severe) Sister    Colon cancer Paternal Aunt    Heart disease Maternal Grandmother    Heart attack Maternal Grandmother    Heart attack Paternal Grandfather    Heart attack Maternal Grandfather    Arthritis Brother    Colon polyps Brother    Other Sister        pituitary disease   Allergies Sister    Other Sister        joint problems   Breast cancer Sister 55   Colon polyps Sister    Esophageal cancer Neg Hx    Rectal cancer Neg Hx    Stomach cancer Neg Hx  Social History   Socioeconomic History   Marital status: Married    Spouse name: Not on file   Number of children: 1   Years of education: bus.school   Highest education level: Not on file  Occupational History   Occupation: Retired    Fish farm manager: RETIRED  Tobacco Use   Smoking status: Former    Packs/day: 0.25    Years: 15.00    Pack years: 3.75    Types: Cigarettes    Quit date: 06/01/1975    Years since quitting: 45.8   Smokeless tobacco: Never   Tobacco comments:    Stopped smoking age 58  Vaping Use   Vaping Use: Never used  Substance and Sexual Activity   Alcohol use: No    Alcohol/week: 0.0 standard drinks   Drug use: No   Sexual activity: Never    Birth control/protection: Surgical    Comment: HYST, intercourse age  unknown, sexual partner less than 5  Other Topics Concern   Not on file  Social History Narrative   Daily caffeine    Social Determinants of Health   Financial Resource Strain: Not on file  Food Insecurity: Not on file  Transportation Needs: Not on file  Physical Activity: Not on file  Stress: Not on file  Social Connections: Not on file  Intimate Partner Violence: Not on file    Review of Systems  PHYSICAL EXAMINATION:    LMP  (LMP Unknown)     General appearance: alert, cooperative and appears stated age Head: Normocephalic, without obvious abnormality, atraumatic Neck: no adenopathy, supple, symmetrical, trachea midline and thyroid normal to inspection and palpation Lungs: clear to auscultation bilaterally Breasts: normal appearance, no masses or tenderness, No nipple retraction or dimpling, No nipple discharge or bleeding, No axillary or supraclavicular adenopathy Heart: regular rate and rhythm Abdomen: soft, non-tender, no masses,  no organomegaly Extremities: extremities normal, atraumatic, no cyanosis or edema Skin: Skin color, texture, turgor normal. No rashes or lesions Lymph nodes: Cervical, supraclavicular, and axillary nodes normal. No abnormal inguinal nodes palpated Neurologic: Grossly normal  Pelvic: External genitalia:  no lesions              Urethra:  normal appearing urethra with no masses, tenderness or lesions              Bartholins and Skenes: normal                 Vagina: normal appearing vagina with normal color and discharge, no lesions              Cervix: no lesions                Bimanual Exam:  Uterus:  normal size, contour, position, consistency, mobility, non-tender              Adnexa: no mass, fullness, tenderness              Rectal exam: {yes no:[id number]}.  Confirms.              Anus:  normal sphincter tone, no lesions  Chaperone was present for exam:  ***  ASSESSMENT     PLAN     An After Visit Summary was printed and given  to the patient.  ______ minutes face to face time of which over 50% was spent in counseling.

## 2021-03-26 ENCOUNTER — Ambulatory Visit: Payer: Medicare PPO | Admitting: Obstetrics and Gynecology

## 2021-04-14 DIAGNOSIS — K5900 Constipation, unspecified: Secondary | ICD-10-CM | POA: Diagnosis not present

## 2021-04-14 DIAGNOSIS — I480 Paroxysmal atrial fibrillation: Secondary | ICD-10-CM | POA: Diagnosis not present

## 2021-04-14 DIAGNOSIS — D6869 Other thrombophilia: Secondary | ICD-10-CM | POA: Diagnosis not present

## 2021-04-21 ENCOUNTER — Ambulatory Visit: Payer: Medicare PPO | Admitting: Allergy and Immunology

## 2021-04-28 DIAGNOSIS — Z961 Presence of intraocular lens: Secondary | ICD-10-CM | POA: Diagnosis not present

## 2021-04-28 DIAGNOSIS — H35371 Puckering of macula, right eye: Secondary | ICD-10-CM | POA: Diagnosis not present

## 2021-04-28 DIAGNOSIS — H53412 Scotoma involving central area, left eye: Secondary | ICD-10-CM | POA: Diagnosis not present

## 2021-04-28 DIAGNOSIS — H43811 Vitreous degeneration, right eye: Secondary | ICD-10-CM | POA: Diagnosis not present

## 2021-04-28 DIAGNOSIS — H04123 Dry eye syndrome of bilateral lacrimal glands: Secondary | ICD-10-CM | POA: Diagnosis not present

## 2021-04-28 DIAGNOSIS — H31091 Other chorioretinal scars, right eye: Secondary | ICD-10-CM | POA: Diagnosis not present

## 2021-04-30 ENCOUNTER — Other Ambulatory Visit: Payer: Self-pay

## 2021-04-30 ENCOUNTER — Ambulatory Visit: Payer: Medicare PPO | Admitting: Cardiology

## 2021-04-30 ENCOUNTER — Encounter: Payer: Self-pay | Admitting: Cardiology

## 2021-04-30 VITALS — BP 126/78 | HR 57 | Ht 67.5 in | Wt 175.6 lb

## 2021-04-30 DIAGNOSIS — G4733 Obstructive sleep apnea (adult) (pediatric): Secondary | ICD-10-CM | POA: Diagnosis not present

## 2021-04-30 DIAGNOSIS — I480 Paroxysmal atrial fibrillation: Secondary | ICD-10-CM | POA: Diagnosis not present

## 2021-04-30 LAB — BASIC METABOLIC PANEL
BUN/Creatinine Ratio: 19 (ref 12–28)
BUN: 15 mg/dL (ref 8–27)
CO2: 27 mmol/L (ref 20–29)
Calcium: 9.8 mg/dL (ref 8.7–10.3)
Chloride: 101 mmol/L (ref 96–106)
Creatinine, Ser: 0.77 mg/dL (ref 0.57–1.00)
Glucose: 88 mg/dL (ref 70–99)
Potassium: 4.7 mmol/L (ref 3.5–5.2)
Sodium: 138 mmol/L (ref 134–144)
eGFR: 78 mL/min/{1.73_m2} (ref >59)

## 2021-04-30 LAB — CBC
Hematocrit: 38.3 % (ref 34.0–46.6)
Hemoglobin: 12.8 g/dL (ref 11.1–15.9)
MCH: 31.7 pg (ref 26.6–33.0)
MCHC: 33.4 g/dL (ref 31.5–35.7)
MCV: 95 fL (ref 79–97)
Platelets: 228 10*3/uL (ref 150–450)
RBC: 4.04 x10E6/uL (ref 3.77–5.28)
RDW: 11.9 % (ref 11.7–15.4)
WBC: 6.1 10*3/uL (ref 3.4–10.8)

## 2021-04-30 MED ORDER — APIXABAN 5 MG PO TABS: 5.0000 mg | ORAL_TABLET | Freq: Two times a day (BID) | ORAL | 2 refills | Status: DC

## 2021-04-30 MED ORDER — DILTIAZEM HCL ER COATED BEADS 240 MG PO CP24: 240.0000 mg | ORAL_CAPSULE | Freq: Every day | ORAL | 3 refills | Status: DC

## 2021-04-30 NOTE — Addendum Note (Signed)
Addended by: Mendel Ryder on: 04/30/2021 10:38 AM   Modules accepted: Orders

## 2021-04-30 NOTE — Telephone Encounter (Signed)
Prescription refill request for Eliquis received. Indication: Afib  Last office visit: 04/30/21 Radford Pax)  Scr: 0.80 (11/05/20) Age: 80 Weight: 79.7kg  Appropriate dose and refill sent to requested pharmacy.

## 2021-04-30 NOTE — Progress Notes (Signed)
Cardiology Office Note:    Date:  04/30/2021   ID:  Kimberly [name], DOB 1940-09-22, MRN [id number]  PCP:  Lajean Manes, MD  Cardiologist:  Fransico Him, MD    Referring MD: Lajean Manes, MD   Chief Complaint  Patient presents with   Sleep Apnea   Atrial Fibrillation    History of Present Illness:    Kimberly [name] is a 80 y.o. female with a hx of PAF on metoprolol and Eliquis for chads vas score of 5.  Metoprolol decreased by [facility] because of bradycardia.  She also has a history of chest pain with normal nuclear stress test and syncope felt secondary to dehydration.  She has a chronic RBBB. Due to chest pain a coronary CTA was done 11/2018 and showed  a calcium score of 0 and no evidence of CAD.  She has moderate OSA with an AHI of 16/hr on HST.    She is here today for followup and is doing well.  She denies any exertional chest pain or pressure, SOB, DOE, PND, orthopnea, dizziness, palpitations or syncope. Occasionally she will have some LE edema.  She is compliant with her meds and is tolerating meds with no SE.    She is doing well with her BiPAP device and thinks that she has gotten used to it.  She tolerates the mask and feels the pressure is adequate.  Since going on BiPAP she feels rested in the am and has no significant daytime sleepiness.  She denies any significant mouth or nasal dryness or nasal congestion.  She does not think that he snores.     Past Medical History:  Diagnosis Date   Allergy    Anal polyp 1998   Flex Sig    Anemia    Angular blepharitis of left eye    Ankle fracture    Stress fracture   Anxiety    Aortic atherosclerosis (HCC)    Asthma    border line has inhaler   Bunion    Cataracts, bilateral    Corneal scar    left eye   CPAP (continuous positive airway pressure) dependence    Degenerative disc disease    Depression    Diverticulosis of colon (without mention of hemorrhage) 2010   Colonoscopy   Dry eyes     bilateral   Enterocele    External hemorrhoids 2000   Colonoscopy   Family history of malignant neoplasm of gastrointestinal tract    Female cystocele    Fibromyalgia    GERD (gastroesophageal reflux disease)    Hiatal hernia 2005,2010   EGD   History of bronchitis    History of measles    History of mumps    History of strep sore throat    History of urinary tract infection    Hyperlipemia    Hypothyroidism    IBS (irritable bowel syndrome)    Imbalance    Internal hemorrhoids without mention of complication 4098,1191   Colonoscopy    Itching    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HCC)    Menopause    Migraine    OSA (obstructive sleep apnea) 05/14/2015   on BiPAP   Osteopenia 10/2013   T score -1.6 FRAX 10%/1.6%   PAF (paroxysmal atrial fibrillation) (HCC)    Pancreatitis    PCO (posterior capsular opacification)    left   Pneumonia    childhood illness   PONV (postoperative nausea and vomiting)    Pseudophakia, both eyes  PVD (posterior vitreous detachment) right   Rash    on back    Retinal scar    left   Rotator cuff disorder    pain, left shoulder   Shingles    Stricture and stenosis of esophagus 2005,2010   EGD    Stroke ([place])    Ulcerative colitis ([place])    Varicose veins    Vertigo    Wears glasses     Past Surgical History:  Procedure Laterality Date   ABDOMINAL HYSTERECTOMY     ANAL FISSURE REPAIR     BREAST EXCISIONAL BIOPSY Left    BUNIONECTOMY  2014   CATARACT EXTRACTION Bilateral 2013   COLONOSCOPY     polyp removed   ESOPHAGEAL DILATION     EYE SURGERY Left    FOOT SURGERY     left    HEMORRHOID SURGERY     MOUTH SURGERY  11/13/11   Cyst removed from [place]  1970's   REPLACEMENT TOTAL KNEE Right 2011   TONSILLECTOMY     TOTAL ABDOMINAL HYSTERECTOMY W/ BILATERAL SALPINGOOPHORECTOMY  1991   TAH BSO   TOTAL HIP ARTHROPLASTY     right    TOTAL KNEE ARTHROPLASTY     both   TOTAL KNEE  ARTHROPLASTY Left 06/23/2015   Procedure: LEFT TOTAL KNEE ARTHROPLASTY;  Surgeon: Gaynelle Arabian, MD;  Location: [facility];  Service: Orthopedics;  Laterality: Left;    Current Medications: Current Meds  Medication Sig   acetaminophen (TYLENOL) 500 MG tablet Take 500 mg by mouth every 6 (six) hours as needed for moderate pain.    albuterol (VENTOLIN HFA) 108 (90 Base) MCG/ACT inhaler Inhale 2 puffs into the lungs every 6 (six) hours as needed for wheezing or shortness of breath.   apixaban (ELIQUIS) 5 MG TABS tablet Take 1 tablet (5 mg total) by mouth 2 (two) times daily.   azelastine (ASTELIN) 0.1 % nasal spray Place 1 spray into both nostrils 2 (two) times daily. Use in each nostril as directed   bisacodyl (DULCOLAX) 5 MG EC tablet Take 5 mg by mouth daily as needed for moderate constipation.   CALCIUM PO Taking 2 chewables by mouth daily- 500mg    Cholecalciferol 100 MCG (4000 UT) TABS Take by mouth.   cycloSPORINE (RESTASIS) 0.05 % ophthalmic emulsion Place 1 drop into both eyes 2 (two) times daily.   diltiazem (CARDIZEM CD) 240 MG 24 hr capsule Take 1 capsule (240 mg total) by mouth daily.   EPINEPHrine 0.3 mg/0.3 mL IJ SOAJ injection Inject 0.3 mg into the muscle as needed (for allergic reaction). Reported on 05/28/2015   famotidine (PEPCID) 20 MG tablet Take 20 mg by mouth 2 (two) times daily. OTC medication   fluticasone (FLONASE) 50 MCG/ACT nasal spray Place 2 sprays into both nostrils 2 (two) times daily as needed for allergies.    levothyroxine (SYNTHROID) 100 MCG tablet Take 100 mcg by mouth daily before breakfast.   montelukast (SINGULAIR) 10 MG tablet Take 1 tablet (10 mg total) by mouth at bedtime.   ondansetron (ZOFRAN ODT) 4 MG disintegrating tablet Take 1 tablet (4 mg total) by mouth every 6 (six) hours as needed.   Polyethyl Glycol-Propyl Glycol (SYSTANE) 0.4-0.3 % SOLN Apply 2 drops to eye daily as needed.   Polyethylene Glycol 3350 (MIRALAX PO) Take 17 g by mouth daily as needed  (constipation).    PROAIR HFA 108 (90 Base) MCG/ACT  inhaler Inhale 1-2 puffs into the lungs every 6 (six) hours as needed for wheezing.   Probiotic Product (ALIGN) 4 MG CAPS Take 1 capsule by mouth daily.   psyllium (METAMUCIL) 58.6 % powder Take 1 packet by mouth daily as needed (constipation).   rosuvastatin (CRESTOR) 10 MG tablet Take 5 mg by mouth at bedtime.   traMADol (ULTRAM) 50 MG tablet Take 50 mg by mouth every 6 (six) hours as needed.     Allergies:   Augmentin [amoxicillin-pot clavulanate], Azithromycin, Erythromycin, Almond oil, Amoxicillin, Atorvastatin, Clavulanic acid, Codeine, Ketoconazole, Ketoconazole, Latex, Lincomycin, Morphine, Morphine sulfate, Morphine sulfate, Other, Shellfish allergy, Simvastatin, Sulfamethoxazole-trimethoprim, Clindamycin, Clindamycin/lincomycin, and Erythromycin base   Social History   Socioeconomic History   Marital status: Married    Spouse name: Not on file   Number of children: 1   Years of education: bus.school   Highest education level: Not on file  Occupational History   Occupation: Retired    Fish farm manager: RETIRED  Tobacco Use   Smoking status: Former    Packs/day: 0.25    Years: 15.00    Pack years: 3.75    Types: Cigarettes    Quit date: 06/01/1975    Years since quitting: 45.9   Smokeless tobacco: Never   Tobacco comments:    Stopped smoking age 77  Vaping Use   Vaping Use: Never used  Substance and Sexual Activity   Alcohol use: No    Alcohol/week: 0.0 standard drinks   Drug use: No   Sexual activity: Never    Birth control/protection: Surgical    Comment: HYST, intercourse age unknown, sexual partner less than 5  Other Topics Concern   Not on file  Social History Narrative   Daily caffeine    Social Determinants of Health   Financial Resource Strain: Not on file  Food Insecurity: Not on file  Transportation Needs: Not on file  Physical Activity: Not on file  Stress: Not on file  Social Connections: Not on file      Family History: The patient's family history includes Alcohol abuse in her father; Allergies in her sister; Allergy (severe) in her sister; Alzheimer's disease in her mother; Arthritis in her brother; Breast cancer (age of onset: 66) in her sister; Colon cancer in her paternal aunt; Colon polyps in her brother and sister; Congestive Heart Failure in her father; Dementia in her mother; Diabetes in her sister; Heart attack in her maternal grandfather, maternal grandmother, and paternal grandfather; Heart disease in her maternal grandmother; Hypertension in her father; Irritable bowel syndrome in her mother; Other in her sister and sister; Stroke in her mother. There is no history of Esophageal cancer, Rectal cancer, or Stomach cancer.  ROS:   Please see the history of present illness.    ROS  All other systems reviewed and negative.   EKGs/Labs/Other Studies Reviewed:    The following studies were reviewed today: none  EKG:  EKG is not ordered today.   Recent Labs: 11/05/2020: ALT 15; BUN 16; Creatinine, Ser 0.80; Hemoglobin 13.3; Platelets 217; Potassium 3.6; Sodium 138   Recent Lipid Panel    Component Value Date/Time   CHOL 124 07/23/2013 0632   TRIG 69 07/23/2013 0632   HDL 68 07/23/2013 0632   CHOLHDL 1.8 07/23/2013 0632   VLDL 14 07/23/2013 0632   LDLCALC 42 07/23/2013 0632    Physical Exam:    VS:  BP 126/78   Pulse (!) 57   Ht 5' 7.5" (1.715 m)  Wt 175 lb 9.6 oz (79.7 kg)   LMP  (LMP Unknown)   SpO2 97%   BMI 27.10 kg/m     Wt Readings from Last 3 Encounters:  04/30/21 175 lb 9.6 oz (79.7 kg)  12/04/20 174 lb 12.8 oz (79.3 kg)  11/20/20 173 lb 6.4 oz (78.7 kg)    GEN: Well nourished, well developed in no acute distress HEENT: Normal NECK: No JVD; No carotid bruits LYMPHATICS: No lymphadenopathy CARDIAC:RRR, no murmurs, rubs, gallops RESPIRATORY:  Clear to auscultation without rales, wheezing or rhonchi  ABDOMEN: Soft, non-tender,  non-distended MUSCULOSKELETAL:  No edema; No deformity  SKIN: Warm and dry NEUROLOGIC:  Alert and oriented x 3 PSYCHIATRIC:  Normal affect   ASSESSMENT:    1. PAF (paroxysmal atrial fibrillation) ([place])   2. OSA (obstructive sleep apnea)    PLAN:    In order of problems listed above:  1.  PAF  -She is maintaining normal sinus rhythm on exam and denies any palpitations -She has had some problems with mild hematuria in the past related prolapsed uterus but no recent issues of bleeding on DOAC  -Continue prescription drug management with Eliquis 5 mg twice daily and Cardizem CD 240 mg daily with as needed refills -Check Bmet and CBC  2.  OSA - The patient is tolerating PAP therapy well without any problems. The PAP download performed by his DME was personally reviewed and interpreted by me today and showed an AHI of 9.4 /hr on auto BiPAP with 80% compliance in using more than 4 hours nightly.  The patient has been using and benefiting from PAP use and will continue to benefit from therapy.  -Suspect her elevated AHI is related to mask leak which is noted on her download     Medication Adjustments/Labs and Tests Ordered: Current medicines are reviewed at length with the patient today.  Concerns regarding medicines are outlined above.  No orders of the defined types were placed in this encounter.  No orders of the defined types were placed in this encounter.   Signed, Fransico Him, MD  04/30/2021 10:28 AM    [place]

## 2021-04-30 NOTE — Patient Instructions (Signed)
Medication Instructions:  Your physician recommends that you continue on your current medications as directed. Please refer to the Current Medication list given to you today.  *If you need a refill on your cardiac medications before your next appointment, please call your pharmacy*   Lab Work: Cbc, Bmp- Today   If you have labs (blood work) drawn today and your tests are completely normal, you will receive your results only by: [place] (if you have MyChart) OR A paper copy in the mail If you have any lab test that is abnormal or we need to change your treatment, we will call you to review the results.   Testing/Procedures: None ordered    Follow-Up: At [facility], you and your health needs are our priority.  As part of our continuing mission to provide you with exceptional heart care, we have created designated Provider Care Teams.  These Care Teams include your primary Cardiologist (physician) and Advanced Practice Providers (APPs -  Physician Assistants and Nurse Practitioners) who all work together to provide you with the care you need, when you need it.  We recommend signing up for the patient portal called "MyChart".  Sign up information is provided on this After Visit Summary.  MyChart is used to connect with patients for Virtual Visits (Telemedicine).  Patients are able to view lab/test results, encounter notes, upcoming appointments, etc.  Non-urgent messages can be sent to your provider as well.   To learn more about what you can do with MyChart, go to [email or web address].    Your next appointment:   6 month(s)  The format for your next appointment:   In Person  Provider:   Fransico Him, MD {    Other Instructions None

## 2021-05-06 ENCOUNTER — Ambulatory Visit: Payer: Medicare PPO | Admitting: Internal Medicine

## 2021-05-06 ENCOUNTER — Encounter: Payer: Self-pay | Admitting: Internal Medicine

## 2021-05-06 VITALS — BP 138/80 | HR 84 | Ht 67.0 in | Wt 175.4 lb

## 2021-05-06 DIAGNOSIS — K219 Gastro-esophageal reflux disease without esophagitis: Secondary | ICD-10-CM

## 2021-05-06 DIAGNOSIS — K581 Irritable bowel syndrome with constipation: Secondary | ICD-10-CM | POA: Diagnosis not present

## 2021-05-06 DIAGNOSIS — Z86010 Personal history of colonic polyps: Secondary | ICD-10-CM | POA: Diagnosis not present

## 2021-05-06 MED ORDER — LINACLOTIDE 72 MCG PO CAPS
72.0000 ug | ORAL_CAPSULE | Freq: Every day | ORAL | 2 refills | Status: DC
Start: 2021-05-06 — End: 2021-06-09

## 2021-05-06 NOTE — Patient Instructions (Addendum)
Discontinue Miralax and Probiotics.   Start Linzess 53mcg- Take 1 capsule by mouth once daily.   You may use Famotidine 20mg  - Take twice daily as needed.   Follow up in 3 months.  Office to contact with an appointment.   If you are age 80 or older, your body mass index should be between 23-30. Your Body mass index is 27.47 kg/m. If this is out of the aforementioned range listed, please consider follow up with your Primary Care Provider.   The [facility] providers would like to encourage you to use [facility] to communicate with providers for non-urgent requests or questions.  Due to long hold times on the telephone, sending your provider a message by [facility] may be a faster and more efficient way to get a response.  Please allow 48 business hours for a response.  Please remember that this is for non-urgent requests.  _______________________________________________________  Thank you for choosing me and [facility].  Dr. Hilarie Fredrickson

## 2021-05-06 NOTE — Progress Notes (Signed)
   Subjective:    Patient ID: Kimberly [name], female    DOB: 1941/05/01, 80 y.o.   MRN: [id number]  HPI Kimberly [name] is a 80 year old female with a history of IBS with constipation, colonic diverticulosis, adenomatous colon polyps, GERD with a small hiatal hernia, atrial fibrillation on Eliquis, history of bladder prolapse, hypothyroidism who is here for follow-up.  She is here alone today and I last saw her in February 2021.  She reports that on the whole she has been doing well.  She still struggled with constipation and ongoing "dry stools".  This is despite MiraLAX 17 g daily.  She tries to eat a high-fiber diet.  She has intermittently tried probiotics.  She has noticed some intermittent lower abdominal discomfort and gas.  None of these symptoms are new.  No blood in stool or melena.  She has not had much in the way of heartburn and has not needed famotidine.  From an A. fib perspective her diltiazem was increased to 240 mg daily and she continues Eliquis.  Her husband Kimberly [name] continues to have ongoing medical issues but is doing okay.  He is treated for prostate cancer and also thyroid cancer.  She is previously seen Dr. Zigmund [name] for her bladder prolapse as well as microscopic hematuria.  Hematuria was found to be benign.   Review of Systems As per HPI, otherwise negative  Current Medications, Allergies, Past Medical History, Past Surgical History, Family History and Social History were reviewed in [employer] record.    Objective:   Physical Exam BP 138/80   Pulse 84   Ht 5\' 7"  (1.702 m)   Wt 175 lb 6.4 oz (79.6 kg)   LMP  (LMP Unknown)   SpO2 96%   BMI 27.47 kg/m  Gen: awake, alert, NAD HEENT: anicteric  Neuro: nonfocal     Assessment & Plan:  80 year old female with a history of IBS with constipation, colonic diverticulosis, adenomatous colon polyps, GERD with a small hiatal hernia, atrial fibrillation on Eliquis, history of bladder prolapse,  hypothyroidism who is here for follow-up.  IBS with constipation --not much benefit with MiraLAX and probiotic.  Stable symptoms overall.  We discussed IBS in general and reassurance provided --Discontinue MiraLAX and probiotic --Begin Linzess 72 mcg daily; we may need to dose titrate  2.  GERD --symptoms have abated without PPI therapy and she has not needed famotidine.  She can use famotidine 20 mg once to twice daily as needed for breakthrough heartburn type symptoms  3.  Personal history of adenomatous colon polyps and family history of colon polyps --her last colonoscopy was performed in August 2020.  8 polyps removed all subcentimeter.  These were tubular adenoma x6 benign lymphoid aggregate x2.  We discussed a 3-year surveillance colonoscopy versus discontinuation based on age.  At this time she is interested in repeat surveillance colonoscopy.  We can discuss this later this year.  She was seen 3 days after her last colonoscopy in the emergency department for atypical chest pain and there was question as to whether retained air to this episode.  I believe it resolved without significant intervention or negative outcome. --Discussed surveillance colonoscopy at follow-up  I will see her back in 4 to 5 months, sooner if needed  30 minutes total spent today including patient facing time, coordination of care, reviewing medical history/procedures/pertinent radiology studies, and documentation of the encounter.

## 2021-05-11 ENCOUNTER — Telehealth: Payer: Self-pay | Admitting: Internal Medicine

## 2021-05-11 NOTE — Telephone Encounter (Signed)
Inbound call from patient, states that she seen Dr. Hilarie Fredrickson on 12/7. He wanted her to start Linzess and follow up in 3 months.  Patient states that she has not started taking the Linzess yet due to having family in for the holidays. She wants to start taking it after christmas.  Seeking advise to make sure that is okay and requesting a call back. Please advise.

## 2021-05-11 NOTE — Telephone Encounter (Signed)
Spoke with pt and she states with Christmas and other issues with their car she has not had a chance to try the [place]. She has changed her diet and is eating more bran and seems to be doing better. She requested some diet information be mailed to her. If she can control things with the different diet she may hold off on trying the Linzess. High fiber diet mailed to pt along with pamphlet on constipation and IBS.

## 2021-06-03 ENCOUNTER — Encounter: Payer: Self-pay | Admitting: Podiatry

## 2021-06-03 ENCOUNTER — Ambulatory Visit: Payer: Medicare PPO | Admitting: Podiatry

## 2021-06-03 ENCOUNTER — Other Ambulatory Visit: Payer: Self-pay

## 2021-06-03 DIAGNOSIS — M79675 Pain in left toe(s): Secondary | ICD-10-CM

## 2021-06-03 DIAGNOSIS — M79674 Pain in right toe(s): Secondary | ICD-10-CM | POA: Diagnosis not present

## 2021-06-03 DIAGNOSIS — L84 Corns and callosities: Secondary | ICD-10-CM

## 2021-06-03 DIAGNOSIS — D689 Coagulation defect, unspecified: Secondary | ICD-10-CM

## 2021-06-03 DIAGNOSIS — Q828 Other specified congenital malformations of skin: Secondary | ICD-10-CM | POA: Diagnosis not present

## 2021-06-03 DIAGNOSIS — B351 Tinea unguium: Secondary | ICD-10-CM | POA: Diagnosis not present

## 2021-06-03 NOTE — Patient Instructions (Signed)
Oofos Oomg Eezee Low Shoes with stretchable uppers and memory foam insoles can be purchased online at: www.oofos.com. They can also be purchased at [employer] shoe store ([employer]), [employer] or [employer].  Also available in clogs, houseslippers, slide styles. 

## 2021-06-05 NOTE — Telephone Encounter (Signed)
Pt called back to say that she has been following the high fiber diet and feeling much better, having a BM almost every day. Pt has not tried the linzess as she has not needed it. She will continue with the diet and knows to call back if she has any further problems.

## 2021-06-05 NOTE — Telephone Encounter (Signed)
Patient called requesting too speak with you again regarding further treatment plan.

## 2021-06-07 NOTE — Progress Notes (Signed)
Subjective: Kimberly [name] is a 81 y.o. female patient seen today for follow up of at risk foot care. She has h/o clotting disorder. She is seen for  corns/calluses bilaterally and porokeratoses right hallux and painful thick toenails that are difficult to trim. Pain interferes with ambulation. Aggravating factors include wearing enclosed shoe gear. Pain is relieved with periodic professional debridement.  New problems reported today: None.  She is accompanied by her husband on today's visit.  PCP is Lajean Manes, MD. Last visit was: 02/18/2021.  Allergies  Allergen Reactions   Augmentin [Amoxicillin-Pot Clavulanate] Nausea Only and Swelling    Has patient had a PCN reaction causing immediate rash, facial/tongue/throat swelling, SOB or lightheadedness with hypotension: No Has patient had a PCN reaction causing severe rash involving mucus membranes or skin necrosis: No Has patient had a PCN reaction that required hospitalization No Has patient had a PCN reaction occurring within the last 10 years: No If all of the above answers are "NO", then may proceed with Cephalosporin use.  Other reaction(s): nausea   Azithromycin Other (See Comments)    Pancreatitis Other reaction(s): Other, pancreatitis   Erythromycin Hives    Reaction 1 time, when she was younger. Reaction 1 time, when she was younger.  Other reaction(s): hives   Almond Oil Other (See Comments)    Almonds: per allergy testing.   Amoxicillin Nausea Only   Atorvastatin Other (See Comments)    Muscle weakness  Muscle weakness  Other reaction(s): muscle weakness, Other   Clavulanic Acid Nausea Only   Codeine Nausea Only    Other reaction(s): Nausea/Vomiting   Ketoconazole Nausea And Vomiting   Ketoconazole Nausea Only and Nausea And Vomiting    Other reaction(s): Unknown   Latex Other (See Comments)    Other   Lincomycin     Stomach upset    Morphine Nausea Only    Other reaction(s): Unknown   Morphine  Sulfate Nausea Only and Other (See Comments)      Unknown   Morphine Sulfate     Other reaction(s): Unknown   Other Other (See Comments)    Shellfish mix: per allergy testing. Other reaction(s): nausea   Shellfish Allergy Other (See Comments)    Shellfish mix: per allergy testing.   Simvastatin Other (See Comments)    Muscle pain Other reaction(s): muscle pain, Other   Sulfamethoxazole-Trimethoprim Nausea Only    Upset stomach    Clindamycin Rash   Clindamycin/Lincomycin Nausea And Vomiting and Rash   Erythromycin Base Rash    Objective: Physical Exam  General: Patient is a pleasant 81 y.o. Caucasian female WD, WN in NAD. AAO x 3.   Neurovascular Examination: CFT immediate b/l LE. Palpable DP/PT pulses b/l LE. Digital hair sparse b/l. Skin temperature gradient WNL b/l. No pain with calf compression b/l. No edema noted b/l. No cyanosis or clubbing noted b/l LE.  Protective sensation intact 5/5 intact bilaterally with 10g monofilament b/l. Vibratory sensation intact b/l. Proprioception intact bilaterally.  Dermatological:  Pedal skin with normal turgor, texture and tone b/l lower extremities. No open wounds b/l LE. No interdigital macerations noted b/l LE. Toenails 1-5 b/l elongated, discolored, dystrophic, thickened, crumbly with subungual debris and tenderness to dorsal palpation. Hyperkeratotic lesion(s) R 2nd toe, submet head 2 left foot, and submet head 3 left foot.  No erythema, no edema, no drainage, no fluctuance. Porokeratotic lesion(s) R hallux. No erythema, no edema, no drainage, no fluctuance.  Musculoskeletal:  Muscle strength 5/5 to all lower extremity muscle  groups bilaterally. HAV with bunion deformity noted b/l LE. Hammertoe deformity noted 2-5 b/l.  Assessment: 1. Pain due to onychomycosis of toenails of both feet   2. Corns and callosities   3. Porokeratosis   4. Clotting disorder [facility])    Plan: Patient was evaluated and treated and all questions  answered. Consent given for treatment as described below: -No new findings. No new orders. -Mycotic toenails 1-5 bilaterally were debrided in length and girth with sterile nail nippers and dremel without incident. -Corn(s) R 2nd toe and callus(es) submet head 2 left foot and submet head 3 left foot were pared utilizing sterile scalpel blade without incident. Total number debrided =3. -Painful porokeratotic lesion(s) R hallux pared and enucleated with sterile scalpel blade without incident. Total number of lesions debrided=1. -Continue foam toe separator to afftected digits daily for protection. -Patient/POA to call should there be question/concern in the interim.  Return in about 3 months (around 09/01/2021).  Marzetta Board, DPM

## 2021-06-09 ENCOUNTER — Encounter: Payer: Self-pay | Admitting: Allergy and Immunology

## 2021-06-09 ENCOUNTER — Ambulatory Visit: Payer: Medicare PPO | Admitting: Allergy and Immunology

## 2021-06-09 ENCOUNTER — Other Ambulatory Visit: Payer: Self-pay

## 2021-06-09 VITALS — BP 132/70 | HR 73 | Temp 97.7°F | Resp 16 | Ht 67.5 in | Wt 172.5 lb

## 2021-06-09 DIAGNOSIS — Z8709 Personal history of other diseases of the respiratory system: Secondary | ICD-10-CM

## 2021-06-09 DIAGNOSIS — H8113 Benign paroxysmal vertigo, bilateral: Secondary | ICD-10-CM | POA: Diagnosis not present

## 2021-06-09 DIAGNOSIS — J3089 Other allergic rhinitis: Secondary | ICD-10-CM | POA: Diagnosis not present

## 2021-06-09 DIAGNOSIS — T781XXD Other adverse food reactions, not elsewhere classified, subsequent encounter: Secondary | ICD-10-CM

## 2021-06-09 DIAGNOSIS — H04123 Dry eye syndrome of bilateral lacrimal glands: Secondary | ICD-10-CM

## 2021-06-09 MED ORDER — FLUTICASONE PROPIONATE 50 MCG/ACT NA SUSP: 2.0000 | Freq: Two times a day (BID) | NASAL | 5 refills | Status: DC | PRN

## 2021-06-09 MED ORDER — EPINEPHRINE 0.3 MG/0.3ML IJ SOAJ: 0.3000 mg | INTRAMUSCULAR | 2 refills | Status: DC | PRN

## 2021-06-09 MED ORDER — SYSTANE 0.4-0.3 % OP SOLN
2.0000 [drp] | Freq: Every day | OPHTHALMIC | 5 refills | Status: AC | PRN
Start: 2021-06-09 — End: ?

## 2021-06-09 MED ORDER — AZELASTINE HCL 0.1 % NA SOLN: 2.0000 | Freq: Two times a day (BID) | NASAL | 5 refills | Status: AC | PRN

## 2021-06-09 MED ORDER — MONTELUKAST SODIUM 10 MG PO TABS: 10.0000 mg | ORAL_TABLET | Freq: Every day | ORAL | 5 refills | Status: DC

## 2021-06-09 MED ORDER — ALBUTEROL SULFATE HFA 108 (90 BASE) MCG/ACT IN AERS: 2.0000 | INHALATION_SPRAY | Freq: Four times a day (QID) | RESPIRATORY_TRACT | 3 refills | Status: AC | PRN

## 2021-06-09 NOTE — Progress Notes (Signed)
[facility] [place] [facility] [place]   Follow-up Note  Referring Provider: Lajean Manes, MD Primary Provider: Lajean Manes, MD Date of Office Visit: 06/09/2021  Subjective:   Kimberly [name] (DOB: 1941-04-29) is a 81 y.o. female who returns to the [facility] [place] [facility] on 06/09/2021 in re-evaluation of the following:  HPI: [name] returns to this clinic in evaluation of allergic rhinitis and dry eye syndrome and a history of shrimp allergy and a history of asthma.  Her last visit to this clinic was 14 Oct 2020.  The big issue for Kimberly [name] is that she has developed some vertigo recently.  About 3 weeks ago she started to develop problems with vertigo.  She does have a previous history of having this problem years ago.  There is no associated hearing loss or tinnitus.  She was given a prescription for meclizine from her primary care doctor but she has not used this medication to date.  She is actually improving regarding her vertigo in the past week or so.  She did start some Allegra and she has noticed that since using Allegra over the course the past 3 weeks she has been having more problems with her eyes.  She believes that her nose is doing relatively well.  She uses nasal fluticasone and montelukast.  She has not required a systemic steroid or an antibiotic for any type of airway issue.  She remains away from eating shrimp and eating foods that give her itchy mouth like such as grapes and walnuts.  She has apparently been having a problem with stiff neck and she has seen Dr. Ellene Route for this issue and it was recommended that she have evaluation with a masseuse or chiropractor to address this issue.  Allergies as of 06/09/2021       Reactions   Augmentin [amoxicillin-pot Clavulanate] Nausea Only, Swelling   Has patient had a PCN reaction causing immediate rash, facial/tongue/throat swelling, SOB or lightheadedness with hypotension: No Has patient had a PCN  reaction causing severe rash involving mucus membranes or skin necrosis: No Has patient had a PCN reaction that required hospitalization No Has patient had a PCN reaction occurring within the last 10 years: No If all of the above answers are "NO", then may proceed with Cephalosporin use. Other reaction(s): nausea   Azithromycin Other (See Comments)   Pancreatitis Other reaction(s): Other, pancreatitis   Erythromycin Hives   Reaction 1 time, when she was younger. Reaction 1 time, when she was younger. Other reaction(s): hives   Almond Oil Other (See Comments)   Almonds: per allergy testing.   Amoxicillin Nausea Only   Atorvastatin Other (See Comments)   Muscle weakness Muscle weakness Other reaction(s): muscle weakness, Other   Clavulanic Acid Nausea Only   Codeine Nausea Only   Other reaction(s): Nausea/Vomiting   Ketoconazole Nausea And Vomiting   Ketoconazole Nausea Only, Nausea And Vomiting   Other reaction(s): Unknown   Latex Other (See Comments)   Other   Lincomycin    Stomach upset    Morphine Nausea Only   Other reaction(s): Unknown   Morphine Sulfate Nausea Only, Other (See Comments)   Unknown   Morphine Sulfate    Other reaction(s): Unknown   Other Other (See Comments)   Shellfish mix: per allergy testing. Other reaction(s): nausea   Shellfish Allergy Other (See Comments)   Shellfish mix: per allergy testing.   Simvastatin Other (See Comments)   Muscle pain Other reaction(s): muscle pain,  Other   Sulfamethoxazole-trimethoprim Nausea Only   Upset stomach   Clindamycin Rash   Clindamycin/lincomycin Nausea And Vomiting, Rash   Erythromycin Base Rash        Medication List    acetaminophen 500 MG tablet Commonly known as: TYLENOL Take 500 mg by mouth every 6 (six) hours as needed for moderate pain.   albuterol 108 (90 Base) MCG/ACT inhaler Commonly known as: VENTOLIN HFA Inhale 2 puffs into the lungs every 6 (six) hours as needed for wheezing or  shortness of breath.   ProAir HFA 108 (90 Base) MCG/ACT inhaler Generic drug: albuterol Inhale 1-2 puffs into the lungs every 6 (six) hours as needed for wheezing.   Align 4 MG Caps Take 1 capsule by mouth daily.   apixaban 5 MG Tabs tablet Commonly known as: Eliquis Take 1 tablet (5 mg total) by mouth 2 (two) times daily.   azelastine 0.1 % nasal spray Commonly known as: ASTELIN Place 1 spray into both nostrils 2 (two) times daily. Use in each nostril as directed   bisacodyl 5 MG EC tablet Commonly known as: DULCOLAX Take 5 mg by mouth daily as needed for moderate constipation.   CALCIUM PO Taking 2 chewables by mouth daily- 500mg    Cholecalciferol 100 MCG (4000 UT) Tabs Take by mouth.   cycloSPORINE 0.05 % ophthalmic emulsion Commonly known as: RESTASIS Place 1 drop into both eyes 2 (two) times daily.   diltiazem 240 MG 24 hr capsule Commonly known as: CARDIZEM CD Take 1 capsule (240 mg total) by mouth daily.   EPINEPHrine 0.3 mg/0.3 mL Soaj injection Commonly known as: EPI-PEN Inject 0.3 mg into the muscle as needed (for allergic reaction). Reported on 05/28/2015   famotidine 20 MG tablet Commonly known as: PEPCID Take 20 mg by mouth 2 (two) times daily. OTC medication   fluticasone 50 MCG/ACT nasal spray Commonly known as: FLONASE Place 2 sprays into both nostrils 2 (two) times daily as needed for allergies.   levothyroxine 100 MCG tablet Commonly known as: SYNTHROID Take 100 mcg by mouth daily before breakfast.   meclizine 12.5 MG tablet Commonly known as: ANTIVERT Take 2 tablets by mouth as directed.   montelukast 10 MG tablet Commonly known as: SINGULAIR Take 1 tablet (10 mg total) by mouth at bedtime.   ondansetron 4 MG disintegrating tablet Commonly known as: Zofran ODT Take 1 tablet (4 mg total) by mouth every 6 (six) hours as needed.   psyllium 58.6 % powder Commonly known as: METAMUCIL Take 1 packet by mouth daily as needed  (constipation).   rosuvastatin 10 MG tablet Commonly known as: CRESTOR Take 5 mg by mouth at bedtime.   Systane 0.4-0.3 % Soln Generic drug: Polyethyl Glycol-Propyl Glycol Apply 2 drops to eye daily as needed.    Past Medical History:  Diagnosis Date   Allergy    Anal polyp 1998   Flex Sig    Anemia    Angular blepharitis of left eye    Ankle fracture    Stress fracture   Anxiety    Aortic atherosclerosis (HCC)    Asthma    border line has inhaler   Bunion    Cataracts, bilateral    Corneal scar    left eye   CPAP (continuous positive airway pressure) dependence    Degenerative disc disease    Depression    Diverticulosis of colon (without mention of hemorrhage) 2010   Colonoscopy   Dry eyes    bilateral  Enterocele    External hemorrhoids 2000   Colonoscopy   Family history of malignant neoplasm of gastrointestinal tract    Female cystocele    Fibromyalgia    GERD (gastroesophageal reflux disease)    Hiatal hernia 2005,2010   EGD   History of bronchitis    History of measles    History of mumps    History of strep sore throat    History of urinary tract infection    Hyperlipemia    Hypothyroidism    IBS (irritable bowel syndrome)    Imbalance    Internal hemorrhoids without mention of complication 1610,9604   Colonoscopy    Itching    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[place])    Menopause    Migraine    OSA (obstructive sleep apnea) 05/14/2015   on BiPAP   Osteopenia 10/2013   T score -1.6 FRAX 10%/1.6%   PAF (paroxysmal atrial fibrillation) (HCC)    Pancreatitis    PCO (posterior capsular opacification)    left   Pneumonia    childhood illness   PONV (postoperative nausea and vomiting)    Pseudophakia, both eyes    PVD (posterior vitreous detachment) right   Rash    on back    Retinal scar    left   Rotator cuff disorder    pain, left shoulder   Shingles    Stricture and stenosis of esophagus 2005,2010   EGD    Stroke ([place])    Ulcerative colitis ([place])     Varicose veins    Vertigo    Wears glasses     Past Surgical History:  Procedure Laterality Date   ABDOMINAL HYSTERECTOMY     ANAL FISSURE REPAIR     BREAST EXCISIONAL BIOPSY Left    BUNIONECTOMY  2014   CATARACT EXTRACTION Bilateral 2013   COLONOSCOPY     polyp removed   ESOPHAGEAL DILATION     EYE SURGERY Left    FOOT SURGERY     left    HEMORRHOID SURGERY     MOUTH SURGERY  11/13/11   Cyst removed from [place]  1970's   REPLACEMENT TOTAL KNEE Right 2011   TONSILLECTOMY     TOTAL ABDOMINAL HYSTERECTOMY W/ BILATERAL SALPINGOOPHORECTOMY  1991   TAH BSO   TOTAL HIP ARTHROPLASTY     right    TOTAL KNEE ARTHROPLASTY     both   TOTAL KNEE ARTHROPLASTY Left 06/23/2015   Procedure: LEFT TOTAL KNEE ARTHROPLASTY;  Surgeon: Gaynelle Arabian, MD;  Location: [facility];  Service: Orthopedics;  Laterality: Left;    Review of systems negative except as noted in HPI / PMHx or noted below:  Review of Systems  Constitutional: Negative.   HENT: Negative.    Eyes: Negative.   Respiratory: Negative.    Cardiovascular: Negative.   Gastrointestinal: Negative.   Genitourinary: Negative.   Musculoskeletal: Negative.   Skin: Negative.   Neurological: Negative.   Endo/Heme/Allergies: Negative.   Psychiatric/Behavioral: Negative.      Objective:   Vitals:   06/09/21 1107  BP: 132/70  Pulse: 73  Resp: 16  Temp: 97.7 F (36.5 C)  SpO2: 96%   Height: 5' 7.5" (171.5 cm)  Weight: 172 lb 8 oz (78.2 kg)   Physical Exam Constitutional:      Appearance: She is not diaphoretic.  HENT:     Head: Normocephalic.     Right Ear: Tympanic membrane,  ear canal and external ear normal.     Left Ear: Tympanic membrane, ear canal and external ear normal.     Nose: Nose normal. No mucosal edema or rhinorrhea.     Mouth/Throat:     Pharynx: Uvula midline. No oropharyngeal exudate.  Eyes:     Conjunctiva/sclera: Conjunctivae normal.  Neck:      Thyroid: No thyromegaly.     Trachea: Trachea normal. No tracheal tenderness or tracheal deviation.  Cardiovascular:     Rate and Rhythm: Normal rate and regular rhythm.     Heart sounds: Normal heart sounds, S1 normal and S2 normal. No murmur heard. Pulmonary:     Effort: No respiratory distress.     Breath sounds: Normal breath sounds. No stridor. No wheezing or rales.  Lymphadenopathy:     Head:     Right side of head: No tonsillar adenopathy.     Left side of head: No tonsillar adenopathy.     Cervical: No cervical adenopathy.  Skin:    Findings: No erythema or rash.     Nails: There is no clubbing.  Neurological:     Mental Status: She is alert.    Diagnostics:    Spirometry was performed and demonstrated an FEV1 of 1.24 at 57 % of predicted.  Assessment and Plan:   1. Perennial allergic rhinitis   2. Dry eye syndrome of both eyes   3. Adverse food reaction, subsequent encounter   4. History of asthma   5. Benign paroxysmal vertigo of both ears     1.  Continue to treat and prevent inflammation:   A. Fluticasone - 1-2 sprays each nostril 1 time per day  B. Montelukast 10 mg - 1 tablet 1 time per day  2. If needed:   A. Azelastine- 1-2 sprays each nostril 1-2 times per day  B. Systane eye drops  C. Epi-Pen, benadryl, MD/ER evaluation for allergic reaction  D. Albuterol HFA - 2 inhalations every 4-6 hours  E.  Meclizine 12.5 mg - 1 tablet every 6 hours  3.  Do not use any oral antihistamines secondary to dry eye  4.  Return to clinic in 6 months or earlier if needed  Fantashia appears to be doing pretty good on her plan of anti-inflammatory agents for her airway as noted above and her dry eye syndrome is a lot better while trying to remain away from the use of oral antihistamines.  She has developed some vertigo and this appears to be improving on its own and I suspect within a few more weeks this will probably resolve.  She is certainly welcome to use meclizine  should it be required.  Her history of asthma does not require any further evaluation at this point.  I will see her back in this clinic in 6 months or earlier if there is a problem.  Allena Katz, MD Allergy / Immunology [place]

## 2021-06-09 NOTE — Patient Instructions (Addendum)
°  1.  Continue to treat and prevent inflammation:   A. Fluticasone - 1-2 sprays each nostril 1 time per day  B. Montelukast 10 mg - 1 tablet 1 time per day  2. If needed:   A. Azelastine- 1-2 sprays each nostril 1-2 times per day  B. Systane eye drops  C. Epi-Pen, benadryl, MD/ER evaluation for allergic reaction  D. Albuterol HFA - 2 inhalations every 4-6 hours  E.  Meclizine 12.5 mg - 1 tablet every 6 hours  3.  Do not use any oral antihistamines secondary to dry eye  4.  Return to clinic in 6 months or earlier if needed

## 2021-06-10 ENCOUNTER — Encounter: Payer: Self-pay | Admitting: Allergy and Immunology

## 2021-06-10 ENCOUNTER — Telehealth: Payer: Self-pay | Admitting: Cardiology

## 2021-06-10 DIAGNOSIS — I480 Paroxysmal atrial fibrillation: Secondary | ICD-10-CM

## 2021-06-10 DIAGNOSIS — R0602 Shortness of breath: Secondary | ICD-10-CM

## 2021-06-10 NOTE — Telephone Encounter (Signed)
Spoke with the patient and scheduled her for an echocardiogram and lab work.

## 2021-06-10 NOTE — Telephone Encounter (Signed)
Spoke with the ot and she saw her allergist yesterday and she had reported that she has shortness of breath when walking to take out the trash... this is not new but seems to be worsening...she has a h/o asthma treated by Dr. Neldon Mc and he has made some recent changes with her inhalers but she says she is not seeing him again until 11/2021... she says that she only has mild ankle edema at the end of the day relieved with elevation... no problems with SOB when laying flat at night to sleep.. she just says that the allergist felt her breathing has "changed"... she also has a call out to her PCP Dr. Felipa Eth to hopefully see him in the next few days... no cough, no palpitations, but she has had some trouble with vertigo that she was given meclizine for that has given her some improvement.   She says she would like to see Dr, Radford Pax again... last OV 04/2021 at the request of her allergist... I have advised her that I will need to forward to her nurse to review her schedule and see if we can make her an appt since her next available is not until 09/2021.

## 2021-06-10 NOTE — Telephone Encounter (Signed)
Pt c/o Shortness Of Breath: STAT if SOB developed within the last 24 hours or pt is noticeably SOB on the phone  1. Are you currently SOB (can you hear that pt is SOB on the phone)? yes  2. How long have you been experiencing SOB? Slowly maybe the past month  3. Are you SOB when sitting or when up moving around? Both states it is her normal now  4. Are you currently experiencing any other symptoms? Vertigo   Patient states she has been having an increase in SOB and hoarseness in her voice. Patient states she saw her allergist yesterday and they said her breathing was not good and to contact Dr. Radford Pax. She says she does also feel a sore spot in her neck near her collarbone by that is a swollen gland. She says it comes and goes and woke her up one night. Patient states she thought it was just her being tired, because she has been active. Patient does sound slightly SOB while speaking. She also says she almost fainted due to vertigo twice and had to be taken in a wheelchair.

## 2021-06-10 NOTE — Telephone Encounter (Signed)
Spoke with the patient in regards to her concerns with shortness of breath and neck pain. She reports that shortness of breath has improved some with use of albuterol and azelastine. She states that her allergist, Dr. Neldon Mc was concerned about her change in breathing. She states that she has noticed a change as well, along with increased fatigue.  She states that she is using her BiPAP machine nightly but can usually only wear it for about 4 hours. She reports that it causes hoarseness and dry mouth. She did talk with [facility] and they are sending her new supplies. However, they have not been able to find the appropriate headgear for her.  Patient also concerned about pain in her neck. She states that she feels a throbbing pain that comes and goes. She states that it is sore to touch. She states that it is on the left side of her neck. She does feel that there is some swelling - possible swollen lymph node???  Advised patient that she needs to follow up with her PCP. She states that they are not able to see her until 1/31.

## 2021-06-16 ENCOUNTER — Other Ambulatory Visit: Payer: Medicare PPO | Admitting: *Deleted

## 2021-06-16 ENCOUNTER — Ambulatory Visit (HOSPITAL_COMMUNITY): Payer: Medicare PPO | Attending: Cardiovascular Disease

## 2021-06-16 ENCOUNTER — Other Ambulatory Visit: Payer: Self-pay

## 2021-06-16 DIAGNOSIS — I480 Paroxysmal atrial fibrillation: Secondary | ICD-10-CM | POA: Diagnosis not present

## 2021-06-16 DIAGNOSIS — R0602 Shortness of breath: Secondary | ICD-10-CM

## 2021-06-16 LAB — ECHOCARDIOGRAM COMPLETE
AR max vel: 2.52 cm2
AV Area VTI: 2.53 cm2
AV Area mean vel: 2.55 cm2
AV Mean grad: 4.5 mmHg
AV Peak grad: 8.7 mmHg
Ao pk vel: 1.48 m/s
Area-P 1/2: 3.34 cm2
P 1/2 time: 621 msec
S' Lateral: 2.9 cm

## 2021-06-16 LAB — BASIC METABOLIC PANEL
BUN/Creatinine Ratio: 20 (ref 12–28)
BUN: 16 mg/dL (ref 8–27)
CO2: 25 mmol/L (ref 20–29)
Calcium: 9.5 mg/dL (ref 8.7–10.3)
Chloride: 103 mmol/L (ref 96–106)
Creatinine, Ser: 0.8 mg/dL (ref 0.57–1.00)
Glucose: 81 mg/dL (ref 70–99)
Potassium: 3.8 mmol/L (ref 3.5–5.2)
Sodium: 140 mmol/L (ref 134–144)
eGFR: 74 mL/min/{1.73_m2} (ref >59)

## 2021-06-19 DIAGNOSIS — H04121 Dry eye syndrome of right lacrimal gland: Secondary | ICD-10-CM | POA: Diagnosis not present

## 2021-06-19 DIAGNOSIS — H04122 Dry eye syndrome of left lacrimal gland: Secondary | ICD-10-CM | POA: Diagnosis not present

## 2021-06-19 DIAGNOSIS — H04123 Dry eye syndrome of bilateral lacrimal glands: Secondary | ICD-10-CM | POA: Diagnosis not present

## 2021-06-19 DIAGNOSIS — H0100A Unspecified blepharitis right eye, upper and lower eyelids: Secondary | ICD-10-CM | POA: Diagnosis not present

## 2021-06-19 DIAGNOSIS — H0100B Unspecified blepharitis left eye, upper and lower eyelids: Secondary | ICD-10-CM | POA: Diagnosis not present

## 2021-06-22 ENCOUNTER — Other Ambulatory Visit (HOSPITAL_COMMUNITY): Payer: Medicare PPO

## 2021-06-24 ENCOUNTER — Ambulatory Visit: Payer: Medicare PPO | Admitting: Obstetrics and Gynecology

## 2021-06-30 DIAGNOSIS — M25511 Pain in right shoulder: Secondary | ICD-10-CM | POA: Diagnosis not present

## 2021-06-30 DIAGNOSIS — M542 Cervicalgia: Secondary | ICD-10-CM | POA: Diagnosis not present

## 2021-06-30 DIAGNOSIS — F419 Anxiety disorder, unspecified: Secondary | ICD-10-CM | POA: Diagnosis not present

## 2021-07-17 ENCOUNTER — Telehealth: Payer: Self-pay | Admitting: Cardiology

## 2021-07-17 NOTE — Telephone Encounter (Signed)
Patient states that her husband called her today and told her that Dr. Theodosia Blender office called him and told him that she needs to schedule an appt in 30 days. She is unsure why and I do not see any documentation. Patient's husband is a pt of Dr. Radford Pax also and just saw her yesterday 07/16/21 for a sleep compliance appt. I reviewed his chart as well and did not see anything about him needing to schedule an appt in 30 days either. Please advise.

## 2021-07-20 NOTE — Telephone Encounter (Signed)
Left detailed message informing pt this would forwarding to Dr. Theodosia Blender nurse to address when she returns.

## 2021-07-21 NOTE — Telephone Encounter (Signed)
Spoke with the patient who states that she thinks that her husband was mistaken and it was another one of her doctor's offices that called.  Advised that she is not due to see Dr. Radford Pax until June 2023. I have scheduled the patient for her yearly appointment. She thanked me for the assistance.

## 2021-09-01 IMAGING — DX DG CHEST 2V
2 series · 2 of 2 positions shown · non-contrast
Comparison: 05/11/2020

CLINICAL DATA: Chest pain, dyspnea

EXAM:
CHEST - 2 VIEW

[chest pa]
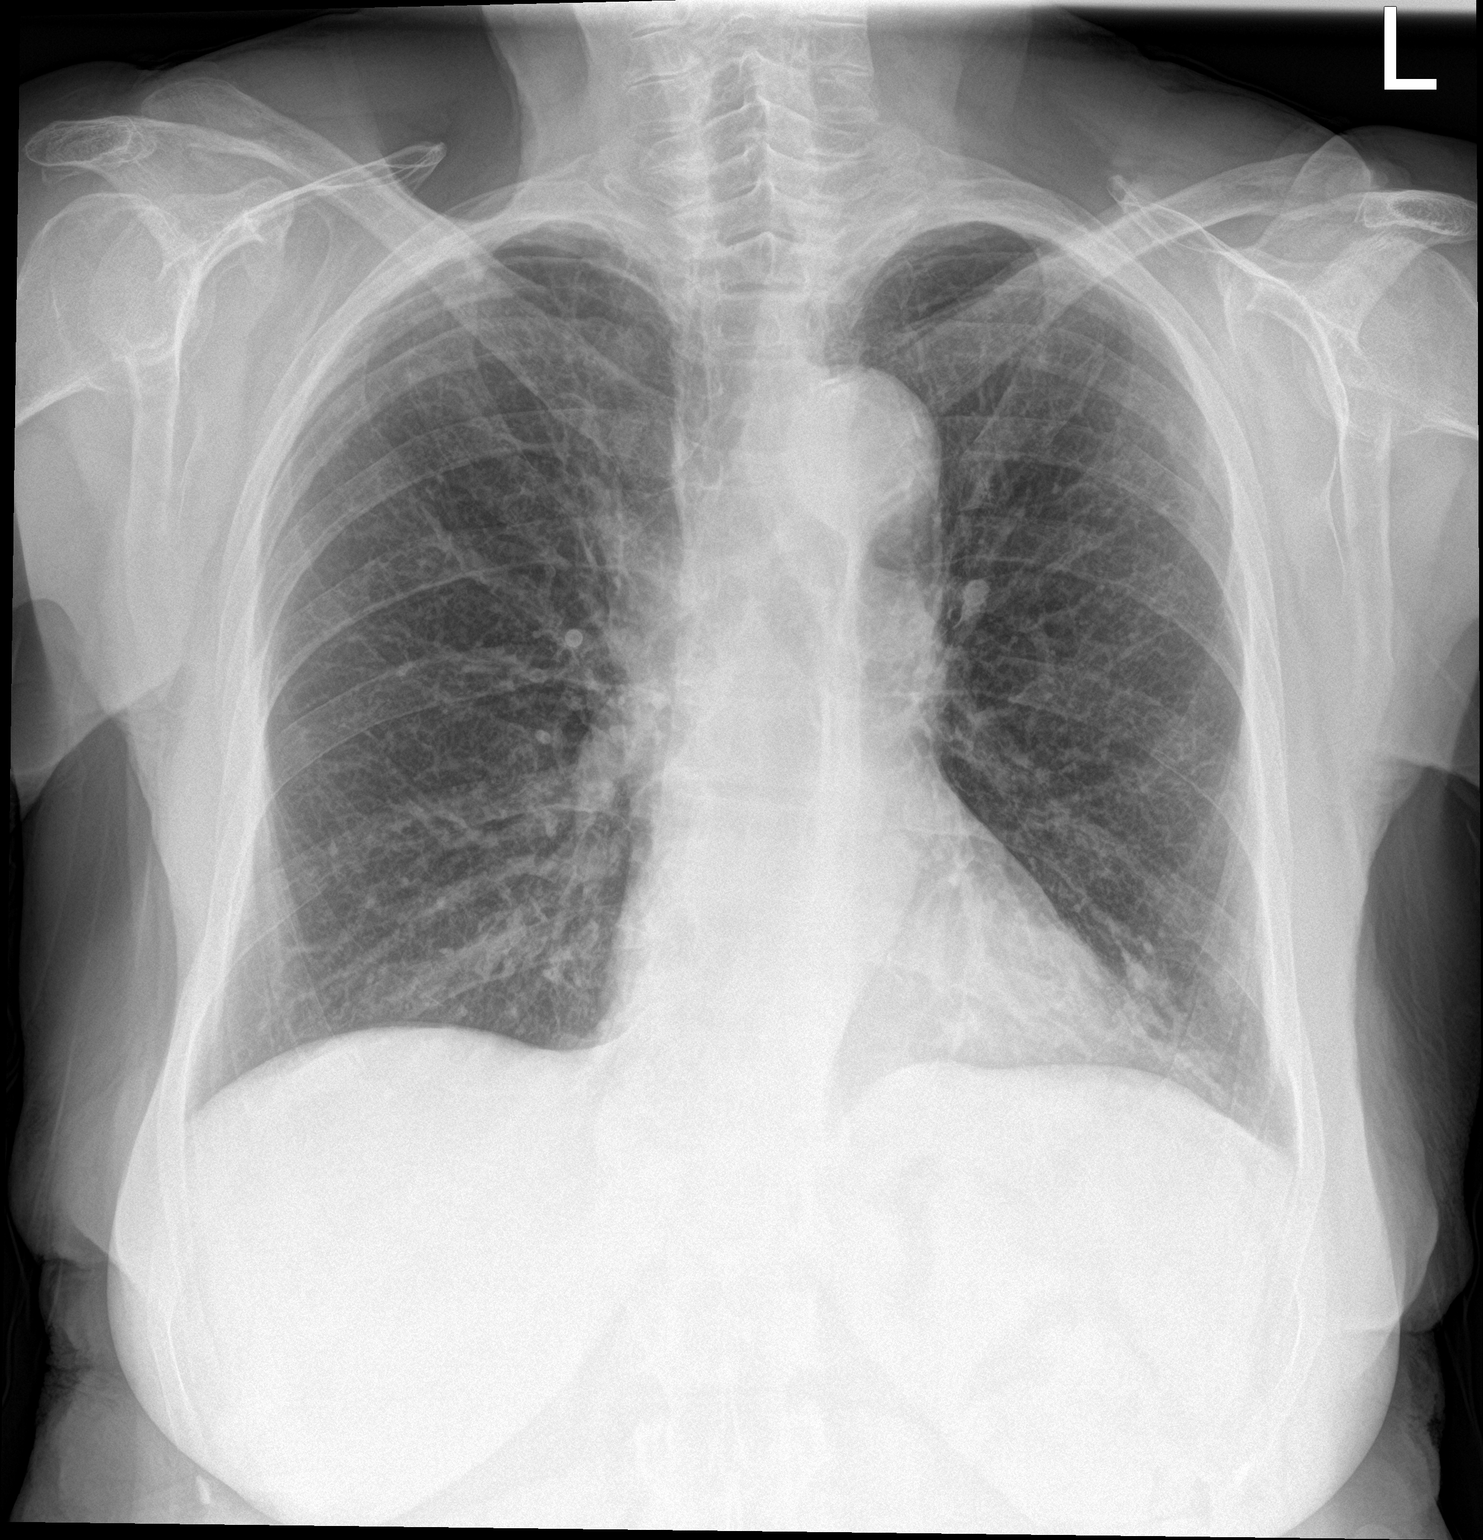

[chest lat]
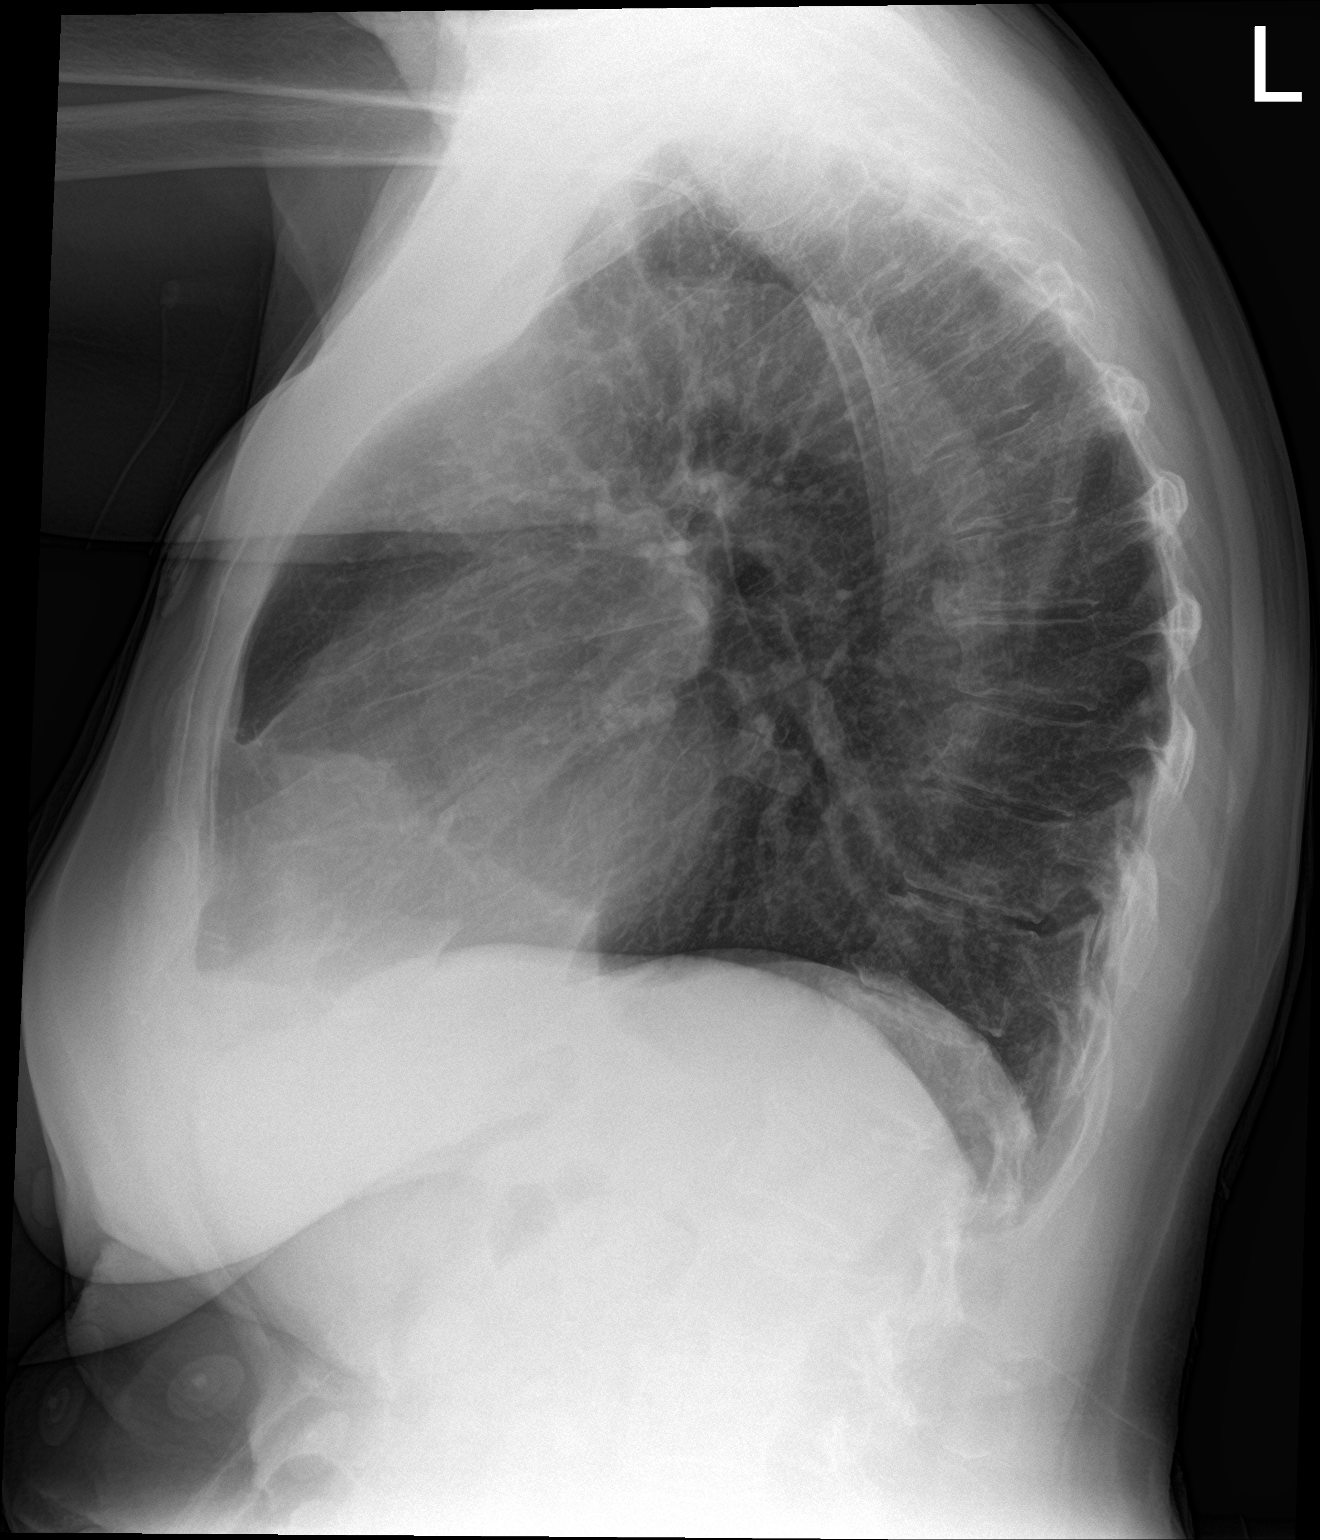

[2 of 2 positions shown; findings below may reference images not displayed]

FINDINGS: The lungs are symmetrically well expanded. Minimal left basilar
atelectasis or infiltrate. The lungs are otherwise clear. No
pneumothorax or pleural effusion. Cardiac size within normal limits.
The pulmonary vascularity is normal. No acute bone abnormality.
IMPRESSION: Mild left basilar atelectasis or infiltrate.

## 2021-09-02 ENCOUNTER — Ambulatory Visit: Payer: Medicare PPO | Admitting: Podiatry

## 2021-09-08 ENCOUNTER — Ambulatory Visit: Payer: Medicare PPO | Admitting: Obstetrics and Gynecology

## 2021-09-16 DIAGNOSIS — F411 Generalized anxiety disorder: Secondary | ICD-10-CM | POA: Diagnosis not present

## 2021-09-16 DIAGNOSIS — K200 Eosinophilic esophagitis: Secondary | ICD-10-CM | POA: Diagnosis not present

## 2021-09-16 DIAGNOSIS — M810 Age-related osteoporosis without current pathological fracture: Secondary | ICD-10-CM | POA: Diagnosis not present

## 2021-09-16 DIAGNOSIS — K219 Gastro-esophageal reflux disease without esophagitis: Secondary | ICD-10-CM | POA: Diagnosis not present

## 2021-09-16 DIAGNOSIS — Z0000 Encounter for general adult medical examination without abnormal findings: Secondary | ICD-10-CM | POA: Diagnosis not present

## 2021-09-16 DIAGNOSIS — Z79899 Other long term (current) drug therapy: Secondary | ICD-10-CM | POA: Diagnosis not present

## 2021-09-16 DIAGNOSIS — E785 Hyperlipidemia, unspecified: Secondary | ICD-10-CM | POA: Diagnosis not present

## 2021-09-16 DIAGNOSIS — Z1389 Encounter for screening for other disorder: Secondary | ICD-10-CM | POA: Diagnosis not present

## 2021-09-16 DIAGNOSIS — I700 Atherosclerosis of aorta: Secondary | ICD-10-CM | POA: Diagnosis not present

## 2021-09-16 DIAGNOSIS — E039 Hypothyroidism, unspecified: Secondary | ICD-10-CM | POA: Diagnosis not present

## 2021-10-08 DIAGNOSIS — G4733 Obstructive sleep apnea (adult) (pediatric): Secondary | ICD-10-CM | POA: Diagnosis not present

## 2021-10-08 DIAGNOSIS — I700 Atherosclerosis of aorta: Secondary | ICD-10-CM | POA: Diagnosis not present

## 2021-10-08 DIAGNOSIS — I480 Paroxysmal atrial fibrillation: Secondary | ICD-10-CM | POA: Diagnosis not present

## 2021-10-08 DIAGNOSIS — E039 Hypothyroidism, unspecified: Secondary | ICD-10-CM | POA: Diagnosis not present

## 2021-10-08 DIAGNOSIS — D6869 Other thrombophilia: Secondary | ICD-10-CM | POA: Diagnosis not present

## 2021-10-08 DIAGNOSIS — M810 Age-related osteoporosis without current pathological fracture: Secondary | ICD-10-CM | POA: Diagnosis not present

## 2021-10-08 DIAGNOSIS — H5789 Other specified disorders of eye and adnexa: Secondary | ICD-10-CM | POA: Diagnosis not present

## 2021-10-08 DIAGNOSIS — K200 Eosinophilic esophagitis: Secondary | ICD-10-CM | POA: Diagnosis not present

## 2021-10-08 DIAGNOSIS — E785 Hyperlipidemia, unspecified: Secondary | ICD-10-CM | POA: Diagnosis not present

## 2021-10-20 NOTE — Progress Notes (Unsigned)
81 y.o. G0P0 Married {Race/ethnicity:17218} female here for annual breast and pelvic exam.    PCP:     No LMP recorded (lmp unknown). Patient has had a hysterectomy.           Sexually active: {yes no:314532}  The current method of family planning is status post hysterectomy/BSO.    Exercising: {yes no:314532}  {types:19826} Smoker:  N/A  Health Maintenance: Pap:  2011 normal History of abnormal Pap:  no MMG:  03-06-21 Neg/BiRads1 Colonoscopy:  ***2020 BMD: ***05-01-19  Result  ***Osteopenia--Dr.Stoneking TDaP:  PCP 12-20-12 Gardasil:   n/a HIV:*** Hep C:*** Screening Labs:  Hb today: ***, Urine today: ***   reports that she quit smoking about 46 years ago. Her smoking use included cigarettes. She has a 3.75 pack-year smoking history. She has never used smokeless tobacco. She reports that she does not drink alcohol and does not use drugs.  Past Medical History:  Diagnosis Date   Allergy    Anal polyp 1998   Flex Sig    Anemia    Angular blepharitis of left eye    Ankle fracture    Stress fracture   Anxiety    Aortic atherosclerosis (HCC)    Asthma    border line has inhaler   Bunion    Cataracts, bilateral    Corneal scar    left eye   CPAP (continuous positive airway pressure) dependence    Degenerative disc disease    Depression    Diverticulosis of colon (without mention of hemorrhage) 2010   Colonoscopy   Dry eyes    bilateral   Enterocele    External hemorrhoids 2000   Colonoscopy   Family history of malignant neoplasm of gastrointestinal tract    Female cystocele    Fibromyalgia    GERD (gastroesophageal reflux disease)    Hiatal hernia 2005,2010   EGD   History of bronchitis    History of measles    History of mumps    History of strep sore throat    History of urinary tract infection    Hyperlipemia    Hypothyroidism    IBS (irritable bowel syndrome)    Imbalance    Internal hemorrhoids without mention of complication [id number]   Colonoscopy     Itching    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[place])    Menopause    Migraine    OSA (obstructive sleep apnea) 05/14/2015   on BiPAP   Osteopenia 10/2013   T score -1.6 FRAX 10%/1.6%   PAF (paroxysmal atrial fibrillation) (HCC)    Pancreatitis    PCO (posterior capsular opacification)    left   Pneumonia    childhood illness   PONV (postoperative nausea and vomiting)    Pseudophakia, both eyes    PVD (posterior vitreous detachment) right   Rash    on back    Retinal scar    left   Rotator cuff disorder    pain, left shoulder   Shingles    Stricture and stenosis of esophagus 2005,2010   EGD    Stroke ([place])    Ulcerative colitis ([place])    Varicose veins    Vertigo    Wears glasses     Past Surgical History:  Procedure Laterality Date   ABDOMINAL HYSTERECTOMY     ANAL FISSURE REPAIR     BREAST EXCISIONAL BIOPSY Left    BUNIONECTOMY  2014   CATARACT EXTRACTION Bilateral 2013   COLONOSCOPY     polyp removed  ESOPHAGEAL DILATION     EYE SURGERY Left    FOOT SURGERY     left    HEMORRHOID SURGERY     MOUTH SURGERY  11/13/11   Cyst removed from [place]  1970's   REPLACEMENT TOTAL KNEE Right 2011   TONSILLECTOMY     TOTAL ABDOMINAL HYSTERECTOMY W/ BILATERAL SALPINGOOPHORECTOMY  1991   TAH BSO   TOTAL HIP ARTHROPLASTY     right    TOTAL KNEE ARTHROPLASTY     both   TOTAL KNEE ARTHROPLASTY Left 06/23/2015   Procedure: LEFT TOTAL KNEE ARTHROPLASTY;  Surgeon: Gaynelle Arabian, MD;  Location: [facility];  Service: Orthopedics;  Laterality: Left;    Current Outpatient Medications  Medication Sig Dispense Refill   acetaminophen (TYLENOL) 500 MG tablet Take 500 mg by mouth every 6 (six) hours as needed for moderate pain.      albuterol (VENTOLIN HFA) 108 (90 Base) MCG/ACT inhaler Inhale 2 puffs into the lungs every 6 (six) hours as needed for wheezing or shortness of breath. 18 g 3   apixaban (ELIQUIS) 5 MG TABS tablet Take 1 tablet (5 mg  total) by mouth 2 (two) times daily. 180 tablet 2   azelastine (ASTELIN) 0.1 % nasal spray Place 2 sprays into both nostrils 2 (two) times daily as needed for rhinitis. Use in each nostril as directed 30 mL 5   bisacodyl (DULCOLAX) 5 MG EC tablet Take 5 mg by mouth daily as needed for moderate constipation.     CALCIUM PO Taking 2 chewables by mouth daily- '500mg'$      Cholecalciferol 100 MCG (4000 UT) TABS Take by mouth.     cycloSPORINE (RESTASIS) 0.05 % ophthalmic emulsion Place 1 drop into both eyes 2 (two) times daily.     diltiazem (CARDIZEM CD) 240 MG 24 hr capsule Take 1 capsule (240 mg total) by mouth daily. 90 capsule 3   EPINEPHrine 0.3 mg/0.3 mL IJ SOAJ injection Inject 0.3 mg into the muscle as needed (for allergic reaction). Reported on 05/28/2015 2 each 2   famotidine (PEPCID) 20 MG tablet Take 20 mg by mouth 2 (two) times daily. OTC medication     fluticasone (FLONASE) 50 MCG/ACT nasal spray Place 2 sprays into both nostrils 2 (two) times daily as needed for allergies. 16 g 5   levothyroxine (SYNTHROID) 100 MCG tablet Take 100 mcg by mouth daily before breakfast.     meclizine (ANTIVERT) 12.5 MG tablet Take 2 tablets by mouth as directed.     montelukast (SINGULAIR) 10 MG tablet Take 1 tablet (10 mg total) by mouth at bedtime. 30 tablet 5   ondansetron (ZOFRAN ODT) 4 MG disintegrating tablet Take 1 tablet (4 mg total) by mouth every 6 (six) hours as needed. 20 tablet 0   Polyethyl Glycol-Propyl Glycol (SYSTANE) 0.4-0.3 % SOLN Apply 2 drops to eye daily as needed. 30 mL 5   PROAIR HFA 108 (90 Base) MCG/ACT inhaler Inhale 1-2 puffs into the lungs every 6 (six) hours as needed for wheezing. 18 g 3   Probiotic Product (ALIGN) 4 MG CAPS Take 1 capsule by mouth daily.     psyllium (METAMUCIL) 58.6 % powder Take 1 packet by mouth daily as needed (constipation).     rosuvastatin (CRESTOR) 10 MG tablet Take 5 mg by mouth at bedtime.  4   No current facility-administered medications for this  visit.    Family History  Problem Relation Age of Onset   Dementia Mother    Irritable bowel syndrome Mother    Alzheimer's disease Mother    Stroke Mother    Hypertension Father    Congestive Heart Failure Father    Alcohol abuse Father    Diabetes Sister    Allergy (severe) Sister    Colon cancer Paternal Aunt    Heart disease Maternal Grandmother    Heart attack Maternal Grandmother    Heart attack Paternal Grandfather    Heart attack Maternal Grandfather    Arthritis Brother    Colon polyps Brother    Other Sister        pituitary disease   Allergies Sister    Other Sister        joint problems   Breast cancer Sister 16   Colon polyps Sister    Esophageal cancer Neg Hx    Rectal cancer Neg Hx    Stomach cancer Neg Hx     Review of Systems  Exam:   LMP  (LMP Unknown)     General appearance: alert, cooperative and appears stated age Head: normocephalic, without obvious abnormality, atraumatic Neck: no adenopathy, supple, symmetrical, trachea midline and thyroid normal to inspection and palpation Lungs: clear to auscultation bilaterally Breasts: normal appearance, no masses or tenderness, No nipple retraction or dimpling, No nipple discharge or bleeding, No axillary adenopathy Heart: regular rate and rhythm Abdomen: soft, non-tender; no masses, no organomegaly Extremities: extremities normal, atraumatic, no cyanosis or edema Skin: skin color, texture, turgor normal. No rashes or lesions Lymph nodes: cervical, supraclavicular, and axillary nodes normal. Neurologic: grossly normal  Pelvic: External genitalia:  no lesions              No abnormal inguinal nodes palpated.              Urethra:  normal appearing urethra with no masses, tenderness or lesions              Bartholins and Skenes: normal                 Vagina: normal appearing vagina with normal color and discharge, no lesions              Cervix: no lesions              Pap taken: {yes  no:314532} Bimanual Exam:  Uterus:  normal size, contour, position, consistency, mobility, non-tender              Adnexa: no mass, fullness, tenderness              Rectal exam: {yes no:314532}.  Confirms.              Anus:  normal sphincter tone, no lesions  Chaperone was present for exam:  ***  Assessment:   Well woman visit with gynecologic exam.   Plan: Mammogram screening discussed. Self breast awareness reviewed. Pap and HR HPV as above. Guidelines for Calcium, Vitamin D, regular exercise program including cardiovascular and weight bearing exercise.   Follow up annually and prn.   Additional counseling given.  {yes [id number]. _______ minutes face to face time of which over 50% was spent in counseling.    After visit summary provided.

## 2021-10-22 ENCOUNTER — Ambulatory Visit (INDEPENDENT_AMBULATORY_CARE_PROVIDER_SITE_OTHER): Payer: Medicare PPO | Admitting: Obstetrics and Gynecology

## 2021-10-22 ENCOUNTER — Encounter: Payer: Self-pay | Admitting: Obstetrics and Gynecology

## 2021-10-22 VITALS — BP 132/78 | HR 67 | Ht 65.5 in | Wt 166.0 lb

## 2021-10-22 DIAGNOSIS — K5900 Constipation, unspecified: Secondary | ICD-10-CM | POA: Diagnosis not present

## 2021-10-22 DIAGNOSIS — N8110 Cystocele, unspecified: Secondary | ICD-10-CM | POA: Diagnosis not present

## 2021-10-22 DIAGNOSIS — N993 Prolapse of vaginal vault after hysterectomy: Secondary | ICD-10-CM | POA: Diagnosis not present

## 2021-10-22 DIAGNOSIS — Z01419 Encounter for gynecological examination (general) (routine) without abnormal findings: Secondary | ICD-10-CM | POA: Diagnosis not present

## 2021-10-22 NOTE — Patient Instructions (Addendum)
For your constipation, you may take Colace stool softener at night and Miralax powder daily in the morning.   EXERCISE AND DIET:  We recommended that you start or continue a regular exercise program for good health. Regular exercise means any activity that makes your heart beat faster and makes you sweat.  We recommend exercising at least 30 minutes per day at least 3 days a week, preferably 4 or 5.  We also recommend a diet low in fat and sugar.  Inactivity, poor dietary choices and obesity can cause diabetes, heart attack, stroke, and kidney damage, among others.    ALCOHOL AND SMOKING:  Women should limit their alcohol intake to no more than 7 drinks/beers/glasses of wine (combined, not each!) per week. Moderation of alcohol intake to this level decreases your risk of breast cancer and liver damage. And of course, no recreational drugs are part of a healthy lifestyle.  And absolutely no smoking or even second hand smoke. Most people know smoking can cause heart and lung diseases, but did you know it also contributes to weakening of your bones? Aging of your skin?  Yellowing of your teeth and nails?  CALCIUM AND VITAMIN D:  Adequate intake of calcium and Vitamin D are recommended.  The recommendations for exact amounts of these supplements seem to change often, but generally speaking 600 mg of calcium (either carbonate or citrate) and 800 units of Vitamin D per day seems prudent. Certain women may benefit from higher intake of Vitamin D.  If you are among these women, your doctor will have told you during your visit.    PAP SMEARS:  Pap smears, to check for cervical cancer or precancers,  have traditionally been done yearly, although recent scientific advances have shown that most women can have pap smears less often.  However, every woman still should have a physical exam from her gynecologist every year. It will include a breast check, inspection of the vulva and vagina to check for abnormal growths or  skin changes, a visual exam of the cervix, and then an exam to evaluate the size and shape of the uterus and ovaries.  And after 81 years of age, a rectal exam is indicated to check for rectal cancers. We will also provide age appropriate advice regarding health maintenance, like when you should have certain vaccines, screening for sexually transmitted diseases, bone density testing, colonoscopy, mammograms, etc.   MAMMOGRAMS:  All women over 87 years old should have a yearly mammogram. Many facilities now offer a "3D" mammogram, which may cost around $50 extra out of pocket. If possible,  we recommend you accept the option to have the 3D mammogram performed.  It both reduces the number of women who will be called back for extra views which then turn out to be normal, and it is better than the routine mammogram at detecting truly abnormal areas.    COLONOSCOPY:  Colonoscopy to screen for colon cancer is recommended for all women at age 39.  We know, you hate the idea of the prep.  We agree, BUT, having colon cancer and not knowing it is worse!!  Colon cancer so often starts as a polyp that can be seen and removed at colonscopy, which can quite literally save your life!  And if your first colonoscopy is normal and you have no family history of colon cancer, most women don't have to have it again for 10 years.  Once every ten years, you can do something that may end  up saving your life, right?  We will be happy to help you get it scheduled when you are ready.  Be sure to check your insurance coverage so you understand how much it will cost.  It may be covered as a preventative service at no cost, but you should check your particular policy.    Calcium Content in Foods Calcium is the most abundant mineral in the body. Most of the body's calcium supply is stored in bones and teeth. Calcium helps many parts of the body function normally, including: Blood and blood vessels. Nerves. Hormones. Muscles. Bones and  teeth. When your calcium stores are low, you may be at risk for low bone mass, bone loss, and broken bones (fractures). When you get enough calcium, it helps to support strong bones and teeth throughout your life. Calcium is especially important for: Children during growth spurts. Girls during adolescence. Women who are pregnant or breastfeeding. Women after their menstrual cycle stops (postmenopause). Women whose menstrual cycle has stopped due to anorexia nervosa or regular intense exercise. People who cannot eat or digest dairy products. Vegans. Recommended daily amounts of calcium: Women (ages 59 to 9): 1,000 mg per day. Women (ages 60 and older): 1,200 mg per day. Men (ages 17 to 65): 1,000 mg per day. Men (ages 34 and older): 1,200 mg per day. Women (ages 45 to 68): 1,300 mg per day. Men (ages 88 to 66): 1,300 mg per day. General information Eat foods that are high in calcium. Try to get most of your calcium from food. Some people may benefit from taking calcium supplements. Check with your health care provider or diet and nutrition specialist (dietitian) before starting any calcium supplements. Calcium supplements may interact with certain medicines. Too much calcium may cause other health problems, such as constipation and kidney stones. For the body to absorb calcium, it needs vitamin D. Sources of vitamin D include: Skin exposure to direct sunlight. Foods, such as egg yolks, liver, mushrooms, saltwater fish, and fortified milk. Vitamin D supplements. Check with your health care provider or dietitian before starting any vitamin D supplements. What foods are high in calcium?  Foods that are high in calcium contain more than 100 milligrams per serving. Fruits Fortified orange juice or other fruit juice, 300 mg per 8 oz serving. Vegetables Collard greens, 360 mg per 8 oz serving. Kale, 100 mg per 8 oz serving. Bok choy, 160 mg per 8 oz serving. Grains Fortified ready-to-eat  cereals, 100 to 1,000 mg per 8 oz serving. Fortified frozen waffles, 200 mg in 2 waffles. Oatmeal, 140 mg in 1 cup. Meats and other proteins Sardines, canned with bones, 325 mg per 3 oz serving. Salmon, canned with bones, 180 mg per 3 oz serving. Canned shrimp, 125 mg per 3 oz serving. Baked beans, 160 mg per 4 oz serving. Tofu, firm, made with calcium sulfate, 253 mg per 4 oz serving. Dairy Yogurt, plain, low-fat, 310 mg per 6 oz serving. Nonfat milk, 300 mg per 8 oz serving. American cheese, 195 mg per 1 oz serving. Cheddar cheese, 205 mg per 1 oz serving. Cottage cheese 2%, 105 mg per 4 oz serving. Fortified soy, rice, or almond milk, 300 mg per 8 oz serving. Mozzarella, part skim, 210 mg per 1 oz serving. The items listed above may not be a complete list of foods high in calcium. Actual amounts of calcium may be different depending on processing. Contact a dietitian for more information. What foods are lower in calcium? Foods  that are lower in calcium contain 50 mg or less per serving. Fruits Apple, about 6 mg. Banana, about 12 mg. Vegetables Lettuce, 19 mg per 2 oz serving. Tomato, about 11 mg. Grains Rice, 4 mg per 6 oz serving. Boiled potatoes, 14 mg per 8 oz serving. White bread, 6 mg per slice. Meats and other proteins Egg, 27 mg per 2 oz serving. Red meat, 7 mg per 4 oz serving. Chicken, 17 mg per 4 oz serving. Fish, cod, or trout, 20 mg per 4 oz serving. Dairy Cream cheese, regular, 14 mg per 1 Tbsp serving. Brie cheese, 50 mg per 1 oz serving. Parmesan cheese, 70 mg per 1 Tbsp serving. The items listed above may not be a complete list of foods lower in calcium. Actual amounts of calcium may be different depending on processing. Contact a dietitian for more information. Summary Calcium is an important mineral in the body because it affects many functions. Getting enough calcium helps support strong bones and teeth throughout your life. Try to get most of your  calcium from food. Calcium supplements may interact with certain medicines. Check with your health care provider or dietitian before starting any calcium supplements. This information is not intended to replace advice given to you by your health care provider. Make sure you discuss any questions you have with your health care provider. Document Revised: 09/12/2019 Document Reviewed: 09/12/2019 Elsevier Patient Education  [place].

## 2021-11-02 NOTE — Therapy (Signed)
OUTPATIENT PHYSICAL THERAPY VESTIBULAR EVALUATION     Patient Name: Kimberly [name] MRN: [id number] DOB:1941-02-19, 81 y.o., female Today's Date: 11/04/2021  PCP: Lajean Manes, MD  REFERRING PROVIDER: Lajean Manes, MD    PT End of Session - 11/04/21 1154     Visit Number 1    Number of Visits 9    Date for PT Re-Evaluation 12/02/21    Authorization Type [employer]    PT Start Time 0933    PT Stop Time 1013    PT Time Calculation (min) 40 min    Activity Tolerance Patient tolerated treatment well    Behavior During Therapy WFL for tasks assessed/performed             Past Medical History:  Diagnosis Date   Allergy    Anal polyp 1998   Flex Sig    Anemia    Angular blepharitis of left eye    Ankle fracture    Stress fracture   Anxiety    Aortic atherosclerosis ([place])    Asthma    border line has inhaler   Bunion    Cataracts, bilateral    Corneal scar    left eye   CPAP (continuous positive airway pressure) dependence    Degenerative disc disease    Depression    Diverticulosis of colon (without mention of hemorrhage) 2010   Colonoscopy   Dry eyes    bilateral   Enterocele    External hemorrhoids 2000   Colonoscopy   Family history of malignant neoplasm of gastrointestinal tract    Female cystocele    Fibromyalgia    GERD (gastroesophageal reflux disease)    Hiatal hernia 2005,2010   EGD   History of bronchitis    History of measles    History of mumps    History of strep sore throat    History of urinary tract infection    Hyperlipemia    Hypothyroidism    IBS (irritable bowel syndrome)    Imbalance    Internal hemorrhoids without mention of complication 9024,0973   Colonoscopy    Itching    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[place])    Menopause    Migraine    OSA (obstructive sleep apnea) 05/14/2015   on BiPAP   Osteopenia 10/2013   T score -1.6 FRAX 10%/1.6%   PAF (paroxysmal atrial fibrillation) (HCC)    Pancreatitis    PCO (posterior  capsular opacification)    left   Pneumonia    childhood illness   PONV (postoperative nausea and vomiting)    Pseudophakia, both eyes    PVD (posterior vitreous detachment) right   Rash    on back    Retinal scar    left   Rotator cuff disorder    pain, left shoulder   Shingles    Stricture and stenosis of esophagus 2005,2010   EGD    Stroke ([place])    Ulcerative colitis ([place])    Varicose veins    Vertigo    Wears glasses    Past Surgical History:  Procedure Laterality Date   ABDOMINAL HYSTERECTOMY     ANAL FISSURE REPAIR     BREAST EXCISIONAL BIOPSY Left    BUNIONECTOMY  2014   CATARACT EXTRACTION Bilateral 2013   COLONOSCOPY     polyp removed   ESOPHAGEAL DILATION     EYE SURGERY Left    FOOT SURGERY     left    HEMORRHOID SURGERY     MOUTH  SURGERY  11/13/11   Cyst removed from [place]  1970's   REPLACEMENT TOTAL KNEE Right 2011   TONSILLECTOMY     TOTAL ABDOMINAL HYSTERECTOMY W/ BILATERAL SALPINGOOPHORECTOMY  1991   TAH BSO   TOTAL HIP ARTHROPLASTY     right    TOTAL KNEE ARTHROPLASTY     both   TOTAL KNEE ARTHROPLASTY Left 06/23/2015   Procedure: LEFT TOTAL KNEE ARTHROPLASTY;  Surgeon: Gaynelle Arabian, MD;  Location: [facility];  Service: Orthopedics;  Laterality: Left;   Patient Active Problem List   Diagnosis Date Noted   Elevated blood-pressure reading, without diagnosis of hypertension 03/13/2021   Microscopic hematuria 11/26/2020   Chronic pain 16/03/9603   Eosinophilic esophagitis 54/01/8118   Generalized anxiety disorder 11/10/2020   Hardening of the aorta (main artery of the heart) ([place]) 11/10/2020   Osteoporosis 11/10/2020   Spondylolisthesis at L4-L5 level 11/10/2020   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   Corns and callosities 05/20/2019   Pain in both feet 05/20/2019   Chest pain 01/28/2019   Chest pain of uncertain etiology 14/78/2956   Adiposity 07/13/2018   Excess weight 07/13/2018   Hay fever  07/13/2018   PAF (paroxysmal atrial fibrillation) (HCC) 08/25/2017   Neck pain 12/30/2016   Chronic low back pain 08/12/2016   Syncope 05/13/2016   Heart palpitations 05/13/2016   Rhinitis, chronic 02/04/2016   IBS (irritable bowel syndrome) 07/14/2015   Nausea without vomiting 07/14/2015   OA (osteoarthritis) of knee 06/23/2015   OSA (obstructive sleep apnea) 05/14/2015   Acute back pain with sciatica 04/03/2015   Preoperative clearance 01/08/2015   Acute infection of nasal sinus 02/25/2014   Pancreatitis 07/22/2013   Leukocytosis, unspecified 07/22/2013   Amblyopia 03/07/2013   Retinal scar 03/07/2013   Cellulitis 01/15/2013   Cerebrovascular small vessel disease 11/08/2012   Angular blepharitis 10/11/2012   Dry eyes 10/11/2012   PCO (posterior capsular opacification) 10/11/2012   PVD (posterior vitreous detachment) 10/11/2012   Crossover toe 09/20/2012   Cataracts, bilateral    Elevated cholesterol    Degenerative disc disease    Bunion    Ankle fracture    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[place])    Hemorrhoid    DUB (dysfunctional uterine bleeding)    DIARRHEA 09/16/2009   PERSONAL HX COLONIC POLYPS 09/16/2009   LACUNAR INFARCTION 07/16/2008   CONSTIPATION 07/12/2008   CHEST PAIN 07/12/2008   DYSPHAGIA 07/12/2008   Hypothyroidism 07/10/2008   Anxiety state 07/10/2008   DEPRESSION 07/10/2008   INTERNAL HEMORRHOIDS 07/10/2008   ESOPHAGEAL STRICTURE 07/10/2008   GERD 07/10/2008   HIATAL HERNIA 07/10/2008   DIVERTICULOSIS, COLON 07/10/2008   IRRITABLE BOWEL SYNDROME 07/10/2008   Osteoarthritis 07/10/2008   FIBROMYALGIA 07/10/2008   Blues 07/10/2008    ONSET DATE: Spring 2023  REFERRING DIAG: R42 (ICD-10-CM) - Dizziness and giddiness   THERAPY DIAG:  Dizziness and giddiness  Unsteadiness on feet  Rationale for Evaluation and Treatment Rehabilitation  SUBJECTIVE:   SUBJECTIVE STATEMENT: Patient reports dizziness starting in the spring, around the time when her allergies  got bad. Dizziness is described as "spinning" and lasts seconds. Worse when laying back at the beauty shop, laying flat. Also notes some neck stiffness. Denies head trauma, infection/illness, vision changes/double vision, hearing loss, otalgia, photo/phonophobia. Notes tinnitus, hx of migraines but has not had one for many years. Reports stiffness in the L side of the neck which does radiate to the head occasionally. Reports lots  of stress with her husband being ill recently.    Pt accompanied by: self  PERTINENT HISTORY: anemia, hx ankle fx, anxiety, asthma, depression, fibromyalgia, GERD, HLD, lacunar stroke, migraine, osteopenia, a-fib, ulcerative colitis, bunionectomy 2014, R/L TKA 2011/2017, R THA   PAIN:  Are you having pain? No  PRECAUTIONS: Fall  WEIGHT BEARING RESTRICTIONS No  FALLS: Has patient fallen in last 6 months? No  LIVING ENVIRONMENT: Lives with: lives with their spouse; caregiver for husband Lives in: House/apartment Stairs: Yes: Internal: 0 steps; none and External: 2+1 steps; can reach both Has following equipment at home: Single point cane and Walker - 2 wheeled  PLOF: Independent  PATIENT GOALS improve dizziness  OBJECTIVE:   DIAGNOSTIC FINDINGS: none recent  COGNITION: Overall cognitive status: Within functional limits for tasks assessed   SENSATION: Reports tingling in the L heel and up the lower leg- occurring for the past couple of months    POSTURE: rounded shoulders and forward head   GAIT: Gait pattern: will assess next session Assistive device utilized: None Level of assistance: Complete Independence    PATIENT SURVEYS:  FOTO 53.95   VESTIBULAR ASSESSMENT   GENERAL OBSERVATION: L eye droop- 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; pt wearing distance glasses    OCULOMOTOR EXAM:   Ocular Alignment: normal   Ocular ROM: No Limitations   Spontaneous Nystagmus: absent   Gaze-Induced Nystagmus: absent   Smooth  Pursuits: saccades and in vertical and horizontal directions   Saccades: intact   Convergence/Divergence: c/o blurring ~6 inches    VESTIBULAR - OCULAR REFLEX:    Slow VOR: Comment: trouble maintaining gaze fixation in horizontal and vertical directions; c/o diplopia with head extended   VOR Cancellation: Comment: difficulty coordinating; c/o neck stiffness   Head-Impulse Test: HIT Right: negative HIT Left: negative; limited by limited cervical ROM       POSITIONAL TESTING:  NT     VESTIBULAR TREATMENT:   PATIENT EDUCATION: Education details: prognosis, POC, HEP, answering patient's questions Person educated: Patient Education method: Explanation, Demonstration, Tactile cues, Verbal cues, and Handouts Education comprehension: verbalized understanding and returned demonstration  Access Code: [email or web address] URL: [email or web address] Date: 11/04/2021 Prepared by: [facility]  Exercises - Seated Gaze Stabilization with Head Rotation  - 1 x daily - 5 x weekly - 2-3 sets - 30 sec hold - Seated Gaze Stabilization with Head Nod  - 1 x daily - 5 x weekly - 2-3 sets - 30 sec hold   GOALS: Goals reviewed with patient? Yes  SHORT TERM GOALS: Target date: 11/18/2021  Patient to be independent with initial HEP. Baseline: HEP initiated Goal status: INITIAL    LONG TERM GOALS: Target date: 12/02/2021  Patient to be independent with advanced HEP. Baseline: Not yet initiated  Goal status: INITIAL  Patient to report 0/10 dizziness with standing vertical and horizontal VOR for 30 seconds. Baseline: NT Goal status: INITIAL  Patient will report 0/10 dizziness with bed mobility.  Baseline: Symptomatic  Goal status: INITIAL  Patient to demonstrate mild-moderate sway with M-CTSIB condition with eyes closed/foam surface in order to improve safety in environments with uneven surfaces and dim lighting. Baseline: NT Goal status:  INITIAL  Patient to score at least 20/24 on DGI in order to decrease risk of falls. Baseline: NT Goal status: INITIAL    ASSESSMENT:  CLINICAL IMPRESSION:   Patient is a 81 y/o F presenting to OPPT with c/o dizziness for  the past 3-4 months. Dizziness is described as "spinning" and lasts seconds. Worse when laying back at the beauty shop, laying flat. Reports neck stiffness/pain, tinnitus, hx of migraines but has not had one for many years. Denies head trauma, infection/illness, vision changes/double vision, hearing loss, otalgia, photo/phonophobia. Patient today presented with forward head posture, saccades with vertical and horizontal smooth pursuits, convergence insufficiency, difficulty with gaze fixation with vertical and horizontal slow VOR and c/o diplopia with VOR while head extended.  Positional testing not done d/t limited time. Patient was educated on gentle VOR HEP and reported understanding. Would benefit from skilled PT services 1-2x/week for 4 weeks to address aforementioned impairments in order to optimize level of function.     OBJECTIVE IMPAIRMENTS decreased balance, dizziness, increased muscle spasms, impaired flexibility, postural dysfunction, and pain.   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   REHAB POTENTIAL: Good  CLINICAL DECISION MAKING: Evolving/moderate complexity  EVALUATION COMPLEXITY: Moderate   PLAN: PT FREQUENCY: 1-2x/week  PT DURATION: 4  weeks  PLANNED INTERVENTIONS: Therapeutic exercises, Therapeutic activity, Neuromuscular re-education, Balance training, Gait training, Patient/Family education, Joint mobilization, Stair training, Vestibular training, Canalith repositioning, Dry Needling, Electrical stimulation, Cryotherapy, Moist heat, Taping, Manual therapy, and Re-evaluation  PLAN FOR NEXT SESSION: positional testing, DGI, M-CTSIB, progress VOR training    Janene Harvey, PT, DPT 11/04/21 12:05 PM  [facility] at [facility] [address] [place], [place] [address] Phone # [phone] Fax # [phone]

## 2021-11-03 ENCOUNTER — Ambulatory Visit: Payer: Medicare PPO | Admitting: Podiatry

## 2021-11-03 DIAGNOSIS — M79675 Pain in left toe(s): Secondary | ICD-10-CM | POA: Diagnosis not present

## 2021-11-03 DIAGNOSIS — M79674 Pain in right toe(s): Secondary | ICD-10-CM | POA: Diagnosis not present

## 2021-11-03 DIAGNOSIS — D689 Coagulation defect, unspecified: Secondary | ICD-10-CM

## 2021-11-03 DIAGNOSIS — B351 Tinea unguium: Secondary | ICD-10-CM

## 2021-11-03 DIAGNOSIS — L84 Corns and callosities: Secondary | ICD-10-CM | POA: Diagnosis not present

## 2021-11-03 DIAGNOSIS — D6859 Other primary thrombophilia: Secondary | ICD-10-CM | POA: Diagnosis not present

## 2021-11-03 DIAGNOSIS — Q828 Other specified congenital malformations of skin: Secondary | ICD-10-CM | POA: Diagnosis not present

## 2021-11-04 ENCOUNTER — Ambulatory Visit: Payer: Medicare PPO | Attending: Geriatric Medicine | Admitting: Physical Therapy

## 2021-11-04 ENCOUNTER — Encounter: Payer: Self-pay | Admitting: Physical Therapy

## 2021-11-04 DIAGNOSIS — R42 Dizziness and giddiness: Secondary | ICD-10-CM | POA: Insufficient documentation

## 2021-11-04 DIAGNOSIS — R2681 Unsteadiness on feet: Secondary | ICD-10-CM | POA: Insufficient documentation

## 2021-11-08 ENCOUNTER — Encounter: Payer: Self-pay | Admitting: Podiatry

## 2021-11-08 DIAGNOSIS — H9313 Tinnitus, bilateral: Secondary | ICD-10-CM | POA: Insufficient documentation

## 2021-11-08 NOTE — Progress Notes (Signed)
Subjective:  Patient ID: Kimberly [name], female    DOB: 1940/09/06,  MRN: [id number]  Kimberly [name] presents to clinic today for at risk foot care with h/o clotting disorder and callus(es) b/l lower extremities, porokeratotic lesion(s) right lower extremity, and painful mycotic nails. Painful toenails interfere with ambulation. Aggravating factors include wearing enclosed shoe gear. Pain is relieved with periodic professional debridement. Painful callus(es) and porokeratotic lesion(s) are aggravated when weightbearing with and without shoegear. Pain is relieved with periodic professional debridement.  Patient is accompanied by her husband on today's visit.   She states her right great toe is sore on today. She has been using cotton pad for daily protection.  New problem(s): None.   PCP is Lajean Manes, MD , and last visit was Oct 07, 2021.  Allergies  Allergen Reactions   Amoxicillin-Pot Clavulanate Nausea Only, Swelling and Other (See Comments)    Has patient had a PCN reaction causing immediate rash, facial/tongue/throat swelling, SOB or lightheadedness with hypotension: No Has patient had a PCN reaction causing severe rash involving mucus membranes or skin necrosis: No Has patient had a PCN reaction that required hospitalization No Has patient had a PCN reaction occurring within the last 10 years: No If all of the above answers are "NO", then may proceed with Cephalosporin use.  Other reaction(s): nausea   Azithromycin Other (See Comments)    Pancreatitis Other reaction(s): Other, pancreatitis   Erythromycin Hives and Other (See Comments)    Reaction 1 time, when she was younger. Reaction 1 time, when she was younger.  Other reaction(s): hives   Almond Oil Other (See Comments)    Almonds: per allergy testing.   Amoxicillin Nausea Only   Atorvastatin Other (See Comments)    Muscle weakness  Muscle weakness  Other reaction(s): muscle weakness, Other    Atorvastatin Calcium Other (See Comments)   Clavulanic Acid Nausea Only   Codeine Nausea Only and Other (See Comments)    Other reaction(s): Nausea/Vomiting   Ketoconazole Nausea And Vomiting   Ketoconazole Nausea Only, Nausea And Vomiting and Other (See Comments)    Other reaction(s): Unknown   Latex Other (See Comments)    Other   Lincomycin     Stomach upset    Morphine Nausea Only    Other reaction(s): Unknown   Morphine Sulfate Nausea Only and Other (See Comments)      Unknown   Morphine Sulfate     Other reaction(s): Unknown   Other Other (See Comments)    Shellfish mix: per allergy testing. Other reaction(s): nausea   Shellfish Allergy Other (See Comments)    Shellfish mix: per allergy testing.   Simvastatin Other (See Comments)    Muscle pain Other reaction(s): muscle pain, Other   Sulfamethoxazole-Trimethoprim Nausea Only    Upset stomach    Clindamycin Rash   Clindamycin/Lincomycin Nausea And Vomiting and Rash   Erythromycin Base Rash    Review of Systems: Negative except as noted in the HPI.  Objective: No changes noted in today's physical examination.  General: Patient is a pleasant 81 y.o. Caucasian female WD, WN in NAD. AAO x 3.   Neurovascular Examination: CFT immediate b/l LE. Palpable DP/PT pulses b/l LE. Digital hair sparse b/l. Skin temperature gradient WNL b/l. No pain with calf compression b/l. No edema noted b/l. No cyanosis or clubbing noted b/l LE.  Protective sensation intact 5/5 intact bilaterally with 10g monofilament b/l. Vibratory sensation intact b/l. Proprioception intact bilaterally.  Dermatological:  Pedal skin with  normal turgor, texture and tone b/l lower extremities. No open wounds b/l LE. No interdigital macerations noted b/l LE. Toenails 1-5 b/l elongated, discolored, dystrophic, thickened, crumbly with subungual debris and tenderness to dorsal palpation. Hyperkeratotic lesion(s) submet head 1 bilaterally.  No erythema, no edema, no  drainage, no fluctuance. Porokeratotic lesion(s) lateral aspect R hallux and submet head 5 right. No erythema, no edema, no drainage, no fluctuance.  Musculoskeletal:  Muscle strength 5/5 to all lower extremity muscle groups bilaterally. HAV with bunion deformity noted b/l LE. Hammertoe deformity noted 2-5 b/l.  Assessment/Plan: 1. Pain due to onychomycosis of toenails of both feet   2. Porokeratosis   3. Callus   4. Clotting disorder ([place])   5. Thrombophilia ([place])     -Patient was evaluated and treated. All patient's and/or POA's questions/concerns answered on today's visit. -Continue padding to right hallux for daily protection/prevention of wound formation. -Patient to continue soft, supportive shoe gear daily. -Mycotic toenails 1-5 bilaterally were debrided in length and girth with sterile nail nippers and dremel without incident. -Callus(es) submet head 1 b/l pared utilizing sterile scalpel blade without complication or incident. Total number debrided =2. -Porokeratotic lesion(s) R hallux and submet head 5 right foot pared and enucleated with sterile scalpel blade without incident. Total number of lesions debrided=2. -Patient/POA to call should there be question/concern in the interim.   Return in about 3 months (around 02/03/2022).  Marzetta Board, DPM

## 2021-11-09 DIAGNOSIS — Z85828 Personal history of other malignant neoplasm of skin: Secondary | ICD-10-CM | POA: Diagnosis not present

## 2021-11-09 DIAGNOSIS — L821 Other seborrheic keratosis: Secondary | ICD-10-CM | POA: Diagnosis not present

## 2021-11-09 DIAGNOSIS — L814 Other melanin hyperpigmentation: Secondary | ICD-10-CM | POA: Diagnosis not present

## 2021-11-09 DIAGNOSIS — D225 Melanocytic nevi of trunk: Secondary | ICD-10-CM | POA: Diagnosis not present

## 2021-11-10 ENCOUNTER — Ambulatory Visit: Payer: Medicare PPO

## 2021-11-10 DIAGNOSIS — R42 Dizziness and giddiness: Secondary | ICD-10-CM | POA: Diagnosis not present

## 2021-11-10 DIAGNOSIS — R2681 Unsteadiness on feet: Secondary | ICD-10-CM

## 2021-11-10 NOTE — Therapy (Signed)
OUTPATIENT PHYSICAL THERAPY TREATMENT NOTE   Patient Name: Kimberly [name] MRN: [id number] DOB:[birth year], 81 y.o., female Today's Date: 11/10/2021  PCP: Lajean Manes, MD REFERRING PROVIDER: Lajean Manes, MD  END OF SESSION:   PT End of Session - 11/10/21 1017     Visit Number 2    Number of Visits 9    Date for PT Re-Evaluation 12/02/21    Authorization Type [employer]    PT Start Time 2671    PT Stop Time 1100    PT Time Calculation (min) 45 min    Activity Tolerance Patient tolerated treatment well    Behavior During Therapy WFL for tasks assessed/performed             Past Medical History:  Diagnosis Date   Allergy    Anal polyp 1998   Flex Sig    Anemia    Angular blepharitis of left eye    Ankle fracture    Stress fracture   Anxiety    Aortic atherosclerosis ([place])    Asthma    border line has inhaler   Bunion    Cataracts, bilateral    Corneal scar    left eye   CPAP (continuous positive airway pressure) dependence    Degenerative disc disease    Depression    Diverticulosis of colon (without mention of hemorrhage) 2010   Colonoscopy   Dry eyes    bilateral   Enterocele    External hemorrhoids 2000   Colonoscopy   Family history of malignant neoplasm of gastrointestinal tract    Female cystocele    Fibromyalgia    GERD (gastroesophageal reflux disease)    Hiatal hernia 2005,2010   EGD   History of bronchitis    History of measles    History of mumps    History of strep sore throat    History of urinary tract infection    Hyperlipemia    Hypothyroidism    IBS (irritable bowel syndrome)    Imbalance    Internal hemorrhoids without mention of complication 2458,0998   Colonoscopy    Itching    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[place])    Menopause    Migraine    OSA (obstructive sleep apnea) 05/14/2015   on BiPAP   Osteopenia 10/2013   T score -1.6 FRAX 10%/1.6%   PAF (paroxysmal atrial fibrillation) (HCC)    Pancreatitis    PCO (posterior  capsular opacification)    left   Pneumonia    childhood illness   PONV (postoperative nausea and vomiting)    Pseudophakia, both eyes    PVD (posterior vitreous detachment) right   Rash    on back    Retinal scar    left   Rotator cuff disorder    pain, left shoulder   Shingles    Stricture and stenosis of esophagus 2005,2010   EGD    Stroke ([place])    Ulcerative colitis ([place])    Varicose veins    Vertigo    Wears glasses    Past Surgical History:  Procedure Laterality Date   ABDOMINAL HYSTERECTOMY     ANAL FISSURE REPAIR     BREAST EXCISIONAL BIOPSY Left    BUNIONECTOMY  2014   CATARACT EXTRACTION Bilateral 2013   COLONOSCOPY     polyp removed   ESOPHAGEAL DILATION     EYE SURGERY Left    FOOT SURGERY     left    HEMORRHOID SURGERY     MOUTH  SURGERY  11/13/11   Cyst removed from [place] [employer]  1970's   REPLACEMENT TOTAL KNEE Right 2011   TONSILLECTOMY     TOTAL ABDOMINAL HYSTERECTOMY W/ BILATERAL SALPINGOOPHORECTOMY  1991   TAH BSO   TOTAL HIP ARTHROPLASTY     right    TOTAL KNEE ARTHROPLASTY     both   TOTAL KNEE ARTHROPLASTY Left 06/23/2015   Procedure: LEFT TOTAL KNEE ARTHROPLASTY;  Surgeon: Gaynelle Arabian, MD;  Location: [facility];  Service: Orthopedics;  Laterality: Left;   Patient Active Problem List   Diagnosis Date Noted   Bilateral tinnitus 11/08/2021   Elevated blood-pressure reading, without diagnosis of hypertension 03/13/2021   Microscopic hematuria 11/26/2020   Chronic pain 53/29/9242   Eosinophilic esophagitis 68/34/1962   Generalized anxiety disorder 11/10/2020   Hardening of the aorta (main artery of the heart) ([place]) 11/10/2020   Osteoporosis 11/10/2020   Spondylolisthesis at L4-L5 level 11/10/2020   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   Corns and callosities 05/20/2019   Pain in both feet 05/20/2019   Chest pain 01/28/2019   Chest pain of uncertain etiology 22/97/9892   Adiposity 07/13/2018    Excess weight 07/13/2018   Hay fever 07/13/2018   PAF (paroxysmal atrial fibrillation) (HCC) 08/25/2017   Neck pain 12/30/2016   Chronic low back pain 08/12/2016   Syncope 05/13/2016   Heart palpitations 05/13/2016   Rhinitis, chronic 02/04/2016   IBS (irritable bowel syndrome) 07/14/2015   Nausea without vomiting 07/14/2015   OA (osteoarthritis) of knee 06/23/2015   OSA (obstructive sleep apnea) 05/14/2015   Acute back pain with sciatica 04/03/2015   Preoperative clearance 01/08/2015   Acute infection of nasal sinus 02/25/2014   Pancreatitis 07/22/2013   Leukocytosis, unspecified 07/22/2013   Amblyopia 03/07/2013   Retinal scar 03/07/2013   Cellulitis 01/15/2013   Cerebrovascular small vessel disease 11/08/2012   Angular blepharitis 10/11/2012   Dry eyes 10/11/2012   PCO (posterior capsular opacification) 10/11/2012   PVD (posterior vitreous detachment) 10/11/2012   Crossover toe 09/20/2012   Cataracts, bilateral    Elevated cholesterol    Degenerative disc disease    Bunion    Ankle fracture    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[place])    Hemorrhoid    DUB (dysfunctional uterine bleeding)    DIARRHEA 09/16/2009   PERSONAL HX COLONIC POLYPS 09/16/2009   LACUNAR INFARCTION 07/16/2008   CONSTIPATION 07/12/2008   CHEST PAIN 07/12/2008   DYSPHAGIA 07/12/2008   Hypothyroidism 07/10/2008   Anxiety state 07/10/2008   DEPRESSION 07/10/2008   INTERNAL HEMORRHOIDS 07/10/2008   ESOPHAGEAL STRICTURE 07/10/2008   GERD 07/10/2008   HIATAL HERNIA 07/10/2008   DIVERTICULOSIS, COLON 07/10/2008   IRRITABLE BOWEL SYNDROME 07/10/2008   Osteoarthritis 07/10/2008   FIBROMYALGIA 07/10/2008   Blues 07/10/2008    REFERRING DIAG: REFERRING DIAG: R42 (ICD-10-CM) - Dizziness and giddiness    THERAPY DIAG:  Dizziness and giddiness  Unsteadiness on feet  Rationale for Evaluation and Treatment Rehabilitation  PERTINENT HISTORY: PERTINENT HISTORY: anemia, hx ankle fx, anxiety, asthma, depression,  fibromyalgia, GERD, HLD, lacunar stroke, migraine, osteopenia, a-fib, ulcerative colitis, bunionectomy 2014, R/L TKA 2011/2017, R THA   PRECAUTIONS: n/a  SUBJECTIVE: Notices overall some improvements in symptoms and was able to bend over to pick up item from ground without as much dizziness. Notes symptoms when laying down flat in bed  PAIN:  Are you having pain? Yes: NPRS scale: 5/10 Pain location: neck left > right Pain  description: aching, grinding, can be sharp Aggravating factors: rotation Relieving factors: rest, heat   OBJECTIVE:    DIAGNOSTIC FINDINGS: none recent   COGNITION: Overall cognitive status: Within functional limits for tasks assessed             SENSATION: Reports tingling in the L heel and up the lower leg- occurring for the past couple of months      POSTURE: rounded shoulders and forward head     GAIT: Gait pattern: will assess next session Assistive device utilized: None Level of assistance: Complete Independence       PATIENT SURVEYS:  FOTO 53.95  VESTIBULAR ASSESSMENT              GENERAL OBSERVATION: L eye droop- 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; pt wearing distance glasses                        OCULOMOTOR EXAM:                       Ocular Alignment: normal                       Ocular ROM: No Limitations                       Spontaneous Nystagmus: absent                       Gaze-Induced Nystagmus: absent                       Smooth Pursuits: saccades and in vertical and horizontal directions                       Saccades: intact                       Convergence/Divergence: c/o blurring ~6 inches                VESTIBULAR - OCULAR REFLEX:                        Slow VOR: Comment: trouble maintaining gaze fixation in horizontal and vertical directions; c/o diplopia with head extended                       VOR Cancellation: Comment: difficulty coordinating; c/o neck stiffness                        Head-Impulse Test: HIT Right: negative HIT Left: negative; limited by limited cervical ROM                                       POSITIONAL TESTING:   Right sidelying Test: difficult to see if nystagmus, reports spinning (room spinning sensation) Left sidelying test: negative, no report of symptoms                  VESTIBULAR TREATMENT:   TODAY'S TREATMENT: 11/10/21 Activity Comments  Canalith repositioning: Right Epley Education, regarding self-Epley  VOR x 1 horizontal/vertical 2x30 sec, cues for continuous motion   Pt education regarding findings, positioning for home. Pt education regarding cervical traction for  home. Pt education regarding osteoporosis and exercise prescription                  PATIENT EDUCATION: Education details: self-Epley, POC, HEP, answering patient's questions Person educated: Patient Education method: Explanation, Demonstration, Tactile cues, Verbal cues, and Handouts Education comprehension: verbalized understanding and returned demonstration   Access Code: [email or web address] URL: [email or web address] Date: 11/04/2021 Prepared by: [facility]   Exercises - Seated Gaze Stabilization with Head Rotation  - 1 x daily - 5 x weekly - 2-3 sets - 30 sec hold - Seated Gaze Stabilization with Head Nod  - 1 x daily - 5 x weekly - 2-3 sets - 30 sec hold -Self-Epley: right ear     GOALS: Goals reviewed with patient? Yes   SHORT TERM GOALS: Target date: 11/18/2021   Patient to be independent with initial HEP. Baseline: HEP initiated Goal status: INITIAL       LONG TERM GOALS: Target date: 12/02/2021   Patient to be independent with advanced HEP. Baseline: Not yet initiated  Goal status: INITIAL   Patient to report 0/10 dizziness with standing vertical and horizontal VOR for 30 seconds. Baseline: NT Goal status: INITIAL   Patient will report 0/10 dizziness with bed mobility.  Baseline: Symptomatic   Goal status: INITIAL   Patient to demonstrate mild-moderate sway with M-CTSIB condition with eyes closed/foam surface in order to improve safety in environments with uneven surfaces and dim lighting. Baseline: NT Goal status: INITIAL   Patient to score at least 20/24 on DGI in order to decrease risk of falls. Baseline: NT Goal status: INITIAL       ASSESSMENT:   CLINICAL IMPRESSION:   Demonstrates subjective report of dizziness/room spinning with right sidelying test without nystagmus present and pt report of symptoms lasting 15-30 sec.  Proceeded with Epley maneuver-Right for tx again without notable nystagmus present.  Pt fixated on topic of neck pain and osteoporosis throughout tx session and provided with relevant education on body-weight/weight bearing exercise for osteoporosis and reference to [facility] protocol and cervical traction unit for at-home use for symptom management. Continued sessions to minimize dizziness and unsteadiness symptoms       OBJECTIVE IMPAIRMENTS decreased balance, dizziness, increased muscle spasms, impaired flexibility, postural dysfunction, and pain.    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    REHAB POTENTIAL: Good   CLINICAL DECISION MAKING: Evolving/moderate complexity   EVALUATION COMPLEXITY: Moderate     PLAN: PT FREQUENCY: 1-2x/week   PT DURATION: 4 weeks   PLANNED INTERVENTIONS: Therapeutic exercises, Therapeutic activity,  Neuromuscular re-education, Balance training, Gait training, Patient/Family education, Joint mobilization, Stair training, Vestibular training, Canalith repositioning, Dry Needling, Electrical stimulation, Cryotherapy, Moist heat, Taping, Manual therapy, and Re-evaluation   PLAN FOR NEXT SESSION: re-test positional testing, DGI, M-CTSIB, progress VOR training    12:56 PM, 11/10/21 M. Sherlyn Lees, PT, DPT Physical Therapist- [place] Office Number: [phone]

## 2021-11-12 ENCOUNTER — Ambulatory Visit: Payer: Medicare PPO

## 2021-11-13 ENCOUNTER — Encounter: Payer: Medicare PPO | Admitting: Physical Therapy

## 2021-11-16 NOTE — Therapy (Signed)
OUTPATIENT PHYSICAL THERAPY TREATMENT NOTE   Patient Name: Kimberly [name] MRN: [id number] DOB:31-Aug-1940, 81 y.o., female Today's Date: 11/17/2021  PCP: Lajean Manes, MD REFERRING PROVIDER: Lajean Manes, MD  END OF SESSION:   PT End of Session - 11/17/21 1234     Visit Number 3    Number of Visits 9    Date for PT Re-Evaluation 12/02/21    Authorization Type [employer]    PT Start Time 1152    PT Stop Time 1231    PT Time Calculation (min) 39 min    Equipment Utilized During Treatment Gait belt    Activity Tolerance Patient tolerated treatment well    Behavior During Therapy WFL for tasks assessed/performed              Past Medical History:  Diagnosis Date   Allergy    Anal polyp 1998   Flex Sig    Anemia    Angular blepharitis of left eye    Ankle fracture    Stress fracture   Anxiety    Aortic atherosclerosis ([place])    Asthma    border line has inhaler   Bunion    Cataracts, bilateral    Corneal scar    left eye   CPAP (continuous positive airway pressure) dependence    Degenerative disc disease    Depression    Diverticulosis of colon (without mention of hemorrhage) 2010   Colonoscopy   Dry eyes    bilateral   Enterocele    External hemorrhoids 2000   Colonoscopy   Family history of malignant neoplasm of gastrointestinal tract    Female cystocele    Fibromyalgia    GERD (gastroesophageal reflux disease)    Hiatal hernia 2005,2010   EGD   History of bronchitis    History of measles    History of mumps    History of strep sore throat    History of urinary tract infection    Hyperlipemia    Hypothyroidism    IBS (irritable bowel syndrome)    Imbalance    Internal hemorrhoids without mention of complication 7408,1448   Colonoscopy    Itching    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[place])    Menopause    Migraine    OSA (obstructive sleep apnea) 05/14/2015   on BiPAP   Osteopenia 10/2013   T score -1.6 FRAX 10%/1.6%   PAF (paroxysmal atrial  fibrillation) (HCC)    Pancreatitis    PCO (posterior capsular opacification)    left   Pneumonia    childhood illness   PONV (postoperative nausea and vomiting)    Pseudophakia, both eyes    PVD (posterior vitreous detachment) right   Rash    on back    Retinal scar    left   Rotator cuff disorder    pain, left shoulder   Shingles    Stricture and stenosis of esophagus 2005,2010   EGD    Stroke ([place])    Ulcerative colitis ([place])    Varicose veins    Vertigo    Wears glasses    Past Surgical History:  Procedure Laterality Date   ABDOMINAL HYSTERECTOMY     ANAL FISSURE REPAIR     BREAST EXCISIONAL BIOPSY Left    BUNIONECTOMY  2014   CATARACT EXTRACTION Bilateral 2013   COLONOSCOPY     polyp removed   ESOPHAGEAL DILATION     EYE SURGERY Left    FOOT SURGERY     left  HEMORRHOID SURGERY     MOUTH SURGERY  11/13/11   Cyst removed from [place]  1970's   REPLACEMENT TOTAL KNEE Right 2011   TONSILLECTOMY     TOTAL ABDOMINAL HYSTERECTOMY W/ BILATERAL SALPINGOOPHORECTOMY  1991   TAH BSO   TOTAL HIP ARTHROPLASTY     right    TOTAL KNEE ARTHROPLASTY     both   TOTAL KNEE ARTHROPLASTY Left 06/23/2015   Procedure: LEFT TOTAL KNEE ARTHROPLASTY;  Surgeon: Gaynelle Arabian, MD;  Location: [facility];  Service: Orthopedics;  Laterality: Left;   Patient Active Problem List   Diagnosis Date Noted   Bilateral tinnitus 11/08/2021   Elevated blood-pressure reading, without diagnosis of hypertension 03/13/2021   Microscopic hematuria 11/26/2020   Chronic pain 81/15/7262   Eosinophilic esophagitis 03/55/9741   Generalized anxiety disorder 11/10/2020   Hardening of the aorta (main artery of the heart) ([place]) 11/10/2020   Osteoporosis 11/10/2020   Spondylolisthesis at L4-L5 level 11/10/2020   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   Corns and callosities 05/20/2019   Pain in both feet 05/20/2019   Chest pain 01/28/2019   Chest pain of  uncertain etiology 63/84/5364   Adiposity 07/13/2018   Excess weight 07/13/2018   Hay fever 07/13/2018   PAF (paroxysmal atrial fibrillation) (HCC) 08/25/2017   Neck pain 12/30/2016   Chronic low back pain 08/12/2016   Syncope 05/13/2016   Heart palpitations 05/13/2016   Rhinitis, chronic 02/04/2016   IBS (irritable bowel syndrome) 07/14/2015   Nausea without vomiting 07/14/2015   OA (osteoarthritis) of knee 06/23/2015   OSA (obstructive sleep apnea) 05/14/2015   Acute back pain with sciatica 04/03/2015   Preoperative clearance 01/08/2015   Acute infection of nasal sinus 02/25/2014   Pancreatitis 07/22/2013   Leukocytosis, unspecified 07/22/2013   Amblyopia 03/07/2013   Retinal scar 03/07/2013   Cellulitis 01/15/2013   Cerebrovascular small vessel disease 11/08/2012   Angular blepharitis 10/11/2012   Dry eyes 10/11/2012   PCO (posterior capsular opacification) 10/11/2012   PVD (posterior vitreous detachment) 10/11/2012   Crossover toe 09/20/2012   Cataracts, bilateral    Elevated cholesterol    Degenerative disc disease    Bunion    Ankle fracture    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[place])    Hemorrhoid    DUB (dysfunctional uterine bleeding)    DIARRHEA 09/16/2009   PERSONAL HX COLONIC POLYPS 09/16/2009   LACUNAR INFARCTION 07/16/2008   CONSTIPATION 07/12/2008   CHEST PAIN 07/12/2008   DYSPHAGIA 07/12/2008   Hypothyroidism 07/10/2008   Anxiety state 07/10/2008   DEPRESSION 07/10/2008   INTERNAL HEMORRHOIDS 07/10/2008   ESOPHAGEAL STRICTURE 07/10/2008   GERD 07/10/2008   HIATAL HERNIA 07/10/2008   DIVERTICULOSIS, COLON 07/10/2008   IRRITABLE BOWEL SYNDROME 07/10/2008   Osteoarthritis 07/10/2008   FIBROMYALGIA 07/10/2008   Blues 07/10/2008    REFERRING DIAG: REFERRING DIAG: R42 (ICD-10-CM) - Dizziness and giddiness    THERAPY DIAG:  Dizziness and giddiness  Unsteadiness on feet  Rationale for Evaluation and Treatment Rehabilitation  PERTINENT HISTORY: PERTINENT HISTORY:  anemia, hx ankle fx, anxiety, asthma, depression, fibromyalgia, GERD, HLD, lacunar stroke, migraine, osteopenia, a-fib, ulcerative colitis, bunionectomy 2014, R/L TKA 2011/2017, R THA   PRECAUTIONS: n/a  SUBJECTIVE: Dizziness has been better since initial eval. Has had a few episodes of dizziness in the AM. Reports that she had a little drop of blood come from the R ear last night; unsure if there's a scratch in there.  PAIN:  Are you having pain? Yes: NPRS scale: 5/10 Pain location: neck left > right Pain description: aching, grinding, can be sharp Aggravating factors: rotation Relieving factors: rest, heat   OBJECTIVE:      TODAY'S TREATMENT: 11/17/21 Activity Comments  Sit>supine Upbeating R torsional nystagmus lasting ~30 sec  R roll test negative  L roll test Indiscernable nystagmus lasting ~10 sec  R DH R Upbeating torsional nystagmus lasting ~40 sec  L DH negative  R Epley x2 Tolerated well  R DH R Upbeating torsional nystagmus lasting ~25 sec      [facility] PT Assessment - 11/17/21 0001       Standardized Balance Assessment   Standardized Balance Assessment Dynamic Gait Index      Dynamic Gait Index   Level Surface Normal    Change in Gait Speed Normal    Gait with Horizontal Head Turns Mild Impairment    Gait with Vertical Head Turns Mild Impairment    Gait and Pivot Turn Mild Impairment    Step Over Obstacle Mild Impairment    Step Around Obstacles Normal    Steps Mild Impairment    Total Score 19             M-CTSIB  Condition 1: Firm Surface, EO 30 Sec, Normal Sway  Condition 2: Firm Surface, EC 30 Sec, Mild Sway  Condition 3: Foam Surface, EO 30 Sec, Mild and Moderate Sway  Condition 4: Foam Surface, EC 30 Sec, Moderate Sway    PATIENT EDUCATION: Education details: edu on today's objective measures; removed self-epley for max safety at home (patient reports limited understanding of this and husband unable to assist); answered patient's  questions Person educated: Patient Education method: Explanation, Demonstration, Tactile cues, and Verbal cues Education comprehension: verbalized understanding and returned demonstration   HOME EXERCISE PROGRAM Last updated: 11/17/21   Access Code: [email or web address] URL: [email or web address] Date: 11/04/2021 Prepared by: [facility]   Exercises - Seated Gaze Stabilization with Head Rotation  - 1 x daily - 5 x weekly - 2-3 sets - 30 sec hold - Seated Gaze Stabilization with Head Nod  - 1 x daily - 5 x weekly - 2-3 sets - 30 sec hold         Below measures were taken at time of initial evaluation unless otherwise specified:  DIAGNOSTIC FINDINGS: none recent   COGNITION: Overall cognitive status: Within functional limits for tasks assessed             SENSATION: Reports tingling in the L heel and up the lower leg- occurring for the past couple of months      POSTURE: rounded shoulders and forward head     GAIT: Gait pattern: will assess next session Assistive device utilized: None Level of assistance: Complete Independence       PATIENT SURVEYS:  FOTO 53.95  VESTIBULAR ASSESSMENT              GENERAL OBSERVATION: L eye droop- 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; pt wearing distance glasses                        OCULOMOTOR EXAM:                       Ocular Alignment: normal  Ocular ROM: No Limitations                       Spontaneous Nystagmus: absent                       Gaze-Induced Nystagmus: absent                       Smooth Pursuits: saccades and in vertical and horizontal directions                       Saccades: intact                       Convergence/Divergence: c/o blurring ~6 inches                VESTIBULAR - OCULAR REFLEX:                        Slow VOR: Comment: trouble maintaining gaze fixation in horizontal and vertical directions; c/o diplopia with  head extended                       VOR Cancellation: Comment: difficulty coordinating; c/o neck stiffness                       Head-Impulse Test: HIT Right: negative HIT Left: negative; limited by limited cervical ROM                                       POSITIONAL TESTING:   Right sidelying Test: difficult to see if nystagmus, reports spinning (room spinning sensation) Left sidelying test: negative, no report of symptoms                    GOALS: Goals reviewed with patient? Yes   SHORT TERM GOALS: Target date: 11/18/2021   Patient to be independent with initial HEP. Baseline: HEP initiated Goal status:        LONG TERM GOALS: Target date: 12/02/2021   Patient to be independent with advanced HEP. Baseline: Not yet initiated  Goal status: IN PROGRESS   Patient to report 0/10 dizziness with standing vertical and horizontal VOR for 30 seconds. Baseline: NT Goal status: IN PROGRESS   Patient will report 0/10 dizziness with bed mobility.  Baseline: Symptomatic  Goal status: IN PROGRESS   Patient to demonstrate mild-moderate sway with M-CTSIB condition with eyes closed/foam surface in order to improve safety in environments with uneven surfaces and dim lighting. Baseline: NT Goal status: IN PROGRESS   Patient to score at least 20/24 on DGI in order to decrease risk of falls. Baseline: NT Goal status: IN PROGRESS       ASSESSMENT:   CLINICAL IMPRESSION:   Patient arrived to session with report of improved dizziness. Recalls that she noticed a drop of blood come from the R ear last night; upon observation no bleeding or redness evident in external auditory canal. Patient scored 19/24 on DGI, indicating an increased risk of falls. M-CTSIB revealed difficulty using vestibular system for balance. Positional testing today suggests R posterior canalithiasis which was treated with R Epley x2. Patient without complaints at end of session.      OBJECTIVE IMPAIRMENTS  decreased balance, dizziness, increased muscle spasms,  impaired flexibility, postural dysfunction, and pain.    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    REHAB POTENTIAL: Good   CLINICAL DECISION MAKING: Evolving/moderate complexity   EVALUATION COMPLEXITY: Moderate     PLAN: PT FREQUENCY: 1-2x/week   PT DURATION: 4 weeks   PLANNED INTERVENTIONS: Therapeutic exercises, Therapeutic activity, Neuromuscular re-education, Balance training, Gait training, Patient/Family education, Joint mobilization, Stair training, Vestibular training, Canalith repositioning, Dry Needling, Electrical stimulation, Cryotherapy, Moist heat, Taping, Manual therapy, and Re-evaluation   PLAN FOR NEXT SESSION: re-test R DH, progress VOR training and habituation    Janene Harvey, PT, DPT 11/17/21 12:38 PM  [facility] at [facility] [address], [place], [place] [address] Phone # [phone] Fax # [phone]

## 2021-11-17 ENCOUNTER — Ambulatory Visit: Payer: Medicare PPO | Admitting: Physical Therapy

## 2021-11-17 ENCOUNTER — Encounter: Payer: Self-pay | Admitting: Physical Therapy

## 2021-11-17 DIAGNOSIS — R2681 Unsteadiness on feet: Secondary | ICD-10-CM

## 2021-11-17 DIAGNOSIS — R42 Dizziness and giddiness: Secondary | ICD-10-CM

## 2021-11-19 ENCOUNTER — Ambulatory Visit: Payer: Medicare PPO

## 2021-11-24 ENCOUNTER — Encounter: Payer: Self-pay | Admitting: Cardiology

## 2021-11-24 ENCOUNTER — Ambulatory Visit: Payer: Medicare PPO | Admitting: Cardiology

## 2021-11-24 ENCOUNTER — Ambulatory Visit: Payer: Medicare PPO

## 2021-11-24 VITALS — BP 135/74 | HR 58 | Ht 67.5 in | Wt 162.0 lb

## 2021-11-24 DIAGNOSIS — G4733 Obstructive sleep apnea (adult) (pediatric): Secondary | ICD-10-CM | POA: Diagnosis not present

## 2021-11-24 DIAGNOSIS — I480 Paroxysmal atrial fibrillation: Secondary | ICD-10-CM

## 2021-11-24 DIAGNOSIS — R2681 Unsteadiness on feet: Secondary | ICD-10-CM | POA: Diagnosis not present

## 2021-11-24 DIAGNOSIS — R42 Dizziness and giddiness: Secondary | ICD-10-CM

## 2021-11-24 NOTE — Therapy (Signed)
OUTPATIENT PHYSICAL THERAPY TREATMENT NOTE   Patient Name: Kimberly [name] MRN: [id number] DOB:[birth year], 81 y.o., female Today's Date: 11/24/2021  PCP: Merlene Laughter, MD REFERRING PROVIDER: Merlene Laughter, MD  END OF SESSION:   PT End of Session - 11/24/21 1413     Visit Number 4    Number of Visits 9    Date for PT Re-Evaluation 12/02/21    Authorization Type [employer]    PT Start Time 1413   late arrival   PT Stop Time 1445    PT Time Calculation (min) 32 min    Equipment Utilized During Treatment Gait belt    Activity Tolerance Patient tolerated treatment well    Behavior During Therapy WFL for tasks assessed/performed              Past Medical History:  Diagnosis Date   Allergy    Anal polyp 1998   Flex Sig    Anemia    Angular blepharitis of left eye    Ankle fracture    Stress fracture   Anxiety    Aortic atherosclerosis (HCC)    Asthma    border line has inhaler   Bunion    Cataracts, bilateral    Corneal scar    left eye   CPAP (continuous positive airway pressure) dependence    Degenerative disc disease    Depression    Diverticulosis of colon (without mention of hemorrhage) 2010   Colonoscopy   Dry eyes    bilateral   Enterocele    External hemorrhoids 2000   Colonoscopy   Family history of malignant neoplasm of gastrointestinal tract    Female cystocele    Fibromyalgia    GERD (gastroesophageal reflux disease)    Hiatal hernia 2005,2010   EGD   History of bronchitis    History of measles    History of mumps    History of strep sore throat    History of urinary tract infection    Hyperlipemia    Hypothyroidism    IBS (irritable bowel syndrome)    Imbalance    Internal hemorrhoids without mention of complication 1995,2005   Colonoscopy    Itching    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HCC)    Menopause    Migraine    OSA (obstructive sleep apnea) 05/14/2015   on BiPAP   Osteopenia 10/2013   T score -1.6 FRAX 10%/1.6%   PAF  (paroxysmal atrial fibrillation) (HCC)    Pancreatitis    PCO (posterior capsular opacification)    left   Pneumonia    childhood illness   PONV (postoperative nausea and vomiting)    Pseudophakia, both eyes    PVD (posterior vitreous detachment) right   Rash    on back    Retinal scar    left   Rotator cuff disorder    pain, left shoulder   Shingles    Stricture and stenosis of esophagus 2005,2010   EGD    Stroke (HCC)    Ulcerative colitis (HCC)    Varicose veins    Vertigo    Wears glasses    Past Surgical History:  Procedure Laterality Date   ABDOMINAL HYSTERECTOMY     ANAL FISSURE REPAIR     BREAST EXCISIONAL BIOPSY Left    BUNIONECTOMY  2014   CATARACT EXTRACTION Bilateral 2013   COLONOSCOPY     polyp removed   ESOPHAGEAL DILATION     EYE SURGERY Left    FOOT SURGERY  left    HEMORRHOID SURGERY     MOUTH SURGERY  11/13/11   Cyst removed from gum-benign    NASAL SEPTUM SURGERY     NASAL SEPTUM SURGERY  1970's   REPLACEMENT TOTAL KNEE Right 2011   TONSILLECTOMY     TOTAL ABDOMINAL HYSTERECTOMY W/ BILATERAL SALPINGOOPHORECTOMY  1991   TAH BSO   TOTAL HIP ARTHROPLASTY     right    TOTAL KNEE ARTHROPLASTY     both   TOTAL KNEE ARTHROPLASTY Left 06/23/2015   Procedure: LEFT TOTAL KNEE ARTHROPLASTY;  Surgeon: Ollen Gross, MD;  Location: [facility];  Service: Orthopedics;  Laterality: Left;   Patient Active Problem List   Diagnosis Date Noted   Bilateral tinnitus 11/08/2021   Elevated blood-pressure reading, without diagnosis of hypertension 03/13/2021   Microscopic hematuria 11/26/2020   Chronic pain 11/10/2020   Eosinophilic esophagitis 11/10/2020   Generalized anxiety disorder 11/10/2020   Hardening of the aorta (main artery of the heart) (HCC) 11/10/2020   Osteoporosis 11/10/2020   Spondylolisthesis at L4-L5 level 11/10/2020   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   Corns and callosities 05/20/2019   Pain in both feet 05/20/2019   Chest pain 01/28/2019    Chest pain of uncertain etiology 12/25/2018   Adiposity 07/13/2018   Excess weight 07/13/2018   Hay fever 07/13/2018   PAF (paroxysmal atrial fibrillation) (HCC) 08/25/2017   Neck pain 12/30/2016   Chronic low back pain 08/12/2016   Syncope 05/13/2016   Heart palpitations 05/13/2016   Rhinitis, chronic 02/04/2016   IBS (irritable bowel syndrome) 07/14/2015   Nausea without vomiting 07/14/2015   OA (osteoarthritis) of knee 06/23/2015   OSA (obstructive sleep apnea) 05/14/2015   Acute back pain with sciatica 04/03/2015   Preoperative clearance 01/08/2015   Acute infection of nasal sinus 02/25/2014   Pancreatitis 07/22/2013   Leukocytosis, unspecified 07/22/2013   Amblyopia 03/07/2013   Retinal scar 03/07/2013   Cellulitis 01/15/2013   Cerebrovascular small vessel disease 11/08/2012   Angular blepharitis 10/11/2012   Dry eyes 10/11/2012   PCO (posterior capsular opacification) 10/11/2012   PVD (posterior vitreous detachment) 10/11/2012   Crossover toe 09/20/2012   Cataracts, bilateral    Elevated cholesterol    Degenerative disc disease    Bunion    Ankle fracture    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HCC)    Hemorrhoid    DUB (dysfunctional uterine bleeding)    DIARRHEA 09/16/2009   PERSONAL HX COLONIC POLYPS 09/16/2009   LACUNAR INFARCTION 07/16/2008   CONSTIPATION 07/12/2008   CHEST PAIN 07/12/2008   DYSPHAGIA 07/12/2008   Hypothyroidism 07/10/2008   Anxiety state 07/10/2008   DEPRESSION 07/10/2008   INTERNAL HEMORRHOIDS 07/10/2008   ESOPHAGEAL STRICTURE 07/10/2008   GERD 07/10/2008   HIATAL HERNIA 07/10/2008   DIVERTICULOSIS, COLON 07/10/2008   IRRITABLE BOWEL SYNDROME 07/10/2008   Osteoarthritis 07/10/2008   FIBROMYALGIA 07/10/2008   Blues 07/10/2008    REFERRING DIAG: REFERRING DIAG: R42 (ICD-10-CM) - Dizziness and giddiness    THERAPY DIAG:  Dizziness and giddiness  Unsteadiness on feet  Rationale for Evaluation and Treatment Rehabilitation  PERTINENT HISTORY:  PERTINENT HISTORY: anemia, hx ankle fx, anxiety, asthma, depression, fibromyalgia, GERD, HLD, lacunar stroke, migraine, osteopenia, a-fib, ulcerative colitis, bunionectomy 2014, R/L TKA 2011/2017, R THA   PRECAUTIONS: n/a  SUBJECTIVE: Feeling much better without reappearance of the vertigo except for one instance.    PAIN:  Are you having pain? Yes: NPRS scale: 5/10 Pain location: neck left > right Pain description: aching, grinding, can be  sharp Aggravating factors: rotation Relieving factors: rest, heat   OBJECTIVE:   TODAY'S TREATMENT: 11/24/21 Activity Comments  Right Dix-Hallpike R Upbeating torsional nystagmus lasting ~30 sec  Right Epley x 2  Nystagmus approx 20-30 sec  Chin retraction 10x 3 sec   Left Dix-Hallpike negative                M-CTSIB  Condition 1: Firm Surface, EO 30 Sec, Normal Sway  Condition 2: Firm Surface, EC 30 Sec, Mild Sway  Condition 3: Foam Surface, EO 30 Sec, Mild and Moderate Sway  Condition 4: Foam Surface, EC 30 Sec, Moderate Sway    PATIENT EDUCATION: Education details: edu on today's objective measures; removed self-epley for max safety at home (patient reports limited understanding of this and husband unable to assist); answered patient's questions Person educated: Patient Education method: Explanation, Demonstration, Tactile cues, and Verbal cues Education comprehension: verbalized understanding and returned demonstration   HOME EXERCISE PROGRAM Last updated: 11/17/21   Access Code: [email or web address] URL: [email or web address] Date: 11/04/2021 Prepared by: [facility] - Outpatient  Rehab - [facility]   Exercises - Seated Gaze Stabilization with Head Rotation  - 1 x daily - 5 x weekly - 2-3 sets - 30 sec hold - Seated Gaze Stabilization with Head Nod  - 1 x daily - 5 x weekly - 2-3 sets - 30 sec hold         Below measures were taken at time of initial evaluation unless otherwise specified:  DIAGNOSTIC  FINDINGS: none recent   COGNITION: Overall cognitive status: Within functional limits for tasks assessed             SENSATION: Reports tingling in the L heel and up the lower leg- occurring for the past couple of months      POSTURE: rounded shoulders and forward head     GAIT: Gait pattern: will assess next session Assistive device utilized: None Level of assistance: Complete Independence       PATIENT SURVEYS:  FOTO 53.95  VESTIBULAR ASSESSMENT              GENERAL OBSERVATION: L eye droop- 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; pt wearing distance glasses                        OCULOMOTOR EXAM:                       Ocular Alignment: normal                       Ocular ROM: No Limitations                       Spontaneous Nystagmus: absent                       Gaze-Induced Nystagmus: absent                       Smooth Pursuits: saccades and in vertical and horizontal directions                       Saccades: intact                       Convergence/Divergence: c/o blurring ~6 inches  VESTIBULAR - OCULAR REFLEX:                        Slow VOR: Comment: trouble maintaining gaze fixation in horizontal and vertical directions; c/o diplopia with head extended                       VOR Cancellation: Comment: difficulty coordinating; c/o neck stiffness                       Head-Impulse Test: HIT Right: negative HIT Left: negative; limited by limited cervical ROM                                       POSITIONAL TESTING:   Right sidelying Test: difficult to see if nystagmus, reports spinning (room spinning sensation) Left sidelying test: negative, no report of symptoms                    GOALS: Goals reviewed with patient? Yes   SHORT TERM GOALS: Target date: 11/18/2021   Patient to be independent with initial HEP. Baseline: HEP initiated Goal status:        LONG TERM GOALS: Target date: 12/02/2021   Patient to be  independent with advanced HEP. Baseline: Not yet initiated  Goal status: IN PROGRESS   Patient to report 0/10 dizziness with standing vertical and horizontal VOR for 30 seconds. Baseline: NT Goal status: IN PROGRESS   Patient will report 0/10 dizziness with bed mobility.  Baseline: Symptomatic  Goal status: IN PROGRESS   Patient to demonstrate mild-moderate sway with M-CTSIB condition with eyes closed/foam surface in order to improve safety in environments with uneven surfaces and dim lighting. Baseline: NT Goal status: IN PROGRESS   Patient to score at least 20/24 on DGI in order to decrease risk of falls. Baseline: NT Goal status: IN PROGRESS       ASSESSMENT:   CLINICAL IMPRESSION:   Continues to experience positive right Dix-Hallpike with upbeating nystagmus with slight latency and duration of 20-30 sec. Able to tolerate two Epley maneuver.  Complains of left cervical column pain worse with Spurling's position. Pt would benefit from continued sessions to address positional vertigo and progress vestibular rehabilitation and address chronic neck pain and postural deviations   OBJECTIVE IMPAIRMENTS decreased balance, dizziness, increased muscle spasms, impaired flexibility, postural dysfunction, and pain.    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    REHAB POTENTIAL: Good   CLINICAL DECISION MAKING: Evolving/moderate complexity   EVALUATION COMPLEXITY: Moderate      PLAN: PT FREQUENCY: 1-2x/week   PT DURATION: 4 weeks   PLANNED INTERVENTIONS: Therapeutic exercises, Therapeutic activity, Neuromuscular re-education, Balance training, Gait training, Patient/Family education, Joint mobilization, Stair training, Vestibular training, Canalith repositioning, Dry Needling, Electrical stimulation, Cryotherapy, Moist heat, Taping, Manual therapy, and Re-evaluation   PLAN FOR NEXT SESSION: re-test R DH, progress VOR training and habituation    3:23 PM, 11/24/21 M.  Shary Decamp, PT, DPT Physical Therapist- [place] Office Number: [phone]   [facility] at [address] [facility] [address] [place], [place] [address] Phone # [phone] Fax # [phone]

## 2021-11-27 ENCOUNTER — Ambulatory Visit: Payer: Medicare PPO

## 2021-11-30 ENCOUNTER — Ambulatory Visit: Payer: Medicare PPO

## 2021-12-02 ENCOUNTER — Ambulatory Visit: Payer: Medicare PPO | Attending: Geriatric Medicine

## 2021-12-02 DIAGNOSIS — R2681 Unsteadiness on feet: Secondary | ICD-10-CM | POA: Diagnosis not present

## 2021-12-02 DIAGNOSIS — R42 Dizziness and giddiness: Secondary | ICD-10-CM | POA: Insufficient documentation

## 2021-12-02 NOTE — Therapy (Signed)
OUTPATIENT PHYSICAL THERAPY TREATMENT NOTE and D/C Summary   Patient Name: Kimberly [name] MRN: [id number] DOB:Jul 26, 1940, 81 y.o., female Today's Date: 12/02/2021  PCP: Lajean Manes, MD REFERRING PROVIDER: Lajean Manes, MD  END OF SESSION:   PT End of Session - 12/02/21 1103     Visit Number 5    Number of Visits 9    Date for PT Re-Evaluation 12/02/21    Authorization Type [employer]    PT Start Time 1101    PT Stop Time 8841    PT Time Calculation (min) 44 min    Equipment Utilized During Treatment Gait belt    Activity Tolerance Patient tolerated treatment well    Behavior During Therapy WFL for tasks assessed/performed              Past Medical History:  Diagnosis Date   Allergy    Anal polyp 1998   Flex Sig    Anemia    Angular blepharitis of left eye    Ankle fracture    Stress fracture   Anxiety    Aortic atherosclerosis ([place])    Asthma    border line has inhaler   Bunion    Cataracts, bilateral    Corneal scar    left eye   CPAP (continuous positive airway pressure) dependence    Degenerative disc disease    Depression    Diverticulosis of colon (without mention of hemorrhage) 2010   Colonoscopy   Dry eyes    bilateral   Enterocele    External hemorrhoids 2000   Colonoscopy   Family history of malignant neoplasm of gastrointestinal tract    Female cystocele    Fibromyalgia    GERD (gastroesophageal reflux disease)    Hiatal hernia 2005,2010   EGD   History of bronchitis    History of measles    History of mumps    History of strep sore throat    History of urinary tract infection    Hyperlipemia    Hypothyroidism    IBS (irritable bowel syndrome)    Imbalance    Internal hemorrhoids without mention of complication 6606,3016   Colonoscopy    Itching    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[place])    Menopause    Migraine    OSA (obstructive sleep apnea) 05/14/2015   on BiPAP   Osteopenia 10/2013   T score -1.6 FRAX 10%/1.6%   PAF  (paroxysmal atrial fibrillation) (HCC)    Pancreatitis    PCO (posterior capsular opacification)    left   Pneumonia    childhood illness   PONV (postoperative nausea and vomiting)    Pseudophakia, both eyes    PVD (posterior vitreous detachment) right   Rash    on back    Retinal scar    left   Rotator cuff disorder    pain, left shoulder   Shingles    Stricture and stenosis of esophagus 2005,2010   EGD    Stroke ([place])    Ulcerative colitis ([place])    Varicose veins    Vertigo    Wears glasses    Past Surgical History:  Procedure Laterality Date   ABDOMINAL HYSTERECTOMY     ANAL FISSURE REPAIR     BREAST EXCISIONAL BIOPSY Left    BUNIONECTOMY  2014   CATARACT EXTRACTION Bilateral 2013   COLONOSCOPY     polyp removed   ESOPHAGEAL DILATION     EYE SURGERY Left    FOOT SURGERY  left    HEMORRHOID SURGERY     MOUTH SURGERY  11/13/11   Cyst removed from [place]  1970's   REPLACEMENT TOTAL KNEE Right 2011   TONSILLECTOMY     TOTAL ABDOMINAL HYSTERECTOMY W/ BILATERAL SALPINGOOPHORECTOMY  1991   TAH BSO   TOTAL HIP ARTHROPLASTY     right    TOTAL KNEE ARTHROPLASTY     both   TOTAL KNEE ARTHROPLASTY Left 06/23/2015   Procedure: LEFT TOTAL KNEE ARTHROPLASTY;  Surgeon: Gaynelle Arabian, MD;  Location: [facility];  Service: Orthopedics;  Laterality: Left;   Patient Active Problem List   Diagnosis Date Noted   Bilateral tinnitus 11/08/2021   Elevated blood-pressure reading, without diagnosis of hypertension 03/13/2021   Microscopic hematuria 11/26/2020   Chronic pain 38/25/0539   Eosinophilic esophagitis 76/73/4193   Generalized anxiety disorder 11/10/2020   Hardening of the aorta (main artery of the heart) ([place]) 11/10/2020   Osteoporosis 11/10/2020   Spondylolisthesis at L4-L5 level 11/10/2020   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   Corns and callosities 05/20/2019   Pain in both feet 05/20/2019   Chest pain 01/28/2019    Chest pain of uncertain etiology 79/07/4095   Adiposity 07/13/2018   Excess weight 07/13/2018   Hay fever 07/13/2018   PAF (paroxysmal atrial fibrillation) (HCC) 08/25/2017   Neck pain 12/30/2016   Chronic low back pain 08/12/2016   Syncope 05/13/2016   Heart palpitations 05/13/2016   Rhinitis, chronic 02/04/2016   IBS (irritable bowel syndrome) 07/14/2015   Nausea without vomiting 07/14/2015   OA (osteoarthritis) of knee 06/23/2015   OSA (obstructive sleep apnea) 05/14/2015   Acute back pain with sciatica 04/03/2015   Preoperative clearance 01/08/2015   Acute infection of nasal sinus 02/25/2014   Pancreatitis 07/22/2013   Leukocytosis, unspecified 07/22/2013   Amblyopia 03/07/2013   Retinal scar 03/07/2013   Cellulitis 01/15/2013   Cerebrovascular small vessel disease 11/08/2012   Angular blepharitis 10/11/2012   Dry eyes 10/11/2012   PCO (posterior capsular opacification) 10/11/2012   PVD (posterior vitreous detachment) 10/11/2012   Crossover toe 09/20/2012   Cataracts, bilateral    Elevated cholesterol    Degenerative disc disease    Bunion    Ankle fracture    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[place])    Hemorrhoid    DUB (dysfunctional uterine bleeding)    DIARRHEA 09/16/2009   PERSONAL HX COLONIC POLYPS 09/16/2009   LACUNAR INFARCTION 07/16/2008   CONSTIPATION 07/12/2008   CHEST PAIN 07/12/2008   DYSPHAGIA 07/12/2008   Hypothyroidism 07/10/2008   Anxiety state 07/10/2008   DEPRESSION 07/10/2008   INTERNAL HEMORRHOIDS 07/10/2008   ESOPHAGEAL STRICTURE 07/10/2008   GERD 07/10/2008   HIATAL HERNIA 07/10/2008   DIVERTICULOSIS, COLON 07/10/2008   IRRITABLE BOWEL SYNDROME 07/10/2008   Osteoarthritis 07/10/2008   FIBROMYALGIA 07/10/2008   Blues 07/10/2008    REFERRING DIAG: REFERRING DIAG: R42 (ICD-10-CM) - Dizziness and giddiness    THERAPY DIAG:  Dizziness and giddiness  Unsteadiness on feet  Rationale for Evaluation and Treatment Rehabilitation  PERTINENT HISTORY:  PERTINENT HISTORY: anemia, hx ankle fx, anxiety, asthma, depression, fibromyalgia, GERD, HLD, lacunar stroke, migraine, osteopenia, a-fib, ulcerative colitis, bunionectomy 2014, R/L TKA 2011/2017, R THA   PRECAUTIONS: n/a  SUBJECTIVE: Only one instance of feeling like there was going to be an episode going to bed on Friday night. Neck pain on left side is troublesome and stiff but overall feeling much improved  PAIN:  Are  you having pain? Yes: NPRS scale: 5/10 Pain location: neck left > right Pain description: aching, grinding, can be sharp Aggravating factors: rotation Relieving factors: rest, heat   OBJECTIVE:   TODAY'S TREATMENT: 12/02/21 Activity Comments  Right Dix-Hallpike right and left Negative, no symptoms or nystagmus  Manual Cervical traction Reports some relief  M-CTSIB Mild-mod sway condition 4  VOR x 1 sitting/standing horiz/vert No symptoms, difficulty achieving _0  due to neck pain  Bed mobility/functional mobility performance No symptoms with various positions from sidelying, supine, sitting, standing  Dynamic Gait Index 21/24          M-CTSIB  Condition 1: Firm Surface, EO 30 Sec, Normal Sway  Condition 2: Firm Surface, EC 30 Sec, Mild Sway  Condition 3: Foam Surface, EO 30 Sec, Mild and Moderate Sway  Condition 4: Foam Surface, EC 30 Sec, Moderate Sway    PATIENT EDUCATION: Education details: edu on today's objective measures; removed self-epley for max safety at home (patient reports limited understanding of this and husband unable to assist); answered patient's questions Person educated: Patient Education method: Explanation, Demonstration, Tactile cues, and Verbal cues Education comprehension: verbalized understanding and returned demonstration   HOME EXERCISE PROGRAM Last updated: 11/17/21   Access Code: [email or web address] URL: [email or web address] Date: 11/04/2021 Prepared by: [facility]    Exercises - Seated Gaze Stabilization with Head Rotation  - 1 x daily - 5 x weekly - 2-3 sets - 30 sec hold - Seated Gaze Stabilization with Head Nod  - 1 x daily - 5 x weekly - 2-3 sets - 30 sec hold         Below measures were taken at time of initial evaluation unless otherwise specified:  DIAGNOSTIC FINDINGS: none recent   COGNITION: Overall cognitive status: Within functional limits for tasks assessed             SENSATION: Reports tingling in the L heel and up the lower leg- occurring for the past couple of months      POSTURE: rounded shoulders and forward head     GAIT: Gait pattern: will assess next session Assistive device utilized: None Level of assistance: Complete Independence       PATIENT SURVEYS:  FOTO 53.95  VESTIBULAR ASSESSMENT              GENERAL OBSERVATION: L eye droop- 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; pt wearing distance glasses                        OCULOMOTOR EXAM:                       Ocular Alignment: normal                       Ocular ROM: No Limitations                       Spontaneous Nystagmus: absent                       Gaze-Induced Nystagmus: absent                       Smooth Pursuits: saccades and in vertical and horizontal directions  Saccades: intact                       Convergence/Divergence: c/o blurring ~6 inches                VESTIBULAR - OCULAR REFLEX:                        Slow VOR: Comment: trouble maintaining gaze fixation in horizontal and vertical directions; c/o diplopia with head extended                       VOR Cancellation: Comment: difficulty coordinating; c/o neck stiffness                       Head-Impulse Test: HIT Right: negative HIT Left: negative; limited by limited cervical ROM                                       POSITIONAL TESTING:   Right sidelying Test: difficult to see if nystagmus, reports spinning (room spinning sensation) Left  sidelying test: negative, no report of symptoms                    GOALS: Goals reviewed with patient? Yes   SHORT TERM GOALS: Target date: 11/18/2021   Patient to be independent with initial HEP. Baseline: HEP initiated Goal status: MET       LONG TERM GOALS: Target date: 12/02/2021   Patient to be independent with advanced HEP. Baseline: Not yet initiated  Goal status: MET   Patient to report 0/10 dizziness with standing vertical and horizontal VOR for 30 seconds. Baseline: NT Goal status: MET   Patient will report 0/10 dizziness with bed mobility.  Baseline: Symptomatic  Goal status: MET   Patient to demonstrate mild-moderate sway with M-CTSIB condition with eyes closed/foam surface in order to improve safety in environments with uneven surfaces and dim lighting. Baseline: NT Goal status: MET   Patient to score at least 20/24 on DGI in order to decrease risk of falls. Baseline: 21/24 Goal status: MET       ASSESSMENT:   CLINICAL IMPRESSION:   Dix-Hallpike does not demonstrate any nystagmus, nor does pt experience any symptoms.  Pt able to perform battery of positions and movements without any provocation.  Pt notes overall improvement in her feeling of unsteadiness as well reporting improved safety with navigating dark room at home and no symptoms when going to bed.  Her chief complaint is neck pain and pt education on procedure to seek PT intervention for neck pain.  Pt able to meet STG/LTG and would like to D/C at this time to pursue treatment for neck pain.   OBJECTIVE IMPAIRMENTS decreased balance, dizziness, increased muscle spasms, impaired flexibility, postural dysfunction, and pain.    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    REHAB POTENTIAL: Good   CLINICAL DECISION MAKING: Evolving/moderate complexity  EVALUATION COMPLEXITY: Moderate     PLAN: PT FREQUENCY: 1-2x/week   PT DURATION: 4 weeks   PLANNED INTERVENTIONS: Therapeutic exercises, Therapeutic activity, Neuromuscular re-education, Balance training, Gait training, Patient/Family education, Joint mobilization, Stair training, Vestibular training, Canalith repositioning, Dry Needling, Electrical stimulation, Cryotherapy, Moist heat, Taping, Manual therapy, and Re-evaluation   PLAN FOR NEXT SESSION: D/C to HEP   11:04 AM, 12/02/21 M. Sherlyn Lees, PT, DPT Physical Therapist- [place] Office Number: [phone]   [facility] [address] [place], [place] [address] Phone # [phone] Fax # [phone]

## 2021-12-04 DIAGNOSIS — H524 Presbyopia: Secondary | ICD-10-CM | POA: Diagnosis not present

## 2021-12-04 DIAGNOSIS — H04123 Dry eye syndrome of bilateral lacrimal glands: Secondary | ICD-10-CM | POA: Diagnosis not present

## 2021-12-04 DIAGNOSIS — H1789 Other corneal scars and opacities: Secondary | ICD-10-CM | POA: Diagnosis not present

## 2021-12-04 DIAGNOSIS — H26492 Other secondary cataract, left eye: Secondary | ICD-10-CM | POA: Diagnosis not present

## 2021-12-08 ENCOUNTER — Ambulatory Visit: Payer: Medicare PPO | Admitting: Allergy and Immunology

## 2021-12-29 ENCOUNTER — Ambulatory Visit: Payer: Medicare PPO | Admitting: Allergy and Immunology

## 2022-01-05 ENCOUNTER — Ambulatory Visit (HOSPITAL_BASED_OUTPATIENT_CLINIC_OR_DEPARTMENT_OTHER): Payer: Medicare PPO | Admitting: Physical Therapy

## 2022-01-15 ENCOUNTER — Ambulatory Visit (HOSPITAL_BASED_OUTPATIENT_CLINIC_OR_DEPARTMENT_OTHER): Payer: Medicare PPO | Admitting: Physical Therapy

## 2022-01-28 DIAGNOSIS — S46812A Strain of other muscles, fascia and tendons at shoulder and upper arm level, left arm, initial encounter: Secondary | ICD-10-CM | POA: Diagnosis not present

## 2022-01-31 ENCOUNTER — Other Ambulatory Visit: Payer: Self-pay | Admitting: Cardiology

## 2022-01-31 ENCOUNTER — Other Ambulatory Visit: Payer: Self-pay | Admitting: Allergy and Immunology

## 2022-02-02 NOTE — Telephone Encounter (Signed)
Pt last saw Dr Radford Pax 11/24/21, last labs 06/16/21 Creat 0.80, age 81, weight 73.5kg, based on specified criteria pt is on appropriate dosage of Eliquis '5mg'$  BID for afib.  Will refill rx.

## 2022-02-04 ENCOUNTER — Other Ambulatory Visit: Payer: Self-pay | Admitting: Obstetrics and Gynecology

## 2022-02-04 DIAGNOSIS — Z1231 Encounter for screening mammogram for malignant neoplasm of breast: Secondary | ICD-10-CM

## 2022-02-10 ENCOUNTER — Encounter: Payer: Self-pay | Admitting: Podiatry

## 2022-02-10 ENCOUNTER — Ambulatory Visit: Payer: Medicare PPO | Admitting: Podiatry

## 2022-02-10 DIAGNOSIS — D689 Coagulation defect, unspecified: Secondary | ICD-10-CM

## 2022-02-10 DIAGNOSIS — L84 Corns and callosities: Secondary | ICD-10-CM | POA: Diagnosis not present

## 2022-02-10 DIAGNOSIS — M79675 Pain in left toe(s): Secondary | ICD-10-CM | POA: Diagnosis not present

## 2022-02-10 DIAGNOSIS — D6859 Other primary thrombophilia: Secondary | ICD-10-CM

## 2022-02-10 DIAGNOSIS — Q828 Other specified congenital malformations of skin: Secondary | ICD-10-CM

## 2022-02-10 DIAGNOSIS — M79674 Pain in right toe(s): Secondary | ICD-10-CM | POA: Diagnosis not present

## 2022-02-10 DIAGNOSIS — B351 Tinea unguium: Secondary | ICD-10-CM

## 2022-02-12 DIAGNOSIS — F411 Generalized anxiety disorder: Secondary | ICD-10-CM | POA: Diagnosis not present

## 2022-02-12 DIAGNOSIS — R634 Abnormal weight loss: Secondary | ICD-10-CM | POA: Diagnosis not present

## 2022-02-12 DIAGNOSIS — M810 Age-related osteoporosis without current pathological fracture: Secondary | ICD-10-CM | POA: Diagnosis not present

## 2022-02-12 DIAGNOSIS — Z23 Encounter for immunization: Secondary | ICD-10-CM | POA: Diagnosis not present

## 2022-02-12 DIAGNOSIS — J988 Other specified respiratory disorders: Secondary | ICD-10-CM | POA: Diagnosis not present

## 2022-02-14 NOTE — Progress Notes (Signed)
Subjective:  Patient ID: Kimberly [name], female    DOB: 21-Aug-1940,  MRN: [id number]  Kimberly [name] presents to clinic today for at risk foot care with h/o clotting disorder and painful porokeratotic lesion(s) right great toe and painful mycotic toenails that limit ambulation. Painful toenails interfere with ambulation. Aggravating factors include wearing enclosed shoe gear. Pain is relieved with periodic professional debridement. Painful porokeratotic lesions are aggravated when weightbearing with and without shoegear. Pain is relieved with periodic professional debridement.  She is accompanied by her husband and brother on today's visit.  New problem(s): None.   PCP is Lajean Manes, MD , and last visit was  August, 2023.  Allergies  Allergen Reactions   Amoxicillin-Pot Clavulanate Nausea Only, Swelling and Other (See Comments)    Has patient had a PCN reaction causing immediate rash, facial/tongue/throat swelling, SOB or lightheadedness with hypotension: No Has patient had a PCN reaction causing severe rash involving mucus membranes or skin necrosis: No Has patient had a PCN reaction that required hospitalization No Has patient had a PCN reaction occurring within the last 10 years: No If all of the above answers are "NO", then may proceed with Cephalosporin use.  Other reaction(s): nausea   Azithromycin Other (See Comments)    Pancreatitis Other reaction(s): Other, pancreatitis   Erythromycin Hives and Other (See Comments)    Reaction 1 time, when she was younger. Reaction 1 time, when she was younger.  Other reaction(s): hives   Almond Oil Other (See Comments)    Almonds: per allergy testing.   Amoxicillin Nausea Only   Atorvastatin Other (See Comments)    Muscle weakness  Muscle weakness  Other reaction(s): muscle weakness, Other   Codeine Nausea Only and Other (See Comments)    Other reaction(s): Nausea/Vomiting   Ketoconazole Nausea And Vomiting   Latex  Other (See Comments)    Other   Morphine Nausea Only    Other reaction(s): Unknown   Shellfish Allergy Other (See Comments)    Shellfish mix: per allergy testing.   Simvastatin Other (See Comments)    Muscle pain Other reaction(s): muscle pain, Other   Sulfamethoxazole-Trimethoprim Nausea Only    Upset stomach    Clindamycin/Lincomycin Nausea And Vomiting and Rash    Review of Systems: Negative except as noted in the HPI.  Objective: No changes noted in today's physical examination.  Kayna Suppa is a pleasant 81 y.o. female in NAD. AAO x 3.  Neurovascular Examination: CFT immediate b/l LE. Palpable DP/PT pulses b/l LE. Digital hair sparse b/l. Skin temperature gradient WNL b/l. No pain with calf compression b/l. No edema noted b/l. No cyanosis or clubbing noted b/l LE.  Protective sensation intact 5/5 intact bilaterally with 10g monofilament b/l. Vibratory sensation intact b/l. Proprioception intact bilaterally.  Dermatological:  Pedal skin with normal turgor, texture and tone b/l lower extremities. No open wounds b/l LE. No interdigital macerations noted b/l LE. Toenails 1-5 b/l elongated, discolored, dystrophic, thickened, crumbly with subungual debris and tenderness to dorsal palpation. Hyperkeratotic lesion(s) submet head 1 bilaterally.  No erythema, no edema, no drainage, no fluctuance. Porokeratotic lesion(s) lateral aspect R hallux and submet head 5 right. No erythema, no edema, no drainage, no fluctuance.  Musculoskeletal:  Muscle strength 5/5 to all lower extremity muscle groups bilaterally. HAV with bunion deformity noted b/l LE. Hammertoe deformity noted 2-5 b/l.  Assessment/Plan: 1. Pain due to onychomycosis of toenails of both feet   2. Porokeratosis   3. Callus   4. 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 -Toenails 1-5 b/l were debrided in length and girth with sterile nail nippers and dremel without iatrogenic bleeding.   -Callus(es) submet head 1 b/l pared utilizing sharp debridement with sterile blade without complication or incident. Total number debrided =2. -Porokeratotic lesion(s) R hallux and submet head 5 right foot pared and enucleated with sterile currette without incident. Total number of lesions debrided=2. -Patient/POA to call should there be question/concern in the interim.   Return in about 3 months (around 05/12/2022).  Marzetta Board, DPM

## 2022-02-16 ENCOUNTER — Ambulatory Visit (HOSPITAL_BASED_OUTPATIENT_CLINIC_OR_DEPARTMENT_OTHER): Payer: Medicare PPO | Admitting: Physical Therapy

## 2022-03-02 ENCOUNTER — Ambulatory Visit: Payer: Medicare PPO | Admitting: Allergy and Immunology

## 2022-03-02 ENCOUNTER — Other Ambulatory Visit: Payer: Self-pay | Admitting: Allergy and Immunology

## 2022-03-17 ENCOUNTER — Ambulatory Visit
Admission: RE | Admit: 2022-03-17 | Discharge: 2022-03-17 | Disposition: A | Discharge status: Home / Self Care | Payer: Medicare PPO | Source: Ambulatory Visit | Attending: Obstetrics and Gynecology | Admitting: Obstetrics and Gynecology

## 2022-03-17 DIAGNOSIS — K219 Gastro-esophageal reflux disease without esophagitis: Secondary | ICD-10-CM | POA: Diagnosis not present

## 2022-03-17 DIAGNOSIS — D649 Anemia, unspecified: Secondary | ICD-10-CM | POA: Diagnosis not present

## 2022-03-17 DIAGNOSIS — Z1231 Encounter for screening mammogram for malignant neoplasm of breast: Secondary | ICD-10-CM | POA: Diagnosis not present

## 2022-03-17 DIAGNOSIS — E039 Hypothyroidism, unspecified: Secondary | ICD-10-CM | POA: Diagnosis not present

## 2022-03-17 DIAGNOSIS — M810 Age-related osteoporosis without current pathological fracture: Secondary | ICD-10-CM | POA: Diagnosis not present

## 2022-03-17 DIAGNOSIS — J3089 Other allergic rhinitis: Secondary | ICD-10-CM | POA: Diagnosis not present

## 2022-03-23 DIAGNOSIS — H26492 Other secondary cataract, left eye: Secondary | ICD-10-CM | POA: Diagnosis not present

## 2022-04-05 DIAGNOSIS — L84 Corns and callosities: Secondary | ICD-10-CM | POA: Diagnosis not present

## 2022-04-05 DIAGNOSIS — R2689 Other abnormalities of gait and mobility: Secondary | ICD-10-CM | POA: Diagnosis not present

## 2022-04-05 DIAGNOSIS — M21619 Bunion of unspecified foot: Secondary | ICD-10-CM | POA: Diagnosis not present

## 2022-04-05 DIAGNOSIS — M7741 Metatarsalgia, right foot: Secondary | ICD-10-CM | POA: Diagnosis not present

## 2022-04-12 ENCOUNTER — Telehealth: Payer: Self-pay | Admitting: Cardiology

## 2022-04-12 DIAGNOSIS — G4733 Obstructive sleep apnea (adult) (pediatric): Secondary | ICD-10-CM

## 2022-04-12 NOTE — Telephone Encounter (Signed)
Pt would like a callback regarding sleep supplies. Please advise

## 2022-04-12 NOTE — Telephone Encounter (Signed)
Order placed to [facility] via community message. 

## 2022-04-16 DIAGNOSIS — R0989 Other specified symptoms and signs involving the circulatory and respiratory systems: Secondary | ICD-10-CM | POA: Diagnosis not present

## 2022-04-16 DIAGNOSIS — R052 Subacute cough: Secondary | ICD-10-CM | POA: Diagnosis not present

## 2022-04-20 ENCOUNTER — Ambulatory Visit: Payer: Medicare PPO | Admitting: Allergy and Immunology

## 2022-05-08 ENCOUNTER — Other Ambulatory Visit: Payer: Self-pay | Admitting: Cardiology

## 2022-05-12 ENCOUNTER — Telehealth: Payer: Self-pay | Admitting: Internal Medicine

## 2022-05-12 NOTE — Telephone Encounter (Signed)
She is not know to have diverticulosis which makes diverticulitis less likely.  At least there was no definitive diverticulosis on her last 2 colonoscopies in the last 2 CT scans of the abdomen. That said I cannot exclude diverticulitis Agree with MiraLAX and could even consider a MiraLAX purge with a half MiraLAX prep today If symptoms worsen that I would recommend CT scan even before her office visit Thanks

## 2022-05-12 NOTE — Telephone Encounter (Signed)
Patient is experiencing hard stools with stomach pains and is seeking relief. Please advise.

## 2022-05-12 NOTE — Telephone Encounter (Signed)
Pt called and is concerned, she is having pain in her LLQ that she describes as intense pressure. She got up this am and took a dose of miralax and felt some better. She has had no fever but reports the pain woke her up last night. Discussed with pt that she can take up to 3 doses/day to have BM. She has not been taking her lizess. She has an appt scheduled to see an APP 12/21. She is wondering if she might have diverticulitis. Please advise.

## 2022-05-12 NOTE — Telephone Encounter (Signed)
Spoke with pt and she is aware of Dr. Quentin Mulling recommendations.

## 2022-05-13 ENCOUNTER — Other Ambulatory Visit: Payer: Self-pay | Admitting: Internal Medicine

## 2022-05-13 DIAGNOSIS — M8580 Other specified disorders of bone density and structure, unspecified site: Secondary | ICD-10-CM

## 2022-05-13 DIAGNOSIS — K5909 Other constipation: Secondary | ICD-10-CM | POA: Diagnosis not present

## 2022-05-13 DIAGNOSIS — K219 Gastro-esophageal reflux disease without esophagitis: Secondary | ICD-10-CM | POA: Diagnosis not present

## 2022-05-13 DIAGNOSIS — I480 Paroxysmal atrial fibrillation: Secondary | ICD-10-CM | POA: Diagnosis not present

## 2022-05-13 DIAGNOSIS — D374 Neoplasm of uncertain behavior of colon: Secondary | ICD-10-CM | POA: Diagnosis not present

## 2022-05-18 ENCOUNTER — Ambulatory Visit: Payer: Medicare PPO | Admitting: Podiatry

## 2022-05-19 NOTE — Progress Notes (Signed)
05/20/2022 Kimberly [name] [id number] 1940/09/04  Referring provider: Merlene Laughter, MD Primary GI doctor: Dr. Rhea Belton  ASSESSMENT AND PLAN:   LLQ pain with constipation history, worsening with stress, no associated symptoms has improve with bowel purge.  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  Consider trulance, has not done well with linzess in the past and declines at this time Can refer to pelvic floor PT, declines at this time, taking care of her husband.  History of colonic polyps 2020, with 6 Ta's, patient is due. We had a long discussion about the purpose of colon cancer screening, the benefits and their risks. With shared decision making we have decided to proceed with the colonoscopy since patient appears to be in good health at this time and states her goal is to live to 89..  We have discussed the risks of bleeding, infection, perforation, medication reactions,  and remote risk of death associated with colonoscopy.  All questions were answered and the patient acknowledges these risk and wishes to proceed however she would like to see Dr. Mayford Knife before we schedule.  She also had chest pain 3 days after, unremarkable work up in the ER, possible trapped, air, can do gas X after procedure.   PAF (paroxysmal atrial fibrillation) (HCC) The patient would like to talk with Dr. Mayford Knife first prior to scheduling. No chest pain, no SOB, no symptoms at this time.  Patient is on Elquis, will have to hold med for 2 days prior to colonoscopy  Gastroesophageal reflux disease without esophagitis Lifestyle changes discussed, avoid NSAIDS, ETOH Continue on  the same medication, reports symptoms are well controlled.   Patient Care Team: Merlene Laughter, MD as PCP - General (Internal Medicine) Quintella Reichert, MD as PCP - Cardiology (Cardiology)  HISTORY OF PRESENT ILLNESS: 81 y.o. female with a past medical history of PAF on Eliquis, CTA 11/2018 no evidence of CAD, moderate OSA, hypothyroidism, bladder prolapse personal history of colonic polyps, GERD with small hiatal hernia, IBS C and others listed below presents for evaluation of AB pain and constipation.   01/25/2019 colonoscopy 8 polyps ranging from 2 to 5 mm in size. There was also external hemorrhoids and internal hemorrhoids found. These polyps were found to be tubular adenomas x6, benign lymphoid aggregate x2 recall August 2023.  Has been abdominal discomfort afterwards, went to ER, thought to be trapped air unremarkable evaluation.   04/2021 office visit with Dr. Rhea Belton for follow-up.   Struggle with incomplete evacuation and constipation.   Worse with stress.  Husband Kimberly [name] with cancer thyroid and prostate.   Given trial of Linzess. 05/12/2022 called with left lower quadrant abdominal pain/intense pressure no fever, but did have nocturnal pain, if continuing pain potential CT.  Was told to do miralax 3 x a day for 3 days, she said the second day her stool went from hard pellets to stringy stools.  She has more full Bm's in past two days but has stayed stringy.  The AB pain has improve, no nausea, vomiting, fever, chills.  She is doing miralax once or twice a day.  Her husband has been in and out of hospital for past year, he is home at  this time and she does not have help at this time.  Husband mobile but with walker and gets tired very easily.  She does have long term health care insurance and will try to get that activated next week.  She states her bladder is almost completely prolapsed.   CBC  04/30/2021  HGB 12.8 MCV 95 without evidence of anemia WBC 6.1 Platelets 228 Kidney  function 06/16/2021  BUN 16 Cr 0.80  GFR >60  Potassium 3.8   LFTs 11/05/2020  AST 23 ALT 15 Alkphos 46 TBili 0.5 05/11/2020 LIPASE 114  She  reports that she quit smoking about 47 years ago. Her smoking use included cigarettes. She has a 3.75 pack-year smoking history. She has never used smokeless tobacco. She reports that she does not drink alcohol and does not use drugs.  Current Medications:   Current Outpatient Medications (Endocrine & Metabolic):    levothyroxine (SYNTHROID) 100 MCG tablet, Take 100 mcg by mouth every morning.   SYNTHROID 125 MCG tablet,   Current Outpatient Medications (Cardiovascular):    diltiazem (CARDIZEM CD) 240 MG 24 hr capsule, TAKE 1 CAPSULE BY MOUTH EVERY DAY   EPINEPHrine 0.3 mg/0.3 mL IJ SOAJ injection, Inject 0.3 mg into the muscle as needed (for allergic reaction). Reported on 05/28/2015   rosuvastatin (CRESTOR) 10 MG tablet, Take 5 mg by mouth at bedtime.   metoprolol tartrate (LOPRESSOR) 25 MG tablet, SMARTSIG:1 By Mouth (Patient not taking: Reported on 05/20/2022)  Current Outpatient Medications (Respiratory):    albuterol (VENTOLIN HFA) 108 (90 Base) MCG/ACT inhaler, Inhale 2 puffs into the lungs every 6 (six) hours as needed for wheezing or shortness of breath.   azelastine (ASTELIN) 0.1 % nasal spray, Place 2 sprays into both nostrils 2 (two) times daily as needed for rhinitis. Use in each nostril as directed   CLARITIN 10 MG tablet,    fluticasone (FLONASE) 50 MCG/ACT nasal spray, Place 2 sprays into both nostrils 2 (two) times daily as needed for allergies.   montelukast (SINGULAIR) 10 MG tablet, TAKE 1 TABLET BY MOUTH EVERYDAY AT BEDTIME   PROAIR HFA 108 (90 Base) MCG/ACT inhaler, Inhale 1-2 puffs into the lungs every 6 (six) hours as needed for wheezing.  Current Outpatient Medications (Analgesics):    acetaminophen (TYLENOL) 500 MG tablet, Take 500 mg by mouth every 6 (six) hours as needed for moderate pain.   Current Outpatient  Medications (Hematological):    apixaban (ELIQUIS) 5 MG TABS tablet, TAKE 1 TABLET BY MOUTH TWICE A DAY  Current Outpatient Medications (Other):    bisacodyl (DULCOLAX) 5 MG EC tablet, Take 5 mg by mouth daily as needed for moderate constipation.   CALTRATE 600+D3 SOFT 600-20 MG-MCG CHEW, daily at 6 (six) AM.   Cholecalciferol 100 MCG (4000 UT) TABS, Take by mouth.   cycloSPORINE (RESTASIS) 0.05 % ophthalmic emulsion, Place 1 drop into both eyes 2 (two) times daily.   famotidine (PEPCID) 20 MG tablet, Take 20 mg by mouth 2 (two) times daily. OTC medication   LINZESS 145 MCG CAPS capsule,    meclizine (ANTIVERT) 12.5 MG tablet, Take 2 tablets by mouth as directed.   MIRALAX 17 GM/SCOOP powder, SMARTSIG:1 By Mouth   omeprazole (PRILOSEC) 20 MG capsule,    ondansetron (ZOFRAN ODT) 4 MG disintegrating tablet, Take 1 tablet (4 mg total) by mouth every 6 (six) hours as needed.   pantoprazole (PROTONIX) 20 MG tablet, SMARTSIG:1 By Mouth   Polyethyl Glycol-Propyl Glycol (SYSTANE) 0.4-0.3 % SOLN, Apply 2 drops  to eye daily as needed.   Probiotic Product (ALIGN) 4 MG CAPS, Take 1 capsule by mouth daily.   terconazole (TERAZOL 7) 0.4 % vaginal cream,    amoxicillin (AMOXIL) 500 MG capsule, SMARTSIG:4 Capsule(s) By Mouth Once (Patient not taking: Reported on 05/20/2022)   mupirocin ointment (BACTROBAN) 2 %, Apply 1 Application topically 2 (two) times daily. (Patient not taking: Reported on 05/20/2022)   psyllium (METAMUCIL) 58.6 % powder, Take 1 packet by mouth daily as needed (constipation). (Patient not taking: Reported on 05/20/2022)   tobramycin-dexamethasone (TOBRADEX) ophthalmic solution,    tretinoin (RETIN-A) 0.025 % cream, Apply topically. (Patient not taking: Reported on 05/20/2022)   triamcinolone cream (KENALOG) 0.1 %, Apply 1 Application topically 2 (two) times daily as needed. (Patient not taking: Reported on 05/20/2022)  Medical History:  Past Medical History:  Diagnosis Date   Allergy     Anal polyp 1998   Flex Sig    Anemia    Angular blepharitis of left eye    Ankle fracture    Stress fracture   Anxiety    Aortic atherosclerosis (HCC)    Asthma    border line has inhaler   Bunion    Cataracts, bilateral    Corneal scar    left eye   CPAP (continuous positive airway pressure) dependence    Degenerative disc disease    Depression    Diverticulosis of colon (without mention of hemorrhage) 2010   Colonoscopy   Dry eyes    bilateral   Enterocele    External hemorrhoids 2000   Colonoscopy   Family history of malignant neoplasm of gastrointestinal tract    Female cystocele    Fibromyalgia    GERD (gastroesophageal reflux disease)    Hiatal hernia 2005,2010   EGD   History of bronchitis    History of measles    History of mumps    History of strep sore throat    History of urinary tract infection    Hyperlipemia    Hypothyroidism    IBS (irritable bowel syndrome)    Imbalance    Internal hemorrhoids without mention of complication 1995,2005   Colonoscopy    Itching    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HCC)    Menopause    Migraine    OSA (obstructive sleep apnea) 05/14/2015   on BiPAP   Osteopenia 10/2013   T score -1.6 FRAX 10%/1.6%   PAF (paroxysmal atrial fibrillation) (HCC)    Pancreatitis    PCO (posterior capsular opacification)    left   Pneumonia    childhood illness   PONV (postoperative nausea and vomiting)    Pseudophakia, both eyes    PVD (posterior vitreous detachment) right   Rash    on back    Retinal scar    left   Rotator cuff disorder    pain, left shoulder   Shingles    Stricture and stenosis of esophagus 2005,2010   EGD    Stroke (HCC)    Ulcerative colitis (HCC)    Varicose veins    Vertigo    Wears glasses    Allergies:  Allergies  Allergen Reactions   Amoxicillin-Pot Clavulanate Nausea Only, Swelling and Other (See Comments)    Has patient had a PCN reaction causing immediate rash, facial/tongue/throat swelling, SOB or  lightheadedness with hypotension: No Has patient had a PCN reaction causing severe rash involving mucus membranes or skin necrosis: No Has patient had a PCN reaction that required hospitalization No Has patient  had a PCN reaction occurring within the last 10 years: No If all of the above answers are "NO", then may proceed with Cephalosporin use.  Other reaction(s): nausea   Azithromycin Other (See Comments)    Pancreatitis Other reaction(s): Other, pancreatitis   Erythromycin Hives and Other (See Comments)    Reaction 1 time, when she was younger. Reaction 1 time, when she was younger.  Other reaction(s): hives   Almond Oil Other (See Comments)    Almonds: per allergy testing.   Amoxicillin Nausea Only   Atorvastatin Other (See Comments)    Muscle weakness  Muscle weakness  Other reaction(s): muscle weakness, Other   Codeine Nausea Only and Other (See Comments)    Other reaction(s): Nausea/Vomiting   Ketoconazole Nausea And Vomiting   Latex Other (See Comments)    Other   Morphine Nausea Only    Other reaction(s): Unknown   Shellfish Allergy Other (See Comments)    Shellfish mix: per allergy testing.   Simvastatin Other (See Comments)    Muscle pain Other reaction(s): muscle pain, Other   Sulfamethoxazole-Trimethoprim Nausea Only    Upset stomach    Clindamycin/Lincomycin Nausea And Vomiting and Rash     Surgical History:  She  has a past surgical history that includes Total abdominal hysterectomy w/ bilateral salpingoophorectomy (1991); Tonsillectomy; Replacement total knee (Right, 2011); Eye surgery (Left); Total knee arthroplasty; Cataract extraction (Bilateral, 2013); Nasal septum surgery; Mouth surgery (11/13/11); Colonoscopy; Nasal septum surgery (1970's); Esophageal dilation; Bunionectomy (2014); Anal fissure repair; Foot surgery; Hemorrhoid surgery; Abdominal hysterectomy; Total hip arthroplasty; Total knee arthroplasty (Left, 06/23/2015); and Breast excisional biopsy  (Left). Family History:  Her family history includes Alcohol abuse in her father; Allergies in her sister; Allergy (severe) in her sister; Alzheimer's disease in her mother; Arthritis in her brother; Breast cancer (age of onset: 17) in her sister; Colon cancer in her paternal aunt; Colon polyps in her brother and sister; Congestive Heart Failure in her father; Dementia in her mother; Diabetes in her sister; Heart attack in her maternal grandfather, maternal grandmother, and paternal grandfather; Heart disease in her maternal grandmother; Hypertension in her father; Irritable bowel syndrome in her mother; Other in her sister and sister; Stroke in her mother.  REVIEW OF SYSTEMS  : All other systems reviewed and negative except where noted in the History of Present Illness.  PHYSICAL EXAM: BP 110/82   Pulse 62   Ht 5' 7.5" (1.715 m)   Wt 155 lb (70.3 kg)   LMP  (LMP Unknown)   BMI 23.92 kg/m  General:   Pleasant, well developed female in no acute distress Head:   Normocephalic and atraumatic. Eyes:  sclerae anicteric,conjunctive pink  Heart:   regular rate and rhythm Pulm:  Clear anteriorly; no wheezing Abdomen:   Soft, Obese AB, Active bowel sounds. mild tenderness in the LLQ. Without guarding and Without rebound, No organomegaly appreciated. Rectal: Not evaluated Extremities:  Without edema. Msk: Symmetrical without gross deformities. Peripheral pulses intact.  Neurologic:  Alert and  oriented x4;  No focal deficits.  Skin:   Dry and intact without significant lesions or rashes. Psychiatric:  Cooperative. Normal mood and affect.  RELEVANT LABS AND IMAGING: CBC    Component Value Date/Time   WBC 6.1 04/30/2021 1100   WBC 6.6 11/05/2020 0225   RBC 4.04 04/30/2021 1100   RBC 4.25 11/05/2020 0225   HGB 12.8 04/30/2021 1100   HCT 38.3 04/30/2021 1100   PLT 228 04/30/2021 1100   MCV  95 04/30/2021 1100   MCH 31.7 04/30/2021 1100   MCH 31.3 11/05/2020 0225   MCHC 33.4 04/30/2021 1100    MCHC 33.0 11/05/2020 0225   RDW 11.9 04/30/2021 1100   LYMPHSABS 2.6 11/05/2020 0225   MONOABS 0.7 11/05/2020 0225   EOSABS 0.1 11/05/2020 0225   BASOSABS 0.0 11/05/2020 0225    CMP     Component Value Date/Time   NA 140 06/16/2021 0723   K 3.8 06/16/2021 0723   CL 103 06/16/2021 0723   CO2 25 06/16/2021 0723   GLUCOSE 81 06/16/2021 0723   GLUCOSE 100 (H) 11/05/2020 0225   BUN 16 06/16/2021 0723   CREATININE 0.80 06/16/2021 0723   CALCIUM 9.5 06/16/2021 0723   PROT 6.9 11/05/2020 0225   ALBUMIN 3.9 11/05/2020 0225   AST 23 11/05/2020 0225   ALT 15 11/05/2020 0225   ALKPHOS 46 11/05/2020 0225   BILITOT 0.5 11/05/2020 0225   GFRNONAA >60 11/05/2020 0225   GFRAA >60 01/28/2019 0050     Doree Albee, PA-C 9:40 AM

## 2022-05-20 ENCOUNTER — Encounter: Payer: Self-pay | Admitting: Physician Assistant

## 2022-05-20 ENCOUNTER — Telehealth: Payer: Self-pay

## 2022-05-20 ENCOUNTER — Other Ambulatory Visit (INDEPENDENT_AMBULATORY_CARE_PROVIDER_SITE_OTHER): Payer: Medicare PPO

## 2022-05-20 ENCOUNTER — Ambulatory Visit: Payer: Medicare PPO | Admitting: Physician Assistant

## 2022-05-20 ENCOUNTER — Other Ambulatory Visit: Payer: Self-pay

## 2022-05-20 ENCOUNTER — Other Ambulatory Visit: Payer: Self-pay | Admitting: Physician Assistant

## 2022-05-20 VITALS — BP 110/82 | HR 62 | Ht 67.5 in | Wt 155.0 lb

## 2022-05-20 DIAGNOSIS — R1032 Left lower quadrant pain: Secondary | ICD-10-CM

## 2022-05-20 DIAGNOSIS — K581 Irritable bowel syndrome with constipation: Secondary | ICD-10-CM

## 2022-05-20 DIAGNOSIS — I480 Paroxysmal atrial fibrillation: Secondary | ICD-10-CM | POA: Diagnosis not present

## 2022-05-20 DIAGNOSIS — K219 Gastro-esophageal reflux disease without esophagitis: Secondary | ICD-10-CM | POA: Diagnosis not present

## 2022-05-20 DIAGNOSIS — Z86010 Personal history of colonic polyps: Secondary | ICD-10-CM | POA: Diagnosis not present

## 2022-05-20 LAB — COMPREHENSIVE METABOLIC PANEL
ALT: 12 U/L (ref 0–35)
AST: 19 U/L (ref 0–37)
Albumin: 4.2 g/dL (ref 3.5–5.2)
Alkaline Phosphatase: 40 U/L (ref 39–117)
BUN: 14 mg/dL (ref 6–23)
CO2: 27 mEq/L (ref 19–32)
Calcium: 9.7 mg/dL (ref 8.4–10.5)
Chloride: 103 mEq/L (ref 96–112)
Creatinine, Ser: 0.76 mg/dL (ref 0.40–1.20)
GFR: 73.71 mL/min (ref >60.00)
Glucose, Bld: 91 mg/dL (ref 70–99)
Potassium: 3.7 mEq/L (ref 3.5–5.1)
Sodium: 140 mEq/L (ref 135–145)
Total Bilirubin: 0.4 mg/dL (ref 0.2–1.2)
Total Protein: 7.2 g/dL (ref 6.0–8.3)

## 2022-05-20 LAB — CBC WITH DIFFERENTIAL/PLATELET
Basophils Absolute: 0 10*3/uL (ref 0.0–0.1)
Basophils Relative: 0.5 % (ref 0.0–3.0)
Eosinophils Absolute: 0 10*3/uL (ref 0.0–0.7)
Eosinophils Relative: 0.7 % (ref 0.0–5.0)
HCT: 40.6 % (ref 36.0–46.0)
Hemoglobin: 13.7 g/dL (ref 12.0–15.0)
Lymphocytes Relative: 26.5 % (ref 12.0–46.0)
Lymphs Abs: 1.6 10*3/uL (ref 0.7–4.0)
MCHC: 33.8 g/dL (ref 30.0–36.0)
MCV: 94.6 fl (ref 78.0–100.0)
Monocytes Absolute: 0.5 10*3/uL (ref 0.1–1.0)
Monocytes Relative: 8.8 % (ref 3.0–12.0)
Neutro Abs: 3.8 10*3/uL (ref 1.4–7.7)
Neutrophils Relative %: 63.5 % (ref 43.0–77.0)
Platelets: 251 10*3/uL (ref 150.0–400.0)
RBC: 4.29 Mil/uL (ref 3.87–5.11)
RDW: 12.9 % (ref 11.5–15.5)
WBC: 6 10*3/uL (ref 4.0–10.5)

## 2022-05-20 LAB — TSH: TSH: 5.65 u[IU]/mL — ABNORMAL HIGH (ref 0.35–5.50)

## 2022-05-20 LAB — SEDIMENTATION RATE: Sed Rate: 16 mm/hr (ref 0–30)

## 2022-05-20 MED ORDER — NA SULFATE-K SULFATE-MG SULF 17.5-3.13-1.6 GM/177ML PO SOLN: 1.0000 | Freq: Once | ORAL | 0 refills | Status: DC

## 2022-05-20 NOTE — Progress Notes (Signed)
Addendum: Reviewed and agree with assessment and management plan. Abrie Egloff M, MD  

## 2022-05-20 NOTE — Telephone Encounter (Signed)
Patient cancelled procedure, will call back to reschedule.

## 2022-05-20 NOTE — Patient Instructions (Addendum)
It has been recommended to you by your physician that you have a(n) colonoscopy and endoscopy completed. Per your request, we did not schedule the procedure(s) today. Please contact our office at [phone] should you decide to have the procedure completed. You will be scheduled for a pre-visit and procedure at that time.   Your provider has requested that you go to the basement level for lab work before leaving today. Press "B" on the elevator. The lab is located at the first door on the left as you exit the elevator.   Miralax is an osmotic laxative.  It only brings more water into the stool.  This is safe to take daily.  Can take up to 17 gram of miralax twice a day.  Mix with juice or coffee.  Start 1 capful at night for 3-4 days and reassess your response in 3-4 days.  You can increase and decrease the dose based on your response.  Remember, it can take up to 3-4 days to take effect OR for the effects to wear off.   I often pair this with benefiber or citracel in the morning to help assure the stool is not too loose.  Just start with once a day, 1 TBSP  - Drink at least 64-80 ounces of water/liquid per day. - Establish a time to try to move your bowels every day.  For many people, this is after a cup of coffee or after a meal such as breakfast. - Sit all of the way back on the toilet keeping your back fairly straight and while sitting up, try to rest the tops of your forearms on your upper thighs.   - Raising your feet with a step stool/squatty potty can be helpful to improve the angle that allows your stool to pass through the rectum. - Relax the rectum feeling it bulge toward the toilet water.  If you feel your rectum raising toward your body, you are contracting rather than relaxing. - Breathe in and slowly exhale. "Belly breath" by expanding your belly towards your belly button. Keep belly expanded as you gently direct pressure down and back to the anus.  A low pitched GRRR sound can  assist with increasing intra-abdominal pressure.  - Repeat 3-4 times. If unsuccessful, contract the pelvic floor to restore normal tone and get off the toilet.  Avoid excessive straining. - To reduce excessive wiping by teaching your anus to normally contract, place hands on outer aspect of knees and resist knee movement outward.  Hold 5-10 second then place hands just inside of knees and resist inward movement of knees.  Hold 5 seconds.  Repeat a few times each way.  Go to the ER if unable to pass gas, severe AB pain, unable to hold down food, any shortness of breath of chest pain.

## 2022-05-21 ENCOUNTER — Telehealth: Payer: Self-pay | Admitting: Physician Assistant

## 2022-05-21 NOTE — Telephone Encounter (Signed)
Patient requested that her labs also be sent to Dr. Levada Dy with [facility] & a copy to her home address since she is unable to access mychart currently. Results faxed & letter mailed home.

## 2022-05-21 NOTE — Telephone Encounter (Signed)
Left message for patient to call back  

## 2022-05-21 NOTE — Telephone Encounter (Signed)
Patient returned call

## 2022-05-25 ENCOUNTER — Telehealth: Payer: Self-pay

## 2022-05-25 NOTE — Telephone Encounter (Signed)
Patient called in regarding needing clearance to hold eliquis for colonoscopy which is scheduled for early February. Appointment set with APP for 06/22/21 at patient's request.

## 2022-06-16 ENCOUNTER — Ambulatory Visit: Payer: Medicare PPO | Attending: Cardiology | Admitting: Cardiology

## 2022-06-16 ENCOUNTER — Other Ambulatory Visit: Payer: Self-pay | Admitting: Allergy and Immunology

## 2022-06-16 ENCOUNTER — Encounter: Payer: Self-pay | Admitting: Cardiology

## 2022-06-16 VITALS — BP 122/78 | HR 70 | Ht 67.5 in | Wt 154.4 lb

## 2022-06-16 DIAGNOSIS — G4733 Obstructive sleep apnea (adult) (pediatric): Secondary | ICD-10-CM | POA: Diagnosis not present

## 2022-06-16 DIAGNOSIS — I480 Paroxysmal atrial fibrillation: Secondary | ICD-10-CM | POA: Diagnosis not present

## 2022-06-16 NOTE — Progress Notes (Signed)
Cardiology Office Note:    Date:  06/16/2022   ID:  Kimberly [name], DOB 1940-07-10, MRN [id number]  PCP:  Corliss Blacker, MD  Cardiologist:  Fransico Him, MD    Referring MD: Corliss Blacker, MD   Chief Complaint  Patient presents with   Sleep Apnea   Atrial Fibrillation    History of Present Illness:    Kimberly [name] is a 82 y.o. female with a hx of PAF on metoprolol and Eliquis for chads vas score of 5.  Metoprolol decreased by [facility] because of bradycardia.  She also has a history of chest pain with normal nuclear stress test and syncope felt secondary to dehydration.  She has a chronic RBBB. Due to chest pain a coronary CTA was done 11/2018 and showed  a calcium score of 0 and no evidence of CAD.  She has moderate OSA with an AHI of 16/hr on HST.    She is here today for followup and is doing well.  She denies any chest pain or pressure, SOB, DOE, PND, orthopnea, dizziness or syncope. She does have some LE edema at times if she stays on her feet too long. She did have an episode of PAF yesterday in the setting of a stressful event with her husband.  She is compliant with her meds and is tolerating meds with no SE.    She is doing well with her PAP device after just starting back on it a week ago after being off of it while her husband was in the hospital.  She tolerates the mask and feels the pressure is adequate.  Since going on PAP she feels rested in the am and has no significant daytime sleepiness.  She denies any significant nasal dryness or nasal congestion but her mouth is really getting dry.   She does not think that he snores.    Past Medical History:  Diagnosis Date   Allergy    Anal polyp 1998   Flex Sig    Anemia    Angular blepharitis of left eye    Ankle fracture    Stress fracture   Anxiety    Aortic atherosclerosis (HCC)    Asthma    border line has inhaler   Bunion    Cataracts, bilateral    Corneal scar    left eye   CPAP  (continuous positive airway pressure) dependence    Degenerative disc disease    Depression    Diverticulosis of colon (without mention of hemorrhage) 2010   Colonoscopy   Dry eyes    bilateral   Enterocele    External hemorrhoids 2000   Colonoscopy   Family history of malignant neoplasm of gastrointestinal tract    Female cystocele    Fibromyalgia    GERD (gastroesophageal reflux disease)    Hiatal hernia 2005,2010   EGD   History of bronchitis    History of measles    History of mumps    History of strep sore throat    History of urinary tract infection    Hyperlipemia    Hypothyroidism    IBS (irritable bowel syndrome)    Imbalance    Internal hemorrhoids without mention of complication 0814,4818   Colonoscopy    Itching    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HCC)    Menopause    Migraine    OSA (obstructive sleep apnea) 05/14/2015   on BiPAP   Osteopenia 10/2013   T score -1.6 FRAX 10%/1.6%  PAF (paroxysmal atrial fibrillation) (HCC)    Pancreatitis    PCO (posterior capsular opacification)    left   Pneumonia    childhood illness   PONV (postoperative nausea and vomiting)    Pseudophakia, both eyes    PVD (posterior vitreous detachment) right   Rash    on back    Retinal scar    left   Rotator cuff disorder    pain, left shoulder   Shingles    Stricture and stenosis of esophagus 2005,2010   EGD    Stroke ([place])    Ulcerative colitis ([place])    Varicose veins    Vertigo    Wears glasses     Past Surgical History:  Procedure Laterality Date   ABDOMINAL HYSTERECTOMY     ANAL FISSURE REPAIR     BREAST EXCISIONAL BIOPSY Left    BUNIONECTOMY  2014   CATARACT EXTRACTION Bilateral 2013   COLONOSCOPY     polyp removed   ESOPHAGEAL DILATION     EYE SURGERY Left    FOOT SURGERY     left    HEMORRHOID SURGERY     MOUTH SURGERY  11/13/11   Cyst removed from [place]  1970's   REPLACEMENT TOTAL KNEE Right 2011    TONSILLECTOMY     TOTAL ABDOMINAL HYSTERECTOMY W/ BILATERAL SALPINGOOPHORECTOMY  1991   TAH [place]     right    TOTAL KNEE ARTHROPLASTY     both   TOTAL KNEE ARTHROPLASTY Left 06/23/2015   Procedure: LEFT TOTAL KNEE ARTHROPLASTY;  Surgeon: Gaynelle Arabian, MD;  Location: [facility];  Service: Orthopedics;  Laterality: Left;    Current Medications: Current Meds  Medication Sig   acetaminophen (TYLENOL) 500 MG tablet Take 500 mg by mouth every 6 (six) hours as needed for moderate pain.    albuterol (VENTOLIN HFA) 108 (90 Base) MCG/ACT inhaler Inhale 2 puffs into the lungs every 6 (six) hours as needed for wheezing or shortness of breath.   apixaban (ELIQUIS) 5 MG TABS tablet TAKE 1 TABLET BY MOUTH TWICE A DAY   azelastine (ASTELIN) 0.1 % nasal spray Place 2 sprays into both nostrils 2 (two) times daily as needed for rhinitis. Use in each nostril as directed   bisacodyl (DULCOLAX) 5 MG EC tablet Take 5 mg by mouth daily as needed for moderate constipation.   CALTRATE 600+D3 SOFT 600-20 MG-MCG CHEW daily at 6 (six) AM.   Cholecalciferol 100 MCG (4000 UT) TABS Take by mouth.   CLARITIN 10 MG tablet    cycloSPORINE (RESTASIS) 0.05 % ophthalmic emulsion Place 1 drop into both eyes 2 (two) times daily.   diltiazem (CARDIZEM CD) 240 MG 24 hr capsule TAKE 1 CAPSULE BY MOUTH EVERY DAY   EPINEPHrine 0.3 mg/0.3 mL IJ SOAJ injection Inject 0.3 mg into the muscle as needed (for allergic reaction). Reported on 05/28/2015   famotidine (PEPCID) 20 MG tablet Take 20 mg by mouth 2 (two) times daily. OTC medication   fluticasone (FLONASE) 50 MCG/ACT nasal spray PLACE 2 SPRAYS INTO BOTH NOSTRILS 2 (TWO) TIMES DAILY AS NEEDED FOR ALLERGIES.   levothyroxine (SYNTHROID) 100 MCG tablet Take 100 mcg by mouth every morning.   LINZESS 145 MCG CAPS capsule    meclizine (ANTIVERT) 12.5 MG tablet Take 2 tablets by mouth as directed.   metoprolol tartrate (LOPRESSOR) 25 MG tablet    MIRALAX  17 GM/SCOOP  powder SMARTSIG:1 By Mouth   montelukast (SINGULAIR) 10 MG tablet TAKE 1 TABLET BY MOUTH EVERYDAY AT BEDTIME   mupirocin ointment (BACTROBAN) 2 % Apply 1 Application topically 2 (two) times daily.   omeprazole (PRILOSEC) 20 MG capsule    ondansetron (ZOFRAN ODT) 4 MG disintegrating tablet Take 1 tablet (4 mg total) by mouth every 6 (six) hours as needed.   pantoprazole (PROTONIX) 20 MG tablet SMARTSIG:1 By Mouth   Polyethyl Glycol-Propyl Glycol (SYSTANE) 0.4-0.3 % SOLN Apply 2 drops to eye daily as needed.   PROAIR HFA 108 (90 Base) MCG/ACT inhaler Inhale 1-2 puffs into the lungs every 6 (six) hours as needed for wheezing.   Probiotic Product (ALIGN) 4 MG CAPS Take 1 capsule by mouth daily.   psyllium (METAMUCIL) 58.6 % powder Take 1 packet by mouth daily as needed (constipation).   rosuvastatin (CRESTOR) 10 MG tablet Take 5 mg by mouth at bedtime.   SYNTHROID 125 MCG tablet    terconazole (TERAZOL 7) 0.4 % vaginal cream    tobramycin-dexamethasone (TOBRADEX) ophthalmic solution    tretinoin (RETIN-A) 0.025 % cream Apply topically.   triamcinolone cream (KENALOG) 0.1 % Apply 1 Application topically 2 (two) times daily as needed.   [DISCONTINUED] amoxicillin (AMOXIL) 500 MG capsule      Allergies:   Amoxicillin-pot clavulanate, Azithromycin, Erythromycin, Almond oil, Amoxicillin, Atorvastatin, Codeine, Ketoconazole, Latex, Morphine, Shellfish allergy, Simvastatin, Sulfamethoxazole-trimethoprim, and Clindamycin/lincomycin   Social History   Socioeconomic History   Marital status: Married    Spouse name: Not on file   Number of children: 1   Years of education: bus.school   Highest education level: Not on file  Occupational History   Occupation: Retired    Fish farm manager: RETIRED  Tobacco Use   Smoking status: Former    Packs/day: 0.25    Years: 15.00    Total pack years: 3.75    Types: Cigarettes    Quit date: 06/01/1975    Years since quitting: 47.0   Smokeless tobacco: Never    Tobacco comments:    Stopped smoking age 31  Vaping Use   Vaping Use: Never used  Substance and Sexual Activity   Alcohol use: No    Alcohol/week: 0.0 standard drinks of alcohol   Drug use: No   Sexual activity: Never    Birth control/protection: Surgical    Comment: HYST, first intercourse >16, sexual partner less than 5  Other Topics Concern   Not on file  Social History Narrative   Daily caffeine    Social Determinants of Health   Financial Resource Strain: Not on file  Food Insecurity: Not on file  Transportation Needs: Not on file  Physical Activity: Not on file  Stress: Not on file  Social Connections: Not on file     Family History: The patient's family history includes Alcohol abuse in her father; Allergies in her sister; Allergy (severe) in her sister; Alzheimer's disease in her mother; Arthritis in her brother; Breast cancer (age of onset: 1) in her sister; Colon cancer in her paternal aunt; Colon polyps in her brother and sister; Congestive Heart Failure in her father; Dementia in her mother; Diabetes in her sister; Heart attack in her maternal grandfather, maternal grandmother, and paternal grandfather; Heart disease in her maternal grandmother; Hypertension in her father; Irritable bowel syndrome in her mother; Other in her sister and sister; Stroke in her mother. There is no history of Esophageal cancer, Rectal cancer, or Stomach cancer.  ROS:  Please see the history of present illness.    ROS  All other systems reviewed and negative.   EKGs/Labs/Other Studies Reviewed:    The following studies were reviewed today: none  EKG:  EKG is not ordered today.   Recent Labs: 05/20/2022: ALT 12; BUN 14; Creatinine, Ser 0.76; Hemoglobin 13.7; Platelets 251.0; Potassium 3.7; Sodium 140; TSH 5.65   Recent Lipid Panel    Component Value Date/Time   CHOL 124 07/23/2013 0632   TRIG 69 07/23/2013 0632   HDL 68 07/23/2013 0632   CHOLHDL 1.8 07/23/2013 0632   VLDL 14  07/23/2013 0632   LDLCALC 42 07/23/2013 6295    Physical Exam:    VS:  BP 122/78   Pulse 70   Ht 5' 7.5" (1.715 m)   Wt 154 lb 6.4 oz (70 kg)   LMP  (LMP Unknown)   SpO2 95%   BMI 23.83 kg/m     Wt Readings from Last 3 Encounters:  06/16/22 154 lb 6.4 oz (70 kg)  05/20/22 155 lb (70.3 kg)  11/24/21 162 lb (73.5 kg)    GEN: Well nourished, well developed in no acute distress HEENT: Normal NECK: No JVD; No carotid bruits LYMPHATICS: No lymphadenopathy CARDIAC:RRR, no murmurs, rubs, gallops RESPIRATORY:  Clear to auscultation without rales, wheezing or rhonchi  ABDOMEN: Soft, non-tender, non-distended MUSCULOSKELETAL:  No edema; No deformity  SKIN: Warm and dry NEUROLOGIC:  Alert and oriented x 3 PSYCHIATRIC:  Normal affect  ASSESSMENT:    1. PAF (paroxysmal atrial fibrillation) ([place])   2. OSA (obstructive sleep apnea)    PLAN:    In order of problems listed above:  1.  PAF  -She is maintaining NSR on exam and denies any palpitations unless she gets in a stressful situation -she denies any bleeding issues on DOAC -continue prescription drug management with Eliquis '5mg'$  BID and Cardizem CD '240mg'$  daily with PRN refills -I have personally reviewed and interpreted outside labs performed by patient's PCP which showed SCR 0.76 and K+ 3.7 and Hbg 13.7 on 12.21.2023  2.  OSA - The patient is tolerating PAP therapy well without any problems. The PAP download performed by his DME was personally reviewed and interpreted by me today and showed an AHI of 9.5/hr on autp BiPAP with IPAP max 18cm H2O and EPAP min 5 cm H2O with 57% compliance in using more than 4 hours nightly.  The patient has been using and benefiting from PAP use and will continue to benefit from therapy.  -I encouraged her to be more compliant with her device -her AHI is elevated but due to a mask leak and we have not really been able to get an adequate seal -I encouraged her to increase the humidity on her  device  3.  Leg cramps -these occur at the end of the day after she has been on her feet a lot -it starts in her buttocks and goes the whole way down her leg into her heel and foot and sounds like sciatic pain -recent labs by PCP were normal -I have recommended she see her PCP for this  Medication Adjustments/Labs and Tests Ordered: Current medicines are reviewed at length with the patient today.  Concerns regarding medicines are outlined above.  Orders Placed This Encounter  Procedures   EKG 12-Lead   No orders of the defined types were placed in this encounter.   Signed, Fransico Him, MD  06/16/2022 11:47 AM    [place]

## 2022-06-16 NOTE — Patient Instructions (Signed)
Medication Instructions:  Your physician recommends that you continue on your current medications as directed. Please refer to the Current Medication list given to you today.  *If you need a refill on your cardiac medications before your next appointment, please call your pharmacy*   Lab Work: None. If you have labs (blood work) drawn today and your tests are completely normal, you will receive your results only by: [place] (if you have MyChart) OR A paper copy in the mail If you have any lab test that is abnormal or we need to change your treatment, we will call you to review the results.   Testing/Procedures: None.   Follow-Up: At [facility], you and your health needs are our priority.  As part of our continuing mission to provide you with exceptional heart care, we have created designated Provider Care Teams.  These Care Teams include your primary Cardiologist (physician) and Advanced Practice Providers (APPs -  Physician Assistants and Nurse Practitioners) who all work together to provide you with the care you need, when you need it.  We recommend signing up for the patient portal called "MyChart".  Sign up information is provided on this After Visit Summary.  MyChart is used to connect with patients for Virtual Visits (Telemedicine).  Patients are able to view lab/test results, encounter notes, upcoming appointments, etc.  Non-urgent messages can be sent to your provider as well.   To learn more about what you can do with MyChart, go to [email or web address].    Your next appointment:   6 month(s)  Provider:   Fransico Him, MD

## 2022-06-17 ENCOUNTER — Telehealth: Payer: Self-pay | Admitting: Cardiology

## 2022-06-17 NOTE — Telephone Encounter (Signed)
Patient has an appointment with Christen Bame for cardiac clearance on 06/22/22 at 1:55 pm. Patient agrees to keep this appointment.

## 2022-06-17 NOTE — Telephone Encounter (Signed)
Patient called to talk with Dr. Radford Pax or nurse about appt. Wants to know if appt is still necessary even though they have not found out when the procedure will be.

## 2022-06-20 NOTE — Progress Notes (Deleted)
Cardiology Office Note:    Date:  06/20/2022   ID:  [name], DOB 02-Oct-1940, MRN [id number]  PCP:  Corliss Blacker, MD   [facility] Providers Cardiologist:  Fransico Him, MD { Click to update primary MD,subspecialty MD or APP then REFRESH:1}    Referring MD: Corliss Blacker, MD   Chief Complaint: ***  History of Present Illness:    Kimberly [name] is a *** 82 y.o. female with a hx of PAF on chronic anticoagulation, chest pain with normal nuclear stress test and coronary CTA with calcium score of 0 and no evidence of CAD on 11/2018, moderate OSA with HAI 16/hr, chronic RBBB  Last cardiology clinic visit was 06/16/2022 with Dr. Radford Pax  Past Medical History:  Diagnosis Date   Allergy    Anal polyp 1998   Flex Sig    Anemia    Angular blepharitis of left eye    Ankle fracture    Stress fracture   Anxiety    Aortic atherosclerosis ([place])    Asthma    border line has inhaler   Bunion    Cataracts, bilateral    Corneal scar    left eye   CPAP (continuous positive airway pressure) dependence    Degenerative disc disease    Depression    Diverticulosis of colon (without mention of hemorrhage) 2010   Colonoscopy   Dry eyes    bilateral   Enterocele    External hemorrhoids 2000   Colonoscopy   Family history of malignant neoplasm of gastrointestinal tract    Female cystocele    Fibromyalgia    GERD (gastroesophageal reflux disease)    Hiatal hernia 2005,2010   EGD   History of bronchitis    History of measles    History of mumps    History of strep sore throat    History of urinary tract infection    Hyperlipemia    Hypothyroidism    IBS (irritable bowel syndrome)    Imbalance    Internal hemorrhoids without mention of complication 0000000   Colonoscopy    Itching    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[place])    Menopause    Migraine    OSA (obstructive sleep apnea) 05/14/2015   on BiPAP   Osteopenia 10/2013   T score -1.6 FRAX 10%/1.6%   PAF  (paroxysmal atrial fibrillation) (HCC)    Pancreatitis    PCO (posterior capsular opacification)    left   Pneumonia    childhood illness   PONV (postoperative nausea and vomiting)    Pseudophakia, both eyes    PVD (posterior vitreous detachment) right   Rash    on back    Retinal scar    left   Rotator cuff disorder    pain, left shoulder   Shingles    Stricture and stenosis of esophagus 2005,2010   EGD    Stroke ([place])    Ulcerative colitis ([place])    Varicose veins    Vertigo    Wears glasses     Past Surgical History:  Procedure Laterality Date   ABDOMINAL HYSTERECTOMY     ANAL FISSURE REPAIR     BREAST EXCISIONAL BIOPSY Left    BUNIONECTOMY  2014   CATARACT EXTRACTION Bilateral 2013   COLONOSCOPY     polyp removed   ESOPHAGEAL DILATION     EYE SURGERY Left    FOOT SURGERY     left    HEMORRHOID SURGERY  MOUTH SURGERY  11/13/11   Cyst removed from [place]  1970's   REPLACEMENT TOTAL KNEE Right 2011   TONSILLECTOMY     TOTAL ABDOMINAL HYSTERECTOMY W/ BILATERAL SALPINGOOPHORECTOMY  1991   TAH [facility]   TOTAL HIP ARTHROPLASTY     right    TOTAL KNEE ARTHROPLASTY     both   TOTAL KNEE ARTHROPLASTY Left 06/23/2015   Procedure: LEFT TOTAL KNEE ARTHROPLASTY;  Surgeon: Gaynelle Arabian, MD;  Location: [facility];  Service: Orthopedics;  Laterality: Left;    Current Medications: No outpatient medications have been marked as taking for the 06/22/22 encounter (Appointment) with Ann Maki, Lanice Schwab, NP.     Allergies:   Amoxicillin-pot clavulanate, Azithromycin, Erythromycin, Almond oil, Amoxicillin, Atorvastatin, Codeine, Ketoconazole, Latex, Morphine, Shellfish allergy, Simvastatin, Sulfamethoxazole-trimethoprim, and Clindamycin/lincomycin   Social History   Socioeconomic History   Marital status: Married    Spouse name: Not on file   Number of children: 1   Years of education: bus.school   Highest education level:  Not on file  Occupational History   Occupation: Retired    Fish farm manager: RETIRED  Tobacco Use   Smoking status: Former    Packs/day: 0.25    Years: 15.00    Total pack years: 3.75    Types: Cigarettes    Quit date: 06/01/1975    Years since quitting: 47.0   Smokeless tobacco: Never   Tobacco comments:    Stopped smoking age 89  Vaping Use   Vaping Use: Never used  Substance and Sexual Activity   Alcohol use: No    Alcohol/week: 0.0 standard drinks of alcohol   Drug use: No   Sexual activity: Never    Birth control/protection: Surgical    Comment: HYST, first intercourse >16, sexual partner less than 5  Other Topics Concern   Not on file  Social History Narrative   Daily caffeine    Social Determinants of Health   Financial Resource Strain: Not on file  Food Insecurity: Not on file  Transportation Needs: Not on file  Physical Activity: Not on file  Stress: Not on file  Social Connections: Not on file     Family History: The patient's ***family history includes Alcohol abuse in her father; Allergies in her sister; Allergy (severe) in her sister; Alzheimer's disease in her mother; Arthritis in her brother; Breast cancer (age of onset: 10) in her sister; Colon cancer in her paternal aunt; Colon polyps in her brother and sister; Congestive Heart Failure in her father; Dementia in her mother; Diabetes in her sister; Heart attack in her maternal grandfather, maternal grandmother, and paternal grandfather; Heart disease in her maternal grandmother; Hypertension in her father; Irritable bowel syndrome in her mother; Other in her sister and sister; Stroke in her mother. There is no history of Esophageal cancer, Rectal cancer, or Stomach cancer.  ROS:   Please see the history of present illness.    *** All other systems reviewed and are negative.  Labs/Other Studies Reviewed:    The following studies were reviewed today: ***  Recent Labs: 05/20/2022: ALT 12; BUN 14; Creatinine, Ser  0.76; Hemoglobin 13.7; Platelets 251.0; Potassium 3.7; Sodium 140; TSH 5.65  Recent Lipid Panel    Component Value Date/Time   CHOL 124 07/23/2013 0632   TRIG 69 07/23/2013 0632   HDL 68 07/23/2013 0632   CHOLHDL 1.8 07/23/2013 0632   VLDL 14 07/23/2013 0632   LDLCALC  42 07/23/2013 [id number]     Risk Assessment/Calculations:   {Does this patient have ATRIAL FIBRILLATION?[phone]}       Physical Exam:    VS:  LMP  (LMP Unknown)     Wt Readings from Last 3 Encounters:  06/16/22 154 lb 6.4 oz (70 kg)  05/20/22 155 lb (70.3 kg)  11/24/21 162 lb (73.5 kg)     GEN: *** Well nourished, well developed in no acute distress HEENT: Normal NECK: No JVD; No carotid bruits CARDIAC: ***RRR, no murmurs, rubs, gallops RESPIRATORY:  Clear to auscultation without rales, wheezing or rhonchi  ABDOMEN: Soft, non-tender, non-distended MUSCULOSKELETAL:  No edema; No deformity. *** pedal pulses, ***bilaterally SKIN: Warm and dry NEUROLOGIC:  Alert and oriented x 3 PSYCHIATRIC:  Normal affect   EKG:  EKG is *** ordered today.  The ekg ordered today demonstrates ***  No BP recorded.  {Refresh Note OR Click here to enter BP  :1}***    Diagnoses:    No diagnosis found. Assessment and Plan:        {Are you ordering a CV Procedure (e.g. stress test, cath, DCCV, TEE, etc)?   Press F2        :[id number]   Disposition:  Medication Adjustments/Labs and Tests Ordered: Current medicines are reviewed at length with the patient today.  Concerns regarding medicines are outlined above.  No orders of the defined types were placed in this encounter.  No orders of the defined types were placed in this encounter.   There are no Patient Instructions on file for this visit.   Signed, Emmaline Life, NP  06/20/2022 1:12 PM    Page

## 2022-06-22 ENCOUNTER — Encounter: Payer: Self-pay | Admitting: Physician Assistant

## 2022-06-22 ENCOUNTER — Ambulatory Visit: Payer: Medicare PPO | Admitting: Nurse Practitioner

## 2022-06-22 ENCOUNTER — Ambulatory Visit: Payer: Medicare PPO | Attending: Nurse Practitioner | Admitting: Physician Assistant

## 2022-06-22 VITALS — BP 132/72 | HR 62 | Ht 67.0 in | Wt 156.0 lb

## 2022-06-22 DIAGNOSIS — I480 Paroxysmal atrial fibrillation: Secondary | ICD-10-CM

## 2022-06-22 DIAGNOSIS — Z01818 Encounter for other preprocedural examination: Secondary | ICD-10-CM

## 2022-06-22 DIAGNOSIS — G4733 Obstructive sleep apnea (adult) (pediatric): Secondary | ICD-10-CM

## 2022-06-22 NOTE — Patient Instructions (Signed)
Medication Instructions:  Your physician recommends that you continue on your current medications as directed. Please refer to the Current Medication list given to you today.  *If you need a refill on your cardiac medications before your next appointment, please call your pharmacy*   Lab Work: None If you have labs (blood work) drawn today and your tests are completely normal, you will receive your results only by: [place] (if you have MyChart) OR A paper copy in the mail If you have any lab test that is abnormal or we need to change your treatment, we will call you to review the results.   Follow-Up: At [facility], you and your health needs are our priority.  As part of our continuing mission to provide you with exceptional heart care, we have created designated Provider Care Teams.  These Care Teams include your primary Cardiologist (physician) and Advanced Practice Providers (APPs -  Physician Assistants and Nurse Practitioners) who all work together to provide you with the care you need, when you need it.   Your next appointment:   6 month(s)  Provider:   Fransico Him, MD   Other Instructions You can take plain mucinex but nor mucinex-d, as we discussed at today's visit.

## 2022-06-22 NOTE — Progress Notes (Signed)
Office Visit    Patient Name: Kimberly [name] Date of Encounter: 06/22/2022  PCP:  Corliss Blacker, MD   [facility]  Cardiologist:  Fransico Him, MD  Advanced Practice Provider:  No care team member to display Electrophysiologist:  None   HPI    Kimberly [name] is a 82 y.o. female with a past medical history significant for PAF on metoprolol and Eliquis for CHA2DS2-VASc score 5 presents today for follow-up appointment and preop clearance.  Seen in the A-fib clinic because of bradycardia.  Has a history of chest pain with normal nuclear stress test and syncope felt secondary to dehydration.  She has a chronic RBBB.  Due to chest pain a coronary CTA was done 11/2018 and showed a calcium score of 0 with no evidence of CAD.  She has moderate OSA with a AHI of 16/h on HST.  She was last seen by Dr. Radford Pax 06/16/2022 and was doing well at that time.  No chest pain or pressure.  No shortness of breath, DOE, PND, orthopnea, dizziness or syncope.  She did have some lower extremity edema at..  She had an episode of PAF the day prior in the setting of a stressful event with her husband.  Compliant with medications.  Doing well with her PAP device and just started back a week ago after being off of it for a while.  Tolerating mask and feels the pressure is adequate.  Today, she tells me that she sometimes has issues with stairs but can do them.  She has no issues with walking 1-2 blocks.  She like to have colonoscopy done because she has had polyps in the past.  She is to do a lot of walking and dancing but not so much anymore.  She does have sciatica holds her back at times.  She has some postnasal drip and voice changes.  We discussed Mucinex.  We reviewed her echocardiogram.  Reports no shortness of breath nor dyspnea on exertion. Reports no chest pain, pressure, or tightness. No edema, orthopnea, PND. Reports no palpitations.   Past Medical History    Past  Medical History:  Diagnosis Date   Allergy    Anal polyp 1998   Flex Sig    Anemia    Angular blepharitis of left eye    Ankle fracture    Stress fracture   Anxiety    Aortic atherosclerosis (HCC)    Asthma    border line has inhaler   Bunion    Cataracts, bilateral    Corneal scar    left eye   CPAP (continuous positive airway pressure) dependence    Degenerative disc disease    Depression    Diverticulosis of colon (without mention of hemorrhage) 2010   Colonoscopy   Dry eyes    bilateral   Enterocele    External hemorrhoids 2000   Colonoscopy   Family history of malignant neoplasm of gastrointestinal tract    Female cystocele    Fibromyalgia    GERD (gastroesophageal reflux disease)    Hiatal hernia 2005,2010   EGD   History of bronchitis    History of measles    History of mumps    History of strep sore throat    History of urinary tract infection    Hyperlipemia    Hypothyroidism    IBS (irritable bowel syndrome)    Imbalance    Internal hemorrhoids without mention of complication 9417,4081   Colonoscopy  Itching    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HCC)    Menopause    Migraine    OSA (obstructive sleep apnea) 05/14/2015   on BiPAP   Osteopenia 10/2013   T score -1.6 FRAX 10%/1.6%   PAF (paroxysmal atrial fibrillation) (HCC)    Pancreatitis    PCO (posterior capsular opacification)    left   Pneumonia    childhood illness   PONV (postoperative nausea and vomiting)    Pseudophakia, both eyes    PVD (posterior vitreous detachment) right   Rash    on back    Retinal scar    left   Rotator cuff disorder    pain, left shoulder   Shingles    Stricture and stenosis of esophagus 2005,2010   EGD    Stroke ([place])    Ulcerative colitis ([place])    Varicose veins    Vertigo    Wears glasses    Past Surgical History:  Procedure Laterality Date   ABDOMINAL HYSTERECTOMY     ANAL FISSURE REPAIR     BREAST EXCISIONAL BIOPSY Left    BUNIONECTOMY  2014   CATARACT  EXTRACTION Bilateral 2013   COLONOSCOPY     polyp removed   ESOPHAGEAL DILATION     EYE SURGERY Left    FOOT SURGERY     left    HEMORRHOID SURGERY     MOUTH SURGERY  11/13/11   Cyst removed from [place]  1970's   REPLACEMENT TOTAL KNEE Right 2011   TONSILLECTOMY     TOTAL ABDOMINAL HYSTERECTOMY W/ BILATERAL SALPINGOOPHORECTOMY  1991   TAH BSO   TOTAL HIP ARTHROPLASTY     right    TOTAL KNEE ARTHROPLASTY     both   TOTAL KNEE ARTHROPLASTY Left 06/23/2015   Procedure: LEFT TOTAL KNEE ARTHROPLASTY;  Surgeon: Gaynelle Arabian, MD;  Location: [facility];  Service: Orthopedics;  Laterality: Left;    Allergies  Allergies  Allergen Reactions   Amoxicillin-Pot Clavulanate Nausea Only, Swelling and Other (See Comments)    Has patient had a PCN reaction causing immediate rash, facial/tongue/throat swelling, SOB or lightheadedness with hypotension: No Has patient had a PCN reaction causing severe rash involving mucus membranes or skin necrosis: No Has patient had a PCN reaction that required hospitalization No Has patient had a PCN reaction occurring within the last 10 years: No If all of the above answers are "NO", then may proceed with Cephalosporin use.  Other reaction(s): nausea   Azithromycin Other (See Comments)    Pancreatitis Other reaction(s): Other, pancreatitis   Erythromycin Hives and Other (See Comments)    Reaction 1 time, when she was younger. Reaction 1 time, when she was younger.  Other reaction(s): hives   Almond Oil Other (See Comments)    Almonds: per allergy testing.   Amoxicillin Nausea Only   Atorvastatin Other (See Comments)    Muscle weakness  Muscle weakness  Other reaction(s): muscle weakness, Other   Codeine Nausea Only and Other (See Comments)    Other reaction(s): Nausea/Vomiting   Ketoconazole Nausea And Vomiting   Latex Other (See Comments)    Other   Morphine Nausea Only    Other reaction(s):  Unknown   Shellfish Allergy Other (See Comments)    Shellfish mix: per allergy testing.   Simvastatin Other (See Comments)    Muscle pain Other reaction(s): muscle pain, Other   Sulfamethoxazole-Trimethoprim Nausea Only  Upset stomach    Clindamycin/Lincomycin Nausea And Vomiting and Rash    EKGs/Labs/Other Studies Reviewed:   The following studies were reviewed today:  Echo 06/16/21  IMPRESSIONS     1. Left ventricular ejection fraction, by estimation, is 60 to 65%. Left  ventricular ejection fraction by 3D volume is 70 %. The left ventricle has  normal function. The left ventricle has no regional wall motion  abnormalities. Left ventricular diastolic   parameters are indeterminate.   2. Right ventricular systolic function is normal. The right ventricular  size is normal. There is normal pulmonary artery systolic pressure.   3. Left atrial size was mildly dilated.   4. The mitral valve is normal in structure. Mild mitral valve  regurgitation. No evidence of mitral stenosis.   5. The aortic valve is normal in structure. Aortic valve regurgitation is  not visualized. No aortic stenosis is present.   6. The inferior vena cava is normal in size with greater than 50%  respiratory variability, suggesting right atrial pressure of 3 mmHg.   FINDINGS   Left Ventricle: Conflicting data re: diastolic function. The "normal"  mitral inflow pattern is unexpected at age 84, especially in view of left  atrial dilation. The pulmonary vein flow is diastolic dominant, which  would also support a "pseudonormal"  pattern. Nevertheless, mitral annulus diastolic velocities are excellent  for age. Consider relative blunting of the A wave (for example, after a  recent episode of paroxysmal atrial fibrillation). Note the absence of  other features that would support an  echo diagnosis of constrictive physiology. Left ventricular ejection  fraction, by estimation, is 60 to 65%. Left ventricular  ejection fraction  by 3D volume is 70 %. The left ventricle has normal function. The left  ventricle has no regional wall motion  abnormalities. The left ventricular internal cavity size was normal in  size. There is no left ventricular hypertrophy. Left ventricular diastolic  parameters are indeterminate. Indeterminate filling pressures.   Right Ventricle: The right ventricular size is normal. No increase in  right ventricular wall thickness. Right ventricular systolic function is  normal. There is normal pulmonary artery systolic pressure. The tricuspid  regurgitant velocity is 2.64 m/s, and   with an assumed right atrial pressure of 3 mmHg, the estimated right  ventricular systolic pressure is 96.2 mmHg.   Left Atrium: Left atrial size was mildly dilated.   Right Atrium: Right atrial size was normal in size.   Pericardium: There is no evidence of pericardial effusion.   Mitral Valve: The mitral valve is normal in structure. Mild mitral valve  regurgitation. No evidence of mitral valve stenosis.   Tricuspid Valve: The tricuspid valve is normal in structure. Tricuspid  valve regurgitation is mild . No evidence of tricuspid stenosis.   Aortic Valve: The aortic valve is normal in structure. Aortic valve  regurgitation is not visualized. Aortic regurgitation PHT measures 621  msec. No aortic stenosis is present. Aortic valve mean gradient measures  4.5 mmHg. Aortic valve peak gradient  measures 8.7 mmHg. Aortic valve area, by VTI measures 2.53 cm.   Pulmonic Valve: The pulmonic valve was normal in structure. Pulmonic valve  regurgitation is not visualized. No evidence of pulmonic stenosis.   Aorta: The aortic root is normal in size and structure.   Venous: The inferior vena cava is normal in size with greater than 50%  respiratory variability, suggesting right atrial pressure of 3 mmHg.   IAS/Shunts: No atrial level shunt detected by  color flow Doppler.       EKG:  EKG is  not ordered today.    Recent Labs: 05/20/2022: ALT 12; BUN 14; Creatinine, Ser 0.76; Hemoglobin 13.7; Platelets 251.0; Potassium 3.7; Sodium 140; TSH 5.65  Recent Lipid Panel    Component Value Date/Time   CHOL 124 07/23/2013 0632   TRIG 69 07/23/2013 0632   HDL 68 07/23/2013 0632   CHOLHDL 1.8 07/23/2013 0632   VLDL 14 07/23/2013 0632   LDLCALC 42 07/23/2013 0632    Home Medications   Current Meds  Medication Sig   acetaminophen (TYLENOL) 500 MG tablet Take 500 mg by mouth every 6 (six) hours as needed for moderate pain.    albuterol (VENTOLIN HFA) 108 (90 Base) MCG/ACT inhaler Inhale 2 puffs into the lungs every 6 (six) hours as needed for wheezing or shortness of breath.   apixaban (ELIQUIS) 5 MG TABS tablet TAKE 1 TABLET BY MOUTH TWICE A DAY   azelastine (ASTELIN) 0.1 % nasal spray Place 2 sprays into both nostrils 2 (two) times daily as needed for rhinitis. Use in each nostril as directed   bisacodyl (DULCOLAX) 5 MG EC tablet Take 5 mg by mouth daily as needed for moderate constipation.   CALTRATE 600+D3 SOFT 600-20 MG-MCG CHEW daily at 6 (six) AM.   Cholecalciferol 100 MCG (4000 UT) TABS Take by mouth.   CLARITIN 10 MG tablet    cycloSPORINE (RESTASIS) 0.05 % ophthalmic emulsion Place 1 drop into both eyes 2 (two) times daily.   diltiazem (CARDIZEM CD) 240 MG 24 hr capsule TAKE 1 CAPSULE BY MOUTH EVERY DAY   EPINEPHrine 0.3 mg/0.3 mL IJ SOAJ injection Inject 0.3 mg into the muscle as needed (for allergic reaction). Reported on 05/28/2015   famotidine (PEPCID) 20 MG tablet Take 20 mg by mouth 2 (two) times daily. OTC medication   fluticasone (FLONASE) 50 MCG/ACT nasal spray PLACE 2 SPRAYS INTO BOTH NOSTRILS 2 (TWO) TIMES DAILY AS NEEDED FOR ALLERGIES.   levothyroxine (SYNTHROID) 100 MCG tablet Take 100 mcg by mouth every morning.   LINZESS 145 MCG CAPS capsule    meclizine (ANTIVERT) 12.5 MG tablet Take 2 tablets by mouth as directed.   metoprolol tartrate (LOPRESSOR) 25 MG  tablet    MIRALAX 17 GM/SCOOP powder SMARTSIG:1 By Mouth   montelukast (SINGULAIR) 10 MG tablet TAKE 1 TABLET BY MOUTH EVERYDAY AT BEDTIME   mupirocin ointment (BACTROBAN) 2 % Apply 1 Application topically 2 (two) times daily.   omeprazole (PRILOSEC) 20 MG capsule    ondansetron (ZOFRAN ODT) 4 MG disintegrating tablet Take 1 tablet (4 mg total) by mouth every 6 (six) hours as needed.   pantoprazole (PROTONIX) 20 MG tablet SMARTSIG:1 By Mouth   Polyethyl Glycol-Propyl Glycol (SYSTANE) 0.4-0.3 % SOLN Apply 2 drops to eye daily as needed.   PROAIR HFA 108 (90 Base) MCG/ACT inhaler Inhale 1-2 puffs into the lungs every 6 (six) hours as needed for wheezing.   Probiotic Product (ALIGN) 4 MG CAPS Take 1 capsule by mouth daily.   psyllium (METAMUCIL) 58.6 % powder Take 1 packet by mouth daily as needed (constipation).   rosuvastatin (CRESTOR) 10 MG tablet Take 5 mg by mouth at bedtime.   SYNTHROID 125 MCG tablet    terconazole (TERAZOL 7) 0.4 % vaginal cream    tobramycin-dexamethasone (TOBRADEX) ophthalmic solution    tretinoin (RETIN-A) 0.025 % cream Apply topically.   triamcinolone cream (KENALOG) 0.1 % Apply 1 Application topically 2 (two) times daily as needed.  Review of Systems      All other systems reviewed and are otherwise negative except as noted above.  Physical Exam    VS:  BP 132/72   Pulse 62   Ht '5\' 7"'$  (1.702 m)   Wt 156 lb (70.8 kg)   LMP  (LMP Unknown)   BMI 24.43 kg/m  , BMI Body mass index is 24.43 kg/m.  Wt Readings from Last 3 Encounters:  06/22/22 156 lb (70.8 kg)  06/16/22 154 lb 6.4 oz (70 kg)  05/20/22 155 lb (70.3 kg)     GEN: Well nourished, well developed, in no acute distress. HEENT: normal. Neck: Supple, no JVD, carotid bruits, or masses. Cardiac: RRR, no murmurs, rubs, or gallops. No clubbing, cyanosis, edema.  Radials/PT 2+ and equal bilaterally.  Respiratory:  Respirations regular and unlabored, clear to auscultation bilaterally. GI: Soft,  nontender, nondistended. MS: No deformity or atrophy. Skin: Warm and dry, no rash. Neuro:  Strength and sensation are intact. Psych: Normal affect.  Assessment & Plan    Preop clearance  Ms. Umphlett's perioperative risk of a major cardiac event is 0.9% according to the Revised Cardiac Risk Index (RCRI).  Therefore, she is at low risk for perioperative complications.   Her functional capacity is good at 5.07 METs according to the Duke Activity Status Index (DASI). Recommendations: According to ACC/AHA guidelines, no further cardiovascular testing needed.  The patient may proceed to surgery at acceptable risk.   Antiplatelet and/or Anticoagulation Recommendations: Eliquis clearance will need to go through our pharmacy (No official clearance request has been sent)  PAF -Maintaining normal sinus rhythm, no palpitations -Continue Eliquis 5 mg twice daily and Cardizem CD2 140 mg daily with as needed refills  OSA -Tolerating CPAP without any issues        Disposition: Follow up 6 months with Fransico Him, MD or APP.  Signed, Elgie Collard, PA-C 06/22/2022, 4:21 PM [facility]

## 2022-06-28 ENCOUNTER — Ambulatory Visit: Payer: Medicare PPO | Admitting: Podiatry

## 2022-06-28 ENCOUNTER — Encounter: Payer: Self-pay | Admitting: Podiatry

## 2022-06-28 VITALS — BP 133/67 | HR 95

## 2022-06-28 DIAGNOSIS — M79674 Pain in right toe(s): Secondary | ICD-10-CM

## 2022-06-28 DIAGNOSIS — Z7901 Long term (current) use of anticoagulants: Secondary | ICD-10-CM

## 2022-06-28 DIAGNOSIS — M79675 Pain in left toe(s): Secondary | ICD-10-CM

## 2022-06-28 DIAGNOSIS — L84 Corns and callosities: Secondary | ICD-10-CM

## 2022-06-28 DIAGNOSIS — B351 Tinea unguium: Secondary | ICD-10-CM | POA: Diagnosis not present

## 2022-06-28 NOTE — Progress Notes (Signed)
This patient returns to my office for at risk foot care.  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  This patient is unable to cut nails herself since the patient cannot reach his nails.These nails are painful walking and wearing shoes.  She also has painful corn between 1/2 right foot.  This patient presents for at risk foot care today.  General Appearance  Alert, conversant and in no acute stress.  Vascular  Dorsalis pedis and posterior tibial  pulses are palpable  bilaterally.  Capillary return is within normal limits  bilaterally. Temperature is within normal limits  bilaterally.  Neurologic  Senn-Weinstein monofilament wire test within normal limits  bilaterally. Muscle power within normal limits bilaterally.  Nails Thick disfigured discolored nails with subungual debris  from hallux to fifth toes bilaterally. No evidence of bacterial infection or drainage bilaterally.  Orthopedic  No limitations of motion  feet .  No crepitus or effusions noted.  HAV  B/L.    Skin  normotropic skin with no porokeratosis noted bilaterally.  No signs of infections or ulcers noted.   Corn 1/2 right hallux.  Onychomycosis  Pain in right toes  Pain in left toes  Consent was obtained for treatment procedures.   Mechanical debridement of nails 1-5  bilaterally performed with a nail nipper.  Filed with dremel without incident. Debride corn with # 15 blade.  Padding dispensed.   Return office visit    3 months                  Told patient to return for periodic foot care and evaluation due to potential at risk complications.   Gardiner Barefoot DPM

## 2022-07-06 ENCOUNTER — Telehealth: Payer: Self-pay | Admitting: Cardiology

## 2022-07-06 NOTE — Telephone Encounter (Signed)
Patient called and would like a call back. Please call at [phone]

## 2022-07-06 NOTE — Telephone Encounter (Signed)
Left message to call the clinic.

## 2022-07-07 ENCOUNTER — Encounter: Payer: Medicare PPO | Admitting: Internal Medicine

## 2022-07-08 ENCOUNTER — Encounter (HOSPITAL_COMMUNITY): Payer: Self-pay | Admitting: *Deleted

## 2022-07-08 NOTE — Telephone Encounter (Signed)
Patient was calling on behave of her husband Seda Kronberg. According to Mrs. Burress, the pt. has gained weight (262 lb) however, the patient did not weight himself today. His symptoms haven't worsen, advised patient to immediately seek medical attention if develop any new or worsening symptoms. Voiced understanding.

## 2022-07-08 NOTE — Telephone Encounter (Signed)
Fatumata Kashani called in regarding her spouse, Kimberly [name] who is a patient of Dr. Radford Pax. Please see telephone note in [facility] chart for today.

## 2022-07-09 ENCOUNTER — Encounter: Payer: Self-pay | Admitting: Internal Medicine

## 2022-07-09 ENCOUNTER — Ambulatory Visit
Admission: RE | Admit: 2022-07-09 | Discharge: 2022-07-09 | Disposition: A | Discharge status: Home / Self Care | Payer: Medicare PPO | Source: Ambulatory Visit | Attending: Internal Medicine | Admitting: Internal Medicine

## 2022-07-09 DIAGNOSIS — M8580 Other specified disorders of bone density and structure, unspecified site: Secondary | ICD-10-CM

## 2022-07-10 ENCOUNTER — Other Ambulatory Visit: Payer: Self-pay | Admitting: Allergy and Immunology

## 2022-07-15 ENCOUNTER — Other Ambulatory Visit: Payer: Self-pay | Admitting: Allergy and Immunology

## 2022-07-21 ENCOUNTER — Ambulatory Visit: Payer: Medicare PPO | Admitting: Cardiology

## 2022-07-27 ENCOUNTER — Ambulatory Visit: Payer: Medicare PPO | Admitting: Internal Medicine

## 2022-07-31 ENCOUNTER — Other Ambulatory Visit: Payer: Self-pay | Admitting: Cardiology

## 2022-07-31 DIAGNOSIS — I480 Paroxysmal atrial fibrillation: Secondary | ICD-10-CM

## 2022-08-02 NOTE — Telephone Encounter (Signed)
Eliquis '5mg'$  refill request received. Patient is 82 years old, weight-70.8kg, Crea-0.76 on 05/20/22, Diagnosis-Afib, and last seen by Nicholes Rough on 06/22/22. Dose is appropriate based on dosing criteria. Will send in refill to requested pharmacy.

## 2022-08-03 ENCOUNTER — Telehealth: Payer: Self-pay | Admitting: Physician Assistant

## 2022-08-03 NOTE — Telephone Encounter (Signed)
Spoke with pt and let her know she is scheduled to see Dr. Hilarie Fredrickson 10/20/22 at 9:10am. Pt aware of appt.

## 2022-08-03 NOTE — Telephone Encounter (Signed)
Inbound call from patient stating that she had an appointment for 2/27 and that when she came in she was told her appointment had been canceled. Patient stated she wanted to make an appointment to discuss  a colonoscopy with Dr. Hilarie Fredrickson. I looks as if patient has been scheduled for an appointment in May with Dr. Hilarie Fredrickson, but patient is requesting a call back to further discuss. Please advise.

## 2022-08-04 ENCOUNTER — Other Ambulatory Visit: Payer: Self-pay | Admitting: Allergy and Immunology

## 2022-08-20 ENCOUNTER — Telehealth: Payer: Self-pay | Admitting: Physician Assistant

## 2022-08-20 NOTE — Telephone Encounter (Signed)
Pt scheduled to see Vicie Mutters PA 09/30/22 at 10am. Pt aware of appt.

## 2022-08-20 NOTE — Telephone Encounter (Signed)
PT is scheduled for an appointment in May but She is having stomach pains and wants to be seen earlier. Please advise.

## 2022-08-30 DIAGNOSIS — R609 Edema, unspecified: Secondary | ICD-10-CM | POA: Diagnosis not present

## 2022-09-01 DIAGNOSIS — M25572 Pain in left ankle and joints of left foot: Secondary | ICD-10-CM | POA: Diagnosis not present

## 2022-09-06 ENCOUNTER — Other Ambulatory Visit (HOSPITAL_BASED_OUTPATIENT_CLINIC_OR_DEPARTMENT_OTHER): Payer: Self-pay | Admitting: Physician Assistant

## 2022-09-06 ENCOUNTER — Ambulatory Visit (HOSPITAL_BASED_OUTPATIENT_CLINIC_OR_DEPARTMENT_OTHER)
Admission: RE | Admit: 2022-09-06 | Discharge: 2022-09-06 | Disposition: A | Discharge status: Home / Self Care | Payer: Medicare PPO | Source: Ambulatory Visit | Attending: Physician Assistant | Admitting: Physician Assistant

## 2022-09-06 DIAGNOSIS — M7989 Other specified soft tissue disorders: Secondary | ICD-10-CM

## 2022-09-09 DIAGNOSIS — I872 Venous insufficiency (chronic) (peripheral): Secondary | ICD-10-CM | POA: Diagnosis not present

## 2022-09-09 DIAGNOSIS — L0390 Cellulitis, unspecified: Secondary | ICD-10-CM | POA: Diagnosis not present

## 2022-09-10 ENCOUNTER — Other Ambulatory Visit: Payer: Self-pay | Admitting: Allergy and Immunology

## 2022-09-10 ENCOUNTER — Other Ambulatory Visit: Payer: Self-pay | Admitting: Cardiology

## 2022-09-10 DIAGNOSIS — Z961 Presence of intraocular lens: Secondary | ICD-10-CM | POA: Diagnosis not present

## 2022-09-10 DIAGNOSIS — H43811 Vitreous degeneration, right eye: Secondary | ICD-10-CM | POA: Diagnosis not present

## 2022-09-13 ENCOUNTER — Telehealth: Payer: Self-pay

## 2022-09-13 NOTE — Telephone Encounter (Signed)
Per Dr. Mayford Knife, patient scheduled w/ DOD for tomorrow 09/14/22 r/t increased leg swelling unimproved with lasix, negative for DVT.

## 2022-09-13 NOTE — Progress Notes (Unsigned)
Cardiology Office Note   Date:  09/14/2022   ID:  Kimberly [name], DOB 1940/09/09, MRN [id number]  PCP:  Kimberly Martyr, MD (Inactive)    No chief complaint on file.  LE edema  Wt Readings from Last 3 Encounters:  09/14/22 155 lb (70.3 kg)  06/22/22 156 lb (70.8 kg)  06/16/22 154 lb 6.4 oz (70 kg)       History of Present Illness: Kimberly [name] is a 82 y.o. female  with a hx of PAF on metoprolol and Eliquis for chads vas score of 5.  Metoprolol decreased by [facility] because of bradycardia.  She also has a history of chest pain with normal nuclear stress test and syncope felt secondary to dehydration.  She has a chronic RBBB. Due to chest pain a coronary CTA was done 11/2018 and showed  a calcium score of 0 and no evidence of CAD.  She has moderate OSA with an AHI of 16/hr on HST.    Reports lower extremity swelling which has not improved with furosemide.  January 2023 echocardiogram showed: "Left ventricular ejection fraction, by estimation, is 60 to 65%. Left  ventricular ejection fraction by 3D volume is 70 %. The left ventricle has  normal function. The left ventricle has no regional wall motion  abnormalities. Left ventricular diastolic   parameters are indeterminate.   2. Right ventricular systolic function is normal. The right ventricular  size is normal. There is normal pulmonary artery systolic pressure.   3. Left atrial size was mildly dilated.   4. The mitral valve is normal in structure. Mild mitral valve  regurgitation. No evidence of mitral stenosis.   5. The aortic valve is normal in structure. Aortic valve regurgitation is  not visualized. No aortic stenosis is present.   6. The inferior vena cava is normal in size with greater than 50%  respiratory variability, suggesting right atrial pressure of 3 mmHg. "  She has had left leg swelling which has not improved with furosemide.  No DVT by recent lower extremity venous duplex.  On  antibiotics for cellulitis.   Per primary care office:Add steroid cream as needed, add compression stockings daily, complete courses of Keflex. Continue Lasix 40 mg in the morning daily until her next visit unless swelling completely resolved, okay to stop if swelling completely resolved"  She has forgoten a few doses of Keflex.  Husband is admitted to the hospital which makes things difficult.   Past Medical History:  Diagnosis Date   Allergy    Anal polyp 1998   Flex Sig    Anemia    Angular blepharitis of left eye    Ankle fracture    Stress fracture   Anxiety    Aortic atherosclerosis (HCC)    Asthma    border line has inhaler   Bunion    Cataracts, bilateral    Corneal scar    left eye   CPAP (continuous positive airway pressure) dependence    Degenerative disc disease    Depression    Diverticulosis of colon (without mention of hemorrhage) 2010   Colonoscopy   Dry eyes    bilateral   Enterocele    External hemorrhoids 2000   Colonoscopy   Family history of malignant neoplasm of gastrointestinal tract    Female cystocele    Fibromyalgia    GERD (gastroesophageal reflux disease)    Hiatal hernia [id number]   EGD   History of bronchitis  History of measles    History of mumps    History of strep sore throat    History of urinary tract infection    Hyperlipemia    Hypothyroidism    IBS (irritable bowel syndrome)    Imbalance    Internal hemorrhoids without mention of complication 1995,2005   Colonoscopy    Itching    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HCC)    Menopause    Migraine    OSA (obstructive sleep apnea) 05/14/2015   on BiPAP   Osteopenia 10/2013   T score -1.6 FRAX 10%/1.6%   PAF (paroxysmal atrial fibrillation) (HCC)    Pancreatitis    PCO (posterior capsular opacification)    left   Pneumonia    childhood illness   PONV (postoperative nausea and vomiting)    Pseudophakia, both eyes    PVD (posterior vitreous detachment) right   Rash    on back     Retinal scar    left   Rotator cuff disorder    pain, left shoulder   Shingles    Stricture and stenosis of esophagus 2005,2010   EGD    Stroke (HCC)    Ulcerative colitis (HCC)    Varicose veins    Vertigo    Wears glasses     Past Surgical History:  Procedure Laterality Date   ABDOMINAL HYSTERECTOMY     ANAL FISSURE REPAIR     BREAST EXCISIONAL BIOPSY Left    BUNIONECTOMY  2014   CATARACT EXTRACTION Bilateral 2013   COLONOSCOPY     polyp removed   ESOPHAGEAL DILATION     EYE SURGERY Left    FOOT SURGERY     left    HEMORRHOID SURGERY     MOUTH SURGERY  11/13/11   Cyst removed from gum-benign    NASAL SEPTUM SURGERY     NASAL SEPTUM SURGERY  1970's   REPLACEMENT TOTAL KNEE Right 2011   TONSILLECTOMY     TOTAL ABDOMINAL HYSTERECTOMY W/ BILATERAL SALPINGOOPHORECTOMY  1991   TAH BSO   TOTAL HIP ARTHROPLASTY     right    TOTAL KNEE ARTHROPLASTY     both   TOTAL KNEE ARTHROPLASTY Left 06/23/2015   Procedure: LEFT TOTAL KNEE ARTHROPLASTY;  Surgeon: Ollen Gross, MD;  Location: [facility];  Service: Orthopedics;  Laterality: Left;     Current Outpatient Medications  Medication Sig Dispense Refill   acetaminophen (TYLENOL) 500 MG tablet Take 500 mg by mouth every 6 (six) hours as needed for moderate pain.      albuterol (VENTOLIN HFA) 108 (90 Base) MCG/ACT inhaler Inhale 2 puffs into the lungs every 6 (six) hours as needed for wheezing or shortness of breath. 18 g 3   azelastine (ASTELIN) 0.1 % nasal spray Place 2 sprays into both nostrils 2 (two) times daily as needed for rhinitis. Use in each nostril as directed 30 mL 5   bisacodyl (DULCOLAX) 5 MG EC tablet Take 5 mg by mouth daily as needed for moderate constipation.     CALTRATE 600+D3 SOFT 600-20 MG-MCG CHEW daily at 6 (six) AM.     Cephalexin 500 MG tablet TAKE 1 TABLET BY MOUTH FOUR TIMES A DAY FOR 7 DAYS     Cholecalciferol 100 MCG (4000 UT) TABS Take by mouth.     CLARITIN 10 MG tablet      cycloSPORINE (RESTASIS)  0.05 % ophthalmic emulsion Place 1 drop into both eyes 2 (two) times daily.     diltiazem (  CARDIZEM CD) 240 MG 24 hr capsule TAKE 1 CAPSULE BY MOUTH EVERY DAY 90 capsule 1   ELIQUIS 5 MG TABS tablet TAKE 1 TABLET BY MOUTH TWICE A DAY 180 tablet 1   EPINEPHrine 0.3 mg/0.3 mL IJ SOAJ injection Inject 0.3 mg into the muscle as needed (for allergic reaction). Reported on 05/28/2015 2 each 2   fluticasone (FLONASE) 50 MCG/ACT nasal spray PLACE 2 SPRAYS INTO BOTH NOSTRILS 2 (TWO) TIMES DAILY AS NEEDED FOR ALLERGIES. 16 g 0   furosemide (LASIX) 20 MG tablet      levothyroxine (SYNTHROID) 100 MCG tablet Take 100 mcg by mouth every morning.     meclizine (ANTIVERT) 12.5 MG tablet Take 2 tablets by mouth as directed.     MIRALAX 17 GM/SCOOP powder SMARTSIG:1 By Mouth     montelukast (SINGULAIR) 10 MG tablet TAKE 1 TABLET BY MOUTH EVERYDAY AT BEDTIME 30 tablet 0   omeprazole (PRILOSEC) 20 MG capsule      ondansetron (ZOFRAN ODT) 4 MG disintegrating tablet Take 1 tablet (4 mg total) by mouth every 6 (six) hours as needed. 20 tablet 0   Polyethyl Glycol-Propyl Glycol (SYSTANE) 0.4-0.3 % SOLN Apply 2 drops to eye daily as needed. 30 mL 5   PROAIR HFA 108 (90 Base) MCG/ACT inhaler Inhale 1-2 puffs into the lungs every 6 (six) hours as needed for wheezing. 18 g 3   Probiotic Product (ALIGN) 4 MG CAPS Take 1 capsule by mouth daily.     psyllium (METAMUCIL) 58.6 % powder Take 1 packet by mouth daily as needed (constipation).     rosuvastatin (CRESTOR) 10 MG tablet Take 5 mg by mouth at bedtime.  4   triamcinolone cream (KENALOG) 0.1 % Apply 1 Application topically 2 (two) times daily as needed.     No current facility-administered medications for this visit.    Allergies:   Amoxicillin-pot clavulanate, Azithromycin, Erythromycin, Almond oil, Amoxicillin, Atorvastatin, Clavulanic acid, Codeine, Ketoconazole, Ketoconazole, Latex, Morphine, Shellfish allergy, Simvastatin, Sulfamethoxazole-trimethoprim, Clindamycin,  Clindamycin/lincomycin, and Erythromycin base    Social History:  The patient  reports that she quit smoking about 47 years ago. Her smoking use included cigarettes. She has a 3.75 pack-year smoking history. She has never used smokeless tobacco. She reports that she does not drink alcohol and does not use drugs.   Family History:  The patient's family history includes Alcohol abuse in her father; Allergies in her sister; Allergy (severe) in her sister; Alzheimer's disease in her mother; Arthritis in her brother; Breast cancer (age of onset: 47) in her sister; Colon cancer in her paternal aunt; Colon polyps in her brother and sister; Congestive Heart Failure in her father; Dementia in her mother; Diabetes in her sister; Heart attack in her maternal grandfather, maternal grandmother, and paternal grandfather; Heart disease in her maternal grandmother; Hypertension in her father; Irritable bowel syndrome in her mother; Other in her sister and sister; Stroke in her mother.    ROS:  Please see the history of present illness.   Otherwise, review of systems are positive for swollen left leg.   All other systems are reviewed and negative.    PHYSICAL EXAM: VS:  BP 130/76   Pulse 73   Ht 5\' 7"  (1.702 m)   Wt 155 lb (70.3 kg)   LMP  (LMP Unknown)   SpO2 96%   BMI 24.28 kg/m  , BMI Body mass index is 24.28 kg/m. GEN: Well nourished, well developed, in no acute distress HEENT: normal Neck: no  JVD, carotid bruits, or masses Cardiac: RRR; no murmurs, rubs, or gallops,left leg edema and erythema  Respiratory:  clear to auscultation bilaterally, normal work of breathing GI: soft, nontender, nondistended, + BS MS: no deformity or atrophy Skin: warm and dry, no rash Neuro:  Strength and sensation are intact Psych: euthymic mood, full affect     Recent Labs: 05/20/2022: ALT 12; BUN 14; Creatinine, Ser 0.76; Hemoglobin 13.7; Platelets 251.0; Potassium 3.7; Sodium 140; TSH 5.65   Lipid Panel     Component Value Date/Time   CHOL 124 07/23/2013 0632   TRIG 69 07/23/2013 0632   HDL 68 07/23/2013 0632   CHOLHDL 1.8 07/23/2013 0632   VLDL 14 07/23/2013 0632   LDLCALC 42 07/23/2013 4098     Other studies Reviewed: Additional studies/ records that were reviewed today with results demonstrating: notes from [facility] reviewed, Cr 0.76  in 12/23.   ASSESSMENT AND PLAN:  Lower extremity edema: Being treated for cellulitis.  We talked about leg elevation, compression and diuretics as the mainstays of treatment for lower extremity edema.  RV function in January 2023 appeared normal by echocardiogram.  No signs of left-sided heart failure.  Since the edema only affects one leg, this is not cardiac edema.  She has forgotten a few of her Keflex tablets as it is 4 times a day medicine.  I stressed the importance of finishing out the entire course of the Keflex. PAF: In NSR today.  On ELiquis.  Lower extremity ultrasound was negative for DVT. OSA: continue current therapy.  Hypothyroid: mildly increased TSH in 12/23. Followed by PMD.    Current medicines are reviewed at length with the patient today.  The patient concerns regarding her medicines were addressed.  The following changes have been made:  No change  Labs/ tests ordered today include:  No orders of the defined types were placed in this encounter.   Recommend 150 minutes/week of aerobic exercise Low fat, low carb, high fiber diet recommended  Disposition:   FU with Dr. Mayford Knife   Signed, Lance Muss, MD  09/14/2022 4:55 PM    [facility] [address] [place], [place], [place]  [address] Phone: [phone]; Fax: [phone]

## 2022-09-14 ENCOUNTER — Ambulatory Visit: Payer: Medicare PPO | Attending: Interventional Cardiology | Admitting: Interventional Cardiology

## 2022-09-14 VITALS — BP 130/76 | HR 73 | Ht 67.0 in | Wt 155.0 lb

## 2022-09-14 DIAGNOSIS — Z7901 Long term (current) use of anticoagulants: Secondary | ICD-10-CM | POA: Diagnosis not present

## 2022-09-14 DIAGNOSIS — G4733 Obstructive sleep apnea (adult) (pediatric): Secondary | ICD-10-CM

## 2022-09-14 DIAGNOSIS — L03116 Cellulitis of left lower limb: Secondary | ICD-10-CM

## 2022-09-14 DIAGNOSIS — I480 Paroxysmal atrial fibrillation: Secondary | ICD-10-CM

## 2022-09-14 NOTE — Patient Instructions (Signed)
Medication Instructions:  Your physician recommends that you continue on your current medications as directed. Please refer to the Current Medication list given to you today.  *If you need a refill on your cardiac medications before your next appointment, please call your pharmacy*   Lab Work: none If you have labs (blood work) drawn today and your tests are completely normal, you will receive your results only by: MyChart Message (if you have MyChart) OR A paper copy in the mail If you have any lab test that is abnormal or we need to change your treatment, we will call you to review the results.   Testing/Procedures: none   Follow-Up: At [facility], you and your health needs are our priority.  As part of our continuing mission to provide you with exceptional heart care, we have created designated Provider Care Teams.  These Care Teams include your primary Cardiologist (physician) and Advanced Practice Providers (APPs -  Physician Assistants and Nurse Practitioners) who all work together to provide you with the care you need, when you need it.  We recommend signing up for the patient portal called "MyChart".  Sign up information is provided on this After Visit Summary.  MyChart is used to connect with patients for Virtual Visits (Telemedicine).  Patients are able to view lab/test results, encounter notes, upcoming appointments, etc.  Non-urgent messages can be sent to your provider as well.   To learn more about what you can do with MyChart, go to [email or web address].    Your next appointment:   July 15  Provider:   Armanda Magic, MD     Other Instructions Elevate legs as much as possible

## 2022-09-21 DIAGNOSIS — I4891 Unspecified atrial fibrillation: Secondary | ICD-10-CM | POA: Diagnosis not present

## 2022-09-21 DIAGNOSIS — M7989 Other specified soft tissue disorders: Secondary | ICD-10-CM | POA: Diagnosis not present

## 2022-09-21 DIAGNOSIS — I872 Venous insufficiency (chronic) (peripheral): Secondary | ICD-10-CM | POA: Diagnosis not present

## 2022-09-21 DIAGNOSIS — I8310 Varicose veins of unspecified lower extremity with inflammation: Secondary | ICD-10-CM | POA: Diagnosis not present

## 2022-09-29 NOTE — Progress Notes (Unsigned)
09/30/2022 Kimberly [name] [id number] 06-11-1940  Referring provider: Joya Martyr, MD Primary GI doctor: Dr. Rhea Belton  ASSESSMENT AND PLAN:   LLQ pain with constipation history, worsening with stress, decrease in weight, and new unilateral left leg swelling, negative DVT, goes into her foot, concern for lymphedema.  Most likely this still represents IBS with constipation however with new concern for possible lymphedema unilaterally and left foot in the past month and a half and weight loss with abdominal pain will schedule for CT abdomen pelvis to make sure there is no mass effect.  Irritable bowel syndrome with constipation possible component of pelvis floor dysfunction with history and symptoms of bladder prolapse as well as IBS-C Increase MiraLAX to twice daily, continue fiber and squatty.. Given samples of Linzess for her to try if MiraLAX is not helping. Pending CT can consider pelvic floor physical therapy.  History of colonic polyps 2020, with 6 TA's, patient is due. We had a long discussion about the purpose of colon cancer screening, the benefits and their risks. With shared decision making we have decided to proceed with CT abdomen pelvis first CT abdomen and pelvis first, and with currently mourning loss of her husband Kimberly [name] who passed 2 weeks ago she is not interested in scheduling a colonoscopy at this time. Will follow-up in 3 months, can consider colonoscopy at that time if CT is unremarkable and patient continues to have symptoms.  PAF (paroxysmal atrial fibrillation) (HCC) No chest pain, no SOB, no symptoms at this time.  Patient is on Elquis  Gastroesophageal reflux disease without esophagitis Lifestyle changes discussed, avoid NSAIDS, ETOH Continue on the same medication, reports symptoms are well controlled.   Patient Care Team: Joya Martyr, MD as PCP - General (Internal Medicine) Quintella Reichert, MD as PCP - Cardiology  (Cardiology)  HISTORY OF PRESENT ILLNESS: 82 y.o. female with a past medical history of PAF on Eliquis, CTA 11/2018 no evidence of CAD, moderate OSA, hypothyroidism, bladder prolapse personal history of colonic polyps, GERD with small hiatal hernia, IBS C and others listed below presents for evaluation of AB pain and constipation.   01/25/2019 colonoscopy 8 polyps ranging from 2 to 5 mm in size. There was also external hemorrhoids and internal hemorrhoids found. These polyps were found to be tubular adenomas x6, benign lymphoid aggregate x2 recall August 2023.  Has been abdominal discomfort afterwards, went to ER, thought to be trapped air unremarkable evaluation.   05/11/2020 CT abdomen pelvis for abdominal pain completely unremarkable 04/2021 office visit with Dr. Rhea Belton for follow-up.   Struggle with incomplete evacuation and constipation.   Given trial of Linzess. 05/12/2022 called with left lower quadrant abdominal pain/intense pressure no fever, but did have nocturnal pain, if continuing pain potential CT. 05/20/2022 patient seen in the office by myself, discussed potentially getting CT scan versus colonoscopy.  Was was he scheduled for colonoscopy but wanted to talk with Dr. Mayford Knife first prior to scheduling.  Worse with stress.  Husband Kimberly [name] with cancer thyroid and prostate, passed on 09/30/2022 at [facility]. She is having lower AB pain, worse with stress, not associated with food. BM small volume every day.  No hematochezia, no melena.  Denies nausea/vomiting. Denies fever, chills.  She has had decreased appetite, states she has been busy with projects and just not wanting to eat.  She did not give the linzess a try, she has been on miralax once a day.  She has venous insuff with venous stasis  that developed acutely a month and a half ago, following with Dr. Donette Larry.  She is on lasix and potasium. Negative [place] for DVT. Has swelling on left foot more than right foot, can have swelling into  her toes and foot.  She states her bladder is almost completely prolapsed.  5 lbs weight loss since Jan. Wt Readings from Last 5 Encounters:  09/30/22 151 lb (68.5 kg)  09/14/22 155 lb (70.3 kg)  06/22/22 156 lb (70.8 kg)  06/16/22 154 lb 6.4 oz (70 kg)  05/20/22 155 lb (70.3 kg)     She  reports that she quit smoking about 47 years ago. Her smoking use included cigarettes. She has a 3.75 pack-year smoking history. She has never used smokeless tobacco. She reports that she does not drink alcohol and does not use drugs.  Current Medications:   Current Outpatient Medications (Endocrine & Metabolic):    levothyroxine (SYNTHROID) 100 MCG tablet, Take 100 mcg by mouth every morning.  Current Outpatient Medications (Cardiovascular):    diltiazem (CARDIZEM CD) 240 MG 24 hr capsule, TAKE 1 CAPSULE BY MOUTH EVERY DAY   EPINEPHrine 0.3 mg/0.3 mL IJ SOAJ injection, Inject 0.3 mg into the muscle as needed (for allergic reaction). Reported on 05/28/2015   furosemide (LASIX) 20 MG tablet,    rosuvastatin (CRESTOR) 10 MG tablet, Take 5 mg by mouth at bedtime.  Current Outpatient Medications (Respiratory):    albuterol (VENTOLIN HFA) 108 (90 Base) MCG/ACT inhaler, Inhale 2 puffs into the lungs every 6 (six) hours as needed for wheezing or shortness of breath.   azelastine (ASTELIN) 0.1 % nasal spray, Place 2 sprays into both nostrils 2 (two) times daily as needed for rhinitis. Use in each nostril as directed   CLARITIN 10 MG tablet,    fluticasone (FLONASE) 50 MCG/ACT nasal spray, PLACE 2 SPRAYS INTO BOTH NOSTRILS 2 (TWO) TIMES DAILY AS NEEDED FOR ALLERGIES.   montelukast (SINGULAIR) 10 MG tablet, TAKE 1 TABLET BY MOUTH EVERYDAY AT BEDTIME   PROAIR HFA 108 (90 Base) MCG/ACT inhaler, Inhale 1-2 puffs into the lungs every 6 (six) hours as needed for wheezing.  Current Outpatient Medications (Analgesics):    acetaminophen (TYLENOL) 500 MG tablet, Take 500 mg by mouth every 6 (six) hours as needed for  moderate pain.   Current Outpatient Medications (Hematological):    ELIQUIS 5 MG TABS tablet, TAKE 1 TABLET BY MOUTH TWICE A DAY  Current Outpatient Medications (Other):    bisacodyl (DULCOLAX) 5 MG EC tablet, Take 5 mg by mouth daily as needed for moderate constipation.   CALTRATE 600+D3 SOFT 600-20 MG-MCG CHEW, daily at 6 (six) AM.   Cephalexin 500 MG tablet, TAKE 1 TABLET BY MOUTH FOUR TIMES A DAY FOR 7 DAYS   Cholecalciferol 100 MCG (4000 UT) TABS, Take by mouth.   cycloSPORINE (RESTASIS) 0.05 % ophthalmic emulsion, Place 1 drop into both eyes 2 (two) times daily.   KLOR-CON M10 10 MEQ tablet, Take 10 mEq by mouth once.   meclizine (ANTIVERT) 12.5 MG tablet, Take 2 tablets by mouth as directed.   MIRALAX 17 GM/SCOOP powder, SMARTSIG:1 By Mouth   omeprazole (PRILOSEC) 20 MG capsule,    ondansetron (ZOFRAN ODT) 4 MG disintegrating tablet, Take 1 tablet (4 mg total) by mouth every 6 (six) hours as needed.   Polyethyl Glycol-Propyl Glycol (SYSTANE) 0.4-0.3 % SOLN, Apply 2 drops to eye daily as needed.   Probiotic Product (ALIGN) 4 MG CAPS, Take 1 capsule by mouth daily.  psyllium (METAMUCIL) 58.6 % powder, Take 1 packet by mouth daily as needed (constipation).   triamcinolone cream (KENALOG) 0.1 %, Apply 1 Application topically 2 (two) times daily as needed.  Medical History:  Past Medical History:  Diagnosis Date   Allergy    Anal polyp 1998   Flex Sig    Anemia    Angular blepharitis of left eye    Ankle fracture    Stress fracture   Anxiety    Aortic atherosclerosis (HCC)    Asthma    border line has inhaler   Bunion    Cataracts, bilateral    Corneal scar    left eye   CPAP (continuous positive airway pressure) dependence    Degenerative disc disease    Depression    Diverticulosis of colon (without mention of hemorrhage) 2010   Colonoscopy   Dry eyes    bilateral   Enterocele    External hemorrhoids 2000   Colonoscopy   Family history of malignant neoplasm of  gastrointestinal tract    Female cystocele    Fibromyalgia    GERD (gastroesophageal reflux disease)    Hiatal hernia 2005,2010   EGD   History of bronchitis    History of measles    History of mumps    History of strep sore throat    History of urinary tract infection    Hyperlipemia    Hypothyroidism    IBS (irritable bowel syndrome)    Imbalance    Internal hemorrhoids without mention of complication 1995,2005   Colonoscopy    Itching    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HCC)    Menopause    Migraine    OSA (obstructive sleep apnea) 05/14/2015   on BiPAP   Osteopenia 10/2013   T score -1.6 FRAX 10%/1.6%   PAF (paroxysmal atrial fibrillation) (HCC)    Pancreatitis    PCO (posterior capsular opacification)    left   Pneumonia    childhood illness   PONV (postoperative nausea and vomiting)    Pseudophakia, both eyes    PVD (posterior vitreous detachment) right   Rash    on back    Retinal scar    left   Rotator cuff disorder    pain, left shoulder   Shingles    Stricture and stenosis of esophagus 2005,2010   EGD    Stroke (HCC)    Ulcerative colitis (HCC)    Varicose veins    Vertigo    Wears glasses    Allergies:  Allergies  Allergen Reactions   Amoxicillin-Pot Clavulanate Nausea Only, Other (See Comments) and Swelling    Has patient had a PCN reaction causing immediate rash, facial/tongue/throat swelling, SOB or lightheadedness with hypotension: No  Has patient had a PCN reaction causing severe rash involving mucus membranes or skin necrosis: No  Has patient had a PCN reaction that required hospitalization No  Has patient had a PCN reaction occurring within the last 10 years: No  If all of the above answers are "NO", then may proceed with Cephalosporin use.  Other reaction(s): nausea  Other Reaction(s): GI Intolerance   Azithromycin Other (See Comments)    Pancreatitis  Other reaction(s): Other, pancreatitis  Other Reaction(s): Other (See  Comments)  pancreatitis   Erythromycin Hives and Other (See Comments)    Reaction 1 time, when she was younger.  Other reaction(s): hives   Almond Oil Other (See Comments)    Almonds: per allergy testing.   Amoxicillin Nausea Only  Atorvastatin Other (See Comments)    Muscle weakness  Muscle weakness  Other reaction(s): muscle weakness, Other   Clavulanic Acid Nausea Only    Other Reaction(s): GI Intolerance   Codeine Nausea Only and Other (See Comments)    Other reaction(s): Nausea/Vomiting  Other Reaction(s): GI Intolerance   Ketoconazole Nausea And Vomiting   Ketoconazole Nausea And Vomiting, Nausea Only and Other (See Comments)    Other reaction(s): Unknown  Other Reaction(s): GI Intolerance   Latex Other (See Comments)    Other  Other Reaction(s): Other (See Comments)  Other,   Morphine Nausea Only    Other reaction(s): Unknown  Other Reaction(s): GI Intolerance   Shellfish Allergy Other (See Comments)    Shellfish mix: per allergy testing.   Simvastatin Other (See Comments)    Muscle pain Other reaction(s): muscle pain, Other   Sulfamethoxazole-Trimethoprim Nausea Only    Upset stomach  Other Reaction(s): GI Intolerance   Clindamycin Rash   Clindamycin/Lincomycin Nausea And Vomiting and Rash   Erythromycin Base Rash     Surgical History:  She  has a past surgical history that includes Total abdominal hysterectomy w/ bilateral salpingoophorectomy (1991); Tonsillectomy; Replacement total knee (Right, 2011); Eye surgery (Left); Total knee arthroplasty; Cataract extraction (Bilateral, 2013); Nasal septum surgery; Mouth surgery (11/13/11); Colonoscopy; Nasal septum surgery (1970's); Esophageal dilation; Bunionectomy (2014); Anal fissure repair; Foot surgery; Hemorrhoid surgery; Abdominal hysterectomy; Total hip arthroplasty; Total knee arthroplasty (Left, 06/23/2015); and Breast excisional biopsy (Left). Family History:  Her family history includes Alcohol abuse  in her father; Allergies in her sister; Allergy (severe) in her sister; Alzheimer's disease in her mother; Arthritis in her brother; Breast cancer (age of onset: 26) in her sister; Colon cancer in her paternal aunt; Colon polyps in her brother and sister; Congestive Heart Failure in her father; Dementia in her mother; Diabetes in her sister; Heart attack in her maternal grandfather, maternal grandmother, and paternal grandfather; Heart disease in her maternal grandmother; Hypertension in her father; Irritable bowel syndrome in her mother; Other in her sister and sister; Stroke in her mother.  REVIEW OF SYSTEMS  : All other systems reviewed and negative except where noted in the History of Present Illness.  PHYSICAL EXAM: BP 98/62   Pulse 67   Ht 5' 6.5" (1.689 m)   Wt 151 lb (68.5 kg)   LMP  (LMP Unknown)   SpO2 97%   BMI 24.01 kg/m  General:   Pleasant, well developed female in no acute distress Head:   Normocephalic and atraumatic. Eyes:  sclerae anicteric,conjunctive pink  Heart:   regular rate and rhythm Pulm:  Clear anteriorly; no wheezing Abdomen:   Soft, Obese AB, Active bowel sounds. mild tenderness in the LLQ. Without guarding and Without rebound, No organomegaly appreciated. Rectal: Not evaluated Extremities: With 2+ edema and mild erythema left lower leg into her forefoot, no warmth no calf pain, negative Homans.  Right leg unremarkable. Msk: Symmetrical without gross deformities. Peripheral pulses intact.  Neurologic:  Alert and  oriented x4;  No focal deficits.  Skin:   Dry and intact without significant lesions or rashes. Psychiatric:  Cooperative. Normal mood and affect.  RELEVANT LABS AND IMAGING: CBC    Component Value Date/Time   WBC 6.0 05/20/2022 1023   RBC 4.29 05/20/2022 1023   HGB 13.7 05/20/2022 1023   HGB 12.8 04/30/2021 1100   HCT 40.6 05/20/2022 1023   HCT 38.3 04/30/2021 1100   PLT 251.0 05/20/2022 1023   PLT 228 04/30/2021  1100   MCV 94.6 05/20/2022  1023   MCV 95 04/30/2021 1100   MCH 31.7 04/30/2021 1100   MCH 31.3 11/05/2020 0225   MCHC 33.8 05/20/2022 1023   RDW 12.9 05/20/2022 1023   RDW 11.9 04/30/2021 1100   LYMPHSABS 1.6 05/20/2022 1023   MONOABS 0.5 05/20/2022 1023   EOSABS 0.0 05/20/2022 1023   BASOSABS 0.0 05/20/2022 1023    CMP     Component Value Date/Time   NA 140 05/20/2022 1023   NA 140 06/16/2021 0723   K 3.7 05/20/2022 1023   CL 103 05/20/2022 1023   CO2 27 05/20/2022 1023   GLUCOSE 91 05/20/2022 1023   BUN 14 05/20/2022 1023   BUN 16 06/16/2021 0723   CREATININE 0.76 05/20/2022 1023   CALCIUM 9.7 05/20/2022 1023   PROT 7.2 05/20/2022 1023   ALBUMIN 4.2 05/20/2022 1023   AST 19 05/20/2022 1023   ALT 12 05/20/2022 1023   ALKPHOS 40 05/20/2022 1023   BILITOT 0.4 05/20/2022 1023   GFRNONAA >60 11/05/2020 0225   GFRAA >60 01/28/2019 0050     Doree Albee, PA-C 11:34 AM

## 2022-09-30 ENCOUNTER — Encounter: Payer: Self-pay | Admitting: Physician Assistant

## 2022-09-30 ENCOUNTER — Ambulatory Visit: Payer: Medicare PPO | Admitting: Physician Assistant

## 2022-09-30 VITALS — BP 98/62 | HR 67 | Ht 66.5 in | Wt 151.0 lb

## 2022-09-30 DIAGNOSIS — R1032 Left lower quadrant pain: Secondary | ICD-10-CM

## 2022-09-30 DIAGNOSIS — Z86010 Personal history of colonic polyps: Secondary | ICD-10-CM | POA: Diagnosis not present

## 2022-09-30 DIAGNOSIS — R2242 Localized swelling, mass and lump, left lower limb: Secondary | ICD-10-CM

## 2022-09-30 DIAGNOSIS — I480 Paroxysmal atrial fibrillation: Secondary | ICD-10-CM | POA: Diagnosis not present

## 2022-09-30 DIAGNOSIS — R1084 Generalized abdominal pain: Secondary | ICD-10-CM

## 2022-09-30 DIAGNOSIS — K581 Irritable bowel syndrome with constipation: Secondary | ICD-10-CM | POA: Diagnosis not present

## 2022-09-30 DIAGNOSIS — K219 Gastro-esophageal reflux disease without esophagitis: Secondary | ICD-10-CM | POA: Diagnosis not present

## 2022-09-30 NOTE — Patient Instructions (Addendum)
You have been scheduled for a CT scan of the abdomen and pelvis at [facility]. You are scheduled on 10/02/2022 at 4:00pm. You should arrive 15 minutes prior to your appointment time for registration. ARRIVE AT 3:45PM Please follow the written instructions below on the day of your exam:   1) Do not eat anything after 4 hours prior to your test)    Location: [address] [place], [place] [address]    You may take any medications as prescribed with a small amount of water, if necessary. If you take any of the following medications: METFORMIN, GLUCOPHAGE, GLUCOVANCE, AVANDAMET, RIOMET, FORTAMET, ACTOPLUS MET, JANUMET, GLUMETZA or METAGLIP, you MAY be asked to HOLD this medication 48 hours AFTER the exam.   The purpose of you drinking the oral contrast is to aid in the visualization of your intestinal tract. The contrast solution may cause some diarrhea. Depending on your individual set of symptoms, you may also receive an intravenous injection of x-ray contrast/dye. Plan on being at [facility] for 45 minutes or longer, depending on the type of exam you are having performed.   If you have any questions regarding your exam or if you need to reschedule, you may call [facility] at [phone] between the hours of 8:00 am and 5:00 pm, Monday-Friday.     Stocking donner, check out on [place] or supply store.  Elastic therapy in [place].    Miralax is an osmotic laxative.  It only brings more water into the stool.  This is safe to take daily.  Can take up to 17 gram of miralax twice a day.  Mix with juice or coffee.  Start 1 capful twice a day for 3-4 days and reassess your response in 3-4 days.  You can increase and decrease the dose based on your response.  Remember, it can take up to 3-4 days to take effect OR for the effects to wear off.   I often pair this with benefiber in the morning to help assure the stool is not too loose.   If the miralax does not help try  the linzess. Linzess  *IBS-C patients may begin to experience relief from belly pain and overall abdominal symptoms (pain, discomfort, and bloating) in about 1 week,  with symptoms typically improving over 12 weeks.  Take at least 30 minutes before the first meal of the day on an empty stomach You can have a loose stool if you eat a high-fat breakfast. Give it at least 7 days, may have more bowel movements during that time.   The diarrhea should go away and you should start having normal, complete, full bowel movements.  It may be helpful to start treatment when you can be near the comfort of your own bathroom, such as a weekend.  After you are out we can send in a prescription if you did well, there is a prescription card   Recommend starting on a fiber supplement, can try metamucil first but if this causes gas/bloating switch to benefiber or citracel, these do not cause gas.  Take with fiber with with a full 8 oz glass of water once a day. This can take 1 month to start helping, so try for at least one month.  Recommend increasing water and physical activity.   - Drink at least 64-80 ounces of water/liquid per day. - Establish a time to try to move your bowels every day.  For many people, this is after a cup  of coffee or after a meal such as breakfast. - Sit all of the way back on the toilet keeping your back fairly straight and while sitting up, try to rest the tops of your forearms on your upper thighs.   - Raising your feet with a step stool/squatty potty can be helpful to improve the angle that allows your stool to pass through the rectum. - Relax the rectum feeling it bulge toward the toilet water.  If you feel your rectum raising toward your body, you are contracting rather than relaxing. - Breathe in and slowly exhale. "Belly breath" by expanding your belly towards your belly button. Keep belly expanded as you gently direct pressure down and back to the anus.  A low pitched GRRR sound  can assist with increasing intra-abdominal pressure.  - Repeat 3-4 times. If unsuccessful, contract the pelvic floor to restore normal tone and get off the toilet.  Avoid excessive straining. - To reduce excessive wiping by teaching your anus to normally contract, place hands on outer aspect of knees and resist knee movement outward.  Hold 5-10 second then place hands just inside of knees and resist inward movement of knees.  Hold 5 seconds.  Repeat a few times each way.  Go to the ER if unable to pass gas, severe AB pain, unable to hold down food, any shortness of breath of chest pain.  First do a trial off milk/lactose products if you use them.  Add fiber like benefiber or citracel once a day Increase activity Can do trial of IBGard which is over the counter for AB pain- Take 1-2 capsules once a day for maintence or twice a day during a flare  Please try low FODMAP diet- see below- start with eliminating just one column at a time, the table at the very bottom contains foods that are safe to take   FODMAP stands for fermentable oligo-, di-, mono-saccharides and polyols (1). These are the scientific terms used to classify groups of carbs that are notorious for triggering digestive symptoms like bloating, gas and stomach pain.     _______________________________________________________  If your blood pressure at your visit was 140/90 or greater, please contact your primary care physician to follow up on this.  _______________________________________________________  If you are age 36 or older, your body mass index should be between 23-30. Your Body mass index is 24.01 kg/m. If this is out of the aforementioned range listed, please consider follow up with your Primary Care Provider.  If you are age 45 or younger, your body mass index should be between 19-25. Your Body mass index is 24.01 kg/m. If this is out of the aformentioned range listed, please consider follow up with your Primary Care  Provider.   ________________________________________________________  The [place] [facility] providers would like to encourage you to use [facility] to communicate with providers for non-urgent requests or questions.  Due to long hold times on the telephone, sending your provider a message by [facility] may be a faster and more efficient way to get a response.  Please allow 48 business hours for a response.  Please remember that this is for non-urgent requests.  _______________________________________________________ It was a pleasure to see you today!  Thank you for trusting me with your gastrointestinal care!

## 2022-10-02 ENCOUNTER — Ambulatory Visit (HOSPITAL_BASED_OUTPATIENT_CLINIC_OR_DEPARTMENT_OTHER)
Admission: RE | Admit: 2022-10-02 | Discharge: 2022-10-02 | Disposition: A | Discharge status: Home / Self Care | Payer: Medicare PPO | Source: Ambulatory Visit | Attending: Physician Assistant | Admitting: Physician Assistant

## 2022-10-02 DIAGNOSIS — R1032 Left lower quadrant pain: Secondary | ICD-10-CM | POA: Diagnosis not present

## 2022-10-02 DIAGNOSIS — R1084 Generalized abdominal pain: Secondary | ICD-10-CM | POA: Insufficient documentation

## 2022-10-02 MED ORDER — IOHEXOL 300 MG/ML  SOLN
80.0000 mL | Freq: Once | INTRAMUSCULAR | Status: AC | PRN
  Administered 2022-10-02: 80 mL via INTRAVENOUS

## 2022-10-08 NOTE — Progress Notes (Signed)
Addendum: Reviewed and agree with assessment and management plan. Lauren Modisette M, MD  

## 2022-10-11 ENCOUNTER — Other Ambulatory Visit: Payer: Self-pay | Admitting: *Deleted

## 2022-10-11 DIAGNOSIS — M7989 Other specified soft tissue disorders: Secondary | ICD-10-CM

## 2022-10-12 DIAGNOSIS — E039 Hypothyroidism, unspecified: Secondary | ICD-10-CM | POA: Diagnosis not present

## 2022-10-12 DIAGNOSIS — M810 Age-related osteoporosis without current pathological fracture: Secondary | ICD-10-CM | POA: Diagnosis not present

## 2022-10-16 DIAGNOSIS — R0981 Nasal congestion: Secondary | ICD-10-CM | POA: Diagnosis not present

## 2022-10-16 DIAGNOSIS — Z03818 Encounter for observation for suspected exposure to other biological agents ruled out: Secondary | ICD-10-CM | POA: Diagnosis not present

## 2022-10-16 DIAGNOSIS — R5383 Other fatigue: Secondary | ICD-10-CM | POA: Diagnosis not present

## 2022-10-16 DIAGNOSIS — R6883 Chills (without fever): Secondary | ICD-10-CM | POA: Diagnosis not present

## 2022-10-16 DIAGNOSIS — J329 Chronic sinusitis, unspecified: Secondary | ICD-10-CM | POA: Diagnosis not present

## 2022-10-16 DIAGNOSIS — J302 Other seasonal allergic rhinitis: Secondary | ICD-10-CM | POA: Diagnosis not present

## 2022-10-18 NOTE — Progress Notes (Unsigned)
Patient name: Kimberly [name] MRN: [id number] DOB: May 14, 1941 Sex: female  REASON FOR CONSULT: Left lower extremity swelling with probable chronic venous insufficiency   HPI: Kimberly [name] is a 82 y.o. female, with hx atrial fibrillation that presents for evaluation of left lower extremity swelling and probable chronic venous insufficiency.  Patient states that she has been dealing with more swelling in the left leg over the last several months.  She has spent a lot of time at [facility] caring for her husband who was chronically ill and recently passed away.  States he is not elevating her legs.  She has had bilateral great saphenous vein ablations years ago.  No history of DVT.  Past Medical History:  Diagnosis Date   Allergy    Anal polyp 1998   Flex Sig    Anemia    Angular blepharitis of left eye    Ankle fracture    Stress fracture   Anxiety    Aortic atherosclerosis (HCC)    Asthma    border line has inhaler   Bunion    Cataracts, bilateral    Corneal scar    left eye   CPAP (continuous positive airway pressure) dependence    Degenerative disc disease    Depression    Diverticulosis of colon (without mention of hemorrhage) 2010   Colonoscopy   Dry eyes    bilateral   Enterocele    External hemorrhoids 2000   Colonoscopy   Family history of malignant neoplasm of gastrointestinal tract    Female cystocele    Fibromyalgia    GERD (gastroesophageal reflux disease)    Hiatal hernia 2005,2010   EGD   History of bronchitis    History of measles    History of mumps    History of strep sore throat    History of urinary tract infection    Hyperlipemia    Hypothyroidism    IBS (irritable bowel syndrome)    Imbalance    Internal hemorrhoids without mention of complication 1995,2005   Colonoscopy    Itching    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HCC)    Menopause    Migraine    OSA (obstructive sleep apnea) 05/14/2015   on BiPAP   Osteopenia 10/2013   T score -1.6  FRAX 10%/1.6%   PAF (paroxysmal atrial fibrillation) (HCC)    Pancreatitis    PCO (posterior capsular opacification)    left   Pneumonia    childhood illness   PONV (postoperative nausea and vomiting)    Pseudophakia, both eyes    PVD (posterior vitreous detachment) right   Rash    on back    Retinal scar    left   Rotator cuff disorder    pain, left shoulder   Shingles    Stricture and stenosis of esophagus 2005,2010   EGD    Stroke (HCC)    Ulcerative colitis (HCC)    Varicose veins    Vertigo    Wears glasses     Past Surgical History:  Procedure Laterality Date   ABDOMINAL HYSTERECTOMY     ANAL FISSURE REPAIR     BREAST EXCISIONAL BIOPSY Left    BUNIONECTOMY  2014   CATARACT EXTRACTION Bilateral 2013   COLONOSCOPY     polyp removed   ESOPHAGEAL DILATION     EYE SURGERY Left    FOOT SURGERY     left    HEMORRHOID SURGERY     MOUTH SURGERY  11/13/11  Cyst removed from gum-benign    NASAL SEPTUM SURGERY     NASAL SEPTUM SURGERY  1970's   REPLACEMENT TOTAL KNEE Right 2011   TONSILLECTOMY     TOTAL ABDOMINAL HYSTERECTOMY W/ BILATERAL SALPINGOOPHORECTOMY  1991   TAH BSO   TOTAL HIP ARTHROPLASTY     right    TOTAL KNEE ARTHROPLASTY     both   TOTAL KNEE ARTHROPLASTY Left 06/23/2015   Procedure: LEFT TOTAL KNEE ARTHROPLASTY;  Surgeon: Ollen Gross, MD;  Location: [facility];  Service: Orthopedics;  Laterality: Left;    Family History  Problem Relation Age of Onset   Dementia Mother    Irritable bowel syndrome Mother    Alzheimer's disease Mother    Stroke Mother    Hypertension Father    Congestive Heart Failure Father    Alcohol abuse Father    Diabetes Sister    Allergy (severe) Sister    Colon cancer Paternal Aunt    Heart disease Maternal Grandmother    Heart attack Maternal Grandmother    Heart attack Paternal Grandfather    Heart attack Maternal Grandfather    Arthritis Brother    Colon polyps Brother    Other Sister        pituitary disease    Allergies Sister    Other Sister        joint problems   Breast cancer Sister 33   Colon polyps Sister    Esophageal cancer Neg Hx    Rectal cancer Neg Hx    Stomach cancer Neg Hx     SOCIAL HISTORY: Social History   Socioeconomic History   Marital status: Married    Spouse name: Not on file   Number of children: 1   Years of education: bus.school   Highest education level: Not on file  Occupational History   Occupation: Retired    Associate Professor: RETIRED  Tobacco Use   Smoking status: Former    Packs/day: 0.25    Years: 15.00    Additional pack years: 0.00    Total pack years: 3.75    Types: Cigarettes    Quit date: 06/01/1975    Years since quitting: 47.4   Smokeless tobacco: Never   Tobacco comments:    Stopped smoking age 19  Vaping Use   Vaping Use: Never used  Substance and Sexual Activity   Alcohol use: No    Alcohol/week: 0.0 standard drinks of alcohol   Drug use: No   Sexual activity: Never    Birth control/protection: Surgical    Comment: HYST, first intercourse >16, sexual partner less than 5  Other Topics Concern   Not on file  Social History Narrative   Daily caffeine    Social Determinants of Health   Financial Resource Strain: Not on file  Food Insecurity: Not on file  Transportation Needs: Not on file  Physical Activity: Not on file  Stress: Not on file  Social Connections: Not on file  Intimate Partner Violence: Not on file    Allergies  Allergen Reactions   Amoxicillin-Pot Clavulanate Nausea Only, Other (See Comments) and Swelling    Has patient had a PCN reaction causing immediate rash, facial/tongue/throat swelling, SOB or lightheadedness with hypotension: No  Has patient had a PCN reaction causing severe rash involving mucus membranes or skin necrosis: No  Has patient had a PCN reaction that required hospitalization No  Has patient had a PCN reaction occurring within the last 10 years: No  If all of  the above answers are "NO", then  may proceed with Cephalosporin use.  Other reaction(s): nausea  Other Reaction(s): GI Intolerance   Azithromycin Other (See Comments)    Pancreatitis  Other reaction(s): Other, pancreatitis  Other Reaction(s): Other (See Comments)  pancreatitis   Erythromycin Hives and Other (See Comments)    Reaction 1 time, when she was younger.  Other reaction(s): hives   Almond Oil Other (See Comments)    Almonds: per allergy testing.   Amoxicillin Nausea Only   Atorvastatin Other (See Comments)    Muscle weakness  Muscle weakness  Other reaction(s): muscle weakness, Other   Clavulanic Acid Nausea Only    Other Reaction(s): GI Intolerance   Codeine Nausea Only and Other (See Comments)    Other reaction(s): Nausea/Vomiting  Other Reaction(s): GI Intolerance   Ketoconazole Nausea And Vomiting   Ketoconazole Nausea And Vomiting, Nausea Only and Other (See Comments)    Other reaction(s): Unknown  Other Reaction(s): GI Intolerance   Latex Other (See Comments)    Other  Other Reaction(s): Other (See Comments)  Other,   Morphine Nausea Only    Other reaction(s): Unknown  Other Reaction(s): GI Intolerance   Shellfish Allergy Other (See Comments)    Shellfish mix: per allergy testing.   Simvastatin Other (See Comments)    Muscle pain Other reaction(s): muscle pain, Other   Sulfamethoxazole-Trimethoprim Nausea Only    Upset stomach  Other Reaction(s): GI Intolerance   Clindamycin Rash   Clindamycin/Lincomycin Nausea And Vomiting and Rash   Erythromycin Base Rash    Current Outpatient Medications  Medication Sig Dispense Refill   acetaminophen (TYLENOL) 500 MG tablet Take 500 mg by mouth every 6 (six) hours as needed for moderate pain.      albuterol (VENTOLIN HFA) 108 (90 Base) MCG/ACT inhaler Inhale 2 puffs into the lungs every 6 (six) hours as needed for wheezing or shortness of breath. 18 g 3   azelastine (ASTELIN) 0.1 % nasal spray Place 2 sprays into both nostrils 2  (two) times daily as needed for rhinitis. Use in each nostril as directed 30 mL 5   bisacodyl (DULCOLAX) 5 MG EC tablet Take 5 mg by mouth daily as needed for moderate constipation.     CALTRATE 600+D3 SOFT 600-20 MG-MCG CHEW daily at 6 (six) AM.     Cephalexin 500 MG tablet TAKE 1 TABLET BY MOUTH FOUR TIMES A DAY FOR 7 DAYS     Cholecalciferol 100 MCG (4000 UT) TABS Take by mouth.     CLARITIN 10 MG tablet      cycloSPORINE (RESTASIS) 0.05 % ophthalmic emulsion Place 1 drop into both eyes 2 (two) times daily.     diltiazem (CARDIZEM CD) 240 MG 24 hr capsule TAKE 1 CAPSULE BY MOUTH EVERY DAY 90 capsule 1   ELIQUIS 5 MG TABS tablet TAKE 1 TABLET BY MOUTH TWICE A DAY 180 tablet 1   EPINEPHrine 0.3 mg/0.3 mL IJ SOAJ injection Inject 0.3 mg into the muscle as needed (for allergic reaction). Reported on 05/28/2015 2 each 2   fluticasone (FLONASE) 50 MCG/ACT nasal spray PLACE 2 SPRAYS INTO BOTH NOSTRILS 2 (TWO) TIMES DAILY AS NEEDED FOR ALLERGIES. 16 g 0   furosemide (LASIX) 20 MG tablet      KLOR-CON M10 10 MEQ tablet Take 10 mEq by mouth once.     levothyroxine (SYNTHROID) 100 MCG tablet Take 100 mcg by mouth every morning.     meclizine (ANTIVERT) 12.5 MG tablet Take 2 tablets  by mouth as directed.     MIRALAX 17 GM/SCOOP powder SMARTSIG:1 By Mouth     montelukast (SINGULAIR) 10 MG tablet TAKE 1 TABLET BY MOUTH EVERYDAY AT BEDTIME 30 tablet 0   omeprazole (PRILOSEC) 20 MG capsule      ondansetron (ZOFRAN ODT) 4 MG disintegrating tablet Take 1 tablet (4 mg total) by mouth every 6 (six) hours as needed. 20 tablet 0   Polyethyl Glycol-Propyl Glycol (SYSTANE) 0.4-0.3 % SOLN Apply 2 drops to eye daily as needed. 30 mL 5   PROAIR HFA 108 (90 Base) MCG/ACT inhaler Inhale 1-2 puffs into the lungs every 6 (six) hours as needed for wheezing. 18 g 3   Probiotic Product (ALIGN) 4 MG CAPS Take 1 capsule by mouth daily.     psyllium (METAMUCIL) 58.6 % powder Take 1 packet by mouth daily as needed  (constipation).     rosuvastatin (CRESTOR) 10 MG tablet Take 5 mg by mouth at bedtime.  4   triamcinolone cream (KENALOG) 0.1 % Apply 1 Application topically 2 (two) times daily as needed.     No current facility-administered medications for this visit.    REVIEW OF SYSTEMS:  [X]  denotes positive finding, [ ]  denotes negative finding Cardiac  Comments:  Chest pain or chest pressure:    Shortness of breath upon exertion:    Short of breath when lying flat:    Irregular heart rhythm:        Vascular    Pain in calf, thigh, or hip brought on by ambulation:    Pain in feet at night that wakes you up from your sleep:     Blood clot in your veins:    Leg swelling:  x       Pulmonary    Oxygen at home:    Productive cough:     Wheezing:         Neurologic    Sudden weakness in arms or legs:     Sudden numbness in arms or legs:     Sudden onset of difficulty speaking or slurred speech:    Temporary loss of vision in one eye:     Problems with dizziness:         Gastrointestinal    Blood in stool:     Vomited blood:         Genitourinary    Burning when urinating:     Blood in urine:        Psychiatric    Major depression:         Hematologic    Bleeding problems:    Problems with blood clotting too easily:        Skin    Rashes or ulcers:        Constitutional    Fever or chills:      PHYSICAL EXAM: There were no vitals filed for this visit.  GENERAL: The patient is a well-nourished female, in no acute distress. The vital signs are documented above. CARDIAC: There is a regular rate and rhythm.  VASCULAR:  Bilateral femoral pulses palpable Bilateral DP pulses and PT pulses palpable No lower extremity venous ulcerations Erythema to the left leg consistent with chronic venous insufficiency  PULMONARY: No respiratory distress. ABDOMEN: Soft and non-tender. MUSCULOSKELETAL: There are no major deformities or cyanosis. NEUROLOGIC: No focal weakness or paresthesias  are detected. PSYCHIATRIC: The patient has a normal affect.  DATA:   Lower Venous Reflux Study   Patient Name:  Kimberly [name]  Charlyne Mom  Date of Exam:   10/19/2022  Medical Rec #: [id number]            Accession #:    [id number]  Date of Birth: 15-Jun-1940           Patient Gender: F  Patient Age:   33 years  Exam Location:  [facility]  Procedure:      VAS [place] LOWER EXTREMITY VENOUS REFLUX  Referring Phys: Sherald Hess    ---------------------------------------------------------------------------  -----    Indications: Varicosities.    Comparison Study: No prior study   Performing Technologist: Gertie Fey MHA, RDMS, RVT, RDCS     Examination Guidelines: A complete evaluation includes B-mode imaging,  spectral  Doppler, color Doppler, and power Doppler as needed of all accessible  portions  of each vessel. Bilateral testing is considered an integral part of a  complete  examination. Limited examinations for reoccurring indications may be  performed  as noted. The reflux portion of the exam is performed with the patient in  reverse Trendelenburg.  Significant venous reflux is defined as >500 ms in the superficial venous  system, and >1 second in the deep venous system.     +--------------+---------+------+-----------+------------+-------------+  LEFT         Reflux NoRefluxReflux TimeDiameter cmsComments                               Yes                                        +--------------+---------+------+-----------+------------+-------------+  CFV                    yes   >1 second                            +--------------+---------+------+-----------+------------+-------------+  FV mid        no                                                   +--------------+---------+------+-----------+------------+-------------+  Popliteal    no                                                    +--------------+---------+------+-----------+------------+-------------+  GSV at SFJ              yes    >500 ms      0.68                   +--------------+---------+------+-----------+------------+-------------+  GSV prox thigh                                      out of fascia  +--------------+---------+------+-----------+------------+-------------+  SSV prox calf no                            0.33                   +--------------+---------+------+-----------+------------+-------------+  Summary:  Left:  - No evidence of deep vein thrombosis seen in the left lower extremity,  from the common femoral through the popliteal veins.  - No evidence of superficial venous thrombosis in the left lower  extremity.  - The deep venous system is incompetent at the common femoral vein.  - The great saphenous vein is incompetent at the [facility].  - The small saphenous vein is competent.   Assessment/Plan:  82 year old female with evidence of chronic venous insufficiency in the left lower extremity consistent with CEAP classification C3 given edema.  I discussed that her left leg reflux study shows no evidence of DVT.  Her left leg great saphenous vein has been previously ablated years ago.  Most of her residual reflux is in the deep venous system.  I discussed her best option would be conservative therapy.  I discussed elevation with exercise and compression stockings.  We did get her sized for thigh-high medical grade compression stockings.  I discussed she can wear these daily.  She can follow-up with me as needed.  Discussed the erythema in her left leg is likely related to her chronic venous disease and not necessarily cellulitis that needs antibiotics.   Cephus Shelling, MD [facility] Office: [phone]

## 2022-10-19 ENCOUNTER — Encounter: Payer: Self-pay | Admitting: Vascular Surgery

## 2022-10-19 ENCOUNTER — Ambulatory Visit (HOSPITAL_COMMUNITY)
Admission: RE | Admit: 2022-10-19 | Discharge: 2022-10-19 | Disposition: A | Discharge status: Home / Self Care | Payer: Medicare PPO | Source: Ambulatory Visit | Attending: Vascular Surgery | Admitting: Vascular Surgery

## 2022-10-19 ENCOUNTER — Ambulatory Visit: Payer: Medicare PPO | Admitting: Vascular Surgery

## 2022-10-19 VITALS — BP 103/68 | HR 71 | Temp 97.2°F | Resp 14 | Ht 67.0 in | Wt 149.0 lb

## 2022-10-19 DIAGNOSIS — I872 Venous insufficiency (chronic) (peripheral): Secondary | ICD-10-CM

## 2022-10-19 DIAGNOSIS — M7989 Other specified soft tissue disorders: Secondary | ICD-10-CM

## 2022-10-20 ENCOUNTER — Ambulatory Visit: Payer: Medicare PPO | Admitting: Internal Medicine

## 2022-10-20 ENCOUNTER — Encounter: Payer: Self-pay | Admitting: Internal Medicine

## 2022-10-20 ENCOUNTER — Telehealth: Payer: Self-pay | Admitting: *Deleted

## 2022-10-20 VITALS — BP 100/60 | HR 64 | Ht 66.0 in | Wt 150.0 lb

## 2022-10-20 DIAGNOSIS — Z86010 Personal history of colonic polyps: Secondary | ICD-10-CM | POA: Diagnosis not present

## 2022-10-20 DIAGNOSIS — Z83719 Family history of colon polyps, unspecified: Secondary | ICD-10-CM

## 2022-10-20 DIAGNOSIS — R1030 Lower abdominal pain, unspecified: Secondary | ICD-10-CM | POA: Diagnosis not present

## 2022-10-20 DIAGNOSIS — K5909 Other constipation: Secondary | ICD-10-CM

## 2022-10-20 MED ORDER — NA SULFATE-K SULFATE-MG SULF 17.5-3.13-1.6 GM/177ML PO SOLN: ORAL | 0 refills | Status: DC

## 2022-10-20 NOTE — Telephone Encounter (Signed)
Patient with diagnosis of PAF on Eliquis for anticoagulation.    Procedure: colonoscopy  Date of procedure: 12/24/2022   CHA2DS2-VASc Score = 6   This indicates a 9.7% annual risk of stroke. The patient's score is based upon: CHF History: 0 HTN History: 0 Diabetes History: 0 Stroke History: 2 Vascular Disease History: 1 Age Score: 2 Gender Score: 1     CrCl 54 mL/min (Adj BW SrCr 0.76) (05/20/2022) Platelet count 251 K (05/20/2022)    Per office protocol, patient can hold Eliquis for 2 days prior to procedure.     **This guidance is not considered finalized until pre-operative APP has relayed final recommendations.**

## 2022-10-20 NOTE — Telephone Encounter (Signed)
   Patient Name: Kimberly [name]  DOB: 12-21-1940 MRN: [id number]  Primary Cardiologist: Armanda Magic, MD  Clinical pharmacists have reviewed the patient's past medical history, labs, and current medications as part of preoperative protocol coverage. The following recommendations have been made:  Patient with diagnosis of PAF on Eliquis for anticoagulation.     Procedure: colonoscopy  Date of procedure: 12/24/2022     CHA2DS2-VASc Score = 6   This indicates a 9.7% annual risk of stroke. The patient's score is based upon: CHF History: 0 HTN History: 0 Diabetes History: 0 Stroke History: 2 Vascular Disease History: 1 Age Score: 2 Gender Score: 1       CrCl 54 mL/min (Adj BW SrCr 0.76) (05/20/2022) Platelet count 251 K (05/20/2022)      Per office protocol, patient can hold Eliquis for 2 days prior to procedure.  Please resume Eliquis as soon as possible postprocedure, at the discretion of the surgeon.   I will route this recommendation to the requesting party via Epic fax function and remove from pre-op pool.  Please call with questions.  Joylene Grapes, NP 10/20/2022, 12:41 PM

## 2022-10-20 NOTE — Progress Notes (Signed)
Subjective:    Patient ID: Kimberly [name], female    DOB: 11-30-1940, 82 y.o.   MRN: [id number]  HPI Kimberly [name] is an 82 year old female known to me with a history of adenomatous colon polyps, chronic constipation, family history of colonic polyps in 3 siblings (her brother has had very large polyps requiring multiple EMR's), atrial fibrillation on Eliquis, and GERD.  She is here alone today.  She reports that she has been doing fairly well.  She does still struggle with some constipation and this can be associated with mid lower abdominal cramping.  She does much better when she is adherent with MiraLAX.  She admits to not having been totally using this on a daily basis.  She is also been snacking more on foods like nuts and popcorn.  This seems to make her cramping abdominal pain a little more severe.  No blood in stool or melena.  Unfortunately her husband died on 2022/10/11 after a long battle with cancer.  She does continue Eliquis under the direction of her cardiologist Dr. Mayford Knife. She is not having any chest pain or shortness of breath. Lower extremity edema is being treated with Lasix plus potassium supplementation; she recently saw vascular surgery for venous insufficiency.  Her last colonoscopy was in August 2020 where 6 adenomas were removed.   Review of Systems As per HPI, otherwise negative  Current Medications, Allergies, Past Medical History, Past Surgical History, Family History and Social History were reviewed in [employer] record.    Objective:   Physical Exam BP 100/60 (BP Location: Left Arm, Patient Position: Sitting, Cuff Size: Normal)   Pulse 64   Ht 5\' 6"  (1.676 m)   Wt 150 lb (68 kg)   LMP  (LMP Unknown)   BMI 24.21 kg/m  Gen: awake, alert, NAD HEENT: anicteric  Abd: soft, NT/ND, +BS throughout Ext: no c/c, 1+ edema LE Neuro: nonfocal     Latest Ref Rng & Units 05/20/2022   10:23 AM 04/30/2021   11:00 AM 11/05/2020     2:25 AM  CBC  WBC 4.0 - 10.5 K/uL 6.0  6.1  6.6   Hemoglobin 12.0 - 15.0 g/dL 40.3  47.4  25.9   Hematocrit 36.0 - 46.0 % 40.6  38.3  40.3   Platelets 150.0 - 400.0 K/uL 251.0  228  217    CMP     Component Value Date/Time   NA 140 05/20/2022 1023   NA 140 06/16/2021 0723   K 3.7 05/20/2022 1023   CL 103 05/20/2022 1023   CO2 27 05/20/2022 1023   GLUCOSE 91 05/20/2022 1023   BUN 14 05/20/2022 1023   BUN 16 06/16/2021 0723   CREATININE 0.76 05/20/2022 1023   CALCIUM 9.7 05/20/2022 1023   PROT 7.2 05/20/2022 1023   ALBUMIN 4.2 05/20/2022 1023   AST 19 05/20/2022 1023   ALT 12 05/20/2022 1023   ALKPHOS 40 05/20/2022 1023   BILITOT 0.4 05/20/2022 1023   GFRNONAA >60 11/05/2020 0225   GFRAA >60 01/28/2019 0050         Assessment & Plan:   82 year old female known to me with a history of adenomatous colon polyps, chronic constipation, family history of colonic polyps in 3 siblings (her brother has had very large polyps requiring multiple EMR's), atrial fibrillation on Eliquis, and GERD.   Personal history of colon polyps/family history of colonic polyps and multiple first-degree relatives --we discussed how screening and surveillance colonoscopy become slightly higher  risk after age 69.  Performing colonoscopy after age 85 is an individualized decision based on ones overall risk versus benefit profile.  We discussed this at length today.  I do think that she is healthy enough to proceed with an additional surveillance colonoscopy.  Her last 1 was nearly 4 years ago where 6 adenomas were removed.  We did discuss the slightly high risk of cardiovascular or pulmonary complications with anesthesia as well as perforation.  Overall the risk of both of these complications remains low. --After thorough discussion she wishes to proceed with surveillance colonoscopy; we will schedule this after she meets with cardiology in mid July  2.  Chronic anticoagulation -- Will hold Eliquis 2 days  prior to endoscopic procedures - will instruct when and how to resume after procedure. Benefits and risks of procedure explained including risks of bleeding, perforation, infection, missed lesions, reactions to medications and possible need for hospitalization and surgery for complications. Additional rare but real risk of stroke or other vascular clotting events off Eliquis also explained and need to seek urgent help if any signs of these problems occur. Will communicate by phone or EMR with patient's  prescribing provider to confirm that holding Eliquis is reasonable in this case.   3.  Chronic constipation --she needs to resume MiraLAX which has been very effective for her.  I would expect constipation and abdominal cramping to resolve as this condition improved -- MiraLAX 17 g once to twice daily

## 2022-10-20 NOTE — Patient Instructions (Signed)
You have been scheduled for a colonoscopy. Please follow written instructions given to you at your visit today.  Please pick up your prep supplies at the pharmacy within the next 1-3 days. If you use inhalers (even only as needed), please bring them with you on the day of your procedure.  Please purchase the following medications over the counter and take as directed: Miralax 17 grams (1 capful) dissolved in about 8 ounces water/juice twice daily.  _______________________________________________________  If your blood pressure at your visit was 140/90 or greater, please contact your primary care physician to follow up on this.  _______________________________________________________  If you are age 82 or older, your body mass index should be between 23-30. Your Body mass index is 24.21 kg/m. If this is out of the aforementioned range listed, please consider follow up with your Primary Care Provider.  If you are age 82 or younger, your body mass index should be between 19-25. Your Body mass index is 24.21 kg/m. If this is out of the aformentioned range listed, please consider follow up with your Primary Care Provider.   ________________________________________________________  The [place] [facility] providers would like to encourage you to use [facility] to communicate with providers for non-urgent requests or questions.  Due to long hold times on the telephone, sending your provider a message by [facility] may be a faster and more efficient way to get a response.  Please allow 48 business hours for a response.  Please remember that this is for non-urgent requests.  _______________________________________________________  Due to recent changes in healthcare laws, you may see the results of your imaging and laboratory studies on MyChart before your provider has had a chance to review them.  We understand that in some cases there may be results that are confusing or concerning to you. Not all laboratory  results come back in the same time frame and the provider may be waiting for multiple results in order to interpret others.  Please give [place] 48 hours in order for your provider to thoroughly review all the results before contacting the office for clarification of your results.

## 2022-10-20 NOTE — Telephone Encounter (Signed)
Request for surgical clearance:     Endoscopy Procedure  What type of surgery is being performed?     colonoscopy  When is this surgery scheduled?     12/24/22  What type of clearance is required ?   Pharmacy  Are there any medications that need to be held prior to surgery and how long? Eliquis, 2 days  Practice name and name of physician performing surgery?      [facility]  What is your office phone and fax number?      Phone- [phone]  Fax- [phone]  Anesthesia type (None, local, MAC, general) ?       MAC

## 2022-10-21 NOTE — Telephone Encounter (Signed)
I have spoken to patient to advise that per cardiology, she may hold Eliquis 2 days prior to her upcoming procedure. She verbalizes understanding.

## 2022-11-01 DIAGNOSIS — R0982 Postnasal drip: Secondary | ICD-10-CM | POA: Diagnosis not present

## 2022-11-01 DIAGNOSIS — I700 Atherosclerosis of aorta: Secondary | ICD-10-CM | POA: Diagnosis not present

## 2022-11-01 DIAGNOSIS — D6869 Other thrombophilia: Secondary | ICD-10-CM | POA: Diagnosis not present

## 2022-11-01 DIAGNOSIS — Z86010 Personal history of colonic polyps: Secondary | ICD-10-CM | POA: Diagnosis not present

## 2022-11-01 DIAGNOSIS — I872 Venous insufficiency (chronic) (peripheral): Secondary | ICD-10-CM | POA: Diagnosis not present

## 2022-11-10 DIAGNOSIS — Z0000 Encounter for general adult medical examination without abnormal findings: Secondary | ICD-10-CM | POA: Diagnosis not present

## 2022-11-10 DIAGNOSIS — D6869 Other thrombophilia: Secondary | ICD-10-CM | POA: Diagnosis not present

## 2022-11-10 DIAGNOSIS — E039 Hypothyroidism, unspecified: Secondary | ICD-10-CM | POA: Diagnosis not present

## 2022-11-10 DIAGNOSIS — F411 Generalized anxiety disorder: Secondary | ICD-10-CM | POA: Diagnosis not present

## 2022-11-10 DIAGNOSIS — G4733 Obstructive sleep apnea (adult) (pediatric): Secondary | ICD-10-CM | POA: Diagnosis not present

## 2022-11-10 DIAGNOSIS — I872 Venous insufficiency (chronic) (peripheral): Secondary | ICD-10-CM | POA: Diagnosis not present

## 2022-11-10 DIAGNOSIS — I4891 Unspecified atrial fibrillation: Secondary | ICD-10-CM | POA: Diagnosis not present

## 2022-11-10 DIAGNOSIS — I480 Paroxysmal atrial fibrillation: Secondary | ICD-10-CM | POA: Diagnosis not present

## 2022-11-10 DIAGNOSIS — E785 Hyperlipidemia, unspecified: Secondary | ICD-10-CM | POA: Diagnosis not present

## 2022-11-10 DIAGNOSIS — I700 Atherosclerosis of aorta: Secondary | ICD-10-CM | POA: Diagnosis not present

## 2022-11-10 DIAGNOSIS — M8580 Other specified disorders of bone density and structure, unspecified site: Secondary | ICD-10-CM | POA: Diagnosis not present

## 2022-11-10 DIAGNOSIS — R7309 Other abnormal glucose: Secondary | ICD-10-CM | POA: Diagnosis not present

## 2022-11-22 DIAGNOSIS — I872 Venous insufficiency (chronic) (peripheral): Secondary | ICD-10-CM | POA: Diagnosis not present

## 2022-11-22 DIAGNOSIS — J309 Allergic rhinitis, unspecified: Secondary | ICD-10-CM | POA: Diagnosis not present

## 2022-12-01 ENCOUNTER — Telehealth: Payer: Self-pay

## 2022-12-01 NOTE — Telephone Encounter (Signed)
Pt advised and voiced understanding. Msg sent to appt desk.

## 2022-12-01 NOTE — Telephone Encounter (Signed)
Yes, needs OV to evaluate prolapse and agree with UC if she is experiencing UTI symptoms. Thanks.

## 2022-12-01 NOTE — Telephone Encounter (Signed)
BS pt calling to report having to get up multiple times in the night to use restroom and has concerns that prolapse could possibly be getting worse. Is taking furosemide but not daily (~q 2-3 days). Denies any other urinary sxs for now. Pt reported in her msg that she left that she was experiencing chills so I confirmed that with her but she denies having a fever and states she feels like the chills could be related to her allergy issues.  OK to schedule appt and advise her of UC if sxs of UTI develop prior to appt?

## 2022-12-08 NOTE — Telephone Encounter (Signed)
Pt scheduled on 12/10/2022 w/ JC. Will route to provider for final review and close.

## 2022-12-10 ENCOUNTER — Ambulatory Visit: Payer: Medicare PPO | Admitting: Radiology

## 2022-12-10 VITALS — BP 120/76

## 2022-12-10 DIAGNOSIS — R3129 Other microscopic hematuria: Secondary | ICD-10-CM | POA: Diagnosis not present

## 2022-12-10 DIAGNOSIS — R350 Frequency of micturition: Secondary | ICD-10-CM | POA: Diagnosis not present

## 2022-12-10 LAB — URINALYSIS, COMPLETE W/RFL CULTURE
Bilirubin Urine: NEGATIVE
Glucose, UA: NEGATIVE
Hyaline Cast: NONE SEEN /LPF
Leukocyte Esterase: NEGATIVE
Nitrites, Initial: NEGATIVE
WBC, UA: NONE SEEN /HPF (ref 0–5)

## 2022-12-10 NOTE — Progress Notes (Signed)
SUBJECTIVE: Kimberly [name] is a 82 y.o. female ? UTI, dysuria x's 1 day--none today, frequency, urgency. Symptoms intermittent x's a few weeks. Denies fever, chills or back pain. No vaginal symptoms. Colonoscopy is scheduled 12/24/22   Allergies  Allergen Reactions   Amoxicillin-Pot Clavulanate Nausea Only, Other (See Comments) and Swelling    Has patient had a PCN reaction causing immediate rash, facial/tongue/throat swelling, SOB or lightheadedness with hypotension: No  Has patient had a PCN reaction causing severe rash involving mucus membranes or skin necrosis: No  Has patient had a PCN reaction that required hospitalization No  Has patient had a PCN reaction occurring within the last 10 years: No  If all of the above answers are "NO", then may proceed with Cephalosporin use.  Other reaction(s): nausea  Other Reaction(s): GI Intolerance   Azithromycin Other (See Comments)    Pancreatitis  Other reaction(s): Other, pancreatitis  Other Reaction(s): Other (See Comments)  pancreatitis   Erythromycin Hives and Other (See Comments)    Reaction 1 time, when she was younger.  Other reaction(s): hives   Almond Oil Other (See Comments)    Almonds: per allergy testing.   Amoxicillin Nausea Only   Atorvastatin Other (See Comments)    Muscle weakness  Muscle weakness  Other reaction(s): muscle weakness, Other   Clavulanic Acid Nausea Only    Other Reaction(s): GI Intolerance   Codeine Nausea Only and Other (See Comments)    Other reaction(s): Nausea/Vomiting  Other Reaction(s): GI Intolerance   Ketoconazole Nausea And Vomiting   Ketoconazole Nausea And Vomiting, Nausea Only and Other (See Comments)    Other reaction(s): Unknown  Other Reaction(s): GI Intolerance   Latex Other (See Comments)    Other  Other Reaction(s): Other (See Comments)  Other,   Morphine Nausea Only    Other reaction(s): Unknown  Other Reaction(s): GI Intolerance   Shellfish  Allergy Other (See Comments)    Shellfish mix: per allergy testing.   Simvastatin Other (See Comments)    Muscle pain Other reaction(s): muscle pain, Other   Sulfamethoxazole-Trimethoprim Nausea Only    Upset stomach  Other Reaction(s): GI Intolerance   Clindamycin Rash   Clindamycin/Lincomycin Nausea And Vomiting and Rash   Erythromycin Base Rash    Current Outpatient Medications on File Prior to Visit  Medication Sig Dispense Refill   acetaminophen (TYLENOL) 500 MG tablet Take 500 mg by mouth every 6 (six) hours as needed for moderate pain.      albuterol (VENTOLIN HFA) 108 (90 Base) MCG/ACT inhaler Inhale 2 puffs into the lungs every 6 (six) hours as needed for wheezing or shortness of breath. 18 g 3   azelastine (ASTELIN) 0.1 % nasal spray Place 2 sprays into both nostrils 2 (two) times daily as needed for rhinitis. Use in each nostril as directed 30 mL 5   bisacodyl (DULCOLAX) 5 MG EC tablet Take 5 mg by mouth daily as needed for moderate constipation.     CALTRATE 600+D3 SOFT 600-20 MG-MCG CHEW daily at 6 (six) AM.     Cephalexin 500 MG tablet TAKE 1 TABLET BY MOUTH FOUR TIMES A DAY FOR 7 DAYS (Patient not taking: Reported on 10/19/2022)     Cholecalciferol 100 MCG (4000 UT) TABS Take by mouth.     CLARITIN 10 MG tablet      cycloSPORINE (RESTASIS) 0.05 % ophthalmic emulsion Place 1 drop into both eyes 2 (two) times daily.     diltiazem (CARDIZEM CD) 240  MG 24 hr capsule TAKE 1 CAPSULE BY MOUTH EVERY DAY 90 capsule 1   doxycycline (VIBRA-TABS) 100 MG tablet Take 100 mg by mouth 2 (two) times daily. (Patient not taking: Reported on 10/20/2022)     ELIQUIS 5 MG TABS tablet TAKE 1 TABLET BY MOUTH TWICE A DAY 180 tablet 1   EPINEPHrine 0.3 mg/0.3 mL IJ SOAJ injection Inject 0.3 mg into the muscle as needed (for allergic reaction). Reported on 05/28/2015 (Patient not taking: Reported on 10/20/2022) 2 each 2   fluticasone (FLONASE) 50 MCG/ACT nasal spray PLACE 2 SPRAYS INTO BOTH NOSTRILS 2  (TWO) TIMES DAILY AS NEEDED FOR ALLERGIES. 16 g 0   furosemide (LASIX) 40 MG tablet Take 20 mg by mouth 2 (two) times daily.     ipratropium (ATROVENT) 0.06 % nasal spray Place 2 sprays into both nostrils 3 (three) times daily.     KLOR-CON M10 10 MEQ tablet Take 10 mEq by mouth 2 (two) times daily.     levothyroxine (SYNTHROID) 100 MCG tablet Take 100 mcg by mouth every morning.     meclizine (ANTIVERT) 12.5 MG tablet Take 2 tablets by mouth as directed.     MIRALAX 17 GM/SCOOP powder Take 17 g by mouth as needed.     montelukast (SINGULAIR) 10 MG tablet TAKE 1 TABLET BY MOUTH EVERYDAY AT BEDTIME 30 tablet 0   Na Sulfate-K Sulfate-Mg Sulf 17.5-3.13-1.6 GM/177ML SOLN Use as directed; may use generic; goodrx card if insurance will not cover generic 354 mL 0   omeprazole (PRILOSEC) 20 MG capsule      ondansetron (ZOFRAN ODT) 4 MG disintegrating tablet Take 1 tablet (4 mg total) by mouth every 6 (six) hours as needed. 20 tablet 0   Polyethyl Glycol-Propyl Glycol (SYSTANE) 0.4-0.3 % SOLN Apply 2 drops to eye daily as needed. 30 mL 5   PROAIR HFA 108 (90 Base) MCG/ACT inhaler Inhale 1-2 puffs into the lungs every 6 (six) hours as needed for wheezing. 18 g 3   Probiotic Product (ALIGN) 4 MG CAPS Take 1 capsule by mouth daily.     psyllium (METAMUCIL) 58.6 % powder Take 1 packet by mouth daily as needed (constipation).     rosuvastatin (CRESTOR) 10 MG tablet Take 5 mg by mouth at bedtime.  4   triamcinolone cream (KENALOG) 0.1 % Apply 1 Application topically 2 (two) times daily as needed.     No current facility-administered medications on file prior to visit.    Past Medical History:  Diagnosis Date   Allergy    Anal polyp 1998   Flex Sig    Anemia    Angular blepharitis of left eye    Ankle fracture    Stress fracture   Anxiety    Aortic atherosclerosis (HCC)    Asthma    border line has inhaler   Bunion    Cataracts, bilateral    Corneal scar    left eye   CPAP (continuous positive  airway pressure) dependence    Degenerative disc disease    Depression    Diverticulosis of colon (without mention of hemorrhage) 2010   Colonoscopy   Dry eyes    bilateral   Enterocele    External hemorrhoids 2000   Colonoscopy   Family history of malignant neoplasm of gastrointestinal tract    Female cystocele    Fibromyalgia    GERD (gastroesophageal reflux disease)    Hiatal hernia 1610,9604   EGD   History of bronchitis  History of measles    History of mumps    History of strep sore throat    History of urinary tract infection    Hyperlipemia    Hypothyroidism    IBS (irritable bowel syndrome)    Imbalance    Internal hemorrhoids without mention of complication 1995,2005   Colonoscopy    Itching    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HCC)    Menopause    Migraine    OSA (obstructive sleep apnea) 05/14/2015   on BiPAP   Osteopenia 10/2013   T score -1.6 FRAX 10%/1.6%   PAF (paroxysmal atrial fibrillation) (HCC)    Pancreatitis    PCO (posterior capsular opacification)    left   Pneumonia    childhood illness   PONV (postoperative nausea and vomiting)    Pseudophakia, both eyes    PVD (posterior vitreous detachment) right   Rash    on back    Retinal scar    left   Rotator cuff disorder    pain, left shoulder   Shingles    Stricture and stenosis of esophagus 2005,2010   EGD    Stroke (HCC)    Ulcerative colitis (HCC)    Varicose veins    Vertigo    Wears glasses      OBJECTIVE:  Physical Exam Vitals and nursing note reviewed.  Constitutional:      Appearance: Normal appearance. She is normal weight.  Pulmonary:     Effort: Pulmonary effort is normal.  Abdominal:     General: Abdomen is flat. Bowel sounds are normal.     Palpations: Abdomen is soft.     Tenderness: There is no right CVA tenderness, left CVA tenderness, guarding or rebound.  Neurological:     Mental Status: She is alert.  Psychiatric:        Mood and Affect: Mood normal.        Thought  Content: Thought content normal.        Judgment: Judgment normal.       Urine dipstick shows positive for RBC's.  Micro exam: 3-10 RBC's per HPF.    Blood pressure 120/76.    Raynelle Fanning, CMA for exam.  ASSESSMENT/PLAN: 1. Urinary frequency - Urinalysis,Complete w/RFL Culture  2. Microscopic hematuria Will send urine culture, if no infection will send to urology     Push fluids, avoid bladder irritants. Instructed she may use Pyridium OTC prn. Call or return to clinic prn if these symptoms worsen or fail to improve as anticipated. Pyelo precautions reviewed with patient.   Arlie Solomons B WHNP-BC 3:36 PM 12/10/2022

## 2022-12-12 LAB — URINALYSIS, COMPLETE W/RFL CULTURE
Ketones, ur: NEGATIVE
Protein, ur: NEGATIVE
Specific Gravity, Urine: 1.02 (ref 1.001–1.035)
pH: 5.5 (ref 5.0–8.0)

## 2022-12-12 LAB — URINE CULTURE
MICRO NUMBER:: 15193431
Result:: NO GROWTH
SPECIMEN QUALITY:: ADEQUATE

## 2022-12-12 LAB — CULTURE INDICATED

## 2022-12-13 ENCOUNTER — Encounter: Payer: Self-pay | Admitting: Cardiology

## 2022-12-13 ENCOUNTER — Ambulatory Visit: Payer: Medicare PPO | Attending: Cardiology | Admitting: Cardiology

## 2022-12-13 VITALS — BP 110/66 | HR 67 | Wt 149.2 lb

## 2022-12-13 DIAGNOSIS — M7989 Other specified soft tissue disorders: Secondary | ICD-10-CM | POA: Diagnosis not present

## 2022-12-13 DIAGNOSIS — I480 Paroxysmal atrial fibrillation: Secondary | ICD-10-CM | POA: Diagnosis not present

## 2022-12-13 DIAGNOSIS — G4733 Obstructive sleep apnea (adult) (pediatric): Secondary | ICD-10-CM | POA: Diagnosis not present

## 2022-12-13 NOTE — Progress Notes (Signed)
Cardiology Office Note:    Date:  12/13/2022   ID:  Kimberly [name], DOB 21-Feb-1941, MRN [id number]  PCP:  Georgann Housekeeper, MD  Cardiologist:  Armanda Magic, MD    Referring MD: Joya Martyr, MD   Chief Complaint  Patient presents with   Sleep Apnea   Atrial Fibrillation    History of Present Illness:    Kimberly [name] is a 82 y.o. female with a hx of PAF on metoprolol and Eliquis for chads vas score of 5.  Metoprolol decreased by [facility] because of bradycardia.  She also has a history of chest pain with normal nuclear stress test and syncope felt secondary to dehydration.  She has a chronic RBBB. Due to chest pain a coronary CTA was done 11/2018 and showed  a calcium score of 0 and no evidence of CAD.  She has moderate OSA with an AHI of 16/hr on HST.  She has chronic chest discomfort with stress.  She was seen by Dr. Eldridge Dace i as a work in on 09/14/2022 for lower extremity edema.  Lower extremity Doppler showed no DVT and she was placed on antibiotics by her PCP for cellulitis. She was encouraged to keep her legs elevated and continue with diuretic therapy and compression hose as tolerated for LE edema. .  She is here today for followup and is doing well.  She denies any exertional chest pain or pressure, SOB, DOE (except with allergy flares), PND, orthopnea, LE edema, dizziness, palpitations or syncope. She is compliant with her meds and is tolerating meds with no SE.   She is going to have a colonoscopy done because she is worried that she had polyps 3 years ago removed and she is worried that they may have come back.  She has been under a lot of stress with her husband passing in April 24 and now her son is in the hospital with a stroke.    She is doing well with her PAP device and thinks that she has gotten used to it.  She tolerates the mask and feels the pressure is adequate.  Since going on PAP she feels rested in the am and has no significant daytime sleepiness.   She denies any significant mouth or nasal dryness or nasal congestion.  She does not think that he snores.    Past Medical History:  Diagnosis Date   Allergy    Anal polyp 1998   Flex Sig    Anemia    Angular blepharitis of left eye    Ankle fracture    Stress fracture   Anxiety    Aortic atherosclerosis (HCC)    Asthma    border line has inhaler   Bunion    Cataracts, bilateral    Corneal scar    left eye   CPAP (continuous positive airway pressure) dependence    Degenerative disc disease    Depression    Diverticulosis of colon (without mention of hemorrhage) 2010   Colonoscopy   Dry eyes    bilateral   Enterocele    External hemorrhoids 2000   Colonoscopy   Family history of malignant neoplasm of gastrointestinal tract    Female cystocele    Fibromyalgia    GERD (gastroesophageal reflux disease)    Hiatal hernia 2005,2010   EGD   History of bronchitis    History of measles    History of mumps    History of strep sore throat    History  of urinary tract infection    Hyperlipemia    Hypothyroidism    IBS (irritable bowel syndrome)    Imbalance    Internal hemorrhoids without mention of complication 1995,2005   Colonoscopy    Itching    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HCC)    Menopause    Migraine    OSA (obstructive sleep apnea) 05/14/2015   on BiPAP   Osteopenia 10/2013   T score -1.6 FRAX 10%/1.6%   PAF (paroxysmal atrial fibrillation) (HCC)    Pancreatitis    PCO (posterior capsular opacification)    left   Pneumonia    childhood illness   PONV (postoperative nausea and vomiting)    Pseudophakia, both eyes    PVD (posterior vitreous detachment) right   Rash    on back    Retinal scar    left   Rotator cuff disorder    pain, left shoulder   Shingles    Stricture and stenosis of esophagus 2005,2010   EGD    Stroke (HCC)    Ulcerative colitis (HCC)    Varicose veins    Vertigo    Wears glasses     Past Surgical History:  Procedure Laterality Date    ABDOMINAL HYSTERECTOMY     ANAL FISSURE REPAIR     BREAST EXCISIONAL BIOPSY Left    BUNIONECTOMY  2014   CATARACT EXTRACTION Bilateral 2013   COLONOSCOPY     polyp removed   ESOPHAGEAL DILATION     EYE SURGERY Left    FOOT SURGERY     left    HEMORRHOID SURGERY     MOUTH SURGERY  11/13/11   Cyst removed from gum-benign    NASAL SEPTUM SURGERY     NASAL SEPTUM SURGERY  1970's   REPLACEMENT TOTAL KNEE Right 2011   TONSILLECTOMY     TOTAL ABDOMINAL HYSTERECTOMY W/ BILATERAL SALPINGOOPHORECTOMY  1991   TAH BSO   TOTAL HIP ARTHROPLASTY     right    TOTAL KNEE ARTHROPLASTY     both   TOTAL KNEE ARTHROPLASTY Left 06/23/2015   Procedure: LEFT TOTAL KNEE ARTHROPLASTY;  Surgeon: Ollen Gross, MD;  Location: [facility];  Service: Orthopedics;  Laterality: Left;    Current Medications: Current Meds  Medication Sig   acetaminophen (TYLENOL) 500 MG tablet Take 500 mg by mouth every 6 (six) hours as needed for moderate pain.   albuterol (VENTOLIN HFA) 108 (90 Base) MCG/ACT inhaler Inhale 2 puffs into the lungs every 6 (six) hours as needed for wheezing or shortness of breath.   azelastine (ASTELIN) 0.1 % nasal spray Place 2 sprays into both nostrils 2 (two) times daily as needed for rhinitis. Use in each nostril as directed   bisacodyl (DULCOLAX) 5 MG EC tablet Take 5 mg by mouth daily as needed for moderate constipation.   CALTRATE 600+D3 SOFT 600-20 MG-MCG CHEW daily at 6 (six) AM.   Cholecalciferol 100 MCG (4000 UT) TABS Take by mouth.   CLARITIN 10 MG tablet    cycloSPORINE (RESTASIS) 0.05 % ophthalmic emulsion Place 1 drop into both eyes 2 (two) times daily.   diltiazem (CARDIZEM CD) 240 MG 24 hr capsule TAKE 1 CAPSULE BY MOUTH EVERY DAY   ELIQUIS 5 MG TABS tablet TAKE 1 TABLET BY MOUTH TWICE A DAY   EPINEPHrine 0.3 mg/0.3 mL IJ SOAJ injection Inject 0.3 mg into the muscle as needed (for allergic reaction). Reported on 05/28/2015   fluticasone (FLONASE) 50 MCG/ACT nasal spray PLACE 2  SPRAYS INTO BOTH NOSTRILS 2 (TWO) TIMES DAILY AS NEEDED FOR ALLERGIES.   furosemide (LASIX) 40 MG tablet Take 20 mg by mouth daily.   ipratropium (ATROVENT) 0.06 % nasal spray Place 2 sprays into both nostrils 3 (three) times daily.   KLOR-CON M10 10 MEQ tablet Take 10 mEq by mouth 2 (two) times daily.   levothyroxine (SYNTHROID) 100 MCG tablet Take 100 mcg by mouth every morning.   meclizine (ANTIVERT) 12.5 MG tablet Take 2 tablets by mouth as directed.   MIRALAX 17 GM/SCOOP powder Take 17 g by mouth as needed.   montelukast (SINGULAIR) 10 MG tablet TAKE 1 TABLET BY MOUTH EVERYDAY AT BEDTIME   Na Sulfate-K Sulfate-Mg Sulf 17.5-3.13-1.6 GM/177ML SOLN Use as directed; may use generic; goodrx card if insurance will not cover generic   omeprazole (PRILOSEC) 20 MG capsule    ondansetron (ZOFRAN ODT) 4 MG disintegrating tablet Take 1 tablet (4 mg total) by mouth every 6 (six) hours as needed.   Polyethyl Glycol-Propyl Glycol (SYSTANE) 0.4-0.3 % SOLN Apply 2 drops to eye daily as needed.   PROAIR HFA 108 (90 Base) MCG/ACT inhaler Inhale 1-2 puffs into the lungs every 6 (six) hours as needed for wheezing.   Probiotic Product (ALIGN) 4 MG CAPS Take 1 capsule by mouth daily.   psyllium (METAMUCIL) 58.6 % powder Take 1 packet by mouth daily as needed (constipation).   rosuvastatin (CRESTOR) 10 MG tablet Take 5 mg by mouth at bedtime.   triamcinolone cream (KENALOG) 0.1 % Apply 1 Application topically 2 (two) times daily as needed.     Allergies:   Amoxicillin-pot clavulanate, Azithromycin, Erythromycin, Almond oil, Amoxicillin, Atorvastatin, Clavulanic acid, Codeine, Ketoconazole, Ketoconazole, Latex, Morphine, Shellfish allergy, Simvastatin, Sulfamethoxazole-trimethoprim, Clindamycin, Clindamycin/lincomycin, and Erythromycin base   Social History   Socioeconomic History   Marital status: Married    Spouse name: Not on file   Number of children: 1   Years of education: bus.school   Highest  education level: Not on file  Occupational History   Occupation: Retired    Associate Professor: RETIRED  Tobacco Use   Smoking status: Former    Current packs/day: 0.00    Average packs/day: 0.3 packs/day for 15.0 years (3.8 ttl pk-yrs)    Types: Cigarettes    Start date: 05/31/1960    Quit date: 06/01/1975    Years since quitting: 47.5   Smokeless tobacco: Never   Tobacco comments:    Stopped smoking age 24  Vaping Use   Vaping status: Never Used  Substance and Sexual Activity   Alcohol use: No    Alcohol/week: 0.0 standard drinks of alcohol   Drug use: No   Sexual activity: Never    Birth control/protection: Surgical    Comment: HYST, first intercourse >16, sexual partner less than 5  Other Topics Concern   Not on file  Social History Narrative   Daily caffeine    Social Determinants of Health   Financial Resource Strain: Not on file  Food Insecurity: Not on file  Transportation Needs: Not on file  Physical Activity: Not on file  Stress: Not on file  Social Connections: Not on file     Family History: The patient's family history includes Alcohol abuse in her father; Allergies in her sister; Allergy (severe) in her sister; Alzheimer's disease in her mother; Arthritis in her brother; Breast cancer (age of onset: 4) in her sister; Colon cancer in her paternal aunt; Colon polyps in her brother and sister; Congestive Heart Failure in  her father; Dementia in her mother; Diabetes in her sister; Heart attack in her maternal grandfather, maternal grandmother, and paternal grandfather; Heart disease in her maternal grandmother; Hypertension in her father; Irritable bowel syndrome in her mother; Other in her sister and sister; Stroke in her mother. There is no history of Esophageal cancer, Rectal cancer, or Stomach cancer.  ROS:   Please see the history of present illness.    ROS  All other systems reviewed and negative.   EKGs/Labs/Other Studies Reviewed:    The following studies were  reviewed today: none   Recent Labs: 05/20/2022: ALT 12; BUN 14; Creatinine, Ser 0.76; Hemoglobin 13.7; Platelets 251.0; Potassium 3.7; Sodium 140; TSH 5.65   Recent Lipid Panel    Component Value Date/Time   CHOL 124 07/23/2013 0632   TRIG 69 07/23/2013 0632   HDL 68 07/23/2013 0632   CHOLHDL 1.8 07/23/2013 0632   VLDL 14 07/23/2013 0632   LDLCALC 42 07/23/2013 1610    Physical Exam:    VS:  BP 110/66   Pulse 67   Wt 149 lb 3.2 oz (67.7 kg)   LMP  (LMP Unknown)   SpO2 98%   BMI 24.08 kg/m     Wt Readings from Last 3 Encounters:  12/13/22 149 lb 3.2 oz (67.7 kg)  10/20/22 150 lb (68 kg)  10/19/22 149 lb (67.6 kg)    GEN: Well nourished, well developed in no acute distress HEENT: Normal NECK: No JVD; No carotid bruits LYMPHATICS: No lymphadenopathy CARDIAC:RRR, no murmurs, rubs, gallops RESPIRATORY:  Clear to auscultation without rales, wheezing or rhonchi  ABDOMEN: Soft, non-tender, non-distended MUSCULOSKELETAL:  No edema; No deformity  SKIN: Warm and dry NEUROLOGIC:  Alert and oriented x 3 PSYCHIATRIC:  Normal affect  ASSESSMENT:    1. PAF (paroxysmal atrial fibrillation) (HCC)   2. OSA (obstructive sleep apnea)   3. Leg swelling     PLAN:    In order of problems listed above:  1.  PAF  -She remains in normal sinus rhythm and really does not have any palpitations unless she is under stress -No frank bleeding problems on DOAC but saw her GYN recently and had hematuria and is going back to see her GYN soon to decide what to do.  Hbg in June 2024 was normal at 12.1 -Continue prescription drug management with Eliquis 5 mg twice daily and Cardizem CD 240 mg daily with as needed refills -I have personally reviewed and interpreted outside labs performed by patient's PCP which showed Hbg 12.1 and SCr 0.84 and K+ 3.8 in June 2024   2.  OSA - she stopped using her CPAP due to all the health issues with her son and husband and her husband's passing -she is going to  try to get back on it  3.  Chronic lower extremity edema -Recently  worsened due to cellulitis which was treated with antibiotics and diuretics by PCP/lower extremity Doppler was negative for DVT and likely has a component of chronic venous insufficiency -Continue prescription drug management with Lasix 20 mg daily with as needed refills  4.  Noncardiac CP -this has been going on for years and had a normal coronary CTA in 2020 -mainly comes on with stress  Followup with me in 1 year   Medication Adjustments/Labs and Tests Ordered: Current medicines are reviewed at length with the patient today.  Concerns regarding medicines are outlined above.  No orders of the defined types were placed in this encounter.  No  orders of the defined types were placed in this encounter.   Signed, Armanda Magic, MD  12/13/2022 11:21 AM    [facility]

## 2022-12-13 NOTE — Patient Instructions (Signed)
 Medication Instructions:  Your physician recommends that you continue on your current medications as directed. Please refer to the Current Medication list given to you today.  *If you need a refill on your cardiac medications before your next appointment, please call your pharmacy*   Lab Work: None.  If you have labs (blood work) drawn today and your tests are completely normal, you will receive your results only by: MyChart Message (if you have MyChart) OR A paper copy in the mail If you have any lab test that is abnormal or we need to change your treatment, we will call you to review the results.   Testing/Procedures: None.   Follow-Up: At [facility], you and your health needs are our priority.  As part of our continuing mission to provide you with exceptional heart care, we have created designated Provider Care Teams.  These Care Teams include your primary Cardiologist (physician) and Advanced Practice Providers (APPs -  Physician Assistants and Nurse Practitioners) who all work together to provide you with the care you need, when you need it.  We recommend signing up for the patient portal called "MyChart".  Sign up information is provided on this After Visit Summary.  MyChart is used to connect with patients for Virtual Visits (Telemedicine).  Patients are able to view lab/test results, encounter notes, upcoming appointments, etc.  Non-urgent messages can be sent to your provider as well.   To learn more about what you can do with MyChart, go to [email or web address].    Your next appointment:   1 year(s)  Provider:   Traci Turner, MD     

## 2022-12-14 ENCOUNTER — Telehealth: Payer: Self-pay | Admitting: Cardiology

## 2022-12-14 ENCOUNTER — Telehealth: Payer: Self-pay | Admitting: Internal Medicine

## 2022-12-14 NOTE — Telephone Encounter (Signed)
Patient is extremely concerned about taking Suprep as prescribed for colonoscopy on 12/24/22. States she has heart problems and takes lasix as well as potassium. Read box instructions on Suprep and notes that there are bolded bullet points discussing heart conditions and potassium issues. Patient states that the last prep she had was not like this and was pretty "easy." I explained that pretty much all of the preps contain the same active ingredients and can alter electrolytes because it causes multiple bowel movements, therefore causing electrolyte loss. I also advised that this is a one time prep that does not typically cause problems. I did research the last prep patient was given which turns out to be Suprep (2020). Patient states she doesn't remember having this before and instead remembers drinking gatorade. I am unable to find any documentation of rx change.  However, due to patient concerns, I have offered to change her to a Miralax/gatorade prep. Patient indicates that she would be more comfortable with this. I have created new instructions and placed them at the front desk for her to pick up (she does not have access to her mychart at this time due to computer security issues at home).

## 2022-12-14 NOTE — Telephone Encounter (Signed)
Spoke with patient to answer her questions and let her know that our pharmacists reviewed her medications and states it will be safe for her to do her prep for her colonoscopy.  Patient had no further concerns or questions.

## 2022-12-14 NOTE — Telephone Encounter (Signed)
Inbound call from patient stating she has some concerns regarding prep medication interfering with her Eliquis. Requesting a call back to discuss concerns further. Please advise, thank you.

## 2022-12-14 NOTE — Telephone Encounter (Signed)
Pt states she has to start taking sodium sulfate, potassium sulfate, and magnesium sulfate prior to her colonoscopy. She states she read about bad side effects and wants to know if this is safe to take. Please advise.

## 2022-12-14 NOTE — Telephone Encounter (Signed)
This is her required bowel prep before her colonoscopy, it's fine for her to take.

## 2022-12-20 NOTE — Telephone Encounter (Signed)
Patient calling to see how long she is suppose to hold her Eliquis. Patient states that she needs to know today. Please advise

## 2022-12-20 NOTE — Telephone Encounter (Signed)
Inbound call from patient requesting a call back to discuss a few more concerns regarding colonoscopy. She states she does not want to cancel as of now but like to speak further about prep instructions. Please advise, thank you.

## 2022-12-21 NOTE — Telephone Encounter (Signed)
Patient calling in asking about pre-op clearance to hold eliquis for colonoscopy on 12/24/22. No pre-op clearance request appears to be on file. Spoke with Jannifer Hick at Dr. Lauro Franklin office and advised of pre-op clearance request protocols.   Also advised patient that she needs to keep taking her eliquis until she is advised to hold it by our office. Also advised her that we may not be able to provide clearance in time for her 12/24/22 colonoscopy date, patient verbalizes understanding. Patient states she may call Dr. Lauro Franklin office and go ahead and reschedule colonoscopy so she can ensure she does not miss a dose of eliquis.

## 2022-12-21 NOTE — Telephone Encounter (Signed)
Called patient to advise that cardiac clearance was in fact sent over to Dr. Lauro Franklin office in May. She is supposed to hold her eliquis for 2 days. Advised patient to reschedule her colonoscopy as soon as she can as anticoagulation recommendations are in place. Patient verbalized understanding.

## 2022-12-21 NOTE — Telephone Encounter (Signed)
See 12/14/22 gastroenterology telephone note for additional information regarding Eliquis.

## 2022-12-21 NOTE — Telephone Encounter (Signed)
Inbound call from patient requesting to cancel scheduled colonoscopy for 7/26 due to needing Eliquis holding clearance. Patient states she will call back next week to reschedule. Please advise, thank you.

## 2022-12-21 NOTE — Telephone Encounter (Signed)
Spoke to patient to advise that we did get eliquis clearance from cardiology on 10/20/22. Spoke with patient 10/21/22 to discuss recommendations of holding eliquis 2 days prior to the test. Patient verbalizes understanding but states that she still needs to cancel colonoscopy as of now because her son had just had a major stroke and is currently in the hospital with multiple deficits and uncontrolled seizures. She also states that a mass was found on his kidney, so she needs to be with him. Advised I will cancel colonoscopy as requested for now. Patient states she may decide to make another office appointment with Dr Rhea Belton in the near future to again discuss colonoscopy.

## 2022-12-21 NOTE — Telephone Encounter (Signed)
We received clearance to hold her Eliquis shortly after I saw her in May of this year  Please communicate this to the patient and she should be able to keep her scheduled colonoscopy If there are other issues please advise

## 2022-12-24 ENCOUNTER — Encounter: Payer: Medicare PPO | Admitting: Internal Medicine

## 2022-12-31 ENCOUNTER — Telehealth: Payer: Self-pay

## 2022-12-31 NOTE — Telephone Encounter (Signed)
Pt calling to report already being established with [facility] Dr. Bea Graff and has scheduled an appt to see her for her concerns of the bladder prolapse and recent bladder sxs seen on 12/10/22 by JC for.  Pt just wanted to provide that update. Will route to provider for review and close encounter.

## 2023-01-03 DIAGNOSIS — J309 Allergic rhinitis, unspecified: Secondary | ICD-10-CM | POA: Diagnosis not present

## 2023-01-03 DIAGNOSIS — R0981 Nasal congestion: Secondary | ICD-10-CM | POA: Diagnosis not present

## 2023-01-04 DIAGNOSIS — D2271 Melanocytic nevi of right lower limb, including hip: Secondary | ICD-10-CM | POA: Diagnosis not present

## 2023-01-04 DIAGNOSIS — Z85828 Personal history of other malignant neoplasm of skin: Secondary | ICD-10-CM | POA: Diagnosis not present

## 2023-01-04 DIAGNOSIS — L814 Other melanin hyperpigmentation: Secondary | ICD-10-CM | POA: Diagnosis not present

## 2023-01-04 DIAGNOSIS — D2272 Melanocytic nevi of left lower limb, including hip: Secondary | ICD-10-CM | POA: Diagnosis not present

## 2023-01-04 DIAGNOSIS — L821 Other seborrheic keratosis: Secondary | ICD-10-CM | POA: Diagnosis not present

## 2023-01-06 ENCOUNTER — Other Ambulatory Visit: Payer: Self-pay

## 2023-01-06 MED ORDER — DILTIAZEM HCL ER COATED BEADS 240 MG PO CP24: 240.0000 mg | ORAL_CAPSULE | Freq: Every day | ORAL | 3 refills | Status: DC

## 2023-01-19 NOTE — Progress Notes (Unsigned)
Follow Up Note  RE: Niomie Fadley MRN: [id number] DOB: Jul 20, 1940 Date of Office Visit: 01/20/2023  Referring provider: Blima Singer, PA Primary care provider: Georgann Housekeeper, MD  Chief Complaint: No chief complaint on file.  History of Present Illness: I had the pleasure of seeing Annalea Gladney for a follow up visit at the [facility] on 01/19/2023. She is a 82 y.o. female, who is being followed for allergic rhinitis, dry eyes, adverse food reaction. Her previous allergy office visit was on 06/09/2021 with Dr. Lucie Leather. Today is a regular follow up visit.  2022 blood work was positive dust mites. 2022 bloodwork borderline positive to shellfish  1. Perennial allergic rhinitis   2. Dry eye syndrome of both eyes   3. Adverse food reaction, subsequent encounter   4. History of asthma   5. Benign paroxysmal vertigo of both ears       1.  Continue to treat and prevent inflammation:               A. Fluticasone - 1-2 sprays each nostril 1 time per day             B. Montelukast 10 mg - 1 tablet 1 time per day   2. If needed:               A. Azelastine- 1-2 sprays each nostril 1-2 times per day             B. Systane eye drops             C. Epi-Pen, benadryl, MD/ER evaluation for allergic reaction             D. Albuterol HFA - 2 inhalations every 4-6 hours             E.  Meclizine 12.5 mg - 1 tablet every 6 hours   3.  Do not use any oral antihistamines secondary to dry eye   4.  Return to clinic in 6 months or earlier if needed   Krisley appears to be doing pretty good on her plan of anti-inflammatory agents for her airway as noted above and her dry eye syndrome is a lot better while trying to remain away from the use of oral antihistamines.  She has developed some vertigo and this appears to be improving on its own and I suspect within a few more weeks this will probably resolve.  She is certainly welcome to use meclizine should it be required.  Her  history of asthma does not require any further evaluation at this point.  I will see her back in this clinic in 6 months or earlier if there is a problem.    Assessment and Plan: Aashka is a 82 y.o. female with: ***  No follow-ups on file.  No orders of the defined types were placed in this encounter.  Lab Orders  No laboratory test(s) ordered today    Diagnostics: Spirometry:  Tracings reviewed. Her effort: {Blank single:19197::"Good reproducible efforts.","It was hard to get consistent efforts and there is a question as to whether this reflects a maximal maneuver.","Poor effort, data can not be interpreted."} FVC: ***L FEV1: ***L, ***% predicted FEV1/FVC ratio: ***% Interpretation: {Blank single:19197::"Spirometry consistent with mild obstructive disease","Spirometry consistent with moderate obstructive disease","Spirometry consistent with severe obstructive disease","Spirometry consistent with possible restrictive disease","Spirometry consistent with mixed obstructive and restrictive disease","Spirometry uninterpretable due to technique","Spirometry consistent with normal pattern","No overt abnormalities noted given today's efforts"}.  Please  see scanned spirometry results for details.  Skin Testing: {Blank single:19197::"Select foods","Environmental allergy panel","Environmental allergy panel and select foods","Food allergy panel","None","Deferred due to recent antihistamines use"}. *** Results discussed with patient/family.   Medication List:  Current Outpatient Medications  Medication Sig Dispense Refill  . acetaminophen (TYLENOL) 500 MG tablet Take 500 mg by mouth every 6 (six) hours as needed for moderate pain.    Marland Kitchen albuterol (VENTOLIN HFA) 108 (90 Base) MCG/ACT inhaler Inhale 2 puffs into the lungs every 6 (six) hours as needed for wheezing or shortness of breath. 18 g 3  . azelastine (ASTELIN) 0.1 % nasal spray Place 2 sprays into both nostrils 2 (two) times daily as needed  for rhinitis. Use in each nostril as directed 30 mL 5  . bisacodyl (DULCOLAX) 5 MG EC tablet Take 5 mg by mouth daily as needed for moderate constipation.    Marland Kitchen CALTRATE 600+D3 SOFT 600-20 MG-MCG CHEW daily at 6 (six) AM.    . Cephalexin 500 MG tablet TAKE 1 TABLET BY MOUTH FOUR TIMES A DAY FOR 7 DAYS (Patient not taking: Reported on 12/13/2022)    . Cholecalciferol 100 MCG (4000 UT) TABS Take by mouth.    Marland Kitchen CLARITIN 10 MG tablet     . cycloSPORINE (RESTASIS) 0.05 % ophthalmic emulsion Place 1 drop into both eyes 2 (two) times daily.    Marland Kitchen diltiazem (CARDIZEM CD) 240 MG 24 hr capsule Take 1 capsule (240 mg total) by mouth daily. 90 capsule 3  . doxycycline (VIBRA-TABS) 100 MG tablet Take 100 mg by mouth 2 (two) times daily. (Patient not taking: Reported on 12/13/2022)    . ELIQUIS 5 MG TABS tablet TAKE 1 TABLET BY MOUTH TWICE A DAY 180 tablet 1  . EPINEPHrine 0.3 mg/0.3 mL IJ SOAJ injection Inject 0.3 mg into the muscle as needed (for allergic reaction). Reported on 05/28/2015 2 each 2  . fluticasone (FLONASE) 50 MCG/ACT nasal spray PLACE 2 SPRAYS INTO BOTH NOSTRILS 2 (TWO) TIMES DAILY AS NEEDED FOR ALLERGIES. 16 g 0  . furosemide (LASIX) 40 MG tablet Take 20 mg by mouth daily.    Marland Kitchen ipratropium (ATROVENT) 0.06 % nasal spray Place 2 sprays into both nostrils 3 (three) times daily.    Marland Kitchen KLOR-CON M10 10 MEQ tablet Take 10 mEq by mouth 2 (two) times daily.    Marland Kitchen levothyroxine (SYNTHROID) 100 MCG tablet Take 100 mcg by mouth every morning.    . meclizine (ANTIVERT) 12.5 MG tablet Take 2 tablets by mouth as directed.    Marland Kitchen MIRALAX 17 GM/SCOOP powder Take 17 g by mouth as needed.    . montelukast (SINGULAIR) 10 MG tablet TAKE 1 TABLET BY MOUTH EVERYDAY AT BEDTIME 30 tablet 0  . Na Sulfate-K Sulfate-Mg Sulf 17.5-3.13-1.6 GM/177ML SOLN Use as directed; may use generic; goodrx card if insurance will not cover generic 354 mL 0  . omeprazole (PRILOSEC) 20 MG capsule     . ondansetron (ZOFRAN ODT) 4 MG  disintegrating tablet Take 1 tablet (4 mg total) by mouth every 6 (six) hours as needed. 20 tablet 0  . Polyethyl Glycol-Propyl Glycol (SYSTANE) 0.4-0.3 % SOLN Apply 2 drops to eye daily as needed. 30 mL 5  . PROAIR HFA 108 (90 Base) MCG/ACT inhaler Inhale 1-2 puffs into the lungs every 6 (six) hours as needed for wheezing. 18 g 3  . Probiotic Product (ALIGN) 4 MG CAPS Take 1 capsule by mouth daily.    . psyllium (METAMUCIL) 58.6 % powder Take  1 packet by mouth daily as needed (constipation).    . rosuvastatin (CRESTOR) 10 MG tablet Take 5 mg by mouth at bedtime.  4  . triamcinolone cream (KENALOG) 0.1 % Apply 1 Application topically 2 (two) times daily as needed.     No current facility-administered medications for this visit.   Allergies: Allergies  Allergen Reactions  . Amoxicillin-Pot Clavulanate Nausea Only, Other (See Comments) and Swelling    Has patient had a PCN reaction causing immediate rash, facial/tongue/throat swelling, SOB or lightheadedness with hypotension: No  Has patient had a PCN reaction causing severe rash involving mucus membranes or skin necrosis: No  Has patient had a PCN reaction that required hospitalization No  Has patient had a PCN reaction occurring within the last 10 years: No  If all of the above answers are "NO", then may proceed with Cephalosporin use.  Other reaction(s): nausea  Other Reaction(s): GI Intolerance  . Azithromycin Other (See Comments)    Pancreatitis  Other reaction(s): Other, pancreatitis  Other Reaction(s): Other (See Comments)  pancreatitis  . Erythromycin Hives and Other (See Comments)    Reaction 1 time, when she was younger.  Other reaction(s): hives  . Almond Oil Other (See Comments)    Almonds: per allergy testing.  . Amoxicillin Nausea Only  . Atorvastatin Other (See Comments)    Muscle weakness  Muscle weakness  Other reaction(s): muscle weakness, Other  . Clavulanic Acid Nausea Only    Other Reaction(s): GI  Intolerance  . Codeine Nausea Only and Other (See Comments)    Other reaction(s): Nausea/Vomiting  Other Reaction(s): GI Intolerance  . Ketoconazole Nausea And Vomiting  . Ketoconazole Nausea And Vomiting, Nausea Only and Other (See Comments)    Other reaction(s): Unknown  Other Reaction(s): GI Intolerance  . Latex Other (See Comments)    Other  Other Reaction(s): Other (See Comments)  Other,  . Morphine Nausea Only    Other reaction(s): Unknown  Other Reaction(s): GI Intolerance  . Shellfish Allergy Other (See Comments)    Shellfish mix: per allergy testing.  . Simvastatin Other (See Comments)    Muscle pain Other reaction(s): muscle pain, Other  . Sulfamethoxazole-Trimethoprim Nausea Only    Upset stomach  Other Reaction(s): GI Intolerance  . Clindamycin Rash  . Clindamycin/Lincomycin Nausea And Vomiting and Rash  . Erythromycin Base Rash   I reviewed her past medical history, social history, family history, and environmental history and no significant changes have been reported from her previous visit.  Review of Systems  Constitutional:  Negative for appetite change, chills, fever and unexpected weight change.  HENT:  Negative for congestion and rhinorrhea.   Eyes:  Negative for itching.  Respiratory:  Negative for cough, chest tightness, shortness of breath and wheezing.   Cardiovascular:  Negative for chest pain.  Gastrointestinal:  Negative for abdominal pain.  Genitourinary:  Negative for difficulty urinating.  Skin:  Negative for rash.  Allergic/Immunologic: Positive for environmental allergies and food allergies.  Neurological:  Negative for headaches.    Objective: LMP  (LMP Unknown)  There is no height or weight on file to calculate BMI. Physical Exam Vitals and nursing note reviewed.  Constitutional:      Appearance: Normal appearance. She is well-developed.  HENT:     Head: Normocephalic and atraumatic.     Right Ear: Tympanic membrane and  external ear normal.     Left Ear: Tympanic membrane and external ear normal.     Nose: Nose normal.  Mouth/Throat:     Mouth: Mucous membranes are moist.     Pharynx: Oropharynx is clear.  Eyes:     Conjunctiva/sclera: Conjunctivae normal.  Cardiovascular:     Rate and Rhythm: Normal rate and regular rhythm.     Heart sounds: Normal heart sounds. No murmur heard.    No friction rub. No gallop.  Pulmonary:     Effort: Pulmonary effort is normal.     Breath sounds: Normal breath sounds. No wheezing, rhonchi or rales.  Musculoskeletal:     Cervical back: Neck supple.  Skin:    General: Skin is warm.     Findings: No rash.  Neurological:     Mental Status: She is alert and oriented to person, place, and time.  Psychiatric:        Behavior: Behavior normal.   Previous notes and tests were reviewed. The plan was reviewed with the patient/family, and all questions/concerned were addressed.  It was my pleasure to see Jeffifer today and participate in her care. Please feel free to contact me with any questions or concerns.  Sincerely,  Wyline Mood, DO Allergy & Immunology  [facility] office: [phone] [facility] office: [phone]

## 2023-01-20 ENCOUNTER — Telehealth: Payer: Self-pay | Admitting: Cardiology

## 2023-01-20 ENCOUNTER — Encounter: Payer: Self-pay | Admitting: Allergy

## 2023-01-20 ENCOUNTER — Ambulatory Visit: Payer: Medicare PPO | Admitting: Allergy

## 2023-01-20 VITALS — BP 98/56 | HR 61 | Temp 98.3°F | Resp 16 | Ht 66.5 in | Wt 152.5 lb

## 2023-01-20 DIAGNOSIS — Z8709 Personal history of other diseases of the respiratory system: Secondary | ICD-10-CM | POA: Diagnosis not present

## 2023-01-20 DIAGNOSIS — T781XXD Other adverse food reactions, not elsewhere classified, subsequent encounter: Secondary | ICD-10-CM | POA: Diagnosis not present

## 2023-01-20 DIAGNOSIS — R031 Nonspecific low blood-pressure reading: Secondary | ICD-10-CM | POA: Diagnosis not present

## 2023-01-20 DIAGNOSIS — J3089 Other allergic rhinitis: Secondary | ICD-10-CM

## 2023-01-20 DIAGNOSIS — H04123 Dry eye syndrome of bilateral lacrimal glands: Secondary | ICD-10-CM

## 2023-01-20 MED ORDER — MONTELUKAST SODIUM 10 MG PO TABS: 10.0000 mg | ORAL_TABLET | Freq: Every day | ORAL | 3 refills | Status: DC

## 2023-01-20 NOTE — Telephone Encounter (Signed)
Called patient back to check on symptoms.  Patient denies lightheadedness, states she is able to drive and perform ADL's. States she has been ill with hematuria and some sort of respiratory infection or virus for the past week. Last VO with Dr. Mayford Knife was 12/13/22 and BP at that time was 110/66.  Offered appt w/ APP Tereso Newcomer on Monday 01/24/23 at 3:35 pm, which patient accepted. Advised patient to go to ED if her BP continue to get lower or if she begins to feel dizzy or lightheaded.

## 2023-01-20 NOTE — Telephone Encounter (Signed)
Pt c/o BP issue: STAT if pt c/o blurred vision, one-sided weakness or slurred speech  1. What are your last 5 BP readings?  90/56  2. Are you having any other symptoms (ex. Dizziness, headache, blurred vision, passed out)?  Weakness, congestion   3. What is your BP issue?  BP is low.

## 2023-01-20 NOTE — Patient Instructions (Addendum)
Sinuses  Start Singulair (montelukast) 10mg  daily at night. Use Flonase (fluticasone) nasal spray 1-2 sprays per nostril once a day as needed for nasal congestion.  Use azelastine nasal spray 1-2 sprays per nostril twice a day as needed for runny nose/drainage. Nasal saline spray (i.e., Simply Saline) or nasal saline lavage (i.e., NeilMed) is recommended as needed and prior to medicated nasal sprays.  Food allergy Continue to avoid shellfish. For mild symptoms you can take over the counter antihistamines such as Benadryl 1-2 tablets = 25-50mg  and monitor symptoms closely.  If symptoms worsen or if you have severe symptoms including breathing issues, throat closure, significant swelling, whole body hives, severe diarrhea and vomiting, lightheadedness then seek immediate medical care.  History of asthma Monitor symptoms.  Low blood pressure  Follow up with your PCP or cardiologist regarding the blood pressure Vitals:   01/20/23 1546 01/20/23 1623  BP: (!) 90/56 (!) 98/56   Follow up in 2 months or sooner if needed.

## 2023-01-20 NOTE — Telephone Encounter (Signed)
Called pt in regards to low BP.  Reports had an OV with allergist today BP was 90/56 and 98/56 in the office.  Was advised to f/u with cardiologist.   Pt does not check BP at home.  Advised pt to start checking BP at least once daily keep a record for MD to review.   Also advised if BP is low to increase fluid intake and eat a small salty snack to help bring BP up.  Pt reports drinks 6-8 glasses of water daily.   Noted in pt chart has hx of hematuria; reports hasn't noted any bleeding.  Has an OV 02/17/23 to recheck bleeding.  Also reports recently completed a 7 day course of amoxicillin for congestion.  Reports phlegm is now clear it was yellow.   Advised pt weakness could be r/t this.  Advised will send message to MD and primary RN to f/u.

## 2023-01-21 ENCOUNTER — Emergency Department (HOSPITAL_BASED_OUTPATIENT_CLINIC_OR_DEPARTMENT_OTHER)
Admission: EM | Admit: 2023-01-21 | Discharge: 2023-01-22 | Disposition: A | Discharge status: Home / Self Care | Payer: Medicare PPO | Attending: Emergency Medicine | Admitting: Emergency Medicine

## 2023-01-21 ENCOUNTER — Telehealth: Payer: Self-pay | Admitting: Physician Assistant

## 2023-01-21 ENCOUNTER — Encounter (HOSPITAL_BASED_OUTPATIENT_CLINIC_OR_DEPARTMENT_OTHER): Payer: Self-pay

## 2023-01-21 ENCOUNTER — Telehealth: Payer: Self-pay | Admitting: Cardiology

## 2023-01-21 DIAGNOSIS — Z91040 Latex allergy status: Secondary | ICD-10-CM | POA: Insufficient documentation

## 2023-01-21 DIAGNOSIS — I952 Hypotension due to drugs: Secondary | ICD-10-CM | POA: Diagnosis not present

## 2023-01-21 DIAGNOSIS — Z7901 Long term (current) use of anticoagulants: Secondary | ICD-10-CM | POA: Diagnosis not present

## 2023-01-21 DIAGNOSIS — E039 Hypothyroidism, unspecified: Secondary | ICD-10-CM | POA: Insufficient documentation

## 2023-01-21 DIAGNOSIS — I959 Hypotension, unspecified: Secondary | ICD-10-CM | POA: Diagnosis not present

## 2023-01-21 DIAGNOSIS — Z79890 Hormone replacement therapy (postmenopausal): Secondary | ICD-10-CM | POA: Diagnosis not present

## 2023-01-21 LAB — BASIC METABOLIC PANEL
Anion gap: 10 (ref 5–15)
BUN: 24 mg/dL — ABNORMAL HIGH (ref 8–23)
CO2: 27 mmol/L (ref 22–32)
Calcium: 9.8 mg/dL (ref 8.9–10.3)
Chloride: 102 mmol/L (ref 98–111)
Creatinine, Ser: 0.93 mg/dL (ref 0.44–1.00)
GFR, Estimated: >60 mL/min (ref >60)
Glucose, Bld: 91 mg/dL (ref 70–99)
Potassium: 3.4 mmol/L — ABNORMAL LOW (ref 3.5–5.1)
Sodium: 139 mmol/L (ref 135–145)

## 2023-01-21 LAB — CBC
HCT: 36.9 % (ref 36.0–46.0)
Hemoglobin: 12.6 g/dL (ref 12.0–15.0)
MCH: 32 pg (ref 26.0–34.0)
MCHC: 34.1 g/dL (ref 30.0–36.0)
MCV: 93.7 fL (ref 80.0–100.0)
Platelets: 238 10*3/uL (ref 150–400)
RBC: 3.94 MIL/uL (ref 3.87–5.11)
RDW: 12.6 % (ref 11.5–15.5)
WBC: 6.3 10*3/uL (ref 4.0–10.5)
nRBC: 0 % (ref 0.0–0.2)

## 2023-01-21 MED ORDER — FUROSEMIDE 40 MG PO TABS: 20.0000 mg | ORAL_TABLET | Freq: Every day | ORAL | Status: DC | PRN

## 2023-01-21 NOTE — Telephone Encounter (Signed)
Pt following up on yesterday's phone note 8/22. Please advise

## 2023-01-21 NOTE — Telephone Encounter (Signed)
Attempted phone call to pt.  Left voicemail message to contact triage at [phone].  May speak with any of the triage nurses.  See also encounter dated 01/20/2023 as we are still waiting on Dr Mayford Knife to advise.

## 2023-01-21 NOTE — Discharge Instructions (Addendum)
Your low blood pressure may be due to your Lasix. - Stop taking your Lasix every day - If you notice worsening swelling in your legs, you can take 1 dose (20mg ) of your lasix. - Please follow-up with your Cardiologist on Monday as scheduled - Please follow-up with your PCP   Please seek emergency help if: - You start to have vision changes  - you pass out or feel like you are going to pass out - You have trouble breathing - you have chest pain

## 2023-01-21 NOTE — ED Triage Notes (Signed)
PT had MD appt yesterday and had sbp in the 90s. Today she has been checking pressures and they have ranged between sbp 80s-189. Pt has a lot of stressors lately. Pt takes diltiazem daily and lasix. Pt was asymptomatic when Bps were fluctuating. No headache or dizziness. Denies CP sob

## 2023-01-21 NOTE — Telephone Encounter (Addendum)
   The patient called the answering service after-hours today, page listed "low blood pressure." Prior phone notes reviewed where patient was reporting SBPs in the 90s. I called patient back and she requested that I call her back in about 10 minutes as she was in the bathroom.  Her BP yesterday at allergy office was 90/56 then 98/56. She had reported that she stopped her KCl supplement as she thought it was making her weak. She had been feeling tired with chills and stuffy nose. She was advised to f/u with cardiology regarding her blood pressure. She had called our office yesterday to discuss medication changes, was awaiting message back. Appt was also scheduled for Monday 01/24/23.  Today BPs:  9:47am 104/63 10:16am 107/61 (Several interim values all reported similarly) Around 2pm 117/66  When I spoke with her she again shares that she's been having issues with congestion and stuffy nose. She thought these were allergies. She just feels unwell/tired. She also reports having issues with hematuria about 2 months ago for which she was evaluated by urology. I explained that the most recent blood pressure today is not acutely concerning to me but if she is feeling unwell she really needs further evaluation specifically for this issue, difficult to evaluate over the phone. Discussed freestanding ED as an option. She will get her brother to help get her there. The patient verbalized understanding and gratitude. Will cc to Dr. Mayford Knife as Lorain Childes. Will also keep appt on Monday with Scott as previously scheduled - will cc to him as FYI as well.  Laurann Montana, PA-C

## 2023-01-21 NOTE — ED Provider Notes (Signed)
EMERGENCY DEPARTMENT AT [facility] Provider Note   CSN: [id number] Arrival date & time: 01/21/23  1928     History  Chief Complaint  Patient presents with   Hypotension    Kimberly [name] is a 82 y.o. female w/ hx of PAF on eliquis, lacunar stroke, fibromyalgia, hypothyroidism, GERD, OA, IBS, depression, anxiety that p/w low Bps.   Pt went to allergist office yesterday, was noted to have low BP. She started checking her home BP, and had SBP ranging 80-110 and DBP in 60s (pt brought in log with her readings). She reported these readings to her Cardiologist, and was directed to ED. Pt denies feeling dizzy, SOB, or chest pain. Denies diarrhea or vomiting. She does report decreased appetite after her husband passed earlier this year. She did have an episode of R thigh cramping this afternoon, which she occasionally gets.   Several weeks ago, she reports going to [facility] for LE edema, was started on 40mg  Lasix daily. However she was feeling weak, dizzy, and having vision changes on this dose, so she cut it in half to 20mg  daily. She was also on oral K supplements, but stopped taking this because she thought it was contributing to her weakness.   Pt take takes eliquius, synthroid, lasix, diltiazem, rosuvastatin - Not taking carvedilol. No other BP meds.         Home Medications Prior to Admission medications   Medication Sig Start Date End Date Taking? Authorizing Provider  acetaminophen (TYLENOL) 500 MG tablet Take 500 mg by mouth every 6 (six) hours as needed for moderate pain.    [provider]  albuterol (VENTOLIN HFA) 108 (90 Base) MCG/ACT inhaler Inhale 2 puffs into the lungs every 6 (six) hours as needed for wheezing or shortness of breath. 06/09/21   Kozlow, Alvira Philips, MD  azelastine (ASTELIN) 0.1 % nasal spray Place 2 sprays into both nostrils 2 (two) times daily as needed for rhinitis. Use in each nostril as directed 06/09/21   Kozlow, Alvira Philips,  MD  CALTRATE 600+D3 SOFT 600-20 MG-MCG CHEW daily at 6 (six) AM. 11/09/21   [provider]  Cholecalciferol 100 MCG (4000 UT) TABS Take by mouth.    [provider]  cycloSPORINE (RESTASIS) 0.05 % ophthalmic emulsion Place 1 drop into both eyes 2 (two) times daily. 09/30/12   [provider]  diltiazem (CARDIZEM CD) 240 MG 24 hr capsule Take 1 capsule (240 mg total) by mouth daily. 01/06/23   Turner, Cornelious Bryant, MD  ELIQUIS 5 MG TABS tablet TAKE 1 TABLET BY MOUTH TWICE A DAY 08/02/22   Armanda Magic R, MD  EPINEPHrine 0.3 mg/0.3 mL IJ SOAJ injection Inject 0.3 mg into the muscle as needed (for allergic reaction). Reported on 05/28/2015 06/09/21   Jessica Priest, MD  fluticasone (FLONASE) 50 MCG/ACT nasal spray PLACE 2 SPRAYS INTO BOTH NOSTRILS 2 (TWO) TIMES DAILY AS NEEDED FOR ALLERGIES. 06/16/22   Kozlow, Alvira Philips, MD  furosemide (LASIX) 40 MG tablet Take 0.5 tablets (20 mg total) by mouth daily as needed for edema. 01/21/23   Lincoln Brigham, MD  ipratropium (ATROVENT) 0.06 % nasal spray Place 2 sprays into both nostrils 3 (three) times daily. 10/16/22   [provider]  KLOR-CON M10 10 MEQ tablet Take 10 mEq by mouth 2 (two) times daily. 09/22/22   [provider]  levothyroxine (SYNTHROID) 100 MCG tablet Take 100 mcg by mouth every morning. 12/31/21   [provider]  meclizine (ANTIVERT) 12.5 MG tablet Take 2 tablets by mouth as directed. Patient not taking: Reported on 01/20/2023    [provider]  [facility] 17 GM/SCOOP powder Take 17 g by mouth as needed. 11/09/21   [provider]  montelukast (SINGULAIR) 10 MG tablet Take 1 tablet (10 mg total) by mouth at bedtime. 01/20/23   Ellamae Sia, DO  Na Sulfate-K Sulfate-Mg Sulf 17.5-3.13-1.6 GM/177ML SOLN Use as directed; may use generic; goodrx card if insurance will not cover generic 10/20/22   Pyrtle, Carie Caddy, MD  omeprazole (PRILOSEC) 20 MG capsule  11/09/21   [provider]  ondansetron  (ZOFRAN ODT) 4 MG disintegrating tablet Take 1 tablet (4 mg total) by mouth every 6 (six) hours as needed. 05/11/20   Ward, Layla Maw, DO  Polyethyl Glycol-Propyl Glycol (SYSTANE) 0.4-0.3 % SOLN Apply 2 drops to eye daily as needed. 06/09/21   Kozlow, Alvira Philips, MD  PROAIR HFA 108 [phone] Base) MCG/ACT inhaler Inhale 1-2 puffs into the lungs every 6 (six) hours as needed for wheezing. 10/14/20   Kozlow, Alvira Philips, MD  Probiotic Product (ALIGN) 4 MG CAPS Take 1 capsule by mouth daily.    [provider]  rosuvastatin (CRESTOR) 10 MG tablet Take 5 mg by mouth at bedtime. 03/28/15   [provider]      Allergies    Amoxicillin-pot clavulanate, Azithromycin, Erythromycin, Almond oil, Amoxicillin, Atorvastatin, Clavulanic acid, Codeine, Ketoconazole, Ketoconazole, Latex, Morphine, Shellfish allergy, Simvastatin, Sulfamethoxazole-trimethoprim, Clindamycin, Clindamycin/lincomycin, and Erythromycin base    Review of Systems   Review of Systems  Physical Exam Updated Vital Signs BP 127/62   Pulse 66   Temp 98 F (36.7 C) (Oral)   Resp 19   Ht 5\' 6"  (1.676 m)   Wt 68.9 kg   LMP  (LMP Unknown)   SpO2 97%   BMI 24.53 kg/m  Physical Exam Gen: Alert, pleasant older woman responding appropriately. Speaking in full sentences. NAD. Had pt move from sitting to standing, and pt reports feeling asymptomatic.  HEENT: NCAT. MMM. CV: RRR, no murmurs. Radial pulses 2+. Cap refill <2. Resp: CTAB, no crackles or wheezing. Normal WOB on RA.  Abm: Soft, nontender, nondistended. Normal BS.  Skin: Warm, well perfused.  ED Results / Procedures / Treatments   Labs (all labs ordered are listed, but only abnormal results are displayed) Labs Reviewed  BASIC METABOLIC PANEL - Abnormal; Notable for the following components:      Result Value   Potassium 3.4 (*)    BUN 24 (*)    All other components within normal limits  CBC    EKG EKG Interpretation Date/Time:  Friday January 21 2023 22:23:35  EDT Ventricular Rate:  63 PR Interval:  66 QRS Duration:  123 QT Interval:  468 QTC Calculation: 480 R Axis:   -30  Text Interpretation: Sinus rhythm Short PR interval IVCD, consider atypical RBBB No significant change since last tracing Confirmed by Gwyneth Sprout ([id number]) on 01/21/2023 10:45:23 PM  Radiology No results found.  Procedures Procedures   Medications Ordered in ED Medications - No data to display  ED Course/ Medical Decision Making/ A&P                               Medical Decision Making:   Kimberly [name] is a 82 y.o. female who presented to the ED today with hypotension detailed above.     Complete initial  physical exam performed, notably pt was able to move to standing position without issue and felt asymptomatic. Reviewed and confirmed nursing documentation for past medical history, family history, social history.    Initial Assessment:   With the patient's presentation of hypotension, most likely diagnosis is overmedication (particularly from lasix). Dehydration, hypovolemia, autonomic dysfunction, arrhythmias are considered, but less likely given history of present illness and physical exam findings.   This is most consistent with an acute complicated illness  Initial Plan:  Orthostatic vitals  Screening labs including CBC and Metabolic panel to evaluate for infectious or metabolic etiology of disease.  EKG to evaluate for cardiac pathology Objective evaluation as below reviewed   Initial Study Results:   Laboratory  All laboratory results reviewed without evidence of clinically relevant pathology.    EKG EKG was reviewed independently. Rate, rhythm, axis, intervals all examined and without medically relevant abnormality. ST segments without concerns for elevations.    Reassessment and Plan:   - Orthostatic vitals wnl. No AKI.  - Hypotension is most likely 2/2 Lasix. Lasix was started for LE edema - Advised pt to stop taking Lasix daily, and  switch to daily prn if LE edema returns. - Advised pt to f/u with PCP - Advised to f/u with Cardiologist   Final Clinical Impression(s) / ED Diagnoses Final diagnoses:  Hypotension due to drugs    Rx / DC Orders ED Discharge Orders          Ordered    furosemide (LASIX) 40 MG tablet  Daily PRN        01/21/23 2257              Lincoln Brigham, MD 01/21/23 0981    Gwyneth Sprout, MD 01/27/23 1636

## 2023-01-22 NOTE — Progress Notes (Unsigned)
Cardiology Office Note:  .   Date:  01/24/2023  ID:  Illene Labrador, DOB 05/30/1941, MRN [id number] PCP: Georgann Housekeeper, MD  [facility] Providers Cardiologist:  Armanda Magic, MD    History of Present Illness: .   Kimberly [name] is a 82 y.o. female with past medical history of PAF, chronic RBBB, OSA, asthma, HLD, chronic chest pain, hypothyroidism and CVA.   She underwent coronary CTA in 11/2018 in setting of chest pain that showed a calcium score of 0 with no evidence of CAD.  She had no echocardiogram in 05/2021 in setting of shortness of breath that showed an LVEF of 60 to 65% with hyperdynamic LV function, there were no regional wall motion abnormalities.  She was seen by Dr. Eldridge Dace in 09/02/2022 for lower extremity edema affecting a single leg, not improved with furosemide, venous ultrasound was negative for DVT.  She was noted to have an been treated for cellulitis and had missed doses of Keflex.  She was last seen by Dr. Mayford Knife on 12/13/2022 for follow-up and was reported as doing well from a cardiac standpoint.  She notified the office on 01/20/23 of low blood pressure at 90/56, she reported weakness and congestion. On follow up call on 01/21/23 she reported blood pressures of 104/63, 107/61 and other similar values.  She also reported issues with congestion and stuffy nose and feeling overall unwell.  While her recent blood pressures were not acutely concerning recommended it if she truly felt unwell and needed further evaluation recommended that she present to a freestanding ED.  At the ED her blood pressure was 127/62, orthostatic vitals were within normal limits, her workup was unremarkable although her potassium was slightly low at 3.4.  Was recommended that she stop taking Lasix daily and switch to daily as needed if lower extremity edema returns.  Today she presents for follow up, reports she is doing well. Notes some ongoing fatigue and congestion that she is seeing an  allergist for. She saw her PCP this morning who recommended decreasing her lasix to only as needed as well. She reports significant stress recently as her husband passed away in 2022/09/02 and her son had a stroke earlier this year, he is since recovering well.  ROS: Today she denies chest pain, shortness of breath, lower extremity edema, fatigue, palpitations, melena, hematuria, hemoptysis, diaphoresis, weakness, presyncope, syncope, orthopnea, and PND.   Studies Reviewed: .       Cardiac Studies & Procedures     STRESS TESTS  MYOCARDIAL PERFUSION IMAGING 07/05/2017  Narrative  Nuclear stress EF: 68%.  There was no ST segment deviation noted during stress.  The study is normal.  This is a low risk study.  The left ventricular ejection fraction is hyperdynamic (>65%).  Normal pharmacologic nuclear stress test with no evidence for prior infarct or ischemia. Normal LVEF.   ECHOCARDIOGRAM  ECHOCARDIOGRAM COMPLETE 06/16/2021  Narrative ECHOCARDIOGRAM REPORT    Patient Name:   Kimberly [name] Date of Exam: 06/16/2021 Medical Rec #:  [id number]           Height:       67.5 in Accession #:    [id number]          Weight:       172.5 lb Date of Birth:  10-07-1940          BSA:          1.910 m Patient Age:    88 years  BP:           132/70 mmHg Patient Gender: F                   HR:           63 bpm. Exam Location:  [address]  Procedure: 2D Echo, 3D Echo, Cardiac Doppler and Color Doppler  Indications:    R06.02 Shortness of Breath  History:        Patient has prior history of Echocardiogram examinations, most recent 01/28/2019. Stroke, Arrythmias:Atrial Fibrillation, Signs/Symptoms:Shortness of Breath; Risk Factors:Dyslipidemia, Family History of Coronary Artery Disease, Former Smoker and Sleep Apnea.  Sonographer:    Farrel Conners RDCS Referring Phys: HAL STONEKING  IMPRESSIONS   1. Left ventricular ejection fraction, by estimation, is 60 to 65%. Left  ventricular ejection fraction by 3D volume is 70 %. The left ventricle has normal function. The left ventricle has no regional wall motion abnormalities. Left ventricular diastolic parameters are indeterminate. 2. Right ventricular systolic function is normal. The right ventricular size is normal. There is normal pulmonary artery systolic pressure. 3. Left atrial size was mildly dilated. 4. The mitral valve is normal in structure. Mild mitral valve regurgitation. No evidence of mitral stenosis. 5. The aortic valve is normal in structure. Aortic valve regurgitation is not visualized. No aortic stenosis is present. 6. The inferior vena cava is normal in size with greater than 50% respiratory variability, suggesting right atrial pressure of 3 mmHg.  FINDINGS Left Ventricle: Conflicting data re: diastolic function. The "normal" mitral inflow pattern is unexpected at age 9, especially in view of left atrial dilation. The pulmonary vein flow is diastolic dominant, which would also support a "pseudonormal" pattern. Nevertheless, mitral annulus diastolic velocities are excellent for age. Consider relative blunting of the A wave (for example, after a recent episode of paroxysmal atrial fibrillation). Note the absence of other features that would support an echo diagnosis of constrictive physiology. Left ventricular ejection fraction, by estimation, is 60 to 65%. Left ventricular ejection fraction by 3D volume is 70 %. The left ventricle has normal function. The left ventricle has no regional wall motion abnormalities. The left ventricular internal cavity size was normal in size. There is no left ventricular hypertrophy. Left ventricular diastolic parameters are indeterminate. Indeterminate filling pressures.  Right Ventricle: The right ventricular size is normal. No increase in right ventricular wall thickness. Right ventricular systolic function is normal. There is normal pulmonary artery systolic pressure.  The tricuspid regurgitant velocity is 2.64 m/s, and with an assumed right atrial pressure of 3 mmHg, the estimated right ventricular systolic pressure is 30.9 mmHg.  Left Atrium: Left atrial size was mildly dilated.  Right Atrium: Right atrial size was normal in size.  Pericardium: There is no evidence of pericardial effusion.  Mitral Valve: The mitral valve is normal in structure. Mild mitral valve regurgitation. No evidence of mitral valve stenosis.  Tricuspid Valve: The tricuspid valve is normal in structure. Tricuspid valve regurgitation is mild . No evidence of tricuspid stenosis.  Aortic Valve: The aortic valve is normal in structure. Aortic valve regurgitation is not visualized. Aortic regurgitation PHT measures 621 msec. No aortic stenosis is present. Aortic valve mean gradient measures 4.5 mmHg. Aortic valve peak gradient measures 8.7 mmHg. Aortic valve area, by VTI measures 2.53 cm.  Pulmonic Valve: The pulmonic valve was normal in structure. Pulmonic valve regurgitation is not visualized. No evidence of pulmonic stenosis.  Aorta: The aortic root is normal in size and  structure.  Venous: The inferior vena cava is normal in size with greater than 50% respiratory variability, suggesting right atrial pressure of 3 mmHg.  IAS/Shunts: No atrial level shunt detected by color flow Doppler.   LEFT VENTRICLE PLAX 2D LVIDd:         4.60 cm   Diastology LVIDs:         2.90 cm   LV e' medial:    9.57 cm/s LV PW:         1.00 cm   LV E/e' medial:  10.7 LV IVS:        0.80 cm   LV e' lateral:   12.40 cm/s LVOT diam:     2.10 cm   LV E/e' lateral: 8.3 LV SV:         97 LV SV Index:   51 LVOT Area:     3.46 cm  3D Volume EF: 3D EF:        70 % LV EDV:       142 ml LV ESV:       42 ml LV SV:        100 ml  RIGHT VENTRICLE RV S prime:     13.20 cm/s TAPSE (M-mode): 1.4 cm  LEFT ATRIUM             Index        RIGHT ATRIUM           Index LA diam:        4.30 cm 2.25 cm/m    RA Area:     18.70 cm LA Vol (A2C):   86.8 ml 45.46 ml/m  RA Volume:   55.40 ml  29.01 ml/m LA Vol (A4C):   68.0 ml 35.61 ml/m LA Biplane Vol: 78.9 ml 41.32 ml/m AORTIC VALVE AV Area (Vmax):    2.52 cm AV Area (Vmean):   2.55 cm AV Area (VTI):     2.53 cm AV Vmax:           147.50 cm/s AV Vmean:          99.750 cm/s AV VTI:            0.382 m AV Peak Grad:      8.7 mmHg AV Mean Grad:      4.5 mmHg LVOT Vmax:         107.50 cm/s LVOT Vmean:        73.400 cm/s LVOT VTI:          0.279 m LVOT/AV VTI ratio: 0.73 AI PHT:            621 msec  AORTA Ao Root diam: 3.70 cm Ao Asc diam:  3.45 cm  MITRAL VALVE                TRICUSPID VALVE MV Area (PHT): cm          TR Peak grad:   27.9 mmHg MV Decel Time: 227 msec     TR Vmax:        264.00 cm/s MV E velocity: 102.50 cm/s MV A velocity: 79.65 cm/s   SHUNTS MV E/A ratio:  1.29         Systemic VTI:  0.28 m Systemic Diam: 2.10 cm  Rachelle Hora Croitoru MD Electronically signed by Thurmon Fair MD Signature Date/Time: 06/16/2021/1:47:34 PM    Final    MONITORS  CARDIAC EVENT MONITOR 06/27/2017  Narrative  Normal sinus rhythm - average heart rate 64.7bpm.  The heart rate ranged from 40-136bpm.  Paroxysmal atrial fibrillation with RVR up to 136bpm.   CT SCANS  CT CORONARY MORPH W/CTA COR W/SCORE 12/27/2018  Addendum 12/27/2018  5:00 PM ADDENDUM REPORT: 12/27/2018 16:39  EXAM: OVER-READ INTERPRETATION  CT CHEST  The following report is an over-read performed by radiologist Dr. Leatha Gilding [facility] on 12/27/2018. This over-read does not include interpretation of cardiac or coronary anatomy or pathology. The coronary CTA interpretation by the cardiologist is attached.  COMPARISON:  None.  FINDINGS: Mediastinum: No evidence for lymphadenopathy within the visualized hilar regions or mediastinum. The esophagus has normal imaging features.  Visualized lung parenchyma shows no suspicious pulmonary  nodule or mass. Areas of branching opacity in the posterior lower lobes bilaterally suggest impacted airways and may reflect atypical infection. In the appropriate clinical setting, aspiration would be a consideration.  Visualized portions of the upper abdomen are unremarkable.  No worrisome lytic or sclerotic osseous abnormality.  IMPRESSION: No acute or clinically significant extracardiac findings within the visualized portion of the chest.   Electronically Signed By: Kennith Center M.D. On: 12/27/2018 16:39  Narrative CLINICAL DATA:  Chest pain  EXAM: Cardiac/Coronary  CT  TECHNIQUE: The patient was scanned on a [employer].  FINDINGS: A 120 kV prospective scan was triggered in the descending thoracic aorta at 111 HU's. Axial non-contrast 3 mm slices were carried out through the heart. The data set was analyzed on a dedicated work station and scored using the Agatson method. Gantry rotation speed was 250 msecs and collimation was .6 mm. No beta blockade and 0.8 mg of sl NTG was given. The 3D data set was reconstructed in 5% intervals of the 67-82 % of the R-R cycle. Diastolic phases were analyzed on a dedicated work station using MPR, MIP and VRT modes. The patient received 80 cc of contrast.  Aorta:  Normal size.  No calcifications.  No dissection.  Aortic Valve:  Trileaflet.  Mildly calcified aortic annulus.  Coronary Arteries:  Normal coronary origin.  Right dominance.  RCA is a small non-dominant artery.  There is no plaque.  Left main is a large artery that gives rise to LAD and LCX arteries. There is no plaque.  LAD is a moderate sized vessel that does not traverse to the apex. It gives rise to a small D1 and moderate sized D2 and D3 vessels. There is no plaque.  LCX is a large dominant artery that gives rise to 3 moderate sized OM branches and LPDA and LPLA branches. There is no plaque.  Other findings:  Normal pulmonary vein drainage  into the left atrium.  Normal let atrial appendage without a thrombus.  Normal size of the pulmonary artery.  IMPRESSION: 1. Coronary calcium score of 0. This was 0 percentile for age and sex matched control.  2. Normal coronary origin with left dominance.  3. No evidence of CAD.  4. There is mild calcification of the aortic annulus.  Gloris Manchester Turner  Electronically Signed: By: Armanda Magic On: 12/27/2018 15:22           Risk Assessment/Calculations:    CHA2DS2-VASc Score = 6   This indicates a 9.7% annual risk of stroke. The patient's score is based upon: CHF History: 0 HTN History: 0 Diabetes History: 0 Stroke History: 2 Vascular Disease History: 1 Age Score: 2 Gender Score: 1            Physical Exam:   VS:  BP 110/66  Pulse 72   Ht 5\' 6"  (1.676 m)   Wt 155 lb 12.8 oz (70.7 kg)   LMP  (LMP Unknown)   SpO2 97%   BMI 25.15 kg/m    Wt Readings from Last 3 Encounters:  01/24/23 155 lb 12.8 oz (70.7 kg)  01/21/23 152 lb (68.9 kg)  01/20/23 152 lb 8 oz (69.2 kg)    GEN: Well nourished, well developed in no acute distress NECK: No JVD; No carotid bruits CARDIAC: RRR, no murmurs, rubs, gallops RESPIRATORY:  Clear to auscultation without rales, wheezing or rhonchi  ABDOMEN: Soft, non-tender, non-distended EXTREMITIES:  No edema; No deformity   ASSESSMENT AND PLAN: .    Hypotension/mild hypokalemia: She notified the office on 01/20/23 of low blood pressure at 90/56. On follow up call on 01/21/23 she reported blood pressures of 104/63, 107/61 and other similar values.  ED workup was unremarkable, it was recommended that she switch her daily Lasix to as needed for lower extremity edema. Blood pressure today 104/58, on recheck was 110/66. Discussed importance of eating well and staying well hydrated. Will reduce lasix 20mg  daily to only as needed for lower extremity edema and to take potassium only when taking Lasix. Will recheck BMET today as potassium was low in  the ED, she reports not having taken for some time.    PAF: Reviewed EKG from ED on 01/21/23 shows NSR with RBBB. She denies palpitations or feeling of fast heart rate. Continue Eliquis 5 mg twice daily and Cardizem CD 240 mg daily   OSA: Currently unable to wear CPAP currently due to congestion. Plans to restart.   Chronic lower extremity edema: Echocardiogram in 05/2021 showed an LVEF of 60 to 65% with hyperdynamic LV function, there were no regional wall motion abnormalities.  Noted to have cellulitis previulsy that worsened edema. Likely component of venous insufficiency. Continue Lasix 20 mg only as needed for lower extremity edema.   Noncardiac CP: She has known history of chronic chest pain associated with stress.  She had a normal coronary CTA in 2020. She denies chest pain today.   Hyperlipidemia: Last lipid profile on 11/10/22 indicated LDL of 64 and total cholesterol of 144. Continue rosuvastatin.        Dispo: Follow up with Dr. Mayford Knife in 11 months.   Signed, Rip Harbour, NP

## 2023-01-24 ENCOUNTER — Ambulatory Visit: Payer: Medicare PPO | Attending: Physician Assistant | Admitting: Cardiology

## 2023-01-24 ENCOUNTER — Encounter: Payer: Self-pay | Admitting: Cardiology

## 2023-01-24 VITALS — BP 110/66 | HR 72 | Ht 66.0 in | Wt 155.8 lb

## 2023-01-24 DIAGNOSIS — Z7901 Long term (current) use of anticoagulants: Secondary | ICD-10-CM

## 2023-01-24 DIAGNOSIS — G4733 Obstructive sleep apnea (adult) (pediatric): Secondary | ICD-10-CM

## 2023-01-24 DIAGNOSIS — E785 Hyperlipidemia, unspecified: Secondary | ICD-10-CM | POA: Diagnosis not present

## 2023-01-24 DIAGNOSIS — I480 Paroxysmal atrial fibrillation: Secondary | ICD-10-CM | POA: Diagnosis not present

## 2023-01-24 DIAGNOSIS — M7989 Other specified soft tissue disorders: Secondary | ICD-10-CM | POA: Diagnosis not present

## 2023-01-24 DIAGNOSIS — I959 Hypotension, unspecified: Secondary | ICD-10-CM | POA: Diagnosis not present

## 2023-01-24 DIAGNOSIS — R6883 Chills (without fever): Secondary | ICD-10-CM | POA: Diagnosis not present

## 2023-01-24 NOTE — Patient Instructions (Addendum)
Medication Instructions:  Your physician has recommended you make the following change in your medication:  1.  CHANGE the Lasix to ONLY AS NEEDED 2.  CHANGE the Potassium to ONLY AS NEEDED WHEN YOU HAVE TO TAKE A LASIX  *If you need a refill on your cardiac medications before your next appointment, please call your pharmacy*   Lab Work: TODAY:  BMET  If you have labs (blood work) drawn today and your tests are completely normal, you will receive your results only by: MyChart Message (if you have MyChart) OR A paper copy in the mail If you have any lab test that is abnormal or we need to change your treatment, we will call you to review the results.   Testing/Procedures: None ordered   Follow-Up: At [facility], you and your health needs are our priority.  As part of our continuing mission to provide you with exceptional heart care, we have created designated Provider Care Teams.  These Care Teams include your primary Cardiologist (physician) and Advanced Practice Providers (APPs -  Physician Assistants and Nurse Practitioners) who all work together to provide you with the care you need, when you need it.  We recommend signing up for the patient portal called "MyChart".  Sign up information is provided on this After Visit Summary.  MyChart is used to connect with patients for Virtual Visits (Telemedicine).  Patients are able to view lab/test results, encounter notes, upcoming appointments, etc.  Non-urgent messages can be sent to your provider as well.   To learn more about what you can do with MyChart, go to [email or web address].    Your next appointment:   12 month(s)  Provider:   Armanda Magic, MD     Other Instructions

## 2023-01-24 NOTE — Telephone Encounter (Signed)
See above phone notes.

## 2023-01-24 NOTE — Telephone Encounter (Signed)
See above notes. Patient spoke with on-call over the weekend and was also seen in the ED. She is seeing Tereso Newcomer, PA in our office this afternoon.

## 2023-01-25 ENCOUNTER — Telehealth: Payer: Self-pay | Admitting: Cardiology

## 2023-01-25 LAB — BASIC METABOLIC PANEL
BUN/Creatinine Ratio: 27 (ref 12–28)
BUN: 21 mg/dL (ref 8–27)
CO2: 25 mmol/L (ref 20–29)
Calcium: 9.3 mg/dL (ref 8.7–10.3)
Chloride: 103 mmol/L (ref 96–106)
Creatinine, Ser: 0.77 mg/dL (ref 0.57–1.00)
Glucose: 87 mg/dL (ref 70–99)
Potassium: 3.7 mmol/L (ref 3.5–5.2)
Sodium: 142 mmol/L (ref 134–144)
eGFR: 77 mL/min/{1.73_m2} (ref >59)

## 2023-01-25 NOTE — Telephone Encounter (Signed)
Patient is aware of lab results. She verbalized ubdrstanding

## 2023-01-25 NOTE — Telephone Encounter (Signed)
Patient states she is returning call. May be regarding lab results. She states she is about to take a shower and would like a call back in an hour if possible.

## 2023-02-14 ENCOUNTER — Other Ambulatory Visit: Payer: Self-pay | Admitting: Internal Medicine

## 2023-02-14 DIAGNOSIS — Z1231 Encounter for screening mammogram for malignant neoplasm of breast: Secondary | ICD-10-CM

## 2023-02-21 ENCOUNTER — Other Ambulatory Visit: Payer: Medicare PPO

## 2023-03-03 ENCOUNTER — Telehealth: Payer: Self-pay

## 2023-03-03 MED ORDER — FLUTICASONE PROPIONATE 50 MCG/ACT NA SUSP: 2.0000 | Freq: Two times a day (BID) | NASAL | 0 refills | Status: DC | PRN

## 2023-03-03 NOTE — Telephone Encounter (Signed)
Patient called stating she needed Fluticasone nasal spray called in to the pharmacy. I sent refill into [facility] in [place] on [address].  Glessie [phone]

## 2023-03-08 ENCOUNTER — Other Ambulatory Visit: Payer: Self-pay | Admitting: Cardiology

## 2023-03-08 DIAGNOSIS — I480 Paroxysmal atrial fibrillation: Secondary | ICD-10-CM

## 2023-03-08 NOTE — Telephone Encounter (Signed)
Prescription refill request for Eliquis received. Indication: PAF Last office visit: 01/24/23  Lindwood Qua NP Scr: 0.77 on 01/24/23  Epic Age: 82 Weight: 70.7kg  Based on above findings Eliquis 5mg  twice daily is the appropriate dose.  Refill approved.

## 2023-03-10 DIAGNOSIS — E785 Hyperlipidemia, unspecified: Secondary | ICD-10-CM | POA: Diagnosis not present

## 2023-03-10 DIAGNOSIS — I480 Paroxysmal atrial fibrillation: Secondary | ICD-10-CM | POA: Diagnosis not present

## 2023-03-10 DIAGNOSIS — I700 Atherosclerosis of aorta: Secondary | ICD-10-CM | POA: Diagnosis not present

## 2023-03-10 DIAGNOSIS — F411 Generalized anxiety disorder: Secondary | ICD-10-CM | POA: Diagnosis not present

## 2023-03-10 DIAGNOSIS — Z23 Encounter for immunization: Secondary | ICD-10-CM | POA: Diagnosis not present

## 2023-03-10 DIAGNOSIS — D6869 Other thrombophilia: Secondary | ICD-10-CM | POA: Diagnosis not present

## 2023-03-10 DIAGNOSIS — I872 Venous insufficiency (chronic) (peripheral): Secondary | ICD-10-CM | POA: Diagnosis not present

## 2023-03-10 DIAGNOSIS — E039 Hypothyroidism, unspecified: Secondary | ICD-10-CM | POA: Diagnosis not present

## 2023-03-10 DIAGNOSIS — J309 Allergic rhinitis, unspecified: Secondary | ICD-10-CM | POA: Diagnosis not present

## 2023-03-15 ENCOUNTER — Telehealth: Payer: Self-pay | Admitting: *Deleted

## 2023-03-15 NOTE — Telephone Encounter (Signed)
Patient called to say she wants to get back on sleep therapy after being off of it for a few months. Patient was encouraged to call compliance at her dme and call us back if she needs more help.

## 2023-03-16 DIAGNOSIS — G4733 Obstructive sleep apnea (adult) (pediatric): Secondary | ICD-10-CM | POA: Diagnosis not present

## 2023-03-20 ENCOUNTER — Other Ambulatory Visit: Payer: Self-pay | Admitting: Allergy

## 2023-03-23 ENCOUNTER — Ambulatory Visit: Payer: Medicare PPO

## 2023-03-24 ENCOUNTER — Ambulatory Visit: Payer: Medicare PPO | Admitting: Allergy

## 2023-03-28 NOTE — Progress Notes (Unsigned)
Follow Up Note  RE: Vannida Cluster MRN: [id number] DOB: 1940-12-14 Date of Office Visit: 03/29/2023  Referring provider: Georgann Housekeeper, MD Primary care provider: Georgann Housekeeper, MD  Chief Complaint: No chief complaint on file.  History of Present Illness: I had the pleasure of seeing Mayreli Pfuhl for a follow up visit at the [facility] on 03/28/2023. She is a 82 y.o. female, who is being followed for allergic rhinitis, adverse food reaction, h/o asthma. Her previous allergy office visit was on 01/20/2023 with Dr. Selena Batten. Today is a regular follow up visit.  Discussed the use of AI scribe software for clinical note transcription with the patient, who gave verbal consent to proceed.  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 Used to be on AIT at [facility] but stopped.  Interim history - stopped Singulair and Claritin. Having nasal congestion and just finished antibiotics.  Start Singulair (montelukast) 10mg  daily at night. Use Flonase (fluticasone) nasal spray 1-2 sprays per nostril once a day as needed for nasal congestion.  Use azelastine nasal spray 1-2 sprays per nostril twice a day as needed for runny nose/drainage. Demonstrated proper nasal spray use.  Nasal saline spray (i.e., Simply Saline) or nasal saline lavage (i.e., NeilMed) is recommended as needed and prior to medicated nasal sprays. Hold off antihistamines due to drowsiness and patient lives alone.    Adverse food reaction, subsequent encounter Past history - 2022 bloodwork borderline positive to shellfish. Interim history - no reactions. Continue to avoid shellfish. For mild symptoms you can take over the counter antihistamines such as Benadryl 1-2 tablets = 25-50mg  and monitor symptoms closely.  If symptoms worsen or if you have severe symptoms including breathing issues, throat closure, significant  swelling, whole body hives, severe diarrhea and vomiting, lightheadedness then seek immediate medical care. Retest once feeling better.    History of asthma Denies symptoms and no inhaler use.  Monitor symptoms.   Low blood pressure reading Follows with cards for afib and is on several meds for this. She's been feeling tired lately. BP in the office was 90/56 and repeat was 98/56. Advised patient to reach out to PCP and/or cards regarding medication adjustments.     Assessment and Plan: Saylor is a 82 y.o. female with: Allergic rhinitis due to dust mite Past history - 2022 blood work was positive dust mites. Used to be on AIT at [facility] but stopped.  Interim history - stopped Singulair and Claritin. Having nasal congestion and just finished antibiotics.  Start Singulair (montelukast) 10mg  daily at night. Use Flonase (fluticasone) nasal spray 1-2 sprays per nostril once a day as needed for nasal congestion.  Use azelastine nasal spray 1-2 sprays per nostril twice a day as needed for runny nose/drainage. Demonstrated proper nasal spray use.  Nasal saline spray (i.e., Simply Saline) or nasal saline lavage (i.e., NeilMed) is recommended as needed and prior to medicated nasal sprays. Hold off antihistamines due to drowsiness and patient lives alone.    Adverse food reaction, subsequent encounter Past history - 2022 bloodwork borderline positive to shellfish. Interim history - no reactions. Continue to avoid shellfish. For mild symptoms you can take over the counter antihistamines such as Benadryl 1-2 tablets = 25-50mg  and monitor symptoms closely.  If symptoms worsen or if you have severe symptoms including breathing issues, throat closure, significant swelling, whole  body hives, severe diarrhea and vomiting, lightheadedness then seek immediate medical care. Retest once feeling better.    History of asthma Denies symptoms and no inhaler use.  Monitor symptoms.   Assessment and Plan               No follow-ups on file.  No orders of the defined types were placed in this encounter.  Lab Orders  No laboratory test(s) ordered today    Diagnostics: Spirometry:  Tracings reviewed. Her effort: {Blank single:19197::"Good reproducible efforts.","It was hard to get consistent efforts and there is a question as to whether this reflects a maximal maneuver.","Poor effort, data can not be interpreted."} FVC: ***L FEV1: ***L, ***% predicted FEV1/FVC ratio: ***% Interpretation: {Blank single:19197::"Spirometry consistent with mild obstructive disease","Spirometry consistent with moderate obstructive disease","Spirometry consistent with severe obstructive disease","Spirometry consistent with possible restrictive disease","Spirometry consistent with mixed obstructive and restrictive disease","Spirometry uninterpretable due to technique","Spirometry consistent with normal pattern","No overt abnormalities noted given today's efforts"}.  Please see scanned spirometry results for details.  Skin Testing: {Blank single:19197::"Select foods","Environmental allergy panel","Environmental allergy panel and select foods","Food allergy panel","None","Deferred due to recent antihistamines use"}. *** Results discussed with patient/family.   Medication List:  Current Outpatient Medications  Medication Sig Dispense Refill  . acetaminophen (TYLENOL) 500 MG tablet Take 500 mg by mouth every 6 (six) hours as needed for moderate pain.    Marland Kitchen albuterol (VENTOLIN HFA) 108 (90 Base) MCG/ACT inhaler Inhale 2 puffs into the lungs every 6 (six) hours as needed for wheezing or shortness of breath. 18 g 3  . azelastine (ASTELIN) 0.1 % nasal spray Place 2 sprays into both nostrils 2 (two) times daily as needed for rhinitis. Use in each nostril as directed 30 mL 5  . CALTRATE 600+D3 SOFT 600-20 MG-MCG CHEW daily at 6 (six) AM.    . Cholecalciferol 100 MCG (4000 UT) TABS Take by mouth.    . cycloSPORINE  (RESTASIS) 0.05 % ophthalmic emulsion Place 1 drop into both eyes 2 (two) times daily.    Marland Kitchen diltiazem (CARDIZEM CD) 240 MG 24 hr capsule Take 1 capsule (240 mg total) by mouth daily. 90 capsule 3  . ELIQUIS 5 MG TABS tablet TAKE 1 TABLET BY MOUTH TWICE A DAY 180 tablet 1  . EPINEPHrine 0.3 mg/0.3 mL IJ SOAJ injection Inject 0.3 mg into the muscle as needed (for allergic reaction). Reported on 05/28/2015 2 each 2  . fluticasone (FLONASE) 50 MCG/ACT nasal spray PLACE 2 SPRAYS INTO BOTH NOSTRILS 2 (TWO) TIMES DAILY AS NEEDED FOR ALLERGIES. *DUE FOR OFFICE VISIT 16 mL 1  . furosemide (LASIX) 40 MG tablet Take 0.5 tablets (20 mg total) by mouth daily as needed for edema.    Marland Kitchen KLOR-CON M10 10 MEQ tablet Take 10 mEq by mouth as needed (TAKE ONLY WHEN TAKE A LASIX).    Marland Kitchen levothyroxine (SYNTHROID) 100 MCG tablet Take 100 mcg by mouth every morning.    . meclizine (ANTIVERT) 12.5 MG tablet Take 2 tablets by mouth as directed.    Marland Kitchen MIRALAX 17 GM/SCOOP powder Take 17 g by mouth as needed.    . montelukast (SINGULAIR) 10 MG tablet Take 1 tablet (10 mg total) by mouth at bedtime. 30 tablet 3  . omeprazole (PRILOSEC) 20 MG capsule Take 20 mg by mouth as needed (indigestion).    . ondansetron (ZOFRAN ODT) 4 MG disintegrating tablet Take 1 tablet (4 mg total) by mouth every 6 (six) hours as needed. 20 tablet 0  . Polyethyl Glycol-Propyl  Glycol (SYSTANE) 0.4-0.3 % SOLN Apply 2 drops to eye daily as needed. 30 mL 5  . PROAIR HFA 108 (90 Base) MCG/ACT inhaler Inhale 1-2 puffs into the lungs every 6 (six) hours as needed for wheezing. 18 g 3  . Probiotic Product (ALIGN) 4 MG CAPS Take 1 capsule by mouth daily.    . rosuvastatin (CRESTOR) 10 MG tablet Take 5 mg by mouth at bedtime.  4   No current facility-administered medications for this visit.   Allergies: Allergies  Allergen Reactions  . Amoxicillin-Pot Clavulanate Nausea Only, Other (See Comments) and Swelling    Has patient had a PCN reaction causing  immediate rash, facial/tongue/throat swelling, SOB or lightheadedness with hypotension: No  Has patient had a PCN reaction causing severe rash involving mucus membranes or skin necrosis: No  Has patient had a PCN reaction that required hospitalization No  Has patient had a PCN reaction occurring within the last 10 years: No  If all of the above answers are "NO", then may proceed with Cephalosporin use.  Other reaction(s): nausea  Other Reaction(s): GI Intolerance  . Azithromycin Other (See Comments)    Pancreatitis  Other reaction(s): Other, pancreatitis  Other Reaction(s): Other (See Comments)  pancreatitis  . Erythromycin Hives and Other (See Comments)    Reaction 1 time, when she was younger.  Other reaction(s): hives  . Almond Oil Other (See Comments)    Almonds: per allergy testing.  . Amoxicillin Nausea Only  . Atorvastatin Other (See Comments)    Muscle weakness  Muscle weakness  Other reaction(s): muscle weakness, Other  . Clavulanic Acid Nausea Only    Other Reaction(s): GI Intolerance  . Codeine Nausea Only and Other (See Comments)    Other reaction(s): Nausea/Vomiting  Other Reaction(s): GI Intolerance  . Ketoconazole Nausea And Vomiting  . Ketoconazole Nausea And Vomiting, Nausea Only and Other (See Comments)    Other reaction(s): Unknown  Other Reaction(s): GI Intolerance  . Latex Other (See Comments)    Other  Other Reaction(s): Other (See Comments)  Other,  . Morphine Nausea Only    Other reaction(s): Unknown  Other Reaction(s): GI Intolerance  . Shellfish Allergy Other (See Comments)    Shellfish mix: per allergy testing.  . Simvastatin Other (See Comments)    Muscle pain Other reaction(s): muscle pain, Other  . Sulfamethoxazole-Trimethoprim Nausea Only    Upset stomach  Other Reaction(s): GI Intolerance  . Clindamycin Rash  . Clindamycin/Lincomycin Nausea And Vomiting and Rash  . Erythromycin Base Rash   I reviewed her past medical  history, social history, family history, and environmental history and no significant changes have been reported from her previous visit.  Review of Systems  Constitutional:  Positive for chills. Negative for appetite change, fever and unexpected weight change.  HENT:  Positive for congestion and postnasal drip. Negative for rhinorrhea.   Eyes:  Negative for itching.  Respiratory:  Negative for cough, chest tightness, shortness of breath and wheezing.   Cardiovascular:  Negative for chest pain.  Gastrointestinal:  Negative for abdominal pain.  Genitourinary:  Negative for difficulty urinating.  Skin:  Negative for rash.  Allergic/Immunologic: Positive for environmental allergies and food allergies.  Neurological:  Negative for headaches.   Objective: LMP  (LMP Unknown)  There is no height or weight on file to calculate BMI. Physical Exam Vitals and nursing note reviewed.  Constitutional:      Appearance: Normal appearance. She is well-developed.  HENT:     Head: Normocephalic and  atraumatic.     Right Ear: Tympanic membrane and external ear normal.     Left Ear: Tympanic membrane and external ear normal.     Nose: Nose normal.     Mouth/Throat:     Mouth: Mucous membranes are moist.     Pharynx: Oropharynx is clear.  Eyes:     Conjunctiva/sclera: Conjunctivae normal.  Cardiovascular:     Rate and Rhythm: Normal rate and regular rhythm.     Heart sounds: Normal heart sounds. No murmur heard.    No friction rub. No gallop.  Pulmonary:     Effort: Pulmonary effort is normal.     Breath sounds: Normal breath sounds. No wheezing, rhonchi or rales.  Musculoskeletal:     Cervical back: Neck supple.  Skin:    General: Skin is warm.     Findings: No rash.  Neurological:     Mental Status: She is alert and oriented to person, place, and time.  Psychiatric:        Behavior: Behavior normal.  Previous notes and tests were reviewed. The plan was reviewed with the patient/family,  and all questions/concerned were addressed.  It was my pleasure to see Rhapsody today and participate in her care. Please feel free to contact me with any questions or concerns.  Sincerely,  Wyline Mood, DO Allergy & Immunology  [facility] office: [phone] [facility] office: [phone]

## 2023-03-29 ENCOUNTER — Ambulatory Visit: Payer: Medicare PPO | Admitting: Allergy

## 2023-03-29 ENCOUNTER — Telehealth: Payer: Self-pay | Admitting: Allergy

## 2023-03-29 ENCOUNTER — Encounter: Payer: Self-pay | Admitting: Allergy

## 2023-03-29 VITALS — BP 110/72 | HR 62 | Temp 97.6°F | Resp 14

## 2023-03-29 DIAGNOSIS — T781XXD Other adverse food reactions, not elsewhere classified, subsequent encounter: Secondary | ICD-10-CM

## 2023-03-29 DIAGNOSIS — J3089 Other allergic rhinitis: Secondary | ICD-10-CM | POA: Diagnosis not present

## 2023-03-29 DIAGNOSIS — Z8709 Personal history of other diseases of the respiratory system: Secondary | ICD-10-CM

## 2023-03-29 MED ORDER — MONTELUKAST SODIUM 10 MG PO TABS: 10.0000 mg | ORAL_TABLET | Freq: Every day | ORAL | 3 refills | Status: DC

## 2023-03-29 MED ORDER — EPINEPHRINE 0.3 MG/0.3ML IJ SOAJ: 0.3000 mg | INTRAMUSCULAR | 1 refills | Status: AC | PRN

## 2023-03-29 NOTE — Telephone Encounter (Signed)
Patient called asking for a call back.  She is locked out of her computer and wanted to make sure that mychart instructions were the same as the AVS printed out from today. Reassured her it was and that in [place] she can see my full documentation but that will not change the patient instructions.

## 2023-03-29 NOTE — Patient Instructions (Addendum)
Sinuses  2022 bloodwork positive to dust mites. Continue environmental control measures.  Restart Singulair (montelukast) 10mg  daily at night. Use Flonase (fluticasone) nasal spray 1-2 sprays per nostril once a day as needed for nasal congestion.  Use azelastine nasal spray 1-2 sprays per nostril twice a day as needed for runny nose/drainage. Nasal saline spray (i.e., Simply Saline) or nasal saline lavage (i.e., NeilMed) is recommended as needed and prior to medicated nasal sprays. May take Mucinex twice a day as needed to loosen up the mucous - take with plenty of water.   Food allergy Continue to avoid shellfish. For mild symptoms you can take over the counter antihistamines such as Benadryl 1-2 tablets = 25-50mg  and monitor symptoms closely. If symptoms worsen or if you have severe symptoms including breathing issues, throat closure, significant swelling, whole body hives, severe diarrhea and vomiting, lightheadedness then inject epinephrine and seek immediate medical care afterwards. Emergency action plan in place.  History of asthma Monitor symptoms.  Follow up in 3 months or sooner if needed.   Buffered Isotonic Saline Irrigations:  Goal: When you irrigate with the isotonic saline (salt water) it washes mucous and other debris from your nose that could be contributing to your nasal symptoms.   Recipe: Obtain 1 quart jar that is clean Fill with clean (bottled, boiled or distilled) water Add 1-2 heaping teaspoons of salt without iodine If the solution with 2 teaspoons of salt is too strong, adjust the amount down until better tolerated Add 1 teaspoon of Arm [employer] Hammer baking soda (pure bicarbonate) Mix ingredients together and store at room temperature and discard after 1 week * Alternatively you can buy pre made salt packets for the NeilMed bottle or there          are other over the counter brands available  Instructions: Warm  cup of the solution in the microwave if desired  but be careful not to overheat as this will burn the inside of your nose Stand over a sink (or do it while you shower) and squirt the solution into one side of your nose aiming towards the back of your head Sometimes saying "coca cola" while irrigating can be helpful to prevent fluid from going down your throat  The solution will travel to the back of your nose and then come out the other side Perform this again on the other side Try to do this twice a day If you are using a nasal spray in addition to the irrigation, irrigate first and then use the topical nasal spray otherwise you will wash the nasal spray out of your nose  Control of House Dust Mite Allergen Dust mite allergens are a common trigger of allergy and asthma symptoms. While they can be found throughout the house, these microscopic creatures thrive in warm, humid environments such as bedding, upholstered furniture and carpeting. Because so much time is spent in the bedroom, it is essential to reduce mite levels there.  Encase pillows, mattresses, and box springs in special allergen-proof fabric covers or airtight, zippered plastic covers.  Bedding should be washed weekly in hot water (130 F) and dried in a hot dryer. Allergen-proof covers are available for comforters and pillows that can't be regularly washed.  Wash the allergy-proof covers every few months. Minimize clutter in the bedroom. Keep pets out of the bedroom.  Keep humidity less than 50% by using a dehumidifier or air conditioning. You can buy a humidity measuring device called a hygrometer to monitor this.  If possible, replace carpets with hardwood, linoleum, or washable area rugs. If that's not possible, vacuum frequently with a vacuum that has a HEPA filter. Remove all upholstered furniture and non-washable window drapes from the bedroom. Remove all non-washable stuffed toys from the bedroom.  Wash stuffed toys weekly.

## 2023-03-31 ENCOUNTER — Ambulatory Visit: Payer: Medicare PPO | Admitting: Allergy

## 2023-04-19 ENCOUNTER — Ambulatory Visit
Admission: RE | Admit: 2023-04-19 | Discharge: 2023-04-19 | Disposition: A | Discharge status: Home / Self Care | Payer: Medicare PPO | Source: Ambulatory Visit | Attending: Internal Medicine | Admitting: Internal Medicine

## 2023-04-19 DIAGNOSIS — Z1231 Encounter for screening mammogram for malignant neoplasm of breast: Secondary | ICD-10-CM

## 2023-05-11 DIAGNOSIS — H31002 Unspecified chorioretinal scars, left eye: Secondary | ICD-10-CM | POA: Diagnosis not present

## 2023-05-11 DIAGNOSIS — H33301 Unspecified retinal break, right eye: Secondary | ICD-10-CM | POA: Diagnosis not present

## 2023-05-11 DIAGNOSIS — H52203 Unspecified astigmatism, bilateral: Secondary | ICD-10-CM | POA: Diagnosis not present

## 2023-05-11 DIAGNOSIS — H43813 Vitreous degeneration, bilateral: Secondary | ICD-10-CM | POA: Diagnosis not present

## 2023-05-11 DIAGNOSIS — H04123 Dry eye syndrome of bilateral lacrimal glands: Secondary | ICD-10-CM | POA: Diagnosis not present

## 2023-05-11 DIAGNOSIS — D23121 Other benign neoplasm of skin of left upper eyelid, including canthus: Secondary | ICD-10-CM | POA: Diagnosis not present

## 2023-05-16 DIAGNOSIS — D23121 Other benign neoplasm of skin of left upper eyelid, including canthus: Secondary | ICD-10-CM | POA: Diagnosis not present

## 2023-05-16 DIAGNOSIS — H5310 Unspecified subjective visual disturbances: Secondary | ICD-10-CM | POA: Diagnosis not present

## 2023-05-16 DIAGNOSIS — H31003 Unspecified chorioretinal scars, bilateral: Secondary | ICD-10-CM | POA: Diagnosis not present

## 2023-05-18 ENCOUNTER — Ambulatory Visit: Payer: Medicare PPO | Admitting: Podiatry

## 2023-05-19 DIAGNOSIS — N8111 Cystocele, midline: Secondary | ICD-10-CM | POA: Diagnosis not present

## 2023-05-19 DIAGNOSIS — N8110 Cystocele, unspecified: Secondary | ICD-10-CM | POA: Diagnosis not present

## 2023-05-19 DIAGNOSIS — N362 Urethral caruncle: Secondary | ICD-10-CM | POA: Diagnosis not present

## 2023-05-19 DIAGNOSIS — R3129 Other microscopic hematuria: Secondary | ICD-10-CM | POA: Diagnosis not present

## 2023-06-09 DIAGNOSIS — E039 Hypothyroidism, unspecified: Secondary | ICD-10-CM | POA: Diagnosis not present

## 2023-06-09 DIAGNOSIS — E538 Deficiency of other specified B group vitamins: Secondary | ICD-10-CM | POA: Diagnosis not present

## 2023-06-09 DIAGNOSIS — R202 Paresthesia of skin: Secondary | ICD-10-CM | POA: Diagnosis not present

## 2023-06-29 DIAGNOSIS — M2010 Hallux valgus (acquired), unspecified foot: Secondary | ICD-10-CM | POA: Diagnosis not present

## 2023-06-29 DIAGNOSIS — L089 Local infection of the skin and subcutaneous tissue, unspecified: Secondary | ICD-10-CM | POA: Diagnosis not present

## 2023-06-29 DIAGNOSIS — E538 Deficiency of other specified B group vitamins: Secondary | ICD-10-CM | POA: Diagnosis not present

## 2023-07-05 ENCOUNTER — Encounter: Payer: Self-pay | Admitting: Cardiology

## 2023-07-05 ENCOUNTER — Ambulatory Visit: Payer: Medicare PPO | Admitting: Podiatry

## 2023-07-05 NOTE — Telephone Encounter (Signed)
 Error

## 2023-07-06 DIAGNOSIS — D519 Vitamin B12 deficiency anemia, unspecified: Secondary | ICD-10-CM | POA: Diagnosis not present

## 2023-07-11 ENCOUNTER — Ambulatory Visit: Payer: Medicare PPO | Admitting: Podiatry

## 2023-07-11 DIAGNOSIS — M79674 Pain in right toe(s): Secondary | ICD-10-CM | POA: Diagnosis not present

## 2023-07-11 DIAGNOSIS — M79675 Pain in left toe(s): Secondary | ICD-10-CM | POA: Diagnosis not present

## 2023-07-11 DIAGNOSIS — L603 Nail dystrophy: Secondary | ICD-10-CM | POA: Diagnosis not present

## 2023-07-11 DIAGNOSIS — Z7901 Long term (current) use of anticoagulants: Secondary | ICD-10-CM

## 2023-07-11 DIAGNOSIS — L84 Corns and callosities: Secondary | ICD-10-CM

## 2023-07-11 DIAGNOSIS — B351 Tinea unguium: Secondary | ICD-10-CM

## 2023-07-11 NOTE — Addendum Note (Signed)
 Addended by: Jaiya Mooradian R on: 07/11/2023 11:18 AM   Modules accepted: Orders

## 2023-07-11 NOTE — Progress Notes (Signed)
 Subjective:  Patient ID: Kimberly [name], female    DOB: [birth year],   MRN: [id number]  No chief complaint on file.   83 y.o. female presents for concern of thickened elongated and painful nails that are difficult to trim. Requesting to have them trimmed today.  Relate some concern for fungus today. She also relates a corn on the inside of her great toe on the right. She has been on antibiotics. She is on chronic anticoagulation and at risk for foot care  PCP:  Husain, Karrar, MD    . Denies any other pedal complaints. Denies n/v/f/c.   Past Medical History:  Diagnosis Date   Allergy    Anal polyp 1998   Flex Sig    Anemia    Angular blepharitis of left eye    Ankle fracture    Stress fracture   Anxiety    Aortic atherosclerosis (HCC)    Asthma    border line has inhaler   Bunion    Cataracts, bilateral    Corneal scar    left eye   CPAP (continuous positive airway pressure) dependence    Degenerative disc disease    Depression    Diverticulosis of colon (without mention of hemorrhage) 2010   Colonoscopy   Dry eyes    bilateral   Enterocele    External hemorrhoids 2000   Colonoscopy   Family history of malignant neoplasm of gastrointestinal tract    Female cystocele    Fibromyalgia    GERD (gastroesophageal reflux disease)    Hiatal hernia 2005,2010   EGD   History of bronchitis    History of measles    History of mumps    History of strep sore throat    History of urinary tract infection    Hyperlipemia    Hypothyroidism    IBS (irritable bowel syndrome)    Imbalance    Internal hemorrhoids without mention of complication 1995,2005   Colonoscopy    Itching    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HCC)    Menopause    Migraine    OSA (obstructive sleep apnea) 05/14/2015   on BiPAP   Osteopenia 10/2013   T score -1.6 FRAX 10%/1.6%   PAF (paroxysmal atrial fibrillation) (HCC)    Pancreatitis    PCO (posterior capsular opacification)    left   Pneumonia    childhood  illness   PONV (postoperative nausea and vomiting)    Pseudophakia, both eyes    PVD (posterior vitreous detachment) right   Rash    on back    Retinal scar    left   Rotator cuff disorder    pain, left shoulder   Shingles    Stricture and stenosis of esophagus 2005,2010   EGD    Stroke (HCC)    Ulcerative colitis (HCC)    Varicose veins    Vertigo    Wears glasses     Objective:  Physical Exam: Vascular: DP/PT pulses 2/4 bilateral. CFT <3 seconds. Absent hair growth on digits. Edema noted to bilateral lower extremities. Xerosis noted bilaterally.  Skin. No lacerations or abrasions bilateral feet. Nails 1-5 bilateral  are thickened discolored and elongated with subungual debris. Hyperkeratotic cored lesion noted to medial right hallux.  Musculoskeletal: MMT 5/5 bilateral lower extremities in DF, PF, Inversion and Eversion. Deceased ROM in DF of ankle joint. HAV deformity noted bilateral. Hammered second digit on right and deformity noted to second on left from failed hammertoe surgery.  Neurological: Sensation intact to  light touch. Protective sensation diminished bilateral.    Assessment:   1. Pain due to onychomycosis of toenails of both feet   2. Corns and callosities   3. Chronic anticoagulation      Plan:  Patient was evaluated and treated and all questions answered. --Hyeprkeratotic tissue to right great toe debrided without incident with chisel as courtesy -No signs of infection noted. Advised on toe separator and padding.  -Mechanically debrided all nails 1-5 bilateral using sterile nail nipper and filed with dremel without incident  -Clinical picture and Fungal culture was obtained by removing a portion of the hard nail itself from each of the involved toenails using a sterile nail nipper and sent to [facility] lab. Patient tolerated the biopsy procedure well without discomfort or need for anesthesia.  -Discussed fungal nail treatment options including oral, topical, and  laser treatments.  -Answered all patient questions -Patient to return  in 3 months for at risk foot care -Patient advised to call the office if any problems or questions arise in the meantime.   Jennefer Moats, DPM

## 2023-07-14 DIAGNOSIS — E538 Deficiency of other specified B group vitamins: Secondary | ICD-10-CM | POA: Diagnosis not present

## 2023-07-21 DIAGNOSIS — T1512XA Foreign body in conjunctival sac, left eye, initial encounter: Secondary | ICD-10-CM | POA: Diagnosis not present

## 2023-07-25 ENCOUNTER — Other Ambulatory Visit: Payer: Self-pay | Admitting: Podiatry

## 2023-07-25 DIAGNOSIS — M2010 Hallux valgus (acquired), unspecified foot: Secondary | ICD-10-CM | POA: Diagnosis not present

## 2023-07-25 DIAGNOSIS — E039 Hypothyroidism, unspecified: Secondary | ICD-10-CM | POA: Diagnosis not present

## 2023-07-25 DIAGNOSIS — I700 Atherosclerosis of aorta: Secondary | ICD-10-CM | POA: Diagnosis not present

## 2023-07-25 DIAGNOSIS — E538 Deficiency of other specified B group vitamins: Secondary | ICD-10-CM | POA: Diagnosis not present

## 2023-07-25 DIAGNOSIS — I480 Paroxysmal atrial fibrillation: Secondary | ICD-10-CM | POA: Diagnosis not present

## 2023-08-23 DIAGNOSIS — F411 Generalized anxiety disorder: Secondary | ICD-10-CM | POA: Diagnosis not present

## 2023-08-23 DIAGNOSIS — E538 Deficiency of other specified B group vitamins: Secondary | ICD-10-CM | POA: Diagnosis not present

## 2023-08-23 DIAGNOSIS — R8290 Unspecified abnormal findings in urine: Secondary | ICD-10-CM | POA: Diagnosis not present

## 2023-08-31 DIAGNOSIS — H18832 Recurrent erosion of cornea, left eye: Secondary | ICD-10-CM | POA: Diagnosis not present

## 2023-09-11 ENCOUNTER — Other Ambulatory Visit: Payer: Self-pay | Admitting: Cardiology

## 2023-09-11 DIAGNOSIS — I480 Paroxysmal atrial fibrillation: Secondary | ICD-10-CM

## 2023-09-12 ENCOUNTER — Ambulatory Visit (HOSPITAL_COMMUNITY): Admission: EM | Admit: 2023-09-12 | Discharge: 2023-09-12 | Discharge status: Against Medical Advice

## 2023-09-12 NOTE — Telephone Encounter (Signed)
 Prescription refill request for Eliquis received. Indication:aib Last office visit:8/24 Scr:0.77  8/24 Age: 83 Weight:70.7  kg  Prescription refilled

## 2023-09-20 ENCOUNTER — Ambulatory Visit (HOSPITAL_BASED_OUTPATIENT_CLINIC_OR_DEPARTMENT_OTHER)
Admission: RE | Admit: 2023-09-20 | Discharge: 2023-09-20 | Disposition: A | Discharge status: Home / Self Care | Source: Ambulatory Visit | Attending: Internal Medicine | Admitting: Internal Medicine

## 2023-09-20 ENCOUNTER — Other Ambulatory Visit (HOSPITAL_COMMUNITY): Payer: Self-pay | Admitting: Internal Medicine

## 2023-09-20 DIAGNOSIS — I6381 Other cerebral infarction due to occlusion or stenosis of small artery: Secondary | ICD-10-CM | POA: Insufficient documentation

## 2023-09-20 DIAGNOSIS — R4189 Other symptoms and signs involving cognitive functions and awareness: Secondary | ICD-10-CM

## 2023-09-20 DIAGNOSIS — R443 Hallucinations, unspecified: Secondary | ICD-10-CM | POA: Diagnosis not present

## 2023-09-20 DIAGNOSIS — R319 Hematuria, unspecified: Secondary | ICD-10-CM | POA: Diagnosis not present

## 2023-09-20 DIAGNOSIS — F03911 Unspecified dementia, unspecified severity, with agitation: Secondary | ICD-10-CM | POA: Diagnosis not present

## 2023-09-28 ENCOUNTER — Ambulatory Visit: Admitting: Neurology

## 2023-09-28 ENCOUNTER — Encounter: Payer: Self-pay | Admitting: Neurology

## 2023-09-28 ENCOUNTER — Telehealth: Payer: Self-pay | Admitting: Neurology

## 2023-09-28 VITALS — BP 125/72 | HR 68 | Ht 67.0 in | Wt 156.0 lb

## 2023-09-28 DIAGNOSIS — G629 Polyneuropathy, unspecified: Secondary | ICD-10-CM | POA: Diagnosis not present

## 2023-09-28 DIAGNOSIS — R413 Other amnesia: Secondary | ICD-10-CM

## 2023-09-28 DIAGNOSIS — F02A0 Dementia in other diseases classified elsewhere, mild, without behavioral disturbance, psychotic disturbance, mood disturbance, and anxiety: Secondary | ICD-10-CM

## 2023-09-28 DIAGNOSIS — G3184 Mild cognitive impairment, so stated: Secondary | ICD-10-CM

## 2023-09-28 MED ORDER — MEMANTINE HCL 10 MG PO TABS: 10.0000 mg | ORAL_TABLET | Freq: Two times a day (BID) | ORAL | 5 refills | Status: DC

## 2023-09-28 MED ORDER — MEMANTINE HCL 28 X 5 MG & 21 X 10 MG PO TABS: ORAL_TABLET | ORAL | 12 refills | Status: AC

## 2023-09-28 NOTE — Patient Instructions (Addendum)
Xanax Rx sent to pharmacy.

## 2023-09-28 NOTE — Progress Notes (Signed)
 [facility] [address] [place]. [place] [address] [phone]       OFFICE CONSULT NOTE  Ms. Karaline Wiebke Date of Birth:  1940-08-06 Medical Record Number:  [id number]   Referring MD: Ezell Hollow  Reason for Referral: Memory and cognitive difficulties    HPI: Kimberly [name] is a 83 year old pleasant Caucasian lady seen today for office consultation visit for memory and cognitive concerns.  She is accompanied by her brother today.  History is obtained from them and review of electronic medical records and referral notes.  I have personally reviewed pertinent available imaging films in PACS.  She has past medical history of paroxysmal A-fib on Eliquis , lacunar stroke, fibromyalgia, hypothyroidism, gastroesophageal reflux disease, osteoarthritis, irritable bowel syndrome, anxiety depression and diverticulosis.  Patient states for the last year or so she has had some cognitive issues.  She reports that she poor short-term memory as well as more recently word finding difficulties and trouble completing sentences having to stop midsentence.  She gets quite irritated and frustrated easily.  She is also being hallucinating and seeing her dead husband who died a few years ago he still bothering her and she quite gets distressed with this.  She finds these hallucinations as terrifying even though she knows that they are not real.  She denies any definite prior history of stroke except she feels she may have had a stroke in very early childhood.  She has not had any lab work for reversible causes of cognitive impairment or any recent brain imaging except a CT scan of the head which was done on 09/20/2023 which showed no acute abnormalities.  She also complains of tingling numbness and burning in her feet which she has had for years.  She does feel she has peripheral neuropathy but she has never been formally evaluated or treated for the same.  The paresthesias are not bothersome she  is learned to live with it.  She does have gait imbalance but she is learned to walk safely with a walker and she has had no falls or injuries.  She denies any family history of dementia.  Patient has no prior history of seizures, significant head injury with loss of consciousness or stroke/any residual deficits.  She has A-fib and takes Eliquis  and tolerating well with minor bruising and no bleeding.  ROS:   14 system review of systems is positive for memory loss, word finding difficulties, trouble speaking, agitation, irritation, hallucinations, tingling, numbness, burning all other systems negative  PMH:  Past Medical History:  Diagnosis Date   Allergy    Anal polyp 1998   Flex Sig    Anemia    Angular blepharitis of left eye    Ankle fracture    Stress fracture   Anxiety    Aortic atherosclerosis (HCC)    Asthma    border line has inhaler   Bunion    Cataracts, bilateral    Corneal scar    left eye   CPAP (continuous positive airway pressure) dependence    Degenerative disc disease    Depression    Diverticulosis of colon (without mention of hemorrhage) 2010   Colonoscopy   Dry eyes    bilateral   Enterocele    External hemorrhoids 2000   Colonoscopy   Family history of malignant neoplasm of gastrointestinal tract    Female cystocele    Fibromyalgia    GERD (gastroesophageal reflux disease)    Hiatal hernia 2005,2010   EGD  History of bronchitis    History of measles    History of mumps    History of strep sore throat    History of urinary tract infection    Hyperlipemia    Hypothyroidism    IBS (irritable bowel syndrome)    Imbalance    Internal hemorrhoids without mention of complication 1995,2005   Colonoscopy    Itching    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HCC)    Menopause    Migraine    OSA (obstructive sleep apnea) 05/14/2015   on BiPAP   Osteopenia 10/2013   T score -1.6 FRAX 10%/1.6%   PAF (paroxysmal atrial fibrillation) (HCC)    Pancreatitis    PCO (posterior  capsular opacification)    left   Pneumonia    childhood illness   PONV (postoperative nausea and vomiting)    Pseudophakia, both eyes    PVD (posterior vitreous detachment) right   Rash    on back    Retinal scar    left   Rotator cuff disorder    pain, left shoulder   Shingles    Stricture and stenosis of esophagus 2005,2010   EGD    Stroke (HCC)    Ulcerative colitis (HCC)    Varicose veins    Vertigo    Wears glasses     Social History:  Social History   Socioeconomic History   Marital status: Married    Spouse name: Not on file   Number of children: 1   Years of education: bus.school   Highest education level: Not on file  Occupational History   Occupation: Retired    Associate Professor: RETIRED  Tobacco Use   Smoking status: Former    Current packs/day: 0.00    Average packs/day: 0.3 packs/day for 15.0 years (3.8 ttl pk-yrs)    Types: Cigarettes    Start date: 05/31/1960    Quit date: 06/01/1975    Years since quitting: 48.3   Smokeless tobacco: Never   Tobacco comments:    Stopped smoking age 38  Vaping Use   Vaping status: Never Used  Substance and Sexual Activity   Alcohol use: No    Alcohol/week: 0.0 standard drinks of alcohol   Drug use: No   Sexual activity: Never    Birth control/protection: Surgical    Comment: HYST, first intercourse >16, sexual partner less than 5  Other Topics Concern   Not on file  Social History Narrative   Daily caffeine    Social Drivers of Corporate investment banker Strain: Not on file  Food Insecurity: Not on file  Transportation Needs: Not on file  Physical Activity: Not on file  Stress: Not on file  Social Connections: Not on file  Intimate Partner Violence: Not on file    Medications:   Current Outpatient Medications on File Prior to Visit  Medication Sig Dispense Refill   acetaminophen  (TYLENOL ) 500 MG tablet Take 500 mg by mouth every 6 (six) hours as needed for moderate pain.     albuterol  (VENTOLIN  HFA) 108  (90 Base) MCG/ACT inhaler Inhale 2 puffs into the lungs every 6 (six) hours as needed for wheezing or shortness of breath. 18 g 3   azelastine  (ASTELIN ) 0.1 % nasal spray Place 2 sprays into both nostrils 2 (two) times daily as needed for rhinitis. Use in each nostril as directed 30 mL 5   CALTRATE 600+D3 SOFT 600-20 MG-MCG CHEW daily at 6 (six) AM.     Cholecalciferol 100 MCG (4000  UT) TABS Take by mouth.     cycloSPORINE  (RESTASIS ) 0.05 % ophthalmic emulsion Place 1 drop into both eyes 2 (two) times daily.     diltiazem  (CARDIZEM  CD) 240 MG 24 hr capsule Take 1 capsule (240 mg total) by mouth daily. 90 capsule 3   ELIQUIS  5 MG TABS tablet TAKE 1 TABLET BY MOUTH TWICE A DAY 180 tablet 1   EPINEPHrine  0.3 mg/0.3 mL IJ SOAJ injection Inject 0.3 mg into the muscle as needed for anaphylaxis. 2 each 1   fluticasone  (FLONASE ) 50 MCG/ACT nasal spray PLACE 2 SPRAYS INTO BOTH NOSTRILS 2 (TWO) TIMES DAILY AS NEEDED FOR ALLERGIES. *DUE FOR OFFICE VISIT 16 mL 1   KLOR-CON  M10 10 MEQ tablet Take 10 mEq by mouth as needed (TAKE ONLY WHEN TAKE A LASIX ).     Levothyroxine  Sodium 112 MCG/ML SOLN Take 112 mcg by mouth every morning.     meclizine (ANTIVERT) 12.5 MG tablet Take 2 tablets by mouth as directed.     MIRALAX  17 GM/SCOOP powder Take 17 g by mouth as needed.     OLANZapine (ZYPREXA) 2.5 MG tablet Take 2.5 mg by mouth at bedtime.     Polyethyl Glycol-Propyl Glycol (SYSTANE) 0.4-0.3 % SOLN Apply 2 drops to eye daily as needed. 30 mL 5   rosuvastatin  (CRESTOR ) 10 MG tablet Take 5 mg by mouth at bedtime.  4   No current facility-administered medications on file prior to visit.    Allergies:   Allergies  Allergen Reactions   Amoxicillin-Pot Clavulanate Nausea Only, Other (See Comments) and Swelling    Has patient had a PCN reaction causing immediate rash, facial/tongue/throat swelling, SOB or lightheadedness with hypotension: No  Has patient had a PCN reaction causing severe rash involving mucus  membranes or skin necrosis: No  Has patient had a PCN reaction that required hospitalization No  Has patient had a PCN reaction occurring within the last 10 years: No  If all of the above answers are "NO", then may proceed with Cephalosporin use.  Other reaction(s): nausea  Other Reaction(s): GI Intolerance   Azithromycin Other (See Comments)    Pancreatitis  Other reaction(s): Other, pancreatitis  Other Reaction(s): Other (See Comments)  pancreatitis   Erythromycin Hives and Other (See Comments)    Reaction 1 time, when she was younger.  Other reaction(s): hives   Almond Oil Other (See Comments)    Almonds: per allergy testing.   Amoxicillin Nausea Only   Atorvastatin Other (See Comments)    Muscle weakness  Muscle weakness  Other reaction(s): muscle weakness, Other   Clavulanic Acid Nausea Only    Other Reaction(s): GI Intolerance   Codeine Nausea Only and Other (See Comments)    Other reaction(s): Nausea/Vomiting  Other Reaction(s): GI Intolerance   Ketoconazole Nausea And Vomiting   Ketoconazole Nausea And Vomiting, Nausea Only and Other (See Comments)    Other reaction(s): Unknown  Other Reaction(s): GI Intolerance   Latex Other (See Comments)    Other  Other Reaction(s): Other (See Comments)  Other,   Morphine  Nausea Only    Other reaction(s): Unknown  Other Reaction(s): GI Intolerance   Shellfish Allergy Other (See Comments)    Shellfish mix: per allergy testing.   Simvastatin Other (See Comments)    Muscle pain Other reaction(s): muscle pain, Other   Sulfamethoxazole-Trimethoprim Nausea Only    Upset stomach  Other Reaction(s): GI Intolerance   Clindamycin Rash   Clindamycin/Lincomycin Nausea And Vomiting and Rash   Erythromycin Base  Rash    Physical Exam General: well developed, well nourished pleasant elderly Caucasian lady, seated, in no evident distress Head: head normocephalic and atraumatic.   Neck: supple with no carotid or  supraclavicular bruits Cardiovascular: regular rate and rhythm, no murmurs Musculoskeletal: no deformity Skin:  no rash/petichiae Vascular:  Normal pulses all extremities  Neurologic Exam Mental Status: Awake and fully alert. Oriented to place and time. Recent and remote memory intact. Attention span, concentration and fund of knowledge appropriate. Mood and affect appropriate.  Functional activity questionnaire scored only 4 which was quite independent.  Mini-Mental status exam score 21/30 with deficits in orientation, attention and calculation and recall.  She was able to name only 7 animals which can walk on 4 legs.  Clock drawing score was 3/4. Cranial Nerves: Fundoscopic exam reveals sharp disc margins. Pupils equal, briskly reactive to light. Extraocular movements full without nystagmus. Visual fields full to confrontation. Hearing intact. Facial sensation intact. Face, tongue, palate moves normally and symmetrically.  Motor: Normal bulk and tone. Normal strength in all tested extremity muscles. Sensory.:  Diminished touch , pinprick , position and vibratory sensation in both lower extremities from mid calf down..  Coordination: Rapid alternating movements normal in all extremities. Finger-to-nose and heel-to-shin performed accurately bilaterally.  Unsteady on her narrow-base with eyes open and closed. Gait and Station: Arises from chair without difficulty. Stance is normal.  Uses a walker and walks with a cautious slightly broad-based gait.  Unable to walk tandem. Reflexes: 1+ and symmetric. Toes downgoing.        09/28/2023    1:37 PM  MMSE - Mini Mental State Exam  Orientation to time 3  Orientation to Place 4  Registration 3  Attention/ Calculation 1  Recall 1  Language- name 2 objects 2  Language- repeat 1  Language- follow 3 step command 3  Language- read & follow direction 1  Write a sentence 1  Copy design 1  Total score 21     ASSESSMENT: 83 year old lady with subacute  memory and cognitive difficulties likely from mild cognitive impairment versus early dementia.  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     PLAN:I had a long discussion with the patient and her brother regarding her back due to progressive memory and cognitive difficulties which likely represent mild cognitive impairment progressing into early dementia.  I recommend further evaluation by checking dementia panel labs, EEG and MRI scan.  She also has peripheral neuropathy which has not been evaluated hence we will check neuropathy panel labs as well as EMG nerve conduction study.  Trial of Namenda starter pack and if tolerated 10 mg twice daily.  I also encouraged the patient to increase participation in cognitively challenging activities like solving crossword puzzles, playing bridge and sudoku.  We also discussed memory compensation strategies.  I also advised her to use a walker at all times and we discussed fall safety precautions as well.  She will return for follow-up in the future in 6 months or call earlier if necessary. Greater than 50% time during this prolonged 60-minute consultation visit was spent in counseling and coordination of care about her memory loss and cognitive impairment and discussion of her peripheral neuropathy evaluation and treatment and answering questions. Ardella Beaver, MD Note: This document was prepared with digital dictation and possible smart phrase technology. Any transcriptional errors that result from this process are unintentional.

## 2023-09-28 NOTE — Telephone Encounter (Signed)
 Appointment details confirmed

## 2023-09-29 ENCOUNTER — Telehealth: Payer: Self-pay | Admitting: Neurology

## 2023-09-29 ENCOUNTER — Ambulatory Visit: Admitting: Neurology

## 2023-09-29 DIAGNOSIS — R4182 Altered mental status, unspecified: Secondary | ICD-10-CM | POA: Diagnosis not present

## 2023-09-29 DIAGNOSIS — R413 Other amnesia: Secondary | ICD-10-CM

## 2023-09-29 NOTE — Telephone Encounter (Signed)
 Kimberly [name]: [id number] exp. 09/29/23-11/28/23 sent to GI [phone]

## 2023-09-30 LAB — NEUROPATHY PANEL
A/G Ratio: 1.4 (ref 0.7–1.7)
Albumin ELP: 3.9 g/dL (ref 2.9–4.4)
Alpha 1: 0.2 g/dL (ref 0.0–0.4)
Alpha 2: 0.6 g/dL (ref 0.4–1.0)
Angio Convert Enzyme: 14 U/L (ref 14–82)
Anti Nuclear Antibody (ANA): NEGATIVE
Beta: 1.1 g/dL (ref 0.7–1.3)
Gamma Globulin: 0.8 g/dL (ref 0.4–1.8)
Globulin, Total: 2.7 g/dL (ref 2.2–3.9)
Rheumatoid fact SerPl-aCnc: <10 [IU]/mL (ref <14.0)
Sed Rate: 4 mm/h (ref 0–40)
TSH: 4.65 u[IU]/mL — ABNORMAL HIGH (ref 0.450–4.500)
Total Protein: 6.6 g/dL (ref 6.0–8.5)
Vit D, 25-Hydroxy: 33.6 ng/mL (ref 30.0–100.0)
Vitamin B-12: 926 pg/mL (ref 232–1245)

## 2023-09-30 LAB — DEMENTIA PANEL
Homocysteine: 9.8 umol/L (ref 0.0–21.3)
RPR Ser Ql: NONREACTIVE

## 2023-10-01 NOTE — Progress Notes (Signed)
 Kindly inform the patient that lab work for reversible causes of peripheral neuropathy was mostly negative.  Her thyroid  hormone is slightly underactive and to kindly see primary care physician for treatment of the same.

## 2023-10-03 ENCOUNTER — Telehealth: Payer: Self-pay | Admitting: Neurology

## 2023-10-03 DIAGNOSIS — F39 Unspecified mood [affective] disorder: Secondary | ICD-10-CM | POA: Diagnosis not present

## 2023-10-03 DIAGNOSIS — R600 Localized edema: Secondary | ICD-10-CM | POA: Diagnosis not present

## 2023-10-03 DIAGNOSIS — R441 Visual hallucinations: Secondary | ICD-10-CM | POA: Diagnosis not present

## 2023-10-03 DIAGNOSIS — F0390 Unspecified dementia without behavioral disturbance: Secondary | ICD-10-CM | POA: Diagnosis not present

## 2023-10-03 NOTE — Telephone Encounter (Signed)
 Called the patient and reviewed the lab results with her. Overall stable, I have advised of the TSH level slightly underactive and advised a copy will be forwarded to her PCP

## 2023-10-03 NOTE — Telephone Encounter (Signed)
-----   Message from Ardella Beaver sent at 10/01/2023  3:40 PM EDT ----- Kimberly [name] inform the patient that lab work for reversible causes of peripheral neuropathy was mostly negative.  Her thyroid  hormone is slightly underactive and to kindly see primary care physician for treatment of the same.

## 2023-10-06 DIAGNOSIS — I4891 Unspecified atrial fibrillation: Secondary | ICD-10-CM | POA: Diagnosis not present

## 2023-10-06 DIAGNOSIS — D6869 Other thrombophilia: Secondary | ICD-10-CM | POA: Diagnosis not present

## 2023-10-06 DIAGNOSIS — F0390 Unspecified dementia without behavioral disturbance: Secondary | ICD-10-CM | POA: Diagnosis not present

## 2023-10-06 DIAGNOSIS — E039 Hypothyroidism, unspecified: Secondary | ICD-10-CM | POA: Diagnosis not present

## 2023-10-06 DIAGNOSIS — Z0000 Encounter for general adult medical examination without abnormal findings: Secondary | ICD-10-CM | POA: Diagnosis not present

## 2023-10-06 DIAGNOSIS — Z23 Encounter for immunization: Secondary | ICD-10-CM | POA: Diagnosis not present

## 2023-10-06 DIAGNOSIS — M810 Age-related osteoporosis without current pathological fracture: Secondary | ICD-10-CM | POA: Diagnosis not present

## 2023-10-06 DIAGNOSIS — E785 Hyperlipidemia, unspecified: Secondary | ICD-10-CM | POA: Diagnosis not present

## 2023-10-06 DIAGNOSIS — I700 Atherosclerosis of aorta: Secondary | ICD-10-CM | POA: Diagnosis not present

## 2023-10-10 ENCOUNTER — Ambulatory Visit: Payer: Medicare PPO | Admitting: Podiatry

## 2023-10-10 ENCOUNTER — Encounter: Payer: Self-pay | Admitting: Podiatry

## 2023-10-10 DIAGNOSIS — M79675 Pain in left toe(s): Secondary | ICD-10-CM

## 2023-10-10 DIAGNOSIS — Z7901 Long term (current) use of anticoagulants: Secondary | ICD-10-CM | POA: Diagnosis not present

## 2023-10-10 DIAGNOSIS — M79674 Pain in right toe(s): Secondary | ICD-10-CM

## 2023-10-10 DIAGNOSIS — B351 Tinea unguium: Secondary | ICD-10-CM | POA: Diagnosis not present

## 2023-10-10 NOTE — Progress Notes (Signed)
 Subjective:  Patient ID: Kimberly [name], female    DOB: 06/12/1940,   MRN: [id number]  No chief complaint on file.   83 y.o. female presents for concern of thickened elongated and painful nails that are difficult to trim. Requesting to have them trimmed today.  She is on chronic anticoagulation and at risk for foot care  PCP:  Husain, Karrar, MD    . Denies any other pedal complaints. Denies n/v/f/c.   Past Medical History:  Diagnosis Date   Allergy    Anal polyp 1998   Flex Sig    Anemia    Angular blepharitis of left eye    Ankle fracture    Stress fracture   Anxiety    Aortic atherosclerosis (HCC)    Asthma    border line has inhaler   Bunion    Cataracts, bilateral    Corneal scar    left eye   CPAP (continuous positive airway pressure) dependence    Degenerative disc disease    Depression    Diverticulosis of colon (without mention of hemorrhage) 2010   Colonoscopy   Dry eyes    bilateral   Enterocele    External hemorrhoids 2000   Colonoscopy   Family history of malignant neoplasm of gastrointestinal tract    Female cystocele    Fibromyalgia    GERD (gastroesophageal reflux disease)    Hiatal hernia 2005,2010   EGD   History of bronchitis    History of measles    History of mumps    History of strep sore throat    History of urinary tract infection    Hyperlipemia    Hypothyroidism    IBS (irritable bowel syndrome)    Imbalance    Internal hemorrhoids without mention of complication 1995,2005   Colonoscopy    Itching    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HCC)    Menopause    Migraine    OSA (obstructive sleep apnea) 05/14/2015   on BiPAP   Osteopenia 10/2013   T score -1.6 FRAX 10%/1.6%   PAF (paroxysmal atrial fibrillation) (HCC)    Pancreatitis    PCO (posterior capsular opacification)    left   Pneumonia    childhood illness   PONV (postoperative nausea and vomiting)    Pseudophakia, both eyes    PVD (posterior vitreous detachment) right   Rash     on back    Retinal scar    left   Rotator cuff disorder    pain, left shoulder   Shingles    Stricture and stenosis of esophagus 2005,2010   EGD    Stroke (HCC)    Ulcerative colitis (HCC)    Varicose veins    Vertigo    Wears glasses     Objective:  Physical Exam: Vascular: DP/PT pulses 2/4 bilateral. CFT <3 seconds. Absent hair growth on digits. Edema noted to bilateral lower extremities. Xerosis noted bilaterally.  Skin. No lacerations or abrasions bilateral feet. Nails 1-5 bilateral  are thickened discolored and elongated with subungual debris. Hyperkeratotic cored lesion noted to medial right hallux.  Musculoskeletal: MMT 5/5 bilateral lower extremities in DF, PF, Inversion and Eversion. Deceased ROM in DF of ankle joint. HAV deformity noted bilateral. Hammered second digit on right and deformity noted to second on left from failed hammertoe surgery.  Neurological: Sensation intact to light touch. Protective sensation diminished bilateral.    Assessment:   1. Pain due to onychomycosis of toenails of both feet   2. Chronic  anticoagulation      Plan:  Patient was evaluated and treated and all questions answered. --Hyeprkeratotic tissue to right great toe debrided without incident with chisel as courtesy -No signs of infection noted. Advised on toe separator and padding.  -Mechanically debrided all nails 1-5 bilateral using sterile nail nipper and filed with dremel without incident  -Culture negative for fungus. Kimberly [name]  -Answered all patient questions -Patient to return  in 3 months for at risk foot care -Patient advised to call the office if any problems or questions arise in the meantime.   Jennefer Moats, DPM

## 2023-10-15 ENCOUNTER — Ambulatory Visit: Payer: Self-pay | Admitting: Neurology

## 2023-10-15 NOTE — Progress Notes (Signed)
 Kindly inform the patient that EEG of brainwave study was normal with no evidence of seizure activity noted

## 2023-10-17 ENCOUNTER — Other Ambulatory Visit: Payer: Self-pay | Admitting: Neurology

## 2023-10-17 MED ORDER — ALPRAZOLAM 0.5 MG PO TABS: 0.2500 mg | ORAL_TABLET | Freq: Once | ORAL | 0 refills | Status: AC | PRN

## 2023-10-17 NOTE — Telephone Encounter (Signed)
 Called the pt to review the EEG results. Advised that there was nothing to be concerned. There was no abnormal brain activity.  Pt is scheduled to have MRI on wed and is concerned because she is claustrophobic. She has a driver scheduled. Advised we could offer a medication to help relax her. She doesn't recall having taken alprazolam  before. Given her age, advised she could take 0.25 mg 30 min prior to MRI and additional 0.5 tab if needed. Pt verbalized understanding.

## 2023-10-17 NOTE — Addendum Note (Signed)
 Addended by: Elton Ham on: 10/17/2023 11:46 AM   Modules accepted: Orders

## 2023-10-17 NOTE — Telephone Encounter (Signed)
-----   Message from Kimberly [name] sent at 10/15/2023  2:53 PM EDT ----- [name] inform the patient that EEG of brainwave study was normal with no evidence of seizure activity noted

## 2023-10-17 NOTE — Addendum Note (Signed)
 Addended by: Rodrickus Min on: 10/17/2023 12:27 PM   Modules accepted: Orders

## 2023-10-19 ENCOUNTER — Ambulatory Visit
Admission: RE | Admit: 2023-10-19 | Discharge: 2023-10-19 | Disposition: A | Discharge status: Home / Self Care | Source: Ambulatory Visit | Attending: Neurology | Admitting: Neurology

## 2023-10-19 DIAGNOSIS — R413 Other amnesia: Secondary | ICD-10-CM | POA: Diagnosis not present

## 2023-10-19 MED ORDER — GADOPICLENOL 0.5 MMOL/ML IV SOLN
7.0000 mL | Freq: Once | INTRAVENOUS | Status: AC | PRN
  Administered 2023-10-19: 7 mL via INTRAVENOUS

## 2023-10-20 ENCOUNTER — Telehealth: Payer: Self-pay | Admitting: Neurology

## 2023-10-20 ENCOUNTER — Other Ambulatory Visit: Payer: Self-pay | Admitting: Neurology

## 2023-10-20 NOTE — Telephone Encounter (Signed)
 Pt called wanting to know if her imaging results have come in. Please advise.

## 2023-10-20 NOTE — Telephone Encounter (Signed)
 Returned call to patient and advised no results were in yet. Advised we would reach out when we receive them. Patient very anxious about getting to drive again.

## 2023-10-20 NOTE — Telephone Encounter (Signed)
 Pt called in regards to see if she is was able to drive . Pt would like to discuss this with Doctor

## 2023-10-21 ENCOUNTER — Telehealth: Payer: Self-pay | Admitting: Diagnostic Neuroimaging

## 2023-10-21 DIAGNOSIS — R413 Other amnesia: Secondary | ICD-10-CM

## 2023-10-21 DIAGNOSIS — G629 Polyneuropathy, unspecified: Secondary | ICD-10-CM

## 2023-10-21 DIAGNOSIS — F02A0 Dementia in other diseases classified elsewhere, mild, without behavioral disturbance, psychotic disturbance, mood disturbance, and anxiety: Secondary | ICD-10-CM

## 2023-10-21 DIAGNOSIS — G3184 Mild cognitive impairment, so stated: Secondary | ICD-10-CM

## 2023-10-21 NOTE — Telephone Encounter (Signed)
 Patient calling about driving safety ability. Will defer this to Dr Ardella Beaver, MD and PCP to discuss next week. -VRP

## 2023-10-25 DIAGNOSIS — G629 Polyneuropathy, unspecified: Secondary | ICD-10-CM | POA: Diagnosis not present

## 2023-10-25 DIAGNOSIS — R600 Localized edema: Secondary | ICD-10-CM | POA: Diagnosis not present

## 2023-10-25 DIAGNOSIS — I872 Venous insufficiency (chronic) (peripheral): Secondary | ICD-10-CM | POA: Diagnosis not present

## 2023-10-25 NOTE — Telephone Encounter (Signed)
 Pt had also left a message on 5/22 asking for MRI results and if ok to drive. This was sent to MD to review  IMPRESSION: MRI scan of the brain with and without contrast showing only mild age-related changes of chronic small vessel disease and generalized cerebral atrophy.  No acute abnormalities.  Pt completed EEG which was normal.  MMSE at last ov in April was 21/30

## 2023-10-28 NOTE — Telephone Encounter (Signed)
 Pt called stating that her PCP gets a copy of her MRI and EEG results. Pt stated that she would like a copy as well mailed to her.

## 2023-10-31 ENCOUNTER — Telehealth: Payer: Self-pay | Admitting: *Deleted

## 2023-10-31 NOTE — Telephone Encounter (Signed)
 Called pt lvm. I mailed pt mri results and I faxed a copy to Dr Husain .

## 2023-10-31 NOTE — Telephone Encounter (Signed)
 Called the patient back and made her aware of the driver rehab services recommendation evaluation. Informed I will place a copy of this information in the mail. Pt verbalized understanding.

## 2023-10-31 NOTE — Telephone Encounter (Signed)
 Called the patient back and advised of the findings and recommendations by Dr Janett Medin,  "Inform the patient that MRI scan shows changes of hardening of the arteries and mild shrinkage of the brain.  Since patient is having memory and cognitive difficulties hide not sure that it is safe for her to drive.  If she really insists on driving I would recommend a referral to a comprehensive driving evaluation school who can give a very comprehensive assessment but she will have to pay out-of-pocket as insurance would not cover the cost."  Pt was little upset about this but understood. She would like to move forward with comprehensive evaluation for driving. Advised I would contact her back with that information. Will send the results from testing in mail.

## 2023-10-31 NOTE — Addendum Note (Signed)
 Addended by: Elton Ham on: 10/31/2023 02:51 PM   Modules accepted: Orders

## 2023-10-31 NOTE — Telephone Encounter (Signed)
 Contacted patient to relay would be a $200 fee at the time of appointment. Patient stated, would not be able to drive to [facility] to [employer] center. [facility] do not have driver rehab services, but there are other location at [facility] that provide that service. Went over driver services location. Patient stated the closest one would be [facility] in [facility]. Contacted [facility] and they do not provide driver rehab services. Receptionist recommended [employer] in [place] phone number [phone]

## 2023-11-01 ENCOUNTER — Telehealth: Payer: Self-pay | Admitting: Cardiology

## 2023-11-01 NOTE — Telephone Encounter (Signed)
 Pt would like a c/b about the results she received from her Neurologist. She states they don't want her to drive any longer.

## 2023-11-01 NOTE — Telephone Encounter (Signed)
 Pt called again asking to speak to the RN. RN was with a pt at the time and she proceeded to explain to me that she is wanting to know when she can start driving again. I proceeded to explain to her again what was informed to her by the RN of the recommendations from the Provider. She proceeded to say that she does not understand that if the MRI showed that everything was fine then why can't she drive. I tried to tell her that she will be receiving results from the testing in the mail like the RN told her in the last several calls and she proceeded to argue with me that 2 calls were not "several calls". I proceeded to change the subject and tell her that she will be receiving a call from where they will be giving her a driving evaluation. She then stated that her computer is down and can not receive any emails. I again tried to explain to her that she will not be receiving an email that she will be getting a call from where the referral was sent to. Pt continued to try and argue that she should be allowed to drive and does not understand why she can not and I informed her to wait till she gets a call from where she will be evaluated and then she can go from there. Pt was very upset but said ok thank you for your help.

## 2023-11-01 NOTE — Telephone Encounter (Signed)
 Returned call to pt- no answer, unable to leave VM- no dpr.

## 2023-11-02 ENCOUNTER — Emergency Department (HOSPITAL_COMMUNITY)
Admission: EM | Admit: 2023-11-02 | Discharge: 2023-11-03 | Disposition: A | Discharge status: Home / Self Care | Attending: Emergency Medicine | Admitting: Emergency Medicine

## 2023-11-02 ENCOUNTER — Other Ambulatory Visit: Payer: Self-pay

## 2023-11-02 ENCOUNTER — Emergency Department (HOSPITAL_COMMUNITY)

## 2023-11-02 ENCOUNTER — Encounter (HOSPITAL_COMMUNITY): Payer: Self-pay | Admitting: *Deleted

## 2023-11-02 DIAGNOSIS — R918 Other nonspecific abnormal finding of lung field: Secondary | ICD-10-CM | POA: Diagnosis not present

## 2023-11-02 DIAGNOSIS — J188 Other pneumonia, unspecified organism: Secondary | ICD-10-CM | POA: Insufficient documentation

## 2023-11-02 DIAGNOSIS — Z87891 Personal history of nicotine dependence: Secondary | ICD-10-CM | POA: Diagnosis not present

## 2023-11-02 DIAGNOSIS — E039 Hypothyroidism, unspecified: Secondary | ICD-10-CM | POA: Insufficient documentation

## 2023-11-02 DIAGNOSIS — R0600 Dyspnea, unspecified: Secondary | ICD-10-CM | POA: Diagnosis not present

## 2023-11-02 DIAGNOSIS — J189 Pneumonia, unspecified organism: Secondary | ICD-10-CM

## 2023-11-02 DIAGNOSIS — R0602 Shortness of breath: Secondary | ICD-10-CM | POA: Diagnosis present

## 2023-11-02 DIAGNOSIS — J45909 Unspecified asthma, uncomplicated: Secondary | ICD-10-CM | POA: Diagnosis not present

## 2023-11-02 DIAGNOSIS — Z8673 Personal history of transient ischemic attack (TIA), and cerebral infarction without residual deficits: Secondary | ICD-10-CM | POA: Diagnosis not present

## 2023-11-02 LAB — CBC
HCT: 35.2 % — ABNORMAL LOW (ref 36.0–46.0)
Hemoglobin: 11.6 g/dL — ABNORMAL LOW (ref 12.0–15.0)
MCH: 31.6 pg (ref 26.0–34.0)
MCHC: 33 g/dL (ref 30.0–36.0)
MCV: 95.9 fL (ref 80.0–100.0)
Platelets: 203 10*3/uL (ref 150–400)
RBC: 3.67 MIL/uL — ABNORMAL LOW (ref 3.87–5.11)
RDW: 12.8 % (ref 11.5–15.5)
WBC: 5.6 10*3/uL (ref 4.0–10.5)
nRBC: 0 % (ref 0.0–0.2)

## 2023-11-02 LAB — BASIC METABOLIC PANEL WITH GFR
Anion gap: 9 (ref 5–15)
BUN: 27 mg/dL — ABNORMAL HIGH (ref 8–23)
CO2: 25 mmol/L (ref 22–32)
Calcium: 9.5 mg/dL (ref 8.9–10.3)
Chloride: 105 mmol/L (ref 98–111)
Creatinine, Ser: 0.78 mg/dL (ref 0.44–1.00)
GFR, Estimated: >60 mL/min (ref >60)
Glucose, Bld: 96 mg/dL (ref 70–99)
Potassium: 4.1 mmol/L (ref 3.5–5.1)
Sodium: 139 mmol/L (ref 135–145)

## 2023-11-02 LAB — TROPONIN I (HIGH SENSITIVITY): Troponin I (High Sensitivity): 6 ng/L (ref <18)

## 2023-11-02 LAB — BRAIN NATRIURETIC PEPTIDE: B Natriuretic Peptide: 97 pg/mL (ref 0.0–100.0)

## 2023-11-02 NOTE — ED Provider Notes (Signed)
 [facility]PT [facility] Emergency Department Provider Note MRN:  [id number]  Arrival date & time: 11/03/23     Chief Complaint   Shortness of Breath   History of Present Illness   Kimberly [name] is a 83 y.o. year-old female with a history of fibromyalgia, A-fib, stroke presenting to the ED with chief complaint of shortness of breath.  3 to 4 months of leg swelling left greater than right as well as mild dyspnea on exertion.  Denies chest pain.  Persistent productive cough, green-yellow sputum.  Denies fever but having occasional chills.  Review of Systems  A thorough review of systems was obtained and all systems are negative except as noted in the HPI and PMH.   Patient's Health History    Past Medical History:  Diagnosis Date   Allergy    Anal polyp 1998   Flex Sig    Anemia    Angular blepharitis of left eye    Ankle fracture    Stress fracture   Anxiety    Aortic atherosclerosis (HCC)    Asthma    border line has inhaler   Bunion    Cataracts, bilateral    Corneal scar    left eye   CPAP (continuous positive airway pressure) dependence    Degenerative disc disease    Depression    Diverticulosis of colon (without mention of hemorrhage) 2010   Colonoscopy   Dry eyes    bilateral   Enterocele    External hemorrhoids 2000   Colonoscopy   Family history of malignant neoplasm of gastrointestinal tract    Female cystocele    Fibromyalgia    GERD (gastroesophageal reflux disease)    Hiatal hernia 2005,2010   EGD   History of bronchitis    History of measles    History of mumps    History of strep sore throat    History of urinary tract infection    Hyperlipemia    Hypothyroidism    IBS (irritable bowel syndrome)    Imbalance    Internal hemorrhoids without mention of complication 1995,2005   Colonoscopy    Itching    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HCC)    Menopause    Migraine    OSA (obstructive sleep apnea) 05/14/2015   on BiPAP   Osteopenia  10/2013   T score -1.6 FRAX 10%/1.6%   PAF (paroxysmal atrial fibrillation) (HCC)    Pancreatitis    PCO (posterior capsular opacification)    left   Pneumonia    childhood illness   PONV (postoperative nausea and vomiting)    Pseudophakia, both eyes    PVD (posterior vitreous detachment) right   Rash    on back    Retinal scar    left   Rotator cuff disorder    pain, left shoulder   Shingles    Stricture and stenosis of esophagus 2005,2010   EGD    Stroke (HCC)    Ulcerative colitis (HCC)    Varicose veins    Vertigo    Wears glasses     Past Surgical History:  Procedure Laterality Date   ABDOMINAL HYSTERECTOMY     ANAL FISSURE REPAIR     BREAST EXCISIONAL BIOPSY Left    BUNIONECTOMY  2014   CATARACT EXTRACTION Bilateral 2013   COLONOSCOPY     polyp removed   ESOPHAGEAL DILATION     EYE SURGERY Left    FOOT SURGERY     left    HEMORRHOID  SURGERY     MOUTH SURGERY  11/13/11   Cyst removed from gum-benign    NASAL SEPTUM SURGERY     NASAL SEPTUM SURGERY  1970's   REPLACEMENT TOTAL KNEE Right 2011   TONSILLECTOMY     TOTAL ABDOMINAL HYSTERECTOMY W/ BILATERAL SALPINGOOPHORECTOMY  1991   TAH BSO   TOTAL HIP ARTHROPLASTY     right    TOTAL KNEE ARTHROPLASTY     both   TOTAL KNEE ARTHROPLASTY Left 06/23/2015   Procedure: LEFT TOTAL KNEE ARTHROPLASTY;  Surgeon: Liliane Rei, MD;  Location: [facility];  Service: Orthopedics;  Laterality: Left;    Family History  Problem Relation Age of Onset   Dementia Mother    Irritable bowel syndrome Mother    Alzheimer's disease Mother    Stroke Mother    Hypertension Father    Congestive Heart Failure Father    Alcohol abuse Father    Diabetes Sister    Allergy (severe) Sister    Colon cancer Paternal Aunt    Heart disease Maternal Grandmother    Heart attack Maternal Grandmother    Heart attack Paternal Grandfather    Heart attack Maternal Grandfather    Arthritis Brother    Colon polyps Brother    Other Sister         pituitary disease   Allergies Sister    Other Sister        joint problems   Breast cancer Sister 39   Colon polyps Sister    Esophageal cancer Neg Hx    Rectal cancer Neg Hx    Stomach cancer Neg Hx     Social History   Socioeconomic History   Marital status: Married    Spouse name: Not on file   Number of children: 1   Years of education: bus.school   Highest education level: Not on file  Occupational History   Occupation: Retired    Associate Professor: RETIRED  Tobacco Use   Smoking status: Former    Current packs/day: 0.00    Average packs/day: 0.3 packs/day for 15.0 years (3.8 ttl pk-yrs)    Types: Cigarettes    Start date: 05/31/1960    Quit date: 06/01/1975    Years since quitting: 48.4   Smokeless tobacco: Never   Tobacco comments:    Stopped smoking age 66  Vaping Use   Vaping status: Never Used  Substance and Sexual Activity   Alcohol use: No    Alcohol/week: 0.0 standard drinks of alcohol   Drug use: No   Sexual activity: Never    Birth control/protection: Surgical    Comment: HYST, first intercourse >16, sexual partner less than 5  Other Topics Concern   Not on file  Social History Narrative   Daily caffeine    Social Drivers of Corporate investment banker Strain: Not on file  Food Insecurity: Not on file  Transportation Needs: Not on file  Physical Activity: Not on file  Stress: Not on file  Social Connections: Not on file  Intimate Partner Violence: Not on file     Physical Exam   Vitals:   11/02/23 2118  BP: (!) 154/70  Pulse: 65  Resp: 16  Temp: 98.2 F (36.8 C)  SpO2: 97%    CONSTITUTIONAL: Well-appearing, NAD NEURO/PSYCH:  Alert and oriented x 3, no focal deficits EYES:  eyes equal and reactive ENT/NECK:  no LAD, no JVD CARDIO: Regular rate, well-perfused, normal S1 and S2 PULM:  CTAB no wheezing or  rhonchi GI/GU:  non-distended, non-tender MSK/SPINE:  No gross deformities, no edema SKIN:  no rash, atraumatic   *Additional and/or  pertinent findings included in MDM below  Diagnostic and Interventional Summary    EKG Interpretation Date/Time:  Wednesday November 02 2023 21:27:19 EDT Ventricular Rate:  64 PR Interval:  214 QRS Duration:  142 QT Interval:  448 QTC Calculation: 462 R Axis:   -42  Text Interpretation: Sinus rhythm with 1st degree A-V block Left axis deviation Right bundle branch block Abnormal ECG When compared with ECG of 21-Jan-2023 22:23, PREVIOUS ECG IS PRESENT Confirmed by Gwenetta Lennert [phone]) on 11/02/2023 11:09:42 PM       Labs Reviewed  BASIC METABOLIC PANEL WITH GFR - Abnormal; Notable for the following components:      Result Value   BUN 27 (*)    All other components within normal limits  CBC - Abnormal; Notable for the following components:   RBC 3.67 (*)    Hemoglobin 11.6 (*)    HCT 35.2 (*)    All other components within normal limits  RESP PANEL BY RT-PCR (RSV, FLU A&B, COVID)  RVPGX2  BRAIN NATRIURETIC PEPTIDE  TROPONIN I (HIGH SENSITIVITY)  TROPONIN I (HIGH SENSITIVITY)    DG Chest 2 View  Final Result      Medications  doxycycline (VIBRA-TABS) tablet 100 mg (has no administration in time range)     Procedures  /  Critical Care Procedures  ED Course and Medical Decision Making  Initial Impression and Ddx Chronic asymmetric leg swelling left greater than right.  DVT considered but felt to be unlikely given patient's anticoagulated status, endorses full compliance with this medication.  Other considerations include venous insufficiency, CHF especially given the dyspnea on exertion.  Pneumonia is also considered given the productive cough.  Past medical/surgical history that increases complexity of ED encounter: A-fib, stroke  Interpretation of Diagnostics I personally reviewed the EKG and my interpretation is as follows: Sinus rhythm, bundle branch block similar to prior  No significant blood count or electrolyte disturbance.  Troponin negative, BNP unremarkable,  chest x-ray showing evidence of atypical infection.  Patient Reassessment and Ultimate Disposition/Management     Multiple allergies to antibiotics, plan is for discharge on doxycycline.  Patient management required discussion with the following services or consulting groups:  None  Complexity of Problems Addressed Acute illness or injury that poses threat of life of bodily function  Additional Data Reviewed and Analyzed Further history obtained from: Recent Consult notes and Prior labs/imaging results  Additional Factors Impacting ED Encounter Risk Prescriptions  Merrick Abe. Harless Lien, MD [facility] Emergency Medicine [facility] [email or web address]  Final Clinical Impressions(s) / ED Diagnoses     ICD-10-CM   1. Walking pneumonia  J18.9       ED Discharge Orders          Ordered    doxycycline (VIBRAMYCIN) 100 MG capsule  2 times daily        11/03/23 0041             Discharge Instructions Discussed with and Provided to Patient:     Discharge Instructions      You were evaluated in the Emergency Department and after careful evaluation, we did not find any emergent condition requiring admission or further testing in the hospital.  Your exam/testing today was overall reassuring.  The tests on your heart were reassuring.  Your chest x-ray showing some evidence of pneumonia.  Take the doxycycline  antibiotic as directed.  Continue elevating your legs and using compression socks, continue taking your Lasix , follow-up with your primary care doctor.  Please return to the Emergency Department if you experience any worsening of your condition.  Thank you for allowing us  to be a part of your care.      Edson Graces, MD 11/03/23 [phone]

## 2023-11-02 NOTE — Telephone Encounter (Signed)
 Patient is returning call. Patient mentioned she is having swelling in her legs down to her feet.   Pt c/o swelling: STAT is pt has developed SOB within 24 hours  How much weight have you gained and in what time span? "159 then gained 4 lbs in a week or less"  If swelling, where is the swelling located? Swelling in legs, knees, and ankle   Are you currently taking a fluid pill? Yes, 1.5 tablet of Lasix  every morning  Are you currently SOB? Not now, "sometimes I get shortness of breath and raspy voice, unsure if allergy related"  Do you have a log of your daily weights (if so, list)? N/A  Have you gained 3 pounds in a day or 5 pounds in a week? "Yes 4 lbs in week or less"  Have you traveled recently? No

## 2023-11-02 NOTE — Telephone Encounter (Signed)
 Called pt in regards to weight gain. Pt expressing concern r/t neurology advising not to drive d/t cognitive testing.  Advised pt to f/u with Neurology.  Pt reports has tried but has gotten no where. Advised to call back and request an OV.  Reports weight has been consistently at 155- 156 lbs.  Wt noted to be up to 163 on 6/3 and 163.5 today.  Is taking half of a furosemide  40 mg tablet daily along with KCL. Reports SOB, fatigue, legs feel weak and buzzing to bilateral ears. Is currently suffering from nasal /allergies.   Pt recently had neurological imaging per pt that reveals dementia and hallucinations.  Was advised not to drive d/t recent cognitive issues.   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   Pt has a hx of AF denies feeling funny heart beats or fast heart beats.  Has not missed any doses of Eliquis  or diltiazem .  Has not checked BP/HR recently.    Pt noticeably SOB over the phone.  Scheduled OV with Dr. Micael Adas for [facility] June 5 at 9:20 am.  Reviewed office location with pt.  Advised pt of ED precautions.

## 2023-11-02 NOTE — Telephone Encounter (Signed)
 Called pt advised of MD recommendation for ED visit.  Pt reports does not feel needs ED visit thinks SOB could be r/t allergies.  Advised if symptoms worsen to call 911 for ED visit. Pt expresses understanding.

## 2023-11-02 NOTE — ED Notes (Signed)
 Urine sent to lab

## 2023-11-02 NOTE — ED Triage Notes (Signed)
 Shortness of for many months and she has had bi-lateral leg swelling  for 3-4 months

## 2023-11-02 NOTE — ED Notes (Signed)
 Pt ambulatory to restroom

## 2023-11-02 NOTE — Telephone Encounter (Signed)
 As the phone staff has relayed and I have reiterated on the phone two different calls.  *Dr Janett Medin recommends that due to cognitive concerns from the testing in the office he can't recommend her to drive. *He did state pt can proceed with driver rehab testing if she wants to go that direction and I provided the contact information in the mailed packet along with her results.   Per our referral team, Ivin Marrow had also spoke wit the patient on 6/2 informing her of the information related to driver rehab services.  Contacted patient to relay would be a $200 fee at the time of appointment. Patient stated, would not be able to drive to [facility] to [employer] center. [facility] do not have driver rehab services, but there are other location at [facility] that provide that service. Went over driver services location. Patient stated the closest one would be [facility] in [facility]. Contacted [facility] and they do not provide driver rehab services. Receptionist recommended [facility] [employer] in [place] phone number [phone]

## 2023-11-02 NOTE — Telephone Encounter (Signed)
 Call to patient to advise that Dr. Micael Adas has read about patient's concerns in chart and recommends that she go to the ED. Patient endorses she is still SOB and was hoping "I could make it through the night". Advised that if patient comes in to scheduled visit tomorrow she will likely be sent to the ED. Patient agrees to proceed to the ED and names a family member Linell Rhymes) she will call to get a ride.   Due to patient's recently documented change in cognition and inability to drive, placed call to Linell Rhymes with patient's verbal consent. Concha Deed confirms that she can drive patient to the ED and that patient had already messaged her asking for a ride. Carleen Chary to call 911 if she feels patient's shortness of breath is worsening, especially if patient starts to experience chest pain.

## 2023-11-03 ENCOUNTER — Ambulatory Visit: Admitting: Cardiology

## 2023-11-03 LAB — RESP PANEL BY RT-PCR (RSV, FLU A&B, COVID)  RVPGX2
Influenza A by PCR: NEGATIVE
Influenza B by PCR: NEGATIVE
Resp Syncytial Virus by PCR: NEGATIVE
SARS Coronavirus 2 by RT PCR: NEGATIVE

## 2023-11-03 LAB — TROPONIN I (HIGH SENSITIVITY): Troponin I (High Sensitivity): 7 ng/L (ref <18)

## 2023-11-03 MED ORDER — DOXYCYCLINE HYCLATE 100 MG PO CAPS: 100.0000 mg | ORAL_CAPSULE | Freq: Two times a day (BID) | ORAL | 0 refills | Status: AC

## 2023-11-03 MED ORDER — DOXYCYCLINE HYCLATE 100 MG PO TABS
100.0000 mg | ORAL_TABLET | Freq: Once | ORAL | Status: AC
  Administered 2023-11-03: 100 mg via ORAL
  Filled 2023-11-03: qty 1

## 2023-11-03 NOTE — Discharge Instructions (Signed)
 You were evaluated in the Emergency Department and after careful evaluation, we did not find any emergent condition requiring admission or further testing in the hospital.  Your exam/testing today was overall reassuring.  The tests on your heart were reassuring.  Your chest x-ray showing some evidence of pneumonia.  Take the doxycycline antibiotic as directed.  Continue elevating your legs and using compression socks, continue taking your Lasix , follow-up with your primary care doctor.  Please return to the Emergency Department if you experience any worsening of your condition.  Thank you for allowing us  to be a part of your care.

## 2023-11-07 ENCOUNTER — Telehealth: Payer: Self-pay | Admitting: Neurology

## 2023-11-07 NOTE — Telephone Encounter (Signed)
 Appointment details confirmed

## 2023-11-09 ENCOUNTER — Ambulatory Visit: Admitting: Neurology

## 2023-11-09 VITALS — BP 120/61 | HR 71 | Resp 17

## 2023-11-09 DIAGNOSIS — G629 Polyneuropathy, unspecified: Secondary | ICD-10-CM | POA: Diagnosis not present

## 2023-11-09 DIAGNOSIS — R413 Other amnesia: Secondary | ICD-10-CM | POA: Insufficient documentation

## 2023-11-09 DIAGNOSIS — R269 Unspecified abnormalities of gait and mobility: Secondary | ICD-10-CM | POA: Insufficient documentation

## 2023-11-09 NOTE — Procedures (Signed)
 Full Name: Kimberly [name] Gender: Female MRN #: [id number] Date of Birth: 02-22-1941    Visit Date: 11/09/2023 10:45 Age: 83 Years Examining Physician: Phebe Brasil Referring Physician: Janett Medin Height: 5 feet 7 inch History: 83 year old female presenting with subacute onset bilateral lower extremity paresthesia gait abnormality, denies upper extremity involvement  Summary of the test: Nerve conduction study:  Nerve conduction of left lower extremity was limited due to her lower extremity pitting edema.  Left superficial peroneal sensory responses were absent.  Left sural sensory response showed moderately decreased snap amplitude. Left peroneal to EDB motor response was absent.  Left tibial motor responses showed moderately decreased CMAP amplitude.   Left ulnar, median sensory and motor responses were within normal limit.  Electromyography: Selected needle examinations of left upper and lower extremity muscles; left cervical and lumbosacral paraspinal muscles were performed.  There was no significant abnormality noted.  Conclusion: This is a mild abnormal yet technically difficult study due to left lower extremity pitting edema.  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    ------------------------------- Phebe Brasil. M.D. Ph.D.   [facility] [address], [address] [place], [place] [address] Tel: [phone] Fax: [phone]  Verbal informed consent was obtained from the patient, patient was informed of potential risk of procedure, including bruising, bleeding, hematoma formation, infection, muscle weakness, muscle pain, numbness, among others.        MNC    Nerve / Sites Muscle Latency Ref. Amplitude Ref. Rel Amp Segments Distance Velocity Ref. Area    ms ms mV mV %  cm m/s m/s mVms  L Median - APB     Wrist APB 3.9 <=4.4 8.5 >=4.0 100 Wrist - APB 7   27.5     Upper arm APB 7.1  8.5  100 Upper arm - Wrist 22 69 >=49 30.2  L Ulnar -  ADM     Wrist ADM 2.9 <=3.3 10.8 >=6.0 100 Wrist - ADM 7   31.7     B.Elbow ADM 4.8  10.6  98.2 B.Elbow - Wrist 13 66 >=49 31.9     A.Elbow ADM 7.5  10.0  94.1 A.Elbow - B.Elbow 15 56 >=49 31.9  L Peroneal - EDB     Ankle EDB NR <=6.5 NR >=2.0 NR Ankle - EDB 9   NR         Pop fossa - Ankle      L Tibial - AH     Ankle AH 4.6 <=5.8 2.8 >=4.0 100 Ankle - AH 9   5.4     Pop fossa AH 12.1  1.2  43.9 Pop fossa - Ankle 40 53 >=41 3.2             SNC    Nerve / Sites Rec. Site Peak Lat Ref.  Amp Ref. Segments Distance    ms ms V V  cm  L Sural - Ankle (Calf)     Calf Ankle 3.2 <=4.4 3 >=6 Calf - Ankle 14  L Superficial peroneal - Ankle     Lat leg Ankle NR <=4.4 NR >=6 Lat leg - Ankle 14  L Median - Orthodromic (Dig II, Mid palm)     Dig II Wrist 2.9 <=3.4 15 >=10 Dig II - Wrist 13  L Ulnar - Orthodromic, (Dig V, Mid palm)     Dig V Wrist 2.4 <=3.1 7 >=5 Dig V - Wrist 11  F  Wave    Nerve F Lat Ref.   ms ms  L Median - APB 27.0 <=31.0  L Ulnar - ADM 29.1 <=32.0  L Tibial - AH 53.9 <=56.0           EMG Summary Table    Spontaneous MUAP Recruitment  Muscle IA Fib PSW Fasc Other Amp Dur. Poly Pattern  L. Tibialis anterior Normal None None None _______ Normal Normal Normal Normal  L. Peroneus longus Normal None None None _______ Normal Normal Normal Normal  L. Gastrocnemius (Medial head) Normal None None None _______ Normal Normal Normal Normal  L. Vastus lateralis Normal None None None _______ Normal Normal Normal Normal  L. Iliopsoas Normal None None None _______ Normal Normal Normal Normal  L. Lumbar paraspinals (low) Normal None None None _______ Normal Normal Normal Normal  L. Lumbar paraspinals (mid) Normal None None None _______ Normal Normal Normal Normal  L. Cervical paraspinals Normal None None None _______ Normal Normal Normal Normal  L. First dorsal interosseous Normal None None None _______ Normal Normal Normal Normal  L. Pronator teres Normal None None  None _______ Normal Normal Normal Normal  L. Biceps brachii Normal None None None _______ Normal Normal Normal Normal  L. Deltoid Normal None None None _______ Normal Normal Normal Normal  L. Triceps brachii Normal None None None _______ Normal Normal Normal Normal

## 2023-11-09 NOTE — Progress Notes (Signed)
 Chief Complaint  Patient presents with   NERVE CONDUCTION STUDY     Emgrm4, alone, NERVE CONDUCTION STUDY:    ASSESSMENT AND PLAN  Kimberly [name] is a 83 y.o. 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   MEDICAL HISTORY:  Kimberly [name], is a 83 year old female, referred by Dr. Janett Medin for evaluation of subacute onset lower extremity paresthesia, gait abnormality, her primary care is Dr. Bryon Caraway, Lesia Raring   History is obtained from the patient and review of electronic medical records. I personally reviewed pertinent available imaging films in PACS.   PMHx of  Paroxysmal atrial fibrillation on Eliquis  Lacunar infarction Hypothyroidism Depression anxiety Memory loss Gait abnormality  She lives alone since her husband passed away in 2022/11/23, still driving, mild cognitive impairment, Mini-Mental status examination in April 2025 was 21/30, but was able to give a clear history,  Since early 23-Nov-2023, she noticed bilateral feet numbness, initially starting at the left foot, quickly involving right foot, ascending to below knee level, intermittent, at the same time, she noticed gait abnormality, began to use walker for longer distance  She denies upper extremity involvement, complains of neck shoulder muscle tightness, denies bowel and bladder incontinence, she also concerned about lower  extremity edema,  She had a history of bilateral knee replacement many years ago, had left bunion surgery,  PHYSICAL EXAM:   Vitals:   11/09/23 1017  BP: 120/61  Pulse: 71  Resp: 17  SpO2: 93%     PHYSICAL EXAMNIATION:  Gen: NAD, conversant, well nourised, well groomed                     Cardiovascular: Regular rate rhythm, no peripheral edema, warm, nontender. Eyes: Conjunctivae clear without exudates or hemorrhage Neck: Supple, no carotid bruits. Pulmonary: Clear to auscultation bilaterally   NEUROLOGICAL EXAM:  MENTAL STATUS: Speech/cognition: Awake, alert, oriented to history taking and casual conversation CRANIAL NERVES: CN II: Visual fields are full to confrontation. Pupils are round equal and briskly reactive to light. CN III, IV, VI: extraocular movement are normal. No ptosis. CN V: Facial sensation is intact to light touch CN VII: Face is symmetric with normal eye closure  CN VIII: Hearing is normal to causal conversation. CN IX, X: Phonation is normal. CN XI: Head turning and shoulder shrug are intact  MOTOR: Bilateral hallus valgus, s/p left 1st toe surgery, no significant upper and lower extremity proximal and distal weakness  REFLEXES: Reflexes are brisk and symmetric at the biceps, triceps, trace at bilateral knees ( history of bilateral knee replacement), and trace at ankles. Plantar responses are extensor bilaterally  SENSORY: Mildly decreased vibratory sensation at toes, well-preserved pinprick  COORDINATION: There is no trunk or limb dysmetria noted.  GAIT/STANCE: Push-up to get up from seated position, stiff, cautious small stride  REVIEW OF SYSTEMS:  Full 14 system review of systems performed and notable only for as above All other review of systems were negative.   ALLERGIES:  Allergies  Allergen Reactions   Amoxicillin-Pot Clavulanate Nausea Only, Other (See Comments) and Swelling    Has patient had a PCN reaction causing immediate rash,  facial/tongue/throat swelling, SOB or lightheadedness with hypotension: No  Has patient had a PCN reaction causing severe rash involving mucus membranes or skin necrosis: No  Has patient had a PCN reaction that required hospitalization No  Has patient had a PCN reaction occurring within the last 10 years: No  If all of the above answers are NO, then may proceed with Cephalosporin use.  Other reaction(s): nausea  Other Reaction(s): GI Intolerance   Azithromycin Other (See Comments)    Pancreatitis  Other reaction(s): Other, pancreatitis  Other Reaction(s): Other (See Comments)  pancreatitis   Erythromycin Hives and Other (See Comments)    Reaction 1 time, when she was younger.  Other reaction(s): hives   Almond Oil Other (See Comments)    Almonds: per allergy testing.   Amoxicillin Nausea Only   Atorvastatin Other (See Comments)    Muscle weakness  Muscle weakness  Other reaction(s): muscle weakness, Other   Clavulanic Acid Nausea Only    Other Reaction(s): GI Intolerance   Codeine Nausea Only and Other (See Comments)    Other reaction(s): Nausea/Vomiting  Other Reaction(s): GI Intolerance   Ketoconazole Nausea And Vomiting   Ketoconazole Nausea And Vomiting, Nausea Only and Other (See Comments)    Other reaction(s): Unknown  Other Reaction(s): GI Intolerance   Latex Other (See Comments)    Other  Other Reaction(s): Other (See Comments)  Other,   Morphine  Nausea Only    Other reaction(s): Unknown  Other Reaction(s): GI Intolerance   Shellfish Allergy Other (See Comments)    Shellfish mix: per allergy testing.   Simvastatin Other (See Comments)    Muscle pain Other reaction(s): muscle pain, Other   Sulfamethoxazole-Trimethoprim Nausea Only    Upset stomach  Other Reaction(s): GI Intolerance   Clindamycin Rash   Clindamycin/Lincomycin Nausea And Vomiting and Rash   Erythromycin Base Rash    HOME MEDICATIONS: Current Outpatient Medications   Medication Sig Dispense Refill   acetaminophen  (TYLENOL ) 500 MG tablet Take 500 mg by mouth every 6 (six) hours as needed for moderate pain.     albuterol  (VENTOLIN  HFA) 108 (90 Base) MCG/ACT inhaler Inhale 2 puffs into the lungs every 6 (six) hours as needed for wheezing or shortness of breath. 18 g 3   ALPRAZolam  (XANAX ) 0.5 MG tablet Take 0.5 tablets (0.25 mg total) by mouth once as needed for up to 1 dose for anxiety. Take 30 minutes before MRI, may take an additional 1/2 pill if needed.  Do not drive after taking this medication. 1 tablet 0   azelastine  (ASTELIN ) 0.1 % nasal spray Place 2 sprays into both nostrils 2 (two) times daily as needed for rhinitis. Use in each nostril as directed 30 mL 5   CALTRATE 600+D3 SOFT 600-20 MG-MCG CHEW daily at 6 (six) AM.     Cholecalciferol 100 MCG (4000 UT) TABS Take by mouth.     cycloSPORINE  (RESTASIS ) 0.05 % ophthalmic emulsion Place 1 drop into both eyes 2 (two) times daily.     diltiazem  (CARDIZEM  CD) 240 MG 24 hr capsule Take 1 capsule (240 mg total) by mouth daily. 90 capsule 3   doxycycline  (VIBRAMYCIN ) 100 MG capsule Take 1 capsule (100 mg total) by mouth 2 (two) times daily for 10 days. 20 capsule 0   ELIQUIS  5 MG TABS tablet TAKE 1 TABLET BY MOUTH  TWICE A DAY 180 tablet 1   EPINEPHrine  0.3 mg/0.3 mL IJ SOAJ injection Inject 0.3 mg into the muscle as needed for anaphylaxis. 2 each 1   fluticasone  (FLONASE ) 50 MCG/ACT nasal spray PLACE 2 SPRAYS INTO BOTH NOSTRILS 2 (TWO) TIMES DAILY AS NEEDED FOR ALLERGIES. *DUE FOR OFFICE VISIT 16 mL 1   KLOR-CON  M10 10 MEQ tablet Take 10 mEq by mouth as needed (TAKE ONLY WHEN TAKE A LASIX ).     Levothyroxine  Sodium 112 MCG/ML SOLN Take 112 mcg by mouth every morning.     meclizine (ANTIVERT) 12.5 MG tablet Take 2 tablets by mouth as directed.     memantine  (NAMENDA  TITRATION PAK) tablet pack 5 mg/day for =1 week; 5 mg twice daily for =1 week; 15 mg/day given in 5 mg and 10 mg separated doses for =1 week; then  10 mg twice daily 49 tablet 12   memantine  (NAMENDA ) 10 MG tablet Take 1 tablet (10 mg total) by mouth 2 (two) times daily. 60 tablet 5   MIRALAX  17 GM/SCOOP powder Take 17 g by mouth as needed.     OLANZapine (ZYPREXA) 2.5 MG tablet Take 2.5 mg by mouth at bedtime.     Polyethyl Glycol-Propyl Glycol (SYSTANE) 0.4-0.3 % SOLN Apply 2 drops to eye daily as needed. 30 mL 5   rosuvastatin  (CRESTOR ) 10 MG tablet Take 5 mg by mouth at bedtime.  4   No current facility-administered medications for this visit.    PAST MEDICAL HISTORY: Past Medical History:  Diagnosis Date   Allergy    Anal polyp 1998   Flex Sig    Anemia    Angular blepharitis of left eye    Ankle fracture    Stress fracture   Anxiety    Aortic atherosclerosis (HCC)    Asthma    border line has inhaler   Bunion    Cataracts, bilateral    Corneal scar    left eye   CPAP (continuous positive airway pressure) dependence    Degenerative disc disease    Depression    Diverticulosis of colon (without mention of hemorrhage) 2010   Colonoscopy   Dry eyes    bilateral   Enterocele    External hemorrhoids 2000   Colonoscopy   Family history of malignant neoplasm of gastrointestinal tract    Female cystocele    Fibromyalgia    GERD (gastroesophageal reflux disease)    Hiatal hernia 2005,2010   EGD   History of bronchitis    History of measles    History of mumps    History of strep sore throat    History of urinary tract infection    Hyperlipemia    Hypothyroidism    IBS (irritable bowel syndrome)    Imbalance    Internal hemorrhoids without mention of complication 1995,2005   Colonoscopy    Itching    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HCC)    Menopause    Migraine    OSA (obstructive sleep apnea) 05/14/2015   on BiPAP   Osteopenia 10/2013   T score -1.6 FRAX 10%/1.6%   PAF (paroxysmal atrial fibrillation) (HCC)    Pancreatitis    PCO (posterior capsular opacification)    left   Pneumonia    childhood illness   PONV  (postoperative nausea and vomiting)    Pseudophakia, both eyes    PVD (posterior vitreous detachment) right   Rash    on back    Retinal scar    left  Rotator cuff disorder    pain, left shoulder   Shingles    Stricture and stenosis of esophagus 2005,2010   EGD    Stroke (HCC)    Ulcerative colitis (HCC)    Varicose veins    Vertigo    Wears glasses     PAST SURGICAL HISTORY: Past Surgical History:  Procedure Laterality Date   ABDOMINAL HYSTERECTOMY     ANAL FISSURE REPAIR     BREAST EXCISIONAL BIOPSY Left    BUNIONECTOMY  2014   CATARACT EXTRACTION Bilateral 2013   COLONOSCOPY     polyp removed   ESOPHAGEAL DILATION     EYE SURGERY Left    FOOT SURGERY     left    HEMORRHOID SURGERY     MOUTH SURGERY  11/13/11   Cyst removed from gum-benign    NASAL SEPTUM SURGERY     NASAL SEPTUM SURGERY  1970's   REPLACEMENT TOTAL KNEE Right 2011   TONSILLECTOMY     TOTAL ABDOMINAL HYSTERECTOMY W/ BILATERAL SALPINGOOPHORECTOMY  1991   TAH BSO   TOTAL HIP ARTHROPLASTY     right    TOTAL KNEE ARTHROPLASTY     both   TOTAL KNEE ARTHROPLASTY Left 06/23/2015   Procedure: LEFT TOTAL KNEE ARTHROPLASTY;  Surgeon: Liliane Rei, MD;  Location: [facility];  Service: Orthopedics;  Laterality: Left;    FAMILY HISTORY: Family History  Problem Relation Age of Onset   Dementia Mother    Irritable bowel syndrome Mother    Alzheimer's disease Mother    Stroke Mother    Hypertension Father    Congestive Heart Failure Father    Alcohol abuse Father    Diabetes Sister    Allergy (severe) Sister    Colon cancer Paternal Aunt    Heart disease Maternal Grandmother    Heart attack Maternal Grandmother    Heart attack Paternal Grandfather    Heart attack Maternal Grandfather    Arthritis Brother    Colon polyps Brother    Other Sister        pituitary disease   Allergies Sister    Other Sister        joint problems   Breast cancer Sister 59   Colon polyps Sister    Esophageal cancer  Neg Hx    Rectal cancer Neg Hx    Stomach cancer Neg Hx     SOCIAL HISTORY: Social History   Socioeconomic History   Marital status: Married    Spouse name: Not on file   Number of children: 1   Years of education: [email or web address]   Highest education level: Not on file  Occupational History   Occupation: Retired    Associate Professor: RETIRED  Tobacco Use   Smoking status: Former    Current packs/day: 0.00    Average packs/day: 0.3 packs/day for 15.0 years (3.8 ttl pk-yrs)    Types: Cigarettes    Start date: 05/31/1960    Quit date: 06/01/1975    Years since quitting: 48.4   Smokeless tobacco: Never   Tobacco comments:    Stopped smoking age 56  Vaping Use   Vaping status: Never Used  Substance and Sexual Activity   Alcohol use: No    Alcohol/week: 0.0 standard drinks of alcohol   Drug use: No   Sexual activity: Never    Birth control/protection: Surgical    Comment: HYST, first intercourse >16, sexual partner less than 5  Other Topics Concern   Not on file  Social History Narrative   Daily caffeine    Social Drivers of Corporate investment banker Strain: Not on file  Food Insecurity: Not on file  Transportation Needs: Not on file  Physical Activity: Not on file  Stress: Not on file  Social Connections: Not on file  Intimate Partner Violence: Not on file      Phebe Brasil, M.D. Ph.D.  [facility] [address] [place], [place] [address] Ph: [phone] Fax: [phone]  CC:  Jearldine Mina, MD [address] [place],  [place] [address]  Husain, Karrar, MD

## 2023-11-10 ENCOUNTER — Telehealth: Payer: Self-pay | Admitting: Neurology

## 2023-11-10 NOTE — Telephone Encounter (Addendum)
 Pt called again stating that she will have to drive herself to her appt in [place] because she has no one else to take her. She continued to state that she knows what has been advised but she does not think that something could be wrong and is insisting that she is fine to drive. She was given the number again to the [facility] office and she stated that she will call them to see what they offer and if they have someway to get her to the facility.

## 2023-11-10 NOTE — Telephone Encounter (Signed)
 Pt called again requesting to speak to the RN regarding the testing that she did yesterday. Please advise.

## 2023-11-16 ENCOUNTER — Encounter (HOSPITAL_BASED_OUTPATIENT_CLINIC_OR_DEPARTMENT_OTHER): Payer: Self-pay

## 2023-11-16 NOTE — Telephone Encounter (Signed)
 Pt called yet again claiming that no one is confirming to her that she can not drive. I explained again what the provider here had suggested and to call for the [facility] in [place]. Pt stated that she has tried them and could not get through so I told her to keep trying so that she can get an appt set up. Pt stated that she will.

## 2023-11-17 NOTE — Progress Notes (Unsigned)
 Cardiology Office Note    Patient Name: Kimberly [name] Date of Encounter: 11/18/2023  Primary Care Provider:  Jearldine Mina, MD Primary Cardiologist:  Gaylyn Keas, MD Primary Electrophysiologist: None   Past Medical History    Past Medical History:  Diagnosis Date   Allergy    Anal polyp 1998   Flex Sig    Anemia    Angular blepharitis of left eye    Ankle fracture    Stress fracture   Anxiety    Aortic atherosclerosis (HCC)    Asthma    border line has inhaler   Bunion    Cataracts, bilateral    Corneal scar    left eye   CPAP (continuous positive airway pressure) dependence    Degenerative disc disease    Depression    Diverticulosis of colon (without mention of hemorrhage) 2010   Colonoscopy   Dry eyes    bilateral   Enterocele    External hemorrhoids 2000   Colonoscopy   Family history of malignant neoplasm of gastrointestinal tract    Female cystocele    Fibromyalgia    GERD (gastroesophageal reflux disease)    Hiatal hernia 2005,2010   EGD   History of bronchitis    History of measles    History of mumps    History of strep sore throat    History of urinary tract infection    Hyperlipemia    Hypothyroidism    IBS (irritable bowel syndrome)    Imbalance    Internal hemorrhoids without mention of complication 1995,2005   Colonoscopy    Itching    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HCC)    Menopause    Migraine    OSA (obstructive sleep apnea) 05/14/2015   on BiPAP   Osteopenia 10/2013   T score -1.6 FRAX 10%/1.6%   PAF (paroxysmal atrial fibrillation) (HCC)    Pancreatitis    PCO (posterior capsular opacification)    left   Pneumonia    childhood illness   PONV (postoperative nausea and vomiting)    Pseudophakia, both eyes    PVD (posterior vitreous detachment) right   Rash    on back    Retinal scar    left   Rotator cuff disorder    pain, left shoulder   Shingles    Stricture and stenosis of esophagus 2005,2010   EGD    Stroke (HCC)     Ulcerative colitis (HCC)    Varicose veins    Vertigo    Wears glasses     History of Present Illness  Kimberly [name] is a 83 y.o. female with a PMH of PAF, fibromyalgia, HLD, OSA (on CPAP), asthma, HTN, hypothyroidism, RBBB, CVA, chronic lower extremity edema who presents today for posthospital follow-up.  Kimberly [name] has been followed by Dr. Micael Adas since 2016 for management of OSA.  She completed a stress Myoview  in 2014 chest pain that was normal.  She experienced syncope on 06/19/2017 subsequent chest pain that resolved with aspirin  and nitroglycerin .  She was evaluated in the ED for chest pain and palpitations with no ACS noted.  She wore an event monitor on 06/27/2017 with new diagnosis of A-fib with RVR noted.  She was seen in the AF clinic and started on beta-blocker and Eliquis .  She developed bradycardia and metoprolol  was decreased and subsequently discontinued.  She was seen in follow-up by Dr. Micael Adas on 11/2018 with complaint of intermittent chest pain and underwent coronary CTA that showed a calcium  score  of 0 no evidence of CAD.  She was evaluated in the ED with complaint of chest pain and found to have heart rate in the 200s with improvement of symptoms with nitroglycerin .  She had a 2D echo completed in 05/2021 EF of 60 to 65% and no RWMA.  She was seen for lower extremity edema with venous ultrasound that showed no DVT.  She was seen in the ED on 11/02/2023 with complaint of shortness of breath and swelling.  She endorsed symptoms lasting over the past 3 to 4 months with persistent cough with yellow sputum.  She had a trip and check that were normal and BNP was unremarkable with chest x-ray showing evidence of atypical infection and diagnosis of pneumonia.  She was discharged on doxycycline .  Kimberly [name] presents today for posthospital follow-up. She has been experiencing shortness of breath and swelling for the past three to four months. She has a cough with green-yellow sputum and was  diagnosed with an atypical infection after a chest x-ray in the emergency room. She was treated with doxycycline , which has helped, but she still experiences a mild cough. She completed the course of doxycycline  and is unsure if a refill is needed. She has a history of atrial fibrillation but has not experienced any recent palpitations or tachycardia. She was evaluated by a cardiologist and had an EEG in April, which was normal. She is currently taking Eliquis  and reports some bruising but no significant bleeding. She also takes Lasix  20 mg once daily and potassium supplements, which she dissolves in water  due to difficulty swallowing the pills. She experienced fatigue and congestion for a year before her pneumonia was identified. She believes the pneumonia contributed to her symptoms, which have improved since treatment.She is on Namenda  for memory issues but has not noticed a significant change in her memory. She is aware of potential side effects such as drowsiness and gastrointestinal upset but has not experienced these. She uses a CPAP machine but has not been consistent with it due to difficulty sleeping with it. She experiences fluid retention, particularly around her ankles. She has had knee surgery on both knees, which may contribute to the swelling. Patient denies chest pain, palpitations, dyspnea, PND, orthopnea, nausea, vomiting, dizziness, syncope, edema, weight gain, or early satiety.  Discussed the use of AI scribe software for clinical note transcription with the patient, who gave verbal consent to proceed.  History of Present Illness   Review of Systems  Please see the history of present illness.    All other systems reviewed and are otherwise negative except as noted above.  Physical Exam    Wt Readings from Last 3 Encounters:  11/18/23 165 lb 12.8 oz (75.2 kg)  11/02/23 156 lb 1.4 oz (70.8 kg)  09/28/23 156 lb (70.8 kg)   VS: Vitals:   11/18/23 1404  BP: 112/60  Pulse: 70   SpO2: 97%  ,Body mass index is 25.58 kg/m. GEN: Well nourished, well developed in no acute distress Neck: No JVD; No carotid bruits Pulmonary: Clear to auscultation without rales, wheezing or rhonchi  Cardiovascular: Normal rate. Regular rhythm. Normal S1. Normal S2.   Murmurs: There is no murmur.  ABDOMEN: Soft, non-tender, non-distended EXTREMITIES:  No edema; No deformity   EKG/LABS/ Recent Cardiac Studies   ECG personally reviewed by me today -none completed today  Risk Assessment/Calculations:    CHA2DS2-VASc Score = 7   This indicates a 11.2% annual risk of stroke. The patient's score is based upon:  CHF History: 0 HTN History: 1 Diabetes History: 0 Stroke History: 2 Vascular Disease History: 1 Age Score: 2 Gender Score: 1         Lab Results  Component Value Date   WBC 5.6 11/02/2023   HGB 11.6 (L) 11/02/2023   HCT 35.2 (L) 11/02/2023   MCV 95.9 11/02/2023   PLT 203 11/02/2023   Lab Results  Component Value Date   CREATININE 0.78 11/02/2023   BUN 27 (H) 11/02/2023   NA 139 11/02/2023   K 4.1 11/02/2023   CL 105 11/02/2023   CO2 25 11/02/2023   Lab Results  Component Value Date   CHOL 124 07/23/2013   HDL 68 07/23/2013   LDLCALC 42 07/23/2013   TRIG 69 07/23/2013   CHOLHDL 1.8 07/23/2013    No results found for: HGBA1C Assessment & Plan   Assessment & Plan  1.  Lower extremity edema: Persistent edema, more pronounced in one leg, possibly related to past knee surgery. - Elevate legs when sitting and wear compression socks when ambulatory. - Monitor salt intake and continue Lasix  20 mg daily and potassium 10 mEq  2.  Paroxysmal AF: Heart rate controlled, no recent palpitations or tachycardia. - Continue Cardizem  240 mg daily, Eliquis  5 mg twice daily  3.  History of OSA: - Patient reports intermittent compliance and is currently motivated to start using device nightly.  4.  Hyperlipidemia: - Patient's last LDL cholesterol was 64 -  Continue Crestor  10 mg daily  5.  Memory changes: No significant memory improvement on Namenda , discussed potential side effects, open to lifelong use if beneficial. - Continue Namenda . - Discuss memory changes and medication effects with neurologist at follow-up.  Disposition: Follow-up with Gaylyn Keas, MD as scheduled   Signed, Francene Ing, Retha Cast, NP 11/18/2023, 5:42 PM [facility]

## 2023-11-18 ENCOUNTER — Encounter: Payer: Self-pay | Admitting: Nurse Practitioner

## 2023-11-18 ENCOUNTER — Ambulatory Visit: Attending: Nurse Practitioner | Admitting: Nurse Practitioner

## 2023-11-18 VITALS — BP 112/60 | HR 70 | Ht 67.5 in | Wt 165.8 lb

## 2023-11-18 DIAGNOSIS — G4733 Obstructive sleep apnea (adult) (pediatric): Secondary | ICD-10-CM

## 2023-11-18 DIAGNOSIS — R413 Other amnesia: Secondary | ICD-10-CM | POA: Diagnosis not present

## 2023-11-18 DIAGNOSIS — I480 Paroxysmal atrial fibrillation: Secondary | ICD-10-CM | POA: Diagnosis not present

## 2023-11-18 DIAGNOSIS — M7989 Other specified soft tissue disorders: Secondary | ICD-10-CM | POA: Diagnosis not present

## 2023-11-18 DIAGNOSIS — E785 Hyperlipidemia, unspecified: Secondary | ICD-10-CM

## 2023-11-18 NOTE — Patient Instructions (Signed)
 Medication Instructions:  Your physician recommends that you continue on your current medications as directed. Please refer to the Current Medication list given to you today. *If you need a refill on your cardiac medications before your next appointment, please call your pharmacy*  Lab Work: NONE ORDERED If you have labs (blood work) drawn today and your tests are completely normal, you will receive your results only by: MyChart Message (if you have MyChart) OR A paper copy in the mail If you have any lab test that is abnormal or we need to change your treatment, we will call you to review the results.  Testing/Procedures: NONE ORDERED  Follow-Up: At [facility], you and your health needs are our priority.  As part of our continuing mission to provide you with exceptional heart care, our providers are all part of one team.  This team includes your primary Cardiologist (physician) and Advanced Practice Providers or APPs (Physician Assistants and Nurse Practitioners) who all work together to provide you with the care you need, when you need it.  Your next appointment:   FOLLOW UP AS SCHEDULED   Provider:   Gaylyn Keas, MD    We recommend signing up for the patient portal called MyChart.  Sign up information is provided on this After Visit Summary.  MyChart is used to connect with patients for Virtual Visits (Telemedicine).  Patients are able to view lab/test results, encounter notes, upcoming appointments, etc.  Non-urgent messages can be sent to your provider as well.   To learn more about what you can do with MyChart, go to [email or web address].   Other Instructions ELEVATE YOUR FEET AND WEAR TED HOSE

## 2023-11-21 ENCOUNTER — Ambulatory Visit: Attending: Neurology | Admitting: Physical Therapy

## 2023-11-21 ENCOUNTER — Telehealth: Payer: Self-pay | Admitting: Neurology

## 2023-11-21 ENCOUNTER — Other Ambulatory Visit: Payer: Self-pay

## 2023-11-21 DIAGNOSIS — R2689 Other abnormalities of gait and mobility: Secondary | ICD-10-CM | POA: Insufficient documentation

## 2023-11-21 DIAGNOSIS — R413 Other amnesia: Secondary | ICD-10-CM | POA: Insufficient documentation

## 2023-11-21 DIAGNOSIS — G629 Polyneuropathy, unspecified: Secondary | ICD-10-CM | POA: Diagnosis not present

## 2023-11-21 DIAGNOSIS — R269 Unspecified abnormalities of gait and mobility: Secondary | ICD-10-CM | POA: Insufficient documentation

## 2023-11-21 DIAGNOSIS — M6281 Muscle weakness (generalized): Secondary | ICD-10-CM | POA: Diagnosis not present

## 2023-11-21 DIAGNOSIS — R2681 Unsteadiness on feet: Secondary | ICD-10-CM | POA: Insufficient documentation

## 2023-11-21 NOTE — Telephone Encounter (Signed)
 Referral for occupational therapy fax to [facility] [employer]. Phone: [phone], Fax:[phone].

## 2023-11-21 NOTE — Therapy (Signed)
 OUTPATIENT PHYSICAL THERAPY NEURO EVALUATION   Patient Name: Kimberly [name] MRN: [id number] DOB:[birth year], 83 y.o., female Today's Date: 11/22/2023   PCP: Ransom Other, MD REFERRING PROVIDER: Onita Duos, MD   END OF SESSION:  PT End of Session - 11/22/23 1554     Visit Number 1    Number of Visits 16    Date for PT Re-Evaluation 01/13/24    Authorization Type [employer]-Auth submitted    PT Start Time 1450    PT Stop Time 1532    PT Time Calculation (min) 42 min    Activity Tolerance Patient tolerated treatment well    Behavior During Therapy WFL for tasks assessed/performed          Past Medical History:  Diagnosis Date   Allergy    Anal polyp 1998   Flex Sig    Anemia    Angular blepharitis of left eye    Ankle fracture    Stress fracture   Anxiety    Aortic atherosclerosis (HCC)    Asthma    border line has inhaler   Bunion    Cataracts, bilateral    Corneal scar    left eye   CPAP (continuous positive airway pressure) dependence    Degenerative disc disease    Depression    Diverticulosis of colon (without mention of hemorrhage) 2010   Colonoscopy   Dry eyes    bilateral   Enterocele    External hemorrhoids 2000   Colonoscopy   Family history of malignant neoplasm of gastrointestinal tract    Female cystocele    Fibromyalgia    GERD (gastroesophageal reflux disease)    Hiatal hernia 2005,2010   EGD   History of bronchitis    History of measles    History of mumps    History of strep sore throat    History of urinary tract infection    Hyperlipemia    Hypothyroidism    IBS (irritable bowel syndrome)    Imbalance    Internal hemorrhoids without mention of complication 1995,2005   Colonoscopy    Itching    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HCC)    Menopause    Migraine    OSA (obstructive sleep apnea) 05/14/2015   on BiPAP   Osteopenia 10/2013   T score -1.6 FRAX 10%/1.6%   PAF (paroxysmal atrial fibrillation) (HCC)    Pancreatitis    PCO  (posterior capsular opacification)    left   Pneumonia    childhood illness   PONV (postoperative nausea and vomiting)    Pseudophakia, both eyes    PVD (posterior vitreous detachment) right   Rash    on back    Retinal scar    left   Rotator cuff disorder    pain, left shoulder   Shingles    Stricture and stenosis of esophagus 2005,2010   EGD    Stroke (HCC)    Ulcerative colitis (HCC)    Varicose veins    Vertigo    Wears glasses    Past Surgical History:  Procedure Laterality Date   ABDOMINAL HYSTERECTOMY     ANAL FISSURE REPAIR     BREAST EXCISIONAL BIOPSY Left    BUNIONECTOMY  2014   CATARACT EXTRACTION Bilateral 2013   COLONOSCOPY     polyp removed   ESOPHAGEAL DILATION     EYE SURGERY Left    FOOT SURGERY     left    HEMORRHOID SURGERY     MOUTH SURGERY  11/13/11   Cyst removed from gum-benign    NASAL SEPTUM SURGERY     NASAL SEPTUM SURGERY  1970's   REPLACEMENT TOTAL KNEE Right 2011   TONSILLECTOMY     TOTAL ABDOMINAL HYSTERECTOMY W/ BILATERAL SALPINGOOPHORECTOMY  1991   TAH BSO   TOTAL HIP ARTHROPLASTY     right    TOTAL KNEE ARTHROPLASTY     both   TOTAL KNEE ARTHROPLASTY Left 06/23/2015   Procedure: LEFT TOTAL KNEE ARTHROPLASTY;  Surgeon: Dempsey Moan, MD;  Location: [facility];  Service: Orthopedics;  Laterality: Left;   Patient Active Problem List   Diagnosis Date Noted   Gait abnormality 11/09/2023   Neuropathy 11/09/2023   Memory loss 11/09/2023   Chronic venous insufficiency 10/19/2022   Bilateral tinnitus 11/08/2021   Elevated blood-pressure reading, without diagnosis of hypertension 03/13/2021   Microscopic hematuria 11/26/2020   Chronic pain 11/10/2020   Eosinophilic esophagitis 11/10/2020   Generalized anxiety disorder 11/10/2020   Hardening of the aorta (main artery of the heart) (HCC) 11/10/2020   Osteoporosis 11/10/2020   Spondylolisthesis at L4-L5 level 11/10/2020   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   Corns and callosities 05/20/2019    Pain in both feet 05/20/2019   Chest pain 01/28/2019   Chest pain of uncertain etiology 12/25/2018   Adiposity 07/13/2018   Excess weight 07/13/2018   Hay fever 07/13/2018   PAF (paroxysmal atrial fibrillation) (HCC) 08/25/2017   Neck pain 12/30/2016   Chronic low back pain 08/12/2016   Syncope 05/13/2016   Heart palpitations 05/13/2016   Rhinitis, chronic 02/04/2016   IBS (irritable bowel syndrome) 07/14/2015   Nausea without vomiting 07/14/2015   OA (osteoarthritis) of knee 06/23/2015   OSA (obstructive sleep apnea) 05/14/2015   Acute back pain with sciatica 04/03/2015   Preoperative clearance 01/08/2015   Acute infection of nasal sinus 02/25/2014   Pancreatitis 07/22/2013   Leukocytosis 07/22/2013   Amblyopia 03/07/2013   Retinal scar 03/07/2013   Cellulitis 01/15/2013   Cerebrovascular small vessel disease 11/08/2012   Angular blepharitis 10/11/2012   Dry eyes 10/11/2012   PCO (posterior capsular opacification) 10/11/2012   PVD (posterior vitreous detachment) 10/11/2012   Crossover toe 09/20/2012   Cataracts, bilateral    Elevated cholesterol    Degenerative disc disease    Bunion    Ankle fracture    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HCC)    Hemorrhoid    DUB (dysfunctional uterine bleeding)    DIARRHEA 09/16/2009   History of colonic polyps 09/16/2009   LACUNAR INFARCTION 07/16/2008   CONSTIPATION 07/12/2008   CHEST PAIN 07/12/2008   DYSPHAGIA 07/12/2008   Hypothyroidism 07/10/2008   Anxiety state 07/10/2008   DEPRESSION 07/10/2008   INTERNAL HEMORRHOIDS 07/10/2008   ESOPHAGEAL STRICTURE 07/10/2008   GERD 07/10/2008   HIATAL HERNIA 07/10/2008   DIVERTICULOSIS, COLON 07/10/2008   IRRITABLE BOWEL SYNDROME 07/10/2008   Osteoarthritis 07/10/2008   FIBROMYALGIA 07/10/2008   Blues 07/10/2008    ONSET DATE: 11/09/2023 (MD referral)  REFERRING DIAG:  R41.3 (ICD-10-CM) - Memory loss  G62.9 (ICD-10-CM) - Neuropathy  R26.9 (ICD-10-CM) - Gait abnormality    THERAPY DIAG:   Unsteadiness on feet  Other abnormalities of gait and mobility  Muscle weakness (generalized)  Rationale for Evaluation and Treatment: Rehabilitation  SUBJECTIVE:  SUBJECTIVE STATEMENT: Recently, my ankles and legs seem to be a little weaker.  Use the walker sometimes, especially when I'm outdoors and have to navigate parking lot. Gradually over the past 1-2 years that I've noticed the weakness. Pt accompanied by: self  PERTINENT HISTORY: Dr. Georgianne note:  EMG limited due to swelling; worry about cervical spondylitic myelopathy-MRI ordered PMH of a-fib on Eliquis , lacunar infarction, memory loss, hypothyroidism, walking pneumonia; pt reported hx of Bilat TKR  PAIN:  Are you having pain? Yes: NPRS scale: 3-4/10 Pain location: BLEs Pain description: deep, muscular ache Aggravating factors: being on feet too long Relieving factors: heat helps, elevating legs  PRECAUTIONS: Fall  RED FLAGS: None   WEIGHT BEARING RESTRICTIONS: No  FALLS: Has patient fallen in last 6 months? No  LIVING ENVIRONMENT: Lives with: lives alone and family lives nearby Lives in: House/apartment Stairs: 2 steps to enter Has following equipment at home: Single point cane and Environmental consultant - 2 wheeled  PLOF: Independent  PATIENT GOALS: Feel safer, feel stronger with legs  OBJECTIVE:  Note: Objective measures were completed at Evaluation unless otherwise noted.  DIAGNOSTIC FINDINGS: awaiting MRI  COGNITION: Overall cognitive status: Within functional limits for tasks assessed and History of cognitive impairments - at baseline   SENSATION: Light touch: Impaired  more distally  EDEMA:  +1 pitting edema BLEs, LLE > RLE  MUSCLE TONE: Generally stiff with P/ROM in BLEs   POSTURE: rounded shoulders and forward  head  LOWER EXTREMITY ROM:     Active  Right Eval Left Eval  Hip flexion    Hip extension    Hip abduction    Hip adduction    Hip internal rotation    Hip external rotation    Knee flexion    Knee extension -10 -20  Ankle dorsiflexion +10 + 2  Ankle plantarflexion    Ankle inversion    Ankle eversion     (Blank rows = not tested)  LOWER EXTREMITY MMT:    MMT Right Eval Left Eval  Hip flexion 4 4  Hip extension    Hip abduction 4 4  Hip adduction 4+ 4+  Hip internal rotation    Hip external rotation    Knee flexion 4 4  Knee extension 4 4  Ankle dorsiflexion 3+ 3+  Ankle plantarflexion    Ankle inversion    Ankle eversion    (Blank rows = not tested)  TRANSFERS: Sit to stand: Modified independence  Assistive device utilized: None     Stand to sit: Modified independence  Assistive device utilized: None       GAIT: Findings: Gait Characteristics: step through pattern, Distance walked: 50 ft x 2 , Assistive device utilized:Walker - 2 wheeled, and Level of assistance: SBA  FUNCTIONAL TESTS:  5 times sit to stand: 24.47 sec with UE support Timed up and go (TUG): 28.16 sec RW; 20.53 sec  10 meter walk test: 20.53 sec (1.6 ft/sec) Berg Balance:  TBA EO feet together:  30 sec mild sway EC feet together 20 sec moderate sway *Pt c/o sciatica type pain in RLE/piriformis area with short distance gait.  TREATMENT DATE: 11/21/2023    PATIENT EDUCATION: Education details: Eval results, POC; answered pt's questions and discussed scope of therapy in regards to neuropathy; HEP initiated Person educated: Patient Education method: Explanation, Demonstration, and Handouts Education comprehension: verbalized understanding and returned demonstration  HOME EXERCISE PROGRAM: Access Code: [email or web address] URL: [email or web address] Date:  11/21/2023 Prepared by: [facility] - Outpatient  Rehab - [facility]  Exercises - Seated Ankle Pumps  - 1 x daily - 7 x weekly - 3 sets - 10 reps - Seated Long Arc Quad  - 1 x daily - 7 x weekly - 3 sets - 10 reps  GOALS: Goals reviewed with patient? Yes  SHORT TERM GOALS: Target date: 12/16/2023  Pt will be independent with HEP for improved balance, strength, gait. Baseline: Goal status: INITIAL  2.  Pt will improve 5x sit<>stand to less than or equal to 18 sec with UE support to demonstrate improved functional strength and transfer efficiency. Baseline: 24.47 sec with BUE support Goal status: INITIAL  3.  Pt will improve TUG score to less than or equal to 20 sec with RW for decreased fall risk. Baseline: 28 sec Goal status: INITIAL  LONG TERM GOALS: Target date: 01/13/2024  Pt will be independent with HEP for improved strength, balance, gait. Baseline:  Goal status: INITIAL  2.  Pt will improve 5x sit<>stand to less than or equal to 15 sec with light to no UE support to demonstrate improved functional strength and transfer efficiency. Baseline: 24.47 sec with UE support Goal status: INITIAL  3.  Pt will improve TUG score to less than or equal to 15 sec with LRAD for decreased fall risk. Baseline:  Goal status: INITIAL  4.  Pt will improve Berg score to at least 45/56 to decrease fall risk. Baseline: TBA Goal status: INITIAL  5.  Pt will verbalize understanding of fall prevention in home environment.  Baseline:  Goal status: INITIAL  6.  Pt will improve gait velocity to at least 2 ft/sec for improved gait efficiency and safety.  Baseline:  Goal status:  INITIAL  ASSESSMENT:  CLINICAL IMPRESSION: Patient is a 82 y.o. female who was seen today for physical therapy evaluation and treatment for neuropathy, gait abnormality. She has had recent hx of walking pneumonia, along with hx of neuropathy.  She presents to OPPT with decreased balance, decreased BLE strength,  decreased independence with gait.  She is at increased fall risk per FTSTS, TUG, and gait velocity scores.  She would benefit from skilled PT to address the above stated deficits to improve overall functional mobility and decrease fall risk.  OBJECTIVE IMPAIRMENTS: Abnormal gait, decreased balance, decreased mobility, difficulty walking, and decreased strength.   ACTIVITY LIMITATIONS: standing, transfers, and locomotion level  PARTICIPATION LIMITATIONS: meal prep, cleaning, laundry, and community activity  PERSONAL FACTORS: 3+ comorbidities: see above PMH are also affecting patient's functional outcome.   REHAB POTENTIAL: Good  CLINICAL DECISION MAKING: Evolving/moderate complexity  EVALUATION COMPLEXITY: Moderate  PLAN:  PT FREQUENCY: 2x/week  PT DURATION: 8 weeks including eval week  PLANNED INTERVENTIONS: 97750- Physical Performance Testing, 97110-Therapeutic exercises, 97530- Therapeutic activity, [id number]- Neuromuscular re-education, 97535- Self Care, [id number]- Manual therapy, [phone]- Gait training, Patient/Family education, and Balance training  PLAN FOR NEXT SESSION: Review initial HEP, Assess Berg; further assess pt's reports of sciatic pain.  Work on balance and [employer]., PT 11/22/2023, 3:57 PM   [facility] [address] [place], [address] [place], [place]  [address] Phone # [phone] Fax # [phone]   Referring diagnosis?  R41.3 (ICD-10-CM) - Memory loss  G62.9 (ICD-10-CM) - Neuropathy  R26.9 (ICD-10-CM) - Gait abnormality   Treatment diagnosis? (if different than referring diagnosis) R26.81, R26.89, M62.81 What was this (referring dx) caused by? []  Surgery []  Fall [x]  Ongoing issue []  Arthritis []  Other: ____________  Laterality: []  Rt []  Lt [x]  Both  Check all possible CPT codes:  *CHOOSE 10 OR LESS*    See Planned Interventions listed in the Plan section of the Evaluation.

## 2023-11-23 ENCOUNTER — Encounter: Payer: Self-pay | Admitting: Physical Therapy

## 2023-11-23 ENCOUNTER — Ambulatory Visit: Admitting: Physical Therapy

## 2023-11-23 DIAGNOSIS — R2681 Unsteadiness on feet: Secondary | ICD-10-CM | POA: Diagnosis not present

## 2023-11-23 DIAGNOSIS — R2689 Other abnormalities of gait and mobility: Secondary | ICD-10-CM | POA: Diagnosis not present

## 2023-11-23 DIAGNOSIS — R269 Unspecified abnormalities of gait and mobility: Secondary | ICD-10-CM | POA: Diagnosis not present

## 2023-11-23 DIAGNOSIS — M6281 Muscle weakness (generalized): Secondary | ICD-10-CM | POA: Diagnosis not present

## 2023-11-23 DIAGNOSIS — R413 Other amnesia: Secondary | ICD-10-CM | POA: Diagnosis not present

## 2023-11-23 DIAGNOSIS — G629 Polyneuropathy, unspecified: Secondary | ICD-10-CM | POA: Diagnosis not present

## 2023-11-23 NOTE — Therapy (Signed)
 OUTPATIENT PHYSICAL THERAPY NEURO TREATMENT   Patient Name: Kimberly [name] MRN: [id number] DOB:[birth year], 83 y.o., female Today's Date: 11/23/2023   PCP: Ransom Other, MD REFERRING PROVIDER: Onita Duos, MD   END OF SESSION:  PT End of Session - 11/23/23 1538     Visit Number 2    Number of Visits 16    Date for PT Re-Evaluation 01/13/24    Authorization Type [employer] Medicare-approved 16 PT visits from 11/21/2023-01/13/2024    Authorization - Visit Number 2    Authorization - Number of Visits 16    Progress Note Due on Visit 10    PT Start Time 1535    PT Stop Time 1613    PT Time Calculation (min) 38 min    Activity Tolerance Patient tolerated treatment well    Behavior During Therapy WFL for tasks assessed/performed           Past Medical History:  Diagnosis Date   Allergy    Anal polyp 1998   Flex Sig    Anemia    Angular blepharitis of left eye    Ankle fracture    Stress fracture   Anxiety    Aortic atherosclerosis (HCC)    Asthma    border line has inhaler   Bunion    Cataracts, bilateral    Corneal scar    left eye   CPAP (continuous positive airway pressure) dependence    Degenerative disc disease    Depression    Diverticulosis of colon (without mention of hemorrhage) 2010   Colonoscopy   Dry eyes    bilateral   Enterocele    External hemorrhoids 2000   Colonoscopy   Family history of malignant neoplasm of gastrointestinal tract    Female cystocele    Fibromyalgia    GERD (gastroesophageal reflux disease)    Hiatal hernia 2005,2010   EGD   History of bronchitis    History of measles    History of mumps    History of strep sore throat    History of urinary tract infection    Hyperlipemia    Hypothyroidism    IBS (irritable bowel syndrome)    Imbalance    Internal hemorrhoids without mention of complication 1995,2005   Colonoscopy    Itching    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HCC)    Menopause    Migraine    OSA (obstructive sleep apnea)  05/14/2015   on BiPAP   Osteopenia 10/2013   T score -1.6 FRAX 10%/1.6%   PAF (paroxysmal atrial fibrillation) (HCC)    Pancreatitis    PCO (posterior capsular opacification)    left   Pneumonia    childhood illness   PONV (postoperative nausea and vomiting)    Pseudophakia, both eyes    PVD (posterior vitreous detachment) right   Rash    on back    Retinal scar    left   Rotator cuff disorder    pain, left shoulder   Shingles    Stricture and stenosis of esophagus 2005,2010   EGD    Stroke (HCC)    Ulcerative colitis (HCC)    Varicose veins    Vertigo    Wears glasses    Past Surgical History:  Procedure Laterality Date   ABDOMINAL HYSTERECTOMY     ANAL FISSURE REPAIR     BREAST EXCISIONAL BIOPSY Left    BUNIONECTOMY  2014   CATARACT EXTRACTION Bilateral 2013   COLONOSCOPY     polyp removed  ESOPHAGEAL DILATION     EYE SURGERY Left    FOOT SURGERY     left    HEMORRHOID SURGERY     MOUTH SURGERY  11/13/11   Cyst removed from gum-benign    NASAL SEPTUM SURGERY     NASAL SEPTUM SURGERY  1970's   REPLACEMENT TOTAL KNEE Right 2011   TONSILLECTOMY     TOTAL ABDOMINAL HYSTERECTOMY W/ BILATERAL SALPINGOOPHORECTOMY  1991   TAH BSO   TOTAL HIP ARTHROPLASTY     right    TOTAL KNEE ARTHROPLASTY     both   TOTAL KNEE ARTHROPLASTY Left 06/23/2015   Procedure: LEFT TOTAL KNEE ARTHROPLASTY;  Surgeon: Dempsey Moan, MD;  Location: [facility];  Service: Orthopedics;  Laterality: Left;   Patient Active Problem List   Diagnosis Date Noted   Gait abnormality 11/09/2023   Neuropathy 11/09/2023   Memory loss 11/09/2023   Chronic venous insufficiency 10/19/2022   Bilateral tinnitus 11/08/2021   Elevated blood-pressure reading, without diagnosis of hypertension 03/13/2021   Microscopic hematuria 11/26/2020   Chronic pain 11/10/2020   Eosinophilic esophagitis 11/10/2020   Generalized anxiety disorder 11/10/2020   Hardening of the aorta (main artery of the heart) (HCC)  11/10/2020   Osteoporosis 11/10/2020   Spondylolisthesis at L4-L5 level 11/10/2020   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   Corns and callosities 05/20/2019   Pain in both feet 05/20/2019   Chest pain 01/28/2019   Chest pain of uncertain etiology 12/25/2018   Adiposity 07/13/2018   Excess weight 07/13/2018   Hay fever 07/13/2018   PAF (paroxysmal atrial fibrillation) (HCC) 08/25/2017   Neck pain 12/30/2016   Chronic low back pain 08/12/2016   Syncope 05/13/2016   Heart palpitations 05/13/2016   Rhinitis, chronic 02/04/2016   IBS (irritable bowel syndrome) 07/14/2015   Nausea without vomiting 07/14/2015   OA (osteoarthritis) of knee 06/23/2015   OSA (obstructive sleep apnea) 05/14/2015   Acute back pain with sciatica 04/03/2015   Preoperative clearance 01/08/2015   Acute infection of nasal sinus 02/25/2014   Pancreatitis 07/22/2013   Leukocytosis 07/22/2013   Amblyopia 03/07/2013   Retinal scar 03/07/2013   Cellulitis 01/15/2013   Cerebrovascular small vessel disease 11/08/2012   Angular blepharitis 10/11/2012   Dry eyes 10/11/2012   PCO (posterior capsular opacification) 10/11/2012   PVD (posterior vitreous detachment) 10/11/2012   Crossover toe 09/20/2012   Cataracts, bilateral    Elevated cholesterol    Degenerative disc disease    Bunion    Ankle fracture    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HCC)    Hemorrhoid    DUB (dysfunctional uterine bleeding)    DIARRHEA 09/16/2009   History of colonic polyps 09/16/2009   LACUNAR INFARCTION 07/16/2008   CONSTIPATION 07/12/2008   CHEST PAIN 07/12/2008   DYSPHAGIA 07/12/2008   Hypothyroidism 07/10/2008   Anxiety state 07/10/2008   DEPRESSION 07/10/2008   INTERNAL HEMORRHOIDS 07/10/2008   ESOPHAGEAL STRICTURE 07/10/2008   GERD 07/10/2008   HIATAL HERNIA 07/10/2008   DIVERTICULOSIS, COLON 07/10/2008   IRRITABLE BOWEL SYNDROME 07/10/2008   Osteoarthritis 07/10/2008   FIBROMYALGIA 07/10/2008   Blues 07/10/2008    ONSET DATE: 11/09/2023 (MD  referral)  REFERRING DIAG:  R41.3 (ICD-10-CM) - Memory loss  G62.9 (ICD-10-CM) - Neuropathy  R26.9 (ICD-10-CM) - Gait abnormality    THERAPY DIAG:  Unsteadiness on feet  Other abnormalities of gait and mobility  Rationale for Evaluation and Treatment: Rehabilitation  SUBJECTIVE:  SUBJECTIVE STATEMENT: Just having some soreness in my feet when I'm up, so I feel I'm relying on the walker more.  I have finished the antibiotics, but I have started having some chills in the past couple of days.  No fever. Pt accompanied by: self  PERTINENT HISTORY: Dr. Georgianne note:  EMG limited due to swelling; worry about cervical spondylitic myelopathy-MRI ordered PMH of a-fib on Eliquis , lacunar infarction, memory loss, hypothyroidism, walking pneumonia; pt reported hx of Bilat TKR  PAIN:  Are you having pain? Yes: NPRS scale: 4/10 Pain location: BLEs Pain description: deep, muscular ache Aggravating factors: being on feet too long Relieving factors: heat helps, elevating legs  PRECAUTIONS: Fall  RED FLAGS: None   WEIGHT BEARING RESTRICTIONS: No  FALLS: Has patient fallen in last 6 months? No  LIVING ENVIRONMENT: Lives with: lives alone and family lives nearby Lives in: House/apartment Stairs: 2 steps to enter Has following equipment at home: Single point cane and Environmental consultant - 2 wheeled  PLOF: Independent  PATIENT GOALS: Feel safer, feel stronger with legs  OBJECTIVE:    TODAY'S TREATMENT: 11/23/2023 Activity Comments  Vitals O2 sats 97%, HR 66 bpm 148/84   Berg Balance:  31/56 Scores <45/56 indicate increased fall risk  Standing repeated flexion  Standing repeated extension Comfortable with flexion, pain returns upon standing Decreases pain   Seated piriformis stretch, 3 x 15 sec Cues for  technique; some relief with stretch; pain returns upon completion   Seated ankle pumps Standing stagger stance rocking Cues for technique  Sit to stand x 5 reps Cues for wider foot position and for technique to lessen reliance on Ues; still needs significant UE support    [facility] Adult PT Treatment/Exercise - 11/23/23 1715       Standardized Balance Assessment   Standardized Balance Assessment Berg Balance Test      Berg Balance Test   Sit to Stand Able to stand  independently using hands    Standing Unsupported Able to stand 2 minutes with supervision    Sitting with Back Unsupported but Feet Supported on Floor or Stool Able to sit safely and securely 2 minutes    Stand to Sit Controls descent by using hands    Transfers Able to transfer safely, definite need of hands    Standing Unsupported with Eyes Closed Able to stand 10 seconds with supervision    Standing Ubsupported with Feet Together Needs help to attain position but able to stand for 30 seconds with feet together    From Standing, Reach Forward with Outstretched Arm Can reach forward >12 cm safely (5)    From Standing Position, Pick up Object from Floor Able to pick up shoe, needs supervision    From Standing Position, Turn to Look Behind Over each Shoulder Turn sideways only but maintains balance    Turn 360 Degrees Needs close supervision or verbal cueing    Standing Unsupported, Alternately Place Feet on Step/Stool Able to complete >2 steps/needs minimal assist    Standing Unsupported, One Foot in Front Needs help to step but can hold 15 seconds    Standing on One Leg Unable to try or needs assist to prevent fall    Total Score 31         Access Code: [email or web address] URL: [email or web address] Date: 11/23/2023 Prepared by: [facility] - Outpatient  Rehab - [facility]  Exercises - Seated Heel Toe Raises  - 1 x daily - 7 x weekly - 3 sets - 10  reps - Staggered Stance Forward Backward Weight Shift with Counter  Support  - 1 x daily - 7 x weekly - 1-2 sets - 10 reps  PATIENT EDUCATION: Education details: HEP, Berg results and use RW for safety Person educated: Patient Education method: Explanation, Demonstration, Verbal cues, and Handouts Education comprehension: verbalized understanding, returned demonstration, and needs further education   ---------------------------------------------------- Note: Objective measures were completed at Evaluation unless otherwise noted.  DIAGNOSTIC FINDINGS: awaiting MRI  COGNITION: Overall cognitive status: Within functional limits for tasks assessed and History of cognitive impairments - at baseline   SENSATION: Light touch: Impaired  more distally  EDEMA:  +1 pitting edema BLEs, LLE > RLE  MUSCLE TONE: Generally stiff with P/ROM in BLEs   POSTURE: rounded shoulders and forward head  LOWER EXTREMITY ROM:     Active  Right Eval Left Eval  Hip flexion    Hip extension    Hip abduction    Hip adduction    Hip internal rotation    Hip external rotation    Knee flexion    Knee extension -10 -20  Ankle dorsiflexion +10 + 2  Ankle plantarflexion    Ankle inversion    Ankle eversion     (Blank rows = not tested)  LOWER EXTREMITY MMT:    MMT Right Eval Left Eval  Hip flexion 4 4  Hip extension    Hip abduction 4 4  Hip adduction 4+ 4+  Hip internal rotation    Hip external rotation    Knee flexion 4 4  Knee extension 4 4  Ankle dorsiflexion 3+ 3+  Ankle plantarflexion    Ankle inversion    Ankle eversion    (Blank rows = not tested)  TRANSFERS: Sit to stand: Modified independence  Assistive device utilized: None     Stand to sit: Modified independence  Assistive device utilized: None       GAIT: Findings: Gait Characteristics: step through pattern, Distance walked: 50 ft x 2 , Assistive device utilized:Walker - 2 wheeled, and Level of assistance: SBA  FUNCTIONAL TESTS:  5 times sit to stand: 24.47 sec with UE support Timed  up and go (TUG): 28.16 sec RW; 20.53 sec  10 meter walk test: 20.53 sec (1.6 ft/sec) Berg Balance:  TBA EO feet together:  30 sec mild sway EC feet together 20 sec moderate sway *Pt c/o sciatica type pain in RLE/piriformis area with short distance gait.                                                                                                                              TREATMENT DATE: 11/21/2023    PATIENT EDUCATION: Education details: Eval results, POC; answered pt's questions and discussed scope of therapy in regards to neuropathy; HEP initiated Person educated: Patient Education method: Explanation, Demonstration, and Handouts Education comprehension: verbalized understanding and returned demonstration  HOME EXERCISE PROGRAM: Access Code: [email or web address] URL: [email or web address] Date: 11/21/2023 Prepared  by: [facility] - Outpatient  Rehab - [facility]  Exercises - Seated Ankle Pumps  - 1 x daily - 7 x weekly - 3 sets - 10 reps - Seated Long Arc Quad  - 1 x daily - 7 x weekly - 3 sets - 10 reps  GOALS: Goals reviewed with patient? Yes  SHORT TERM GOALS: Target date: 12/16/2023  Pt will be independent with HEP for improved balance, strength, gait. Baseline: Goal status: INITIAL  2.  Pt will improve 5x sit<>stand to less than or equal to 18 sec with UE support to demonstrate improved functional strength and transfer efficiency. Baseline: 24.47 sec with BUE support Goal status: INITIAL  3.  Pt will improve TUG score to less than or equal to 20 sec with RW for decreased fall risk. Baseline: 28 sec Goal status: INITIAL  LONG TERM GOALS: Target date: 01/13/2024  Pt will be independent with HEP for improved strength, balance, gait. Baseline:  Goal status: INITIAL  2.  Pt will improve 5x sit<>stand to less than or equal to 15 sec with light to no UE support to demonstrate improved functional strength and transfer efficiency. Baseline: 24.47 sec with UE  support Goal status: INITIAL  3.  Pt will improve TUG score to less than or equal to 15 sec with LRAD for decreased fall risk. Baseline:  Goal status: INITIAL  4.  Pt will improve Berg score to at least 45/56 to decrease fall risk. Baseline: TBA Goal status: INITIAL  5.  Pt will verbalize understanding of fall prevention in home environment.  Baseline:  Goal status: INITIAL  6.  Pt will improve gait velocity to at least 2 ft/sec for improved gait efficiency and safety.  Baseline:  Goal status:  INITIAL  ASSESSMENT:  CLINICAL IMPRESSION: Pt presents today with continued reports of pain in bilateral feet. Skilled PT session focused on assessing Berg Balance test, with pt's score of 31/56 indicating increased risk of falls and RW typically the safest assistive device at this time.  Initiated HEP to include ankle pumps for flexibility and rocking in standing for active ankle motion.  Pt reports these exercises feel good and safe to do at home.  Looked briefly at pain, with pt reporting pain in buttocks with sitting; relieved by piriformis stretch, but comes back when stretch is released.  Will plan to continue to build upon HEP to address flexibility, strength, and balance for improved overall functional mobility and decreased fall risk.   OBJECTIVE IMPAIRMENTS: Abnormal gait, decreased balance, decreased mobility, difficulty walking, and decreased strength.   ACTIVITY LIMITATIONS: standing, transfers, and locomotion level  PARTICIPATION LIMITATIONS: meal prep, cleaning, laundry, and community activity  PERSONAL FACTORS: 3+ comorbidities: see above PMH are also affecting patient's functional outcome.   REHAB POTENTIAL: Good  CLINICAL DECISION MAKING: Evolving/moderate complexity  EVALUATION COMPLEXITY: Moderate  PLAN:  PT FREQUENCY: 2x/week  PT DURATION: 8 weeks including eval week  PLANNED INTERVENTIONS: 97750- Physical Performance Testing, 97110-Therapeutic exercises, 97530-  Therapeutic activity, [id number]- Neuromuscular re-education, 97535- Self Care, 02859- Manual therapy, [phone]- Gait training, Patient/Family education, and Balance training  PLAN FOR NEXT SESSION: *Consolidate both HEPs and review/progress.   Work on balance and strength   Zakirah Weingart W., PT 11/23/2023, 5:22 PM  [facility] at [facility] [address] [place], [address] [place], [place] [address] Phone # [phone] Fax # [phone]    [facility] at [facility] [address] [place], [address] [place], [place]  [address] Phone # [phone] Fax # [phone]   Referring diagnosis?  R41.3 (ICD-10-CM) - Memory loss  G62.9 (ICD-10-CM) - Neuropathy  R26.9 (ICD-10-CM) - Gait abnormality   Treatment diagnosis? (if different than referring diagnosis) R26.81, R26.89, M62.81 What was this (referring dx) caused by? []  Surgery []  Fall [x]  Ongoing issue []  Arthritis []  Other: ____________  Laterality: []  Rt []  Lt [x]  Both  Check all possible CPT codes:  *CHOOSE 10 OR LESS*    See Planned Interventions listed in the Plan section of the Evaluation.

## 2023-11-30 ENCOUNTER — Other Ambulatory Visit (HOSPITAL_COMMUNITY): Payer: Self-pay

## 2023-11-30 NOTE — Therapy (Unsigned)
 OUTPATIENT PHYSICAL THERAPY NEURO TREATMENT   Patient Name: Kimberly [name] MRN: [id number] DOB:1941/04/04, 83 y.o., female Today's Date: 12/01/2023   PCP: Ransom Other, MD REFERRING PROVIDER: Onita Duos, MD   END OF SESSION:  PT End of Session - 12/01/23 1451     Visit Number 3    Number of Visits 16    Date for PT Re-Evaluation 01/13/24    Authorization Type [employer] Medicare-approved 16 PT visits from 11/21/2023-01/13/2024    Authorization - Number of Visits 16    Progress Note Due on Visit 10    PT Start Time 1452    PT Stop Time 1532    PT Time Calculation (min) 40 min    Activity Tolerance Patient tolerated treatment well    Behavior During Therapy WFL for tasks assessed/performed            Past Medical History:  Diagnosis Date   Allergy    Anal polyp 1998   Flex Sig    Anemia    Angular blepharitis of left eye    Ankle fracture    Stress fracture   Anxiety    Aortic atherosclerosis (HCC)    Asthma    border line has inhaler   Bunion    Cataracts, bilateral    Corneal scar    left eye   CPAP (continuous positive airway pressure) dependence    Degenerative disc disease    Depression    Diverticulosis of colon (without mention of hemorrhage) 2010   Colonoscopy   Dry eyes    bilateral   Enterocele    External hemorrhoids 2000   Colonoscopy   Family history of malignant neoplasm of gastrointestinal tract    Female cystocele    Fibromyalgia    GERD (gastroesophageal reflux disease)    Hiatal hernia 2005,2010   EGD   History of bronchitis    History of measles    History of mumps    History of strep sore throat    History of urinary tract infection    Hyperlipemia    Hypothyroidism    IBS (irritable bowel syndrome)    Imbalance    Internal hemorrhoids without mention of complication 1995,2005   Colonoscopy    Itching    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HCC)    Menopause    Migraine    OSA (obstructive sleep apnea) 05/14/2015   on BiPAP    Osteopenia 10/2013   T score -1.6 FRAX 10%/1.6%   PAF (paroxysmal atrial fibrillation) (HCC)    Pancreatitis    PCO (posterior capsular opacification)    left   Pneumonia    childhood illness   PONV (postoperative nausea and vomiting)    Pseudophakia, both eyes    PVD (posterior vitreous detachment) right   Rash    on back    Retinal scar    left   Rotator cuff disorder    pain, left shoulder   Shingles    Stricture and stenosis of esophagus 2005,2010   EGD    Stroke (HCC)    Ulcerative colitis (HCC)    Varicose veins    Vertigo    Wears glasses    Past Surgical History:  Procedure Laterality Date   ABDOMINAL HYSTERECTOMY     ANAL FISSURE REPAIR     BREAST EXCISIONAL BIOPSY Left    BUNIONECTOMY  2014   CATARACT EXTRACTION Bilateral 2013   COLONOSCOPY     polyp removed   ESOPHAGEAL DILATION  EYE SURGERY Left    FOOT SURGERY     left    HEMORRHOID SURGERY     MOUTH SURGERY  11/13/11   Cyst removed from gum-benign    NASAL SEPTUM SURGERY     NASAL SEPTUM SURGERY  1970's   REPLACEMENT TOTAL KNEE Right 2011   TONSILLECTOMY     TOTAL ABDOMINAL HYSTERECTOMY W/ BILATERAL SALPINGOOPHORECTOMY  1991   TAH BSO   TOTAL HIP ARTHROPLASTY     right    TOTAL KNEE ARTHROPLASTY     both   TOTAL KNEE ARTHROPLASTY Left 06/23/2015   Procedure: LEFT TOTAL KNEE ARTHROPLASTY;  Surgeon: Dempsey Moan, MD;  Location: [facility];  Service: Orthopedics;  Laterality: Left;   Patient Active Problem List   Diagnosis Date Noted   Gait abnormality 11/09/2023   Neuropathy 11/09/2023   Memory loss 11/09/2023   Chronic venous insufficiency 10/19/2022   Bilateral tinnitus 11/08/2021   Elevated blood-pressure reading, without diagnosis of hypertension 03/13/2021   Microscopic hematuria 11/26/2020   Chronic pain 11/10/2020   Eosinophilic esophagitis 11/10/2020   Generalized anxiety disorder 11/10/2020   Hardening of the aorta (main artery of the heart) (HCC) 11/10/2020   Osteoporosis  11/10/2020   Spondylolisthesis at L4-L5 level 11/10/2020   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   Corns and callosities 05/20/2019   Pain in both feet 05/20/2019   Chest pain 01/28/2019   Chest pain of uncertain etiology 12/25/2018   Adiposity 07/13/2018   Excess weight 07/13/2018   Hay fever 07/13/2018   PAF (paroxysmal atrial fibrillation) (HCC) 08/25/2017   Neck pain 12/30/2016   Chronic low back pain 08/12/2016   Syncope 05/13/2016   Heart palpitations 05/13/2016   Rhinitis, chronic 02/04/2016   IBS (irritable bowel syndrome) 07/14/2015   Nausea without vomiting 07/14/2015   OA (osteoarthritis) of knee 06/23/2015   OSA (obstructive sleep apnea) 05/14/2015   Acute back pain with sciatica 04/03/2015   Preoperative clearance 01/08/2015   Acute infection of nasal sinus 02/25/2014   Pancreatitis 07/22/2013   Leukocytosis 07/22/2013   Amblyopia 03/07/2013   Retinal scar 03/07/2013   Cellulitis 01/15/2013   Cerebrovascular small vessel disease 11/08/2012   Angular blepharitis 10/11/2012   Dry eyes 10/11/2012   PCO (posterior capsular opacification) 10/11/2012   PVD (posterior vitreous detachment) 10/11/2012   Crossover toe 09/20/2012   Cataracts, bilateral    Elevated cholesterol    Degenerative disc disease    Bunion    Ankle fracture    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HCC)    Hemorrhoid    DUB (dysfunctional uterine bleeding)    DIARRHEA 09/16/2009   History of colonic polyps 09/16/2009   LACUNAR INFARCTION 07/16/2008   CONSTIPATION 07/12/2008   CHEST PAIN 07/12/2008   DYSPHAGIA 07/12/2008   Hypothyroidism 07/10/2008   Anxiety state 07/10/2008   DEPRESSION 07/10/2008   INTERNAL HEMORRHOIDS 07/10/2008   ESOPHAGEAL STRICTURE 07/10/2008   GERD 07/10/2008   HIATAL HERNIA 07/10/2008   DIVERTICULOSIS, COLON 07/10/2008   IRRITABLE BOWEL SYNDROME 07/10/2008   Osteoarthritis 07/10/2008   FIBROMYALGIA 07/10/2008   Blues 07/10/2008    ONSET DATE: 11/09/2023 (MD referral)  REFERRING DIAG:   R41.3 (ICD-10-CM) - Memory loss  G62.9 (ICD-10-CM) - Neuropathy  R26.9 (ICD-10-CM) - Gait abnormality    THERAPY DIAG:  Unsteadiness on feet  Other abnormalities of gait and mobility  Muscle weakness (generalized)  Dizziness and giddiness  Rationale for Evaluation and Treatment: Rehabilitation  SUBJECTIVE:  SUBJECTIVE STATEMENT: The patient reports her feet are sore (has a corn) and they burn. She has not been able to do her HEP due to pain. She is having family drive her to appointments.  Pt accompanied by: self  PERTINENT HISTORY: Dr. Georgianne note:  EMG limited due to swelling; worry about cervical spondylitic myelopathy-MRI ordered PMH of a-fib on Eliquis , lacunar infarction, memory loss, hypothyroidism, walking pneumonia; pt reported hx of Bilat TKR  PAIN:  Are you having pain? Yes: NPRS scale: 4/10 Pain location: BLEs Pain description: deep, muscular ache Aggravating factors: being on feet too long Relieving factors: heat helps, elevating legs *burning in feet on 12/01/23  PRECAUTIONS: Fall  WEIGHT BEARING RESTRICTIONS: No  FALLS: Has patient fallen in last 6 months? No  LIVING ENVIRONMENT: Lives with: lives alone and family lives nearby Lives in: House/apartment Stairs: 2 steps to enter Has following equipment at home: Single point cane and Environmental consultant - 2 wheeled  PLOF: Independent  PATIENT GOALS: Feel safer, feel stronger with legs  OBJECTIVE:    TODAY'S TREATMENT: 12/01/2023 Activity Comments  Vitals  BP=127/74 HR=63   Balance:  Standing in parallel bars Marching x 10 reps Sidestepping x 10 reps with intermittent UE support  Step ups with R and L x 10 reps  Stagger stance rocking in front of wall  Rolling a ball up/down the wall   Standing   Heel raises x 10  reps Toe raises x 10 reps Hip abduction x 10 reps   Sit to stand x 5 reps x 2 sets Cues for wider foot position and for technique to lessen reliance on Ues; still needs significant UE support    Gaze adaptation   X 1 viewing Seated-- hard to coordinate  *she reports she came here for vertigo in the past, she still gets dizziness at hair dresser     PATIENT EDUCATION: Education details: HEP, Lars results and use RW for safety Person educated: Patient Education method: Explanation, Demonstration, Verbal cues, and Handouts Education comprehension: verbalized understanding, returned demonstration, and needs further education HOME EXERCISE PROGRAM: Access Code: [email or web address] URL: [email or web address] Date: 12/01/2023 Prepared by: Tawni Ferrier  Exercises - Supine Piriformis Stretch with Foot on Ground  - 1 x daily - 5 x weekly - 1 sets - 3 reps - 20 seconds hold - Bent Knee Fallouts  - 1 x daily - 5 x weekly - 1 sets - 10 reps - Seated Ankle Pumps  - 1 x daily - 5 x weekly - 3 sets - 10 reps - Seated Long Arc Quad  - 1 x daily - 5 x weekly - 3 sets - 10 reps - Sit to Stand with Counter Support  - 1 x daily - 5 x weekly - 1 sets - 5 reps - Staggered Stance Forward Backward Weight Shift with Counter Support  - 1 x daily - 5 x weekly - 1 sets - 10 reps  ---------------------------------------------------- Note: Objective measures were completed at Evaluation unless otherwise noted.  DIAGNOSTIC FINDINGS: awaiting MRI  COGNITION: Overall cognitive status: Within functional limits for tasks assessed and History of cognitive impairments - at baseline   SENSATION: Light touch: Impaired  more distally  EDEMA:  +1 pitting edema BLEs, LLE > RLE  MUSCLE TONE: Generally stiff with P/ROM in BLEs   POSTURE: rounded shoulders and forward head  LOWER EXTREMITY ROM:    Active  Right Eval Left Eval  Hip flexion    Hip extension    Hip abduction  Hip adduction    Hip  internal rotation    Hip external rotation    Knee flexion    Knee extension -10 -20  Ankle dorsiflexion +10 + 2  Ankle plantarflexion    Ankle inversion    Ankle eversion     (Blank rows = not tested)  LOWER EXTREMITY MMT:   MMT Right Eval Left Eval  Hip flexion 4 4  Hip extension    Hip abduction 4 4  Hip adduction 4+ 4+  Hip internal rotation    Hip external rotation    Knee flexion 4 4  Knee extension 4 4  Ankle dorsiflexion 3+ 3+  Ankle plantarflexion    Ankle inversion    Ankle eversion    (Blank rows = not tested)  TRANSFERS: Sit to stand: Modified independence  Assistive device utilized: None     Stand to sit: Modified independence  Assistive device utilized: None       GAIT: Findings: Gait Characteristics: step through pattern, Distance walked: 50 ft x 2 , Assistive device utilized:Walker - 2 wheeled, and Level of assistance: SBA  FUNCTIONAL TESTS:  5 times sit to stand: 24.47 sec with UE support Timed up and go (TUG): 28.16 sec RW; 20.53 sec  10 meter walk test: 20.53 sec (1.6 ft/sec) Berg Balance:  TBA EO feet together:  30 sec mild sway EC feet together 20 sec moderate sway *Pt c/o sciatica type pain in RLE/piriformis area with short distance gait.                                                                                                                       GOALS: Goals reviewed with patient? Yes  SHORT TERM GOALS: Target date: 12/16/2023  Pt will be independent with HEP for improved balance, strength, gait. Baseline: Goal status: INITIAL  2.  Pt will improve 5x sit<>stand to less than or equal to 18 sec with UE support to demonstrate improved functional strength and transfer efficiency. Baseline: 24.47 sec with BUE support Goal status: INITIAL  3.  Pt will improve TUG score to less than or equal to 20 sec with RW for decreased fall risk. Baseline: 28 sec Goal status: INITIAL  LONG TERM GOALS: Target date: 01/13/2024  Pt will be  independent with HEP for improved strength, balance, gait. Baseline:  Goal status: INITIAL  2.  Pt will improve 5x sit<>stand to less than or equal to 15 sec with light to no UE support to demonstrate improved functional strength and transfer efficiency. Baseline: 24.47 sec with UE support Goal status: INITIAL  3.  Pt will improve TUG score to less than or equal to 15 sec with LRAD for decreased fall risk. Baseline:  Goal status: INITIAL  4.  Pt will improve Berg score to at least 45/56 to decrease fall risk. Baseline: TBA Goal status: INITIAL  5.  Pt will verbalize understanding of fall prevention in home environment.  Baseline:  Goal status: INITIAL  6.  Pt will improve gait velocity to at least 2 ft/sec for improved gait efficiency and safety.  Baseline:  Goal status:  INITIAL  ASSESSMENT:  CLINICAL IMPRESSION: The patient is improving during session today arriving with a shuffling gait pattern that improves with activity. PT encouraging participation in HEP to reduce pain and improve mobility at home. Patient tolerated activities well today in therapy. May benefit from further testing for vertigo at future visit.  OBJECTIVE IMPAIRMENTS: Abnormal gait, decreased balance, decreased mobility, difficulty walking, and decreased strength.   PLAN:  PT FREQUENCY: 2x/week  PT DURATION: 8 weeks including eval week  PLANNED INTERVENTIONS: 97750- Physical Performance Testing, 97110-Therapeutic exercises, 97530- Therapeutic activity, 97112- Neuromuscular re-education, 97535- Self Care, [id number]- Manual therapy, [phone]- Gait training, Patient/Family education, and Balance training  PLAN FOR NEXT SESSION: Work on balance and strength, ankle mobility, hip mobility, consider assessing vertigo if remains ongoing issue (she notes better than 2 years ago, but occasional issues)   Nasiyah Laverdiere, PT 12/01/2023, 3:36 PM  [facility] at [facility] [address], [address] [place], [place] [address] Phone # [phone] Fax # [phone]

## 2023-12-01 ENCOUNTER — Ambulatory Visit: Attending: Neurology | Admitting: Rehabilitative and Restorative Service Providers"

## 2023-12-01 DIAGNOSIS — R42 Dizziness and giddiness: Secondary | ICD-10-CM | POA: Insufficient documentation

## 2023-12-01 DIAGNOSIS — R2689 Other abnormalities of gait and mobility: Secondary | ICD-10-CM | POA: Insufficient documentation

## 2023-12-01 DIAGNOSIS — M6281 Muscle weakness (generalized): Secondary | ICD-10-CM | POA: Diagnosis not present

## 2023-12-01 DIAGNOSIS — R2681 Unsteadiness on feet: Secondary | ICD-10-CM | POA: Insufficient documentation

## 2023-12-05 NOTE — Telephone Encounter (Signed)
 Patient called unable to reach [employer]. Informed patient they may be on still on vacation and I Alphonsa) will call them tomorrow to check on referral.

## 2023-12-06 ENCOUNTER — Ambulatory Visit: Admitting: Physical Therapy

## 2023-12-08 ENCOUNTER — Ambulatory Visit

## 2023-12-08 DIAGNOSIS — I872 Venous insufficiency (chronic) (peripheral): Secondary | ICD-10-CM | POA: Diagnosis not present

## 2023-12-08 DIAGNOSIS — M2040 Other hammer toe(s) (acquired), unspecified foot: Secondary | ICD-10-CM | POA: Diagnosis not present

## 2023-12-08 DIAGNOSIS — M7989 Other specified soft tissue disorders: Secondary | ICD-10-CM | POA: Diagnosis not present

## 2023-12-12 ENCOUNTER — Telehealth: Payer: Self-pay | Admitting: Neurology

## 2023-12-12 NOTE — Telephone Encounter (Signed)
 Patient called to get number for [facility] to call to schedule appt.

## 2023-12-12 NOTE — Telephone Encounter (Signed)
 Pt called inregards to MRI , Pt had on June 11. Pt states she has question about that results. She remember talking to someone but wasn't  sure . Pt states  she has more question to ask MD .

## 2023-12-12 NOTE — Telephone Encounter (Signed)
 Chantel from [employer] has called to confirm pt has been scheduled for 9-29 at 8:30

## 2023-12-13 ENCOUNTER — Ambulatory Visit: Admitting: Physical Therapy

## 2023-12-13 ENCOUNTER — Encounter: Payer: Self-pay | Admitting: Physical Therapy

## 2023-12-13 DIAGNOSIS — R2689 Other abnormalities of gait and mobility: Secondary | ICD-10-CM

## 2023-12-13 DIAGNOSIS — R2681 Unsteadiness on feet: Secondary | ICD-10-CM | POA: Diagnosis not present

## 2023-12-13 DIAGNOSIS — M6281 Muscle weakness (generalized): Secondary | ICD-10-CM

## 2023-12-13 DIAGNOSIS — R42 Dizziness and giddiness: Secondary | ICD-10-CM | POA: Diagnosis not present

## 2023-12-13 NOTE — Telephone Encounter (Signed)
 Spoke w/Pt regarding phone message from 7/14. Pt sounded very confused and could not remember why she called. Pt stated she had not been awake very long and was apologetic. Pt had confused the NCV with the MRI and was not sure of the results for either. Went over notes in chart from Dr. Margaret regarding NCV/EMG as well as reiterated the results of the MRI. Pt had questions regarding [facility]. Pt stated she had received calls from them but wasn't sure when she was supposed to go. Encouraged Pt to get in touch with the driving rehab in [place] as she said they had contacted her. Pt continued to be very confused about all of the details regarding the testing and her getting into the rehab driving program. Again encouraged the Pt to call the driving rehab center in [place] as she continued to have questions regarding the driving rehab. Informed Pt if she had further questions about MRI or NCV to call our office. Pt voiced understanding and thanks for the call.

## 2023-12-13 NOTE — Therapy (Signed)
 OUTPATIENT PHYSICAL THERAPY NEURO TREATMENT   Patient Name: Kimberly [name] MRN: [id number] DOB:June 21, 1940, 83 y.o., female Today's Date: 12/13/2023   PCP: Ransom Other, MD REFERRING PROVIDER: Onita Duos, MD   END OF SESSION:  PT End of Session - 12/13/23 1318     Visit Number 4    Number of Visits 16    Date for PT Re-Evaluation 01/13/24    Authorization Type [employer] Medicare-approved 16 PT visits from 11/21/2023-01/13/2024    Authorization - Visit Number 3    Authorization - Number of Visits 16    Progress Note Due on Visit 10    PT Start Time 1320    PT Stop Time 1402    PT Time Calculation (min) 42 min    Equipment Utilized During Treatment Gait belt    Activity Tolerance Patient tolerated treatment well    Behavior During Therapy WFL for tasks assessed/performed             Past Medical History:  Diagnosis Date   Allergy    Anal polyp 1998   Flex Sig    Anemia    Angular blepharitis of left eye    Ankle fracture    Stress fracture   Anxiety    Aortic atherosclerosis (HCC)    Asthma    border line has inhaler   Bunion    Cataracts, bilateral    Corneal scar    left eye   CPAP (continuous positive airway pressure) dependence    Degenerative disc disease    Depression    Diverticulosis of colon (without mention of hemorrhage) 2010   Colonoscopy   Dry eyes    bilateral   Enterocele    External hemorrhoids 2000   Colonoscopy   Family history of malignant neoplasm of gastrointestinal tract    Female cystocele    Fibromyalgia    GERD (gastroesophageal reflux disease)    Hiatal hernia 2005,2010   EGD   History of bronchitis    History of measles    History of mumps    History of strep sore throat    History of urinary tract infection    Hyperlipemia    Hypothyroidism    IBS (irritable bowel syndrome)    Imbalance    Internal hemorrhoids without mention of complication 1995,2005   Colonoscopy    Itching    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HCC)     Menopause    Migraine    OSA (obstructive sleep apnea) 05/14/2015   on BiPAP   Osteopenia 10/2013   T score -1.6 FRAX 10%/1.6%   PAF (paroxysmal atrial fibrillation) (HCC)    Pancreatitis    PCO (posterior capsular opacification)    left   Pneumonia    childhood illness   PONV (postoperative nausea and vomiting)    Pseudophakia, both eyes    PVD (posterior vitreous detachment) right   Rash    on back    Retinal scar    left   Rotator cuff disorder    pain, left shoulder   Shingles    Stricture and stenosis of esophagus 2005,2010   EGD    Stroke (HCC)    Ulcerative colitis (HCC)    Varicose veins    Vertigo    Wears glasses    Past Surgical History:  Procedure Laterality Date   ABDOMINAL HYSTERECTOMY     ANAL FISSURE REPAIR     BREAST EXCISIONAL BIOPSY Left    BUNIONECTOMY  2014   CATARACT EXTRACTION Bilateral  2013   COLONOSCOPY     polyp removed   ESOPHAGEAL DILATION     EYE SURGERY Left    FOOT SURGERY     left    HEMORRHOID SURGERY     MOUTH SURGERY  11/13/11   Cyst removed from gum-benign    NASAL SEPTUM SURGERY     NASAL SEPTUM SURGERY  1970's   REPLACEMENT TOTAL KNEE Right 2011   TONSILLECTOMY     TOTAL ABDOMINAL HYSTERECTOMY W/ BILATERAL SALPINGOOPHORECTOMY  1991   TAH BSO   TOTAL HIP ARTHROPLASTY     right    TOTAL KNEE ARTHROPLASTY     both   TOTAL KNEE ARTHROPLASTY Left 06/23/2015   Procedure: LEFT TOTAL KNEE ARTHROPLASTY;  Surgeon: Dempsey Moan, MD;  Location: [facility];  Service: Orthopedics;  Laterality: Left;   Patient Active Problem List   Diagnosis Date Noted   Gait abnormality 11/09/2023   Neuropathy 11/09/2023   Memory loss 11/09/2023   Chronic venous insufficiency 10/19/2022   Bilateral tinnitus 11/08/2021   Elevated blood-pressure reading, without diagnosis of hypertension 03/13/2021   Microscopic hematuria 11/26/2020   Chronic pain 11/10/2020   Eosinophilic esophagitis 11/10/2020   Generalized anxiety disorder 11/10/2020    Hardening of the aorta (main artery of the heart) (HCC) 11/10/2020   Osteoporosis 11/10/2020   Spondylolisthesis at L4-L5 level 11/10/2020   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   Corns and callosities 05/20/2019   Pain in both feet 05/20/2019   Chest pain 01/28/2019   Chest pain of uncertain etiology 12/25/2018   Adiposity 07/13/2018   Excess weight 07/13/2018   Hay fever 07/13/2018   PAF (paroxysmal atrial fibrillation) (HCC) 08/25/2017   Neck pain 12/30/2016   Chronic low back pain 08/12/2016   Syncope 05/13/2016   Heart palpitations 05/13/2016   Rhinitis, chronic 02/04/2016   IBS (irritable bowel syndrome) 07/14/2015   Nausea without vomiting 07/14/2015   OA (osteoarthritis) of knee 06/23/2015   OSA (obstructive sleep apnea) 05/14/2015   Acute back pain with sciatica 04/03/2015   Preoperative clearance 01/08/2015   Acute infection of nasal sinus 02/25/2014   Pancreatitis 07/22/2013   Leukocytosis 07/22/2013   Amblyopia 03/07/2013   Retinal scar 03/07/2013   Cellulitis 01/15/2013   Cerebrovascular small vessel disease 11/08/2012   Angular blepharitis 10/11/2012   Dry eyes 10/11/2012   PCO (posterior capsular opacification) 10/11/2012   PVD (posterior vitreous detachment) 10/11/2012   Crossover toe 09/20/2012   Cataracts, bilateral    Elevated cholesterol    Degenerative disc disease    Bunion    Ankle fracture    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HCC)    Hemorrhoid    DUB (dysfunctional uterine bleeding)    DIARRHEA 09/16/2009   History of colonic polyps 09/16/2009   LACUNAR INFARCTION 07/16/2008   CONSTIPATION 07/12/2008   CHEST PAIN 07/12/2008   DYSPHAGIA 07/12/2008   Hypothyroidism 07/10/2008   Anxiety state 07/10/2008   DEPRESSION 07/10/2008   INTERNAL HEMORRHOIDS 07/10/2008   ESOPHAGEAL STRICTURE 07/10/2008   GERD 07/10/2008   HIATAL HERNIA 07/10/2008   DIVERTICULOSIS, COLON 07/10/2008   IRRITABLE BOWEL SYNDROME 07/10/2008   Osteoarthritis 07/10/2008   FIBROMYALGIA  07/10/2008   Blues 07/10/2008    ONSET DATE: 11/09/2023 (MD referral)  REFERRING DIAG:  R41.3 (ICD-10-CM) - Memory loss  G62.9 (ICD-10-CM) - Neuropathy  R26.9 (ICD-10-CM) - Gait abnormality    THERAPY DIAG:  Unsteadiness on feet  Other abnormalities of gait and mobility  Muscle weakness (generalized)  Rationale for Evaluation and Treatment: Rehabilitation  SUBJECTIVE:                                                                                                                                                                                             SUBJECTIVE STATEMENT: Continuing to have pain from the corn on bottom of L foot; treating with a pad and it is painful. Pt accompanied by: self  PERTINENT HISTORY: Dr. Georgianne note:  EMG limited due to swelling; worry about cervical spondylitic myelopathy-MRI ordered PMH of a-fib on Eliquis , lacunar infarction, memory loss, hypothyroidism, walking pneumonia; pt reported hx of Bilat TKR  PAIN:  Are you having pain? Yes: NPRS scale: 4/10 Pain location: BLEs Pain description: deep, muscular ache Aggravating factors: being on feet too long Relieving factors: heat helps, elevating legs *burning in feet on 12/01/23  PRECAUTIONS: Fall  WEIGHT BEARING RESTRICTIONS: No  FALLS: Has patient fallen in last 6 months? No  LIVING ENVIRONMENT: Lives with: lives alone and family lives nearby Lives in: House/apartment Stairs: 2 steps to enter Has following equipment at home: Single point cane and Environmental consultant - 2 wheeled  PLOF: Independent  PATIENT GOALS: Feel safer, feel stronger with legs  OBJECTIVE:    TODAY'S TREATMENT: 12/13/2023 Activity Comments  HEP Review Return demo with cues; cues for foot position and weigthshift with stagger stance rocking  FTSTS:  15.25 sec with UE support Improved from 24 seconds  TUG:  20.10 sec with RW Improved from 28 seconds  Forward/back walking x 2 min Sidestepping x 2 min Marching in place x 2  min BUE>1 UE support  Forward/back step and weightshift   EO head turns/nods EC head turns/nods  Feet apart, light UE support        PATIENT EDUCATION: Education details: Continue current HEP; progress towards goals and POC Person educated: Patient Education method: Programmer, multimedia, Demonstration, Verbal cues, and Handouts Education comprehension: verbalized understanding, returned demonstration, and needs further education  HOME EXERCISE PROGRAM: Access Code: [email or web address] URL: [email or web address] Date: 12/01/2023 Prepared by: Tawni Ferrier  Exercises - Supine Piriformis Stretch with Foot on Ground  - 1 x daily - 5 x weekly - 1 sets - 3 reps - 20 seconds hold - Bent Knee Fallouts  - 1 x daily - 5 x weekly - 1 sets - 10 reps - Seated Ankle Pumps  - 1 x daily - 5 x weekly - 3 sets - 10 reps - Seated Long Arc Quad  - 1 x daily - 5 x weekly - 3 sets - 10 reps - Sit to Stand with Counter Support  - 1 x daily - 5 x weekly - 1 sets -  5 reps - Staggered Stance Forward Backward Weight Shift with Counter Support  - 1 x daily - 5 x weekly - 1 sets - 10 reps  ---------------------------------------------------- Note: Objective measures were completed at Evaluation unless otherwise noted.  DIAGNOSTIC FINDINGS: awaiting MRI  COGNITION: Overall cognitive status: Within functional limits for tasks assessed and History of cognitive impairments - at baseline   SENSATION: Light touch: Impaired  more distally  EDEMA:  +1 pitting edema BLEs, LLE > RLE  MUSCLE TONE: Generally stiff with P/ROM in BLEs   POSTURE: rounded shoulders and forward head  LOWER EXTREMITY ROM:    Active  Right Eval Left Eval  Hip flexion    Hip extension    Hip abduction    Hip adduction    Hip internal rotation    Hip external rotation    Knee flexion    Knee extension -10 -20  Ankle dorsiflexion +10 + 2  Ankle plantarflexion    Ankle inversion    Ankle eversion     (Blank rows = not  tested)  LOWER EXTREMITY MMT:   MMT Right Eval Left Eval  Hip flexion 4 4  Hip extension    Hip abduction 4 4  Hip adduction 4+ 4+  Hip internal rotation    Hip external rotation    Knee flexion 4 4  Knee extension 4 4  Ankle dorsiflexion 3+ 3+  Ankle plantarflexion    Ankle inversion    Ankle eversion    (Blank rows = not tested)  TRANSFERS: Sit to stand: Modified independence  Assistive device utilized: None     Stand to sit: Modified independence  Assistive device utilized: None       GAIT: Findings: Gait Characteristics: step through pattern, Distance walked: 50 ft x 2 , Assistive device utilized:Walker - 2 wheeled, and Level of assistance: SBA  FUNCTIONAL TESTS:  5 times sit to stand: 24.47 sec with UE support Timed up and go (TUG): 28.16 sec RW; 20.53 sec  10 meter walk test: 20.53 sec (1.6 ft/sec) Berg Balance:  TBA EO feet together:  30 sec mild sway EC feet together 20 sec moderate sway *Pt c/o sciatica type pain in RLE/piriformis area with short distance gait.                                                                                                                       GOALS: Goals reviewed with patient? Yes  SHORT TERM GOALS: Target date: 12/16/2023  Pt will be independent with HEP for improved balance, strength, gait. Baseline: Goal status: PARTIALLY MET, 12/13/2023  2.  Pt will improve 5x sit<>stand to less than or equal to 18 sec with UE support to demonstrate improved functional strength and transfer efficiency. Baseline: 24.47 sec with BUE support>15 sec 12/13/2023 Goal status: MET, 12/13/2023  3.  Pt will improve TUG score to less than or equal to 20 sec with RW for decreased fall risk. Baseline: 28 sec>20 sec Goal status: MET,  12/13/2023  LONG TERM GOALS: Target date: 01/13/2024  Pt will be independent with HEP for improved strength, balance, gait. Baseline:  Goal status: INITIAL  2.  Pt will improve 5x sit<>stand to less than or equal  to 15 sec with light to no UE support to demonstrate improved functional strength and transfer efficiency. Baseline: 24.47 sec with UE support Goal status: INITIAL  3.  Pt will improve TUG score to less than or equal to 15 sec with LRAD for decreased fall risk. Baseline:  Goal status: INITIAL  4.  Pt will improve Berg score to at least 45/56 to decrease fall risk. Baseline: TBA Goal status: INITIAL  5.  Pt will verbalize understanding of fall prevention in home environment.  Baseline:  Goal status: INITIAL  6.  Pt will improve gait velocity to at least 2 ft/sec for improved gait efficiency and safety.  Baseline:  Goal status:  INITIAL  ASSESSMENT:  CLINICAL IMPRESSION: Pt presents today with continued pain due to corn on her left foot.  She is addressing this with pads and will plan to speak to foot doctor. Skilled PT session focused on assessing STGs and looking at balance activities.  She has met 2 of 3 STGs, improving with TUG and FTSTS scores.  She remains at fall risk, but is improving.  Worked on lessening UE support with balance activities and pt unsteady with single UE compared to BUE support.  She will continue to benefit from skilled PT towards goals for improved functional mobility and decreased fall risk.   OBJECTIVE IMPAIRMENTS: Abnormal gait, decreased balance, decreased mobility, difficulty walking, and decreased strength.   PLAN:  PT FREQUENCY: 2x/week  PT DURATION: 8 weeks including eval week  PLANNED INTERVENTIONS: 97750- Physical Performance Testing, 97110-Therapeutic exercises, 97530- Therapeutic activity, 97112- Neuromuscular re-education, 97535- Self Care, [id number]- Manual therapy, [phone]- Gait training, Patient/Family education, and Balance training  PLAN FOR NEXT SESSION: Continue to work on balance and strength, ankle mobility, hip mobility, consider assessing vertigo if remains ongoing issue (she notes better than 2 years ago, but occasional  issues)   Swade Shonka W., PT 12/13/2023, 2:02 PM  [facility] at [facility] [address], [address] [place], [place] [address] Phone # [phone] Fax # [phone]

## 2023-12-13 NOTE — Telephone Encounter (Signed)
 This is correct and it is in her chart. Patient has been made aware of this before.   If pt calls back inquiring of this please advise that this information is in her chart

## 2023-12-13 NOTE — Telephone Encounter (Signed)
 Pt states she failed to mention years ago she was told that she had a lacunar stroke, she wants to know if that shows in her chart, pt is asking for a call to discuss.

## 2023-12-20 ENCOUNTER — Encounter: Payer: Self-pay | Admitting: Physical Therapy

## 2023-12-20 ENCOUNTER — Ambulatory Visit: Admitting: Physical Therapy

## 2023-12-20 DIAGNOSIS — M6281 Muscle weakness (generalized): Secondary | ICD-10-CM | POA: Diagnosis not present

## 2023-12-20 DIAGNOSIS — R2681 Unsteadiness on feet: Secondary | ICD-10-CM | POA: Diagnosis not present

## 2023-12-20 DIAGNOSIS — R42 Dizziness and giddiness: Secondary | ICD-10-CM | POA: Diagnosis not present

## 2023-12-20 DIAGNOSIS — R2689 Other abnormalities of gait and mobility: Secondary | ICD-10-CM

## 2023-12-20 NOTE — Therapy (Signed)
 OUTPATIENT PHYSICAL THERAPY NEURO TREATMENT   Patient Name: Kimberly [name] MRN: [id number] DOB:[birth year], 83 y.o., female Today's Date: 12/20/2023   PCP: Ransom Other, MD REFERRING PROVIDER: Onita Duos, MD   END OF SESSION:  PT End of Session - 12/20/23 1311     Visit Number 5    Number of Visits 16    Date for PT Re-Evaluation 01/13/24    Authorization Type [employer] Medicare-approved 16 PT visits from 11/21/2023-01/13/2024    Authorization - Visit Number 4    Authorization - Number of Visits 16    Progress Note Due on Visit 10    PT Start Time 1312    PT Stop Time 1350    PT Time Calculation (min) 38 min    Equipment Utilized During Treatment Gait belt    Activity Tolerance Patient tolerated treatment well    Behavior During Therapy WFL for tasks assessed/performed            Past Medical History:  Diagnosis Date   Allergy    Anal polyp 1998   Flex Sig    Anemia    Angular blepharitis of left eye    Ankle fracture    Stress fracture   Anxiety    Aortic atherosclerosis (HCC)    Asthma    border line has inhaler   Bunion    Cataracts, bilateral    Corneal scar    left eye   CPAP (continuous positive airway pressure) dependence    Degenerative disc disease    Depression    Diverticulosis of colon (without mention of hemorrhage) 2010   Colonoscopy   Dry eyes    bilateral   Enterocele    External hemorrhoids 2000   Colonoscopy   Family history of malignant neoplasm of gastrointestinal tract    Female cystocele    Fibromyalgia    GERD (gastroesophageal reflux disease)    Hiatal hernia 2005,2010   EGD   History of bronchitis    History of measles    History of mumps    History of strep sore throat    History of urinary tract infection    Hyperlipemia    Hypothyroidism    IBS (irritable bowel syndrome)    Imbalance    Internal hemorrhoids without mention of complication 1995,2005   Colonoscopy    Itching    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HCC)     Menopause    Migraine    OSA (obstructive sleep apnea) 05/14/2015   on BiPAP   Osteopenia 10/2013   T score -1.6 FRAX 10%/1.6%   PAF (paroxysmal atrial fibrillation) (HCC)    Pancreatitis    PCO (posterior capsular opacification)    left   Pneumonia    childhood illness   PONV (postoperative nausea and vomiting)    Pseudophakia, both eyes    PVD (posterior vitreous detachment) right   Rash    on back    Retinal scar    left   Rotator cuff disorder    pain, left shoulder   Shingles    Stricture and stenosis of esophagus 2005,2010   EGD    Stroke (HCC)    Ulcerative colitis (HCC)    Varicose veins    Vertigo    Wears glasses    Past Surgical History:  Procedure Laterality Date   ABDOMINAL HYSTERECTOMY     ANAL FISSURE REPAIR     BREAST EXCISIONAL BIOPSY Left    BUNIONECTOMY  2014   CATARACT EXTRACTION Bilateral 2013  COLONOSCOPY     polyp removed   ESOPHAGEAL DILATION     EYE SURGERY Left    FOOT SURGERY     left    HEMORRHOID SURGERY     MOUTH SURGERY  11/13/11   Cyst removed from gum-benign    NASAL SEPTUM SURGERY     NASAL SEPTUM SURGERY  1970's   REPLACEMENT TOTAL KNEE Right 2011   TONSILLECTOMY     TOTAL ABDOMINAL HYSTERECTOMY W/ BILATERAL SALPINGOOPHORECTOMY  1991   TAH BSO   TOTAL HIP ARTHROPLASTY     right    TOTAL KNEE ARTHROPLASTY     both   TOTAL KNEE ARTHROPLASTY Left 06/23/2015   Procedure: LEFT TOTAL KNEE ARTHROPLASTY;  Surgeon: Dempsey Moan, MD;  Location: [facility];  Service: Orthopedics;  Laterality: Left;   Patient Active Problem List   Diagnosis Date Noted   Gait abnormality 11/09/2023   Neuropathy 11/09/2023   Memory loss 11/09/2023   Chronic venous insufficiency 10/19/2022   Bilateral tinnitus 11/08/2021   Elevated blood-pressure reading, without diagnosis of hypertension 03/13/2021   Microscopic hematuria 11/26/2020   Chronic pain 11/10/2020   Eosinophilic esophagitis 11/10/2020   Generalized anxiety disorder 11/10/2020    Hardening of the aorta (main artery of the heart) (HCC) 11/10/2020   Osteoporosis 11/10/2020   Spondylolisthesis at L4-L5 level 11/10/2020   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   Corns and callosities 05/20/2019   Pain in both feet 05/20/2019   Chest pain 01/28/2019   Chest pain of uncertain etiology 12/25/2018   Adiposity 07/13/2018   Excess weight 07/13/2018   Hay fever 07/13/2018   PAF (paroxysmal atrial fibrillation) (HCC) 08/25/2017   Neck pain 12/30/2016   Chronic low back pain 08/12/2016   Syncope 05/13/2016   Heart palpitations 05/13/2016   Rhinitis, chronic 02/04/2016   IBS (irritable bowel syndrome) 07/14/2015   Nausea without vomiting 07/14/2015   OA (osteoarthritis) of knee 06/23/2015   OSA (obstructive sleep apnea) 05/14/2015   Acute back pain with sciatica 04/03/2015   Preoperative clearance 01/08/2015   Acute infection of nasal sinus 02/25/2014   Pancreatitis 07/22/2013   Leukocytosis 07/22/2013   Amblyopia 03/07/2013   Retinal scar 03/07/2013   Cellulitis 01/15/2013   Cerebrovascular small vessel disease 11/08/2012   Angular blepharitis 10/11/2012   Dry eyes 10/11/2012   PCO (posterior capsular opacification) 10/11/2012   PVD (posterior vitreous detachment) 10/11/2012   Crossover toe 09/20/2012   Cataracts, bilateral    Elevated cholesterol    Degenerative disc disease    Bunion    Ankle fracture    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HCC)    Hemorrhoid    DUB (dysfunctional uterine bleeding)    DIARRHEA 09/16/2009   History of colonic polyps 09/16/2009   LACUNAR INFARCTION 07/16/2008   CONSTIPATION 07/12/2008   CHEST PAIN 07/12/2008   DYSPHAGIA 07/12/2008   Hypothyroidism 07/10/2008   Anxiety state 07/10/2008   DEPRESSION 07/10/2008   INTERNAL HEMORRHOIDS 07/10/2008   ESOPHAGEAL STRICTURE 07/10/2008   GERD 07/10/2008   HIATAL HERNIA 07/10/2008   DIVERTICULOSIS, COLON 07/10/2008   IRRITABLE BOWEL SYNDROME 07/10/2008   Osteoarthritis 07/10/2008   FIBROMYALGIA  07/10/2008   Blues 07/10/2008    ONSET DATE: 11/09/2023 (MD referral)  REFERRING DIAG:  R41.3 (ICD-10-CM) - Memory loss  G62.9 (ICD-10-CM) - Neuropathy  R26.9 (ICD-10-CM) - Gait abnormality    THERAPY DIAG:  Unsteadiness on feet  Other abnormalities of gait and mobility  Muscle weakness (generalized)  Dizziness and giddiness  Rationale for Evaluation and Treatment: Rehabilitation  SUBJECTIVE:                                                                                                                                                                                             SUBJECTIVE STATEMENT: Pt states she feels her neuropathy is worsening. Reports increased burning and pain in her toes. Trying to see her PCP earlier to address this.  Pt accompanied by: self  PERTINENT HISTORY: Dr. Georgianne note:  EMG limited due to swelling; worry about cervical spondylitic myelopathy-MRI ordered PMH of a-fib on Eliquis , lacunar infarction, memory loss, hypothyroidism, walking pneumonia; pt reported hx of Bilat TKR  PAIN:  Are you having pain? Yes: NPRS scale: 4-6/10 Pain location: BLEs, worse on the L (has a corn here) Pain description: deep, muscular ache Aggravating factors: being on feet too long Relieving factors: heat helps, elevating legs *burning in feet on 12/01/23  PRECAUTIONS: Fall  WEIGHT BEARING RESTRICTIONS: No  FALLS: Has patient fallen in last 6 months? No  LIVING ENVIRONMENT: Lives with: lives alone and family lives nearby Lives in: House/apartment Stairs: 2 steps to enter Has following equipment at home: Single point cane and Environmental consultant - 2 wheeled  PLOF: Independent  PATIENT GOALS: Feel safer, feel stronger with legs  OBJECTIVE:    TODAY'S TREATMENT: 12/20/2023 Activity Comments  Nustep L5 x 6 min  Had to stop intermittently due to increased L foot burning  Supine: Figure 4 stretch x 30 Piriformis stretch x 30 SLR 2x10 Clamshell red TB 2x10 Bridge  2x10 Ankle DF red TB 2x10 Pt's head elevated on wedge and 2 pillows due to her vertigo   Pt reported feeling less neuropathy after performing supine exercises  At counter EO feet together 2x30 EO feet together head turns/nods 2x30 each Intermittent UE support.  Started to increase burning in toes  Lumbar ext against counter Felt good for pt, reported some decrease in burning             PATIENT EDUCATION: Education details: Continue current HEP; progress towards goals and POC Person educated: Patient Education method: Explanation, Demonstration, Verbal cues, and Handouts Education comprehension: verbalized understanding, returned demonstration, and needs further education  HOME EXERCISE PROGRAM: Access Code: [email or web address] URL: [email or web address] Date: 12/01/2023 Prepared by: Tawni Ferrier  Exercises - Supine Piriformis Stretch with Foot on Ground  - 1 x daily - 5 x weekly - 1 sets - 3 reps - 20 seconds hold - Bent Knee Fallouts  - 1 x daily - 5 x weekly - 1 sets - 10 reps - Seated Ankle Pumps  - 1 x daily - 5 x weekly - 3  sets - 10 reps - Seated Long Arc Quad  - 1 x daily - 5 x weekly - 3 sets - 10 reps - Sit to Stand with Counter Support  - 1 x daily - 5 x weekly - 1 sets - 5 reps - Staggered Stance Forward Backward Weight Shift with Counter Support  - 1 x daily - 5 x weekly - 1 sets - 10 reps  ---------------------------------------------------- Note: Objective measures were completed at Evaluation unless otherwise noted.  DIAGNOSTIC FINDINGS: awaiting MRI  COGNITION: Overall cognitive status: Within functional limits for tasks assessed and History of cognitive impairments - at baseline   SENSATION: Light touch: Impaired  more distally  EDEMA:  +1 pitting edema BLEs, LLE > RLE  MUSCLE TONE: Generally stiff with P/ROM in BLEs   POSTURE: rounded shoulders and forward head  LOWER EXTREMITY ROM:    Active  Right Eval Left Eval  Hip flexion     Hip extension    Hip abduction    Hip adduction    Hip internal rotation    Hip external rotation    Knee flexion    Knee extension -10 -20  Ankle dorsiflexion +10 + 2  Ankle plantarflexion    Ankle inversion    Ankle eversion     (Blank rows = not tested)  LOWER EXTREMITY MMT:   MMT Right Eval Left Eval  Hip flexion 4 4  Hip extension    Hip abduction 4 4  Hip adduction 4+ 4+  Hip internal rotation    Hip external rotation    Knee flexion 4 4  Knee extension 4 4  Ankle dorsiflexion 3+ 3+  Ankle plantarflexion    Ankle inversion    Ankle eversion    (Blank rows = not tested)  TRANSFERS: Sit to stand: Modified independence  Assistive device utilized: None     Stand to sit: Modified independence  Assistive device utilized: None       GAIT: Findings: Gait Characteristics: step through pattern, Distance walked: 50 ft x 2 , Assistive device utilized:Walker - 2 wheeled, and Level of assistance: SBA  FUNCTIONAL TESTS:  5 times sit to stand: 24.47 sec with UE support Timed up and go (TUG): 28.16 sec RW; 20.53 sec  10 meter walk test: 20.53 sec (1.6 ft/sec) Berg Balance:  TBA EO feet together:  30 sec mild sway EC feet together 20 sec moderate sway *Pt c/o sciatica type pain in RLE/piriformis area with short distance gait.                                                                                                                       GOALS: Goals reviewed with patient? Yes  SHORT TERM GOALS: Target date: 12/16/2023  Pt will be independent with HEP for improved balance, strength, gait. Baseline: Goal status: PARTIALLY MET, 12/13/2023  2.  Pt will improve 5x sit<>stand to less than or equal to 18 sec with UE support to demonstrate improved functional strength  and transfer efficiency. Baseline: 24.47 sec with BUE support>15 sec 12/13/2023 Goal status: MET, 12/13/2023  3.  Pt will improve TUG score to less than or equal to 20 sec with RW for decreased fall  risk. Baseline: 28 sec>20 sec Goal status: MET, 12/13/2023  LONG TERM GOALS: Target date: 01/13/2024  Pt will be independent with HEP for improved strength, balance, gait. Baseline:  Goal status: INITIAL  2.  Pt will improve 5x sit<>stand to less than or equal to 15 sec with light to no UE support to demonstrate improved functional strength and transfer efficiency. Baseline: 24.47 sec with UE support Goal status: INITIAL  3.  Pt will improve TUG score to less than or equal to 15 sec with LRAD for decreased fall risk. Baseline:  Goal status: INITIAL  4.  Pt will improve Berg score to at least 45/56 to decrease fall risk. Baseline: TBA Goal status: INITIAL  5.  Pt will verbalize understanding of fall prevention in home environment.  Baseline:  Goal status: INITIAL  6.  Pt will improve gait velocity to at least 2 ft/sec for improved gait efficiency and safety.  Baseline:  Goal status:  INITIAL  ASSESSMENT:  CLINICAL IMPRESSION: Pt presents today with increased neuropathic pain in bilat feet (L worse than R) so focused on strengthening exercises without putting increased weight on her feet. Tolerated supine exercises well and reported improvement in her nerve pain. Able to do some of her standing balance exercises. Started to get some burning back into her toes which resolved after performing lumbar ext exercises. Discussed with pt that if these exercises help, her nerve pain may be coming from her back.   OBJECTIVE IMPAIRMENTS: Abnormal gait, decreased balance, decreased mobility, difficulty walking, and decreased strength.   PLAN:  PT FREQUENCY: 2x/week  PT DURATION: 8 weeks including eval week  PLANNED INTERVENTIONS: 97750- Physical Performance Testing, 97110-Therapeutic exercises, 97530- Therapeutic activity, 97112- Neuromuscular re-education, 97535- Self Care, [id number]- Manual therapy, [phone]- Gait training, Patient/Family education, and Balance training  PLAN FOR NEXT  SESSION: Continue to work on balance and strength, ankle mobility, hip mobility, consider assessing vertigo if remains ongoing issue (she notes better than 2 years ago, but occasional issues)   Tamar Miano April Ma L Lorine Iannaccone, PT 12/20/2023, 1:11 PM  [facility] at [facility] [address], [address] [place], [place] [address] Phone # [phone] Fax # [phone]

## 2023-12-22 ENCOUNTER — Ambulatory Visit

## 2023-12-22 DIAGNOSIS — R2689 Other abnormalities of gait and mobility: Secondary | ICD-10-CM

## 2023-12-22 DIAGNOSIS — R42 Dizziness and giddiness: Secondary | ICD-10-CM | POA: Diagnosis not present

## 2023-12-22 DIAGNOSIS — M6281 Muscle weakness (generalized): Secondary | ICD-10-CM | POA: Diagnosis not present

## 2023-12-22 DIAGNOSIS — R2681 Unsteadiness on feet: Secondary | ICD-10-CM | POA: Diagnosis not present

## 2023-12-22 NOTE — Therapy (Signed)
 OUTPATIENT PHYSICAL THERAPY NEURO TREATMENT   Patient Name: Kimberly [name] MRN: [id number] DOB:09/07/1940, 83 y.o., female Today's Date: 12/22/2023   PCP: Ransom Other, MD REFERRING PROVIDER: Onita Duos, MD   END OF SESSION:  PT End of Session - 12/22/23 1144     Visit Number 6    Number of Visits 16    Date for PT Re-Evaluation 01/13/24    Authorization Type [employer] Medicare-approved 16 PT visits from 11/21/2023-01/13/2024    Authorization - Visit Number 6    Authorization - Number of Visits 16    Progress Note Due on Visit 10    PT Start Time 1145    PT Stop Time 1230    PT Time Calculation (min) 45 min    Equipment Utilized During Treatment Gait belt    Activity Tolerance Patient tolerated treatment well    Behavior During Therapy WFL for tasks assessed/performed            Past Medical History:  Diagnosis Date   Allergy    Anal polyp 1998   Flex Sig    Anemia    Angular blepharitis of left eye    Ankle fracture    Stress fracture   Anxiety    Aortic atherosclerosis (HCC)    Asthma    border line has inhaler   Bunion    Cataracts, bilateral    Corneal scar    left eye   CPAP (continuous positive airway pressure) dependence    Degenerative disc disease    Depression    Diverticulosis of colon (without mention of hemorrhage) 2010   Colonoscopy   Dry eyes    bilateral   Enterocele    External hemorrhoids 2000   Colonoscopy   Family history of malignant neoplasm of gastrointestinal tract    Female cystocele    Fibromyalgia    GERD (gastroesophageal reflux disease)    Hiatal hernia 2005,2010   EGD   History of bronchitis    History of measles    History of mumps    History of strep sore throat    History of urinary tract infection    Hyperlipemia    Hypothyroidism    IBS (irritable bowel syndrome)    Imbalance    Internal hemorrhoids without mention of complication 1995,2005   Colonoscopy    Itching    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HCC)     Menopause    Migraine    OSA (obstructive sleep apnea) 05/14/2015   on BiPAP   Osteopenia 10/2013   T score -1.6 FRAX 10%/1.6%   PAF (paroxysmal atrial fibrillation) (HCC)    Pancreatitis    PCO (posterior capsular opacification)    left   Pneumonia    childhood illness   PONV (postoperative nausea and vomiting)    Pseudophakia, both eyes    PVD (posterior vitreous detachment) right   Rash    on back    Retinal scar    left   Rotator cuff disorder    pain, left shoulder   Shingles    Stricture and stenosis of esophagus 2005,2010   EGD    Stroke (HCC)    Ulcerative colitis (HCC)    Varicose veins    Vertigo    Wears glasses    Past Surgical History:  Procedure Laterality Date   ABDOMINAL HYSTERECTOMY     ANAL FISSURE REPAIR     BREAST EXCISIONAL BIOPSY Left    BUNIONECTOMY  2014   CATARACT EXTRACTION Bilateral 2013  COLONOSCOPY     polyp removed   ESOPHAGEAL DILATION     EYE SURGERY Left    FOOT SURGERY     left    HEMORRHOID SURGERY     MOUTH SURGERY  11/13/11   Cyst removed from gum-benign    NASAL SEPTUM SURGERY     NASAL SEPTUM SURGERY  1970's   REPLACEMENT TOTAL KNEE Right 2011   TONSILLECTOMY     TOTAL ABDOMINAL HYSTERECTOMY W/ BILATERAL SALPINGOOPHORECTOMY  1991   TAH BSO   TOTAL HIP ARTHROPLASTY     right    TOTAL KNEE ARTHROPLASTY     both   TOTAL KNEE ARTHROPLASTY Left 06/23/2015   Procedure: LEFT TOTAL KNEE ARTHROPLASTY;  Surgeon: Dempsey Moan, MD;  Location: [facility];  Service: Orthopedics;  Laterality: Left;   Patient Active Problem List   Diagnosis Date Noted   Gait abnormality 11/09/2023   Neuropathy 11/09/2023   Memory loss 11/09/2023   Chronic venous insufficiency 10/19/2022   Bilateral tinnitus 11/08/2021   Elevated blood-pressure reading, without diagnosis of hypertension 03/13/2021   Microscopic hematuria 11/26/2020   Chronic pain 11/10/2020   Eosinophilic esophagitis 11/10/2020   Generalized anxiety disorder 11/10/2020    Hardening of the aorta (main artery of the heart) (HCC) 11/10/2020   Osteoporosis 11/10/2020   Spondylolisthesis at L4-L5 level 11/10/2020   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   Corns and callosities 05/20/2019   Pain in both feet 05/20/2019   Chest pain 01/28/2019   Chest pain of uncertain etiology 12/25/2018   Adiposity 07/13/2018   Excess weight 07/13/2018   Hay fever 07/13/2018   PAF (paroxysmal atrial fibrillation) (HCC) 08/25/2017   Neck pain 12/30/2016   Chronic low back pain 08/12/2016   Syncope 05/13/2016   Heart palpitations 05/13/2016   Rhinitis, chronic 02/04/2016   IBS (irritable bowel syndrome) 07/14/2015   Nausea without vomiting 07/14/2015   OA (osteoarthritis) of knee 06/23/2015   OSA (obstructive sleep apnea) 05/14/2015   Acute back pain with sciatica 04/03/2015   Preoperative clearance 01/08/2015   Acute infection of nasal sinus 02/25/2014   Pancreatitis 07/22/2013   Leukocytosis 07/22/2013   Amblyopia 03/07/2013   Retinal scar 03/07/2013   Cellulitis 01/15/2013   Cerebrovascular small vessel disease 11/08/2012   Angular blepharitis 10/11/2012   Dry eyes 10/11/2012   PCO (posterior capsular opacification) 10/11/2012   PVD (posterior vitreous detachment) 10/11/2012   Crossover toe 09/20/2012   Cataracts, bilateral    Elevated cholesterol    Degenerative disc disease    Bunion    Ankle fracture    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HCC)    Hemorrhoid    DUB (dysfunctional uterine bleeding)    DIARRHEA 09/16/2009   History of colonic polyps 09/16/2009   LACUNAR INFARCTION 07/16/2008   CONSTIPATION 07/12/2008   CHEST PAIN 07/12/2008   DYSPHAGIA 07/12/2008   Hypothyroidism 07/10/2008   Anxiety state 07/10/2008   DEPRESSION 07/10/2008   INTERNAL HEMORRHOIDS 07/10/2008   ESOPHAGEAL STRICTURE 07/10/2008   GERD 07/10/2008   HIATAL HERNIA 07/10/2008   DIVERTICULOSIS, COLON 07/10/2008   IRRITABLE BOWEL SYNDROME 07/10/2008   Osteoarthritis 07/10/2008   FIBROMYALGIA  07/10/2008   Blues 07/10/2008    ONSET DATE: 11/09/2023 (MD referral)  REFERRING DIAG:  R41.3 (ICD-10-CM) - Memory loss  G62.9 (ICD-10-CM) - Neuropathy  R26.9 (ICD-10-CM) - Gait abnormality    THERAPY DIAG:  Unsteadiness on feet  Other abnormalities of gait and mobility  Muscle weakness (generalized)  Rationale for Evaluation and Treatment: Rehabilitation  SUBJECTIVE:  SUBJECTIVE STATEMENT: Have a painful corn on the foot that I'm working on Pt accompanied by: self  PERTINENT HISTORY: Dr. Georgianne note:  EMG limited due to swelling; worry about cervical spondylitic myelopathy-MRI ordered PMH of a-fib on Eliquis , lacunar infarction, memory loss, hypothyroidism, walking pneumonia; pt reported hx of Bilat TKR  PAIN:  Are you having pain? Yes: NPRS scale: 4-6/10 Pain location: BLEs, worse on the L (has a corn here) Pain description: deep, muscular ache Aggravating factors: being on feet too long Relieving factors: heat helps, elevating legs *burning in feet on 12/01/23  PRECAUTIONS: Fall  WEIGHT BEARING RESTRICTIONS: No  FALLS: Has patient fallen in last 6 months? No  LIVING ENVIRONMENT: Lives with: lives alone and family lives nearby Lives in: House/apartment Stairs: 2 steps to enter Has following equipment at home: Single point cane and Environmental consultant - 2 wheeled  PLOF: Independent  PATIENT GOALS: Feel safer, feel stronger with legs  OBJECTIVE:    TODAY'S TREATMENT: 12/22/23 Activity Comments  Nustep L5 x 6 min   Reports feeling lightheaded in standing   Vitals Seated: 138/74 mmHg, 60 bpm Standing: 111/67 mmHg, 61 bpm  Standing bicep curls Attempted with semi-tandem position  Continued lightheaded 109/67 mmHg, 62 bpm  Seated LE PRE 2x15 4#, green band     TODAY'S TREATMENT:   Activity Comments  Nustep L5 x 6 min  Had to stop intermittently due to increased L foot burning  Supine: Figure 4 stretch x 30 Piriformis stretch x 30 SLR 2x10 Clamshell red TB 2x10 Bridge 2x10 Ankle DF red TB 2x10 Pt's head elevated on wedge and 2 pillows due to her vertigo   Pt reported feeling less neuropathy after performing supine exercises  At counter EO feet together 2x30 EO feet together head turns/nods 2x30 each Intermittent UE support.  Started to increase burning in toes  Lumbar ext against counter Felt good for pt, reported some decrease in burning             PATIENT EDUCATION: Education details: Continue current HEP; progress towards goals and POC Person educated: Patient Education method: Explanation, Demonstration, Verbal cues, and Handouts Education comprehension: verbalized understanding, returned demonstration, and needs further education  HOME EXERCISE PROGRAM: Access Code: [email or web address] URL: [email or web address] Date: 12/01/2023 Prepared by: Tawni Ferrier  Exercises - Supine Piriformis Stretch with Foot on Ground  - 1 x daily - 5 x weekly - 1 sets - 3 reps - 20 seconds hold - Bent Knee Fallouts  - 1 x daily - 5 x weekly - 1 sets - 10 reps - Seated Ankle Pumps  - 1 x daily - 5 x weekly - 3 sets - 10 reps - Seated Long Arc Quad  - 1 x daily - 5 x weekly - 3 sets - 10 reps - Sit to Stand with Counter Support  - 1 x daily - 5 x weekly - 1 sets - 5 reps - Staggered Stance Forward Backward Weight Shift with Counter Support  - 1 x daily - 5 x weekly - 1 sets - 10 reps  ---------------------------------------------------- Note: Objective measures were completed at Evaluation unless otherwise noted.  DIAGNOSTIC FINDINGS: awaiting MRI  COGNITION: Overall cognitive status: Within functional limits for tasks assessed and History of cognitive impairments - at baseline   SENSATION: Light touch: Impaired  more distally  EDEMA:  +1  pitting edema BLEs, LLE > RLE  MUSCLE TONE: Generally stiff with P/ROM in BLEs   POSTURE: rounded shoulders and forward head  LOWER  EXTREMITY ROM:    Active  Right Eval Left Eval  Hip flexion    Hip extension    Hip abduction    Hip adduction    Hip internal rotation    Hip external rotation    Knee flexion    Knee extension -10 -20  Ankle dorsiflexion +10 + 2  Ankle plantarflexion    Ankle inversion    Ankle eversion     (Blank rows = not tested)  LOWER EXTREMITY MMT:   MMT Right Eval Left Eval  Hip flexion 4 4  Hip extension    Hip abduction 4 4  Hip adduction 4+ 4+  Hip internal rotation    Hip external rotation    Knee flexion 4 4  Knee extension 4 4  Ankle dorsiflexion 3+ 3+  Ankle plantarflexion    Ankle inversion    Ankle eversion    (Blank rows = not tested)  TRANSFERS: Sit to stand: Modified independence  Assistive device utilized: None     Stand to sit: Modified independence  Assistive device utilized: None       GAIT: Findings: Gait Characteristics: step through pattern, Distance walked: 50 ft x 2 , Assistive device utilized:Walker - 2 wheeled, and Level of assistance: SBA  FUNCTIONAL TESTS:  5 times sit to stand: 24.47 sec with UE support Timed up and go (TUG): 28.16 sec RW; 20.53 sec  10 meter walk test: 20.53 sec (1.6 ft/sec) Berg Balance:  TBA EO feet together:  30 sec mild sway EC feet together 20 sec moderate sway *Pt c/o sciatica type pain in RLE/piriformis area with short distance gait.                                                                                                                       GOALS: Goals reviewed with patient? Yes  SHORT TERM GOALS: Target date: 12/16/2023  Pt will be independent with HEP for improved balance, strength, gait. Baseline: Goal status: PARTIALLY MET, 12/13/2023  2.  Pt will improve 5x sit<>stand to less than or equal to 18 sec with UE support to demonstrate improved functional strength and  transfer efficiency. Baseline: 24.47 sec with BUE support>15 sec 12/13/2023 Goal status: MET, 12/13/2023  3.  Pt will improve TUG score to less than or equal to 20 sec with RW for decreased fall risk. Baseline: 28 sec>20 sec Goal status: MET, 12/13/2023  LONG TERM GOALS: Target date: 01/13/2024  Pt will be independent with HEP for improved strength, balance, gait. Baseline:  Goal status: INITIAL  2.  Pt will improve 5x sit<>stand to less than or equal to 15 sec with light to no UE support to demonstrate improved functional strength and transfer efficiency. Baseline: 24.47 sec with UE support Goal status: INITIAL  3.  Pt will improve TUG score to less than or equal to 15 sec with LRAD for decreased fall risk. Baseline:  Goal status: INITIAL  4.  Pt will improve Berg score to at  least 45/56 to decrease fall risk. Baseline: TBA Goal status: INITIAL  5.  Pt will verbalize understanding of fall prevention in home environment.  Baseline:  Goal status: INITIAL  6.  Pt will improve gait velocity to at least 2 ft/sec for improved gait efficiency and safety.  Baseline:  Goal status:  INITIAL  ASSESSMENT:  CLINICAL IMPRESSION: Session iniatietd w/ NU-step for warm-up and endurance. Proceeded with standing activity w/ pt report of feeling lightheaded and vitals reveal drop in systolic pressure when standing vs sitting. Pt educated in gentle hydration and benefits of compression stockings and demo of sock butler to improve ease of donning. Instructed in seated LE PRE for improved strength and for benefit of increased fluid return. Pt reports decreased lightheadedness when leaving clinic.  OBJECTIVE IMPAIRMENTS: Abnormal gait, decreased balance, decreased mobility, difficulty walking, and decreased strength.   PLAN:  PT FREQUENCY: 2x/week  PT DURATION: 8 weeks including eval week  PLANNED INTERVENTIONS: 97750- Physical Performance Testing, 97110-Therapeutic exercises, 97530- Therapeutic  activity, 97112- Neuromuscular re-education, 97535- Self Care, [id number]- Manual therapy, [phone]- Gait training, Patient/Family education, and Balance training  PLAN FOR NEXT SESSION: Continue to work on balance and strength, ankle mobility, hip mobility, consider assessing vertigo if remains ongoing issue (she notes better than 2 years ago, but occasional issues)   Jonette MARLA Sandifer, PT 12/22/2023, 11:45 AM  [facility] at [facility] [address], [address] [place], [place] [address] Phone # [phone] Fax # [phone]

## 2023-12-23 ENCOUNTER — Other Ambulatory Visit: Payer: Self-pay | Admitting: Cardiology

## 2023-12-26 ENCOUNTER — Ambulatory Visit: Admitting: Cardiology

## 2023-12-27 ENCOUNTER — Ambulatory Visit: Admitting: Physical Therapy

## 2023-12-27 ENCOUNTER — Encounter: Payer: Self-pay | Admitting: Physical Therapy

## 2023-12-27 DIAGNOSIS — R2681 Unsteadiness on feet: Secondary | ICD-10-CM

## 2023-12-27 DIAGNOSIS — R42 Dizziness and giddiness: Secondary | ICD-10-CM | POA: Diagnosis not present

## 2023-12-27 DIAGNOSIS — M6281 Muscle weakness (generalized): Secondary | ICD-10-CM | POA: Diagnosis not present

## 2023-12-27 DIAGNOSIS — R2689 Other abnormalities of gait and mobility: Secondary | ICD-10-CM | POA: Diagnosis not present

## 2023-12-27 NOTE — Therapy (Signed)
 OUTPATIENT PHYSICAL THERAPY NEURO TREATMENT   Patient Name: Kimberly [name] MRN: [id number] DOB:December 13, 1940, 83 y.o., female Today's Date: 12/27/2023   PCP: Ransom Other, MD REFERRING PROVIDER: Onita Duos, MD   END OF SESSION:  PT End of Session - 12/27/23 1306     Visit Number 7    Number of Visits 16    Date for PT Re-Evaluation 01/13/24    Authorization Type [employer] Medicare-approved 16 PT visits from 11/21/2023-01/13/2024    Authorization - Number of Visits 16    Progress Note Due on Visit 10    PT Start Time 1310    PT Stop Time 1350    PT Time Calculation (min) 40 min    Equipment Utilized During Treatment Gait belt    Activity Tolerance Patient tolerated treatment well    Behavior During Therapy WFL for tasks assessed/performed           Past Medical History:  Diagnosis Date   Allergy    Anal polyp 1998   Flex Sig    Anemia    Angular blepharitis of left eye    Ankle fracture    Stress fracture   Anxiety    Aortic atherosclerosis (HCC)    Asthma    border line has inhaler   Bunion    Cataracts, bilateral    Corneal scar    left eye   CPAP (continuous positive airway pressure) dependence    Degenerative disc disease    Depression    Diverticulosis of colon (without mention of hemorrhage) 2010   Colonoscopy   Dry eyes    bilateral   Enterocele    External hemorrhoids 2000   Colonoscopy   Family history of malignant neoplasm of gastrointestinal tract    Female cystocele    Fibromyalgia    GERD (gastroesophageal reflux disease)    Hiatal hernia 2005,2010   EGD   History of bronchitis    History of measles    History of mumps    History of strep sore throat    History of urinary tract infection    Hyperlipemia    Hypothyroidism    IBS (irritable bowel syndrome)    Imbalance    Internal hemorrhoids without mention of complication 1995,2005   Colonoscopy    Itching    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HCC)    Menopause    Migraine    OSA (obstructive  sleep apnea) 05/14/2015   on BiPAP   Osteopenia 10/2013   T score -1.6 FRAX 10%/1.6%   PAF (paroxysmal atrial fibrillation) (HCC)    Pancreatitis    PCO (posterior capsular opacification)    left   Pneumonia    childhood illness   PONV (postoperative nausea and vomiting)    Pseudophakia, both eyes    PVD (posterior vitreous detachment) right   Rash    on back    Retinal scar    left   Rotator cuff disorder    pain, left shoulder   Shingles    Stricture and stenosis of esophagus 2005,2010   EGD    Stroke (HCC)    Ulcerative colitis (HCC)    Varicose veins    Vertigo    Wears glasses    Past Surgical History:  Procedure Laterality Date   ABDOMINAL HYSTERECTOMY     ANAL FISSURE REPAIR     BREAST EXCISIONAL BIOPSY Left    BUNIONECTOMY  2014   CATARACT EXTRACTION Bilateral 2013   COLONOSCOPY     polyp removed  ESOPHAGEAL DILATION     EYE SURGERY Left    FOOT SURGERY     left    HEMORRHOID SURGERY     MOUTH SURGERY  11/13/11   Cyst removed from gum-benign    NASAL SEPTUM SURGERY     NASAL SEPTUM SURGERY  1970's   REPLACEMENT TOTAL KNEE Right 2011   TONSILLECTOMY     TOTAL ABDOMINAL HYSTERECTOMY W/ BILATERAL SALPINGOOPHORECTOMY  1991   TAH BSO   TOTAL HIP ARTHROPLASTY     right    TOTAL KNEE ARTHROPLASTY     both   TOTAL KNEE ARTHROPLASTY Left 06/23/2015   Procedure: LEFT TOTAL KNEE ARTHROPLASTY;  Surgeon: Dempsey Moan, MD;  Location: [facility];  Service: Orthopedics;  Laterality: Left;   Patient Active Problem List   Diagnosis Date Noted   Gait abnormality 11/09/2023   Neuropathy 11/09/2023   Memory loss 11/09/2023   Chronic venous insufficiency 10/19/2022   Bilateral tinnitus 11/08/2021   Elevated blood-pressure reading, without diagnosis of hypertension 03/13/2021   Microscopic hematuria 11/26/2020   Chronic pain 11/10/2020   Eosinophilic esophagitis 11/10/2020   Generalized anxiety disorder 11/10/2020   Hardening of the aorta (main artery of the heart)  (HCC) 11/10/2020   Osteoporosis 11/10/2020   Spondylolisthesis at L4-L5 level 11/10/2020   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   Corns and callosities 05/20/2019   Pain in both feet 05/20/2019   Chest pain 01/28/2019   Chest pain of uncertain etiology 12/25/2018   Adiposity 07/13/2018   Excess weight 07/13/2018   Hay fever 07/13/2018   PAF (paroxysmal atrial fibrillation) (HCC) 08/25/2017   Neck pain 12/30/2016   Chronic low back pain 08/12/2016   Syncope 05/13/2016   Heart palpitations 05/13/2016   Rhinitis, chronic 02/04/2016   IBS (irritable bowel syndrome) 07/14/2015   Nausea without vomiting 07/14/2015   OA (osteoarthritis) of knee 06/23/2015   OSA (obstructive sleep apnea) 05/14/2015   Acute back pain with sciatica 04/03/2015   Preoperative clearance 01/08/2015   Acute infection of nasal sinus 02/25/2014   Pancreatitis 07/22/2013   Leukocytosis 07/22/2013   Amblyopia 03/07/2013   Retinal scar 03/07/2013   Cellulitis 01/15/2013   Cerebrovascular small vessel disease 11/08/2012   Angular blepharitis 10/11/2012   Dry eyes 10/11/2012   PCO (posterior capsular opacification) 10/11/2012   PVD (posterior vitreous detachment) 10/11/2012   Crossover toe 09/20/2012   Cataracts, bilateral    Elevated cholesterol    Degenerative disc disease    Bunion    Ankle fracture    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HCC)    Hemorrhoid    DUB (dysfunctional uterine bleeding)    DIARRHEA 09/16/2009   History of colonic polyps 09/16/2009   LACUNAR INFARCTION 07/16/2008   CONSTIPATION 07/12/2008   CHEST PAIN 07/12/2008   DYSPHAGIA 07/12/2008   Hypothyroidism 07/10/2008   Anxiety state 07/10/2008   DEPRESSION 07/10/2008   INTERNAL HEMORRHOIDS 07/10/2008   ESOPHAGEAL STRICTURE 07/10/2008   GERD 07/10/2008   HIATAL HERNIA 07/10/2008   DIVERTICULOSIS, COLON 07/10/2008   IRRITABLE BOWEL SYNDROME 07/10/2008   Osteoarthritis 07/10/2008   FIBROMYALGIA 07/10/2008   Blues 07/10/2008    ONSET DATE: 11/09/2023  (MD referral)  REFERRING DIAG:  R41.3 (ICD-10-CM) - Memory loss  G62.9 (ICD-10-CM) - Neuropathy  R26.9 (ICD-10-CM) - Gait abnormality    THERAPY DIAG:  Unsteadiness on feet  Other abnormalities of gait and mobility  Muscle weakness (generalized)  Dizziness and giddiness  Rationale for Evaluation and Treatment: Rehabilitation  SUBJECTIVE:  SUBJECTIVE STATEMENT: Pt states she is feeling some better today. Only complaint is her knees are a little tight. Reports her allergies have been bothering her more today.  Pt accompanied by: self  PERTINENT HISTORY: Dr. Georgianne note:  EMG limited due to swelling; worry about cervical spondylitic myelopathy-MRI ordered PMH of a-fib on Eliquis , lacunar infarction, memory loss, hypothyroidism, walking pneumonia; pt reported hx of Bilat TKR  PAIN:  Are you having pain? Yes: NPRS scale: 4-6/10 Pain location: BLEs, worse on the L (has a corn here) Pain description: deep, muscular ache Aggravating factors: being on feet too long Relieving factors: heat helps, elevating legs  PRECAUTIONS: Fall  WEIGHT BEARING RESTRICTIONS: No  FALLS: Has patient fallen in last 6 months? No  LIVING ENVIRONMENT: Lives with: lives alone and family lives nearby Lives in: House/apartment Stairs: 2 steps to enter Has following equipment at home: Single point cane and Environmental consultant - 2 wheeled  PLOF: Independent  PATIENT GOALS: Feel safer, feel stronger with legs  OBJECTIVE:    TODAY'S TREATMENT: 12/27/23 Activity Comments  Nustep L5 x 6 min A little lightheaded   Vitals post nustep Seated: 113/79 mmHg, 74 bpm Standing: 78/54 mmHg, 73 bpm (can feel some lightheadedness)  Vitals after water  and cereal bar  Seated: 107/68 mmHg, 68 bpm Standing: 87/57 mmHg, 70 bpm Standing after  2 min: 93/65 mmHg, 71 bpm  Marching in place x 30 sec Standing: 96/59 mmHg, 71 bpm  UE elevation x10 Standing hip abd x10  Standing: 90/59 mmHg, 72 bpm Sitting: 122/71 mmHg, 70 bpm        TODAY'S TREATMENT:  Activity Comments  Nustep L5 x 6 min  Had to stop intermittently due to increased L foot burning  Supine: Figure 4 stretch x 30 Piriformis stretch x 30 SLR 2x10 Clamshell red TB 2x10 Bridge 2x10 Ankle DF red TB 2x10 Pt's head elevated on wedge and 2 pillows due to her vertigo   Pt reported feeling less neuropathy after performing supine exercises  At counter EO feet together 2x30 EO feet together head turns/nods 2x30 each Intermittent UE support.  Started to increase burning in toes  Lumbar ext against counter Felt good for pt, reported some decrease in burning             PATIENT EDUCATION: Education details: Continue current HEP; progress towards goals and POC Person educated: Patient Education method: Explanation, Demonstration, Verbal cues, and Handouts Education comprehension: verbalized understanding, returned demonstration, and needs further education  HOME EXERCISE PROGRAM: Access Code: [email or web address] URL: [email or web address] Date: 12/01/2023 Prepared by: Tawni Ferrier  Exercises - Supine Piriformis Stretch with Foot on Ground  - 1 x daily - 5 x weekly - 1 sets - 3 reps - 20 seconds hold - Bent Knee Fallouts  - 1 x daily - 5 x weekly - 1 sets - 10 reps - Seated Ankle Pumps  - 1 x daily - 5 x weekly - 3 sets - 10 reps - Seated Long Arc Quad  - 1 x daily - 5 x weekly - 3 sets - 10 reps - Sit to Stand with Counter Support  - 1 x daily - 5 x weekly - 1 sets - 5 reps - Staggered Stance Forward Backward Weight Shift with Counter Support  - 1 x daily - 5 x weekly - 1 sets - 10 reps  ---------------------------------------------------- Note: Objective measures were completed at Evaluation unless otherwise noted.  DIAGNOSTIC FINDINGS:  awaiting MRI  COGNITION: Overall cognitive status: Within functional  limits for tasks assessed and History of cognitive impairments - at baseline   SENSATION: Light touch: Impaired  more distally  EDEMA:  +1 pitting edema BLEs, LLE > RLE  MUSCLE TONE: Generally stiff with P/ROM in BLEs   POSTURE: rounded shoulders and forward head  LOWER EXTREMITY ROM:    Active  Right Eval Left Eval  Hip flexion    Hip extension    Hip abduction    Hip adduction    Hip internal rotation    Hip external rotation    Knee flexion    Knee extension -10 -20  Ankle dorsiflexion +10 + 2  Ankle plantarflexion    Ankle inversion    Ankle eversion     (Blank rows = not tested)  LOWER EXTREMITY MMT:   MMT Right Eval Left Eval  Hip flexion 4 4  Hip extension    Hip abduction 4 4  Hip adduction 4+ 4+  Hip internal rotation    Hip external rotation    Knee flexion 4 4  Knee extension 4 4  Ankle dorsiflexion 3+ 3+  Ankle plantarflexion    Ankle inversion    Ankle eversion    (Blank rows = not tested)  TRANSFERS: Sit to stand: Modified independence  Assistive device utilized: None     Stand to sit: Modified independence  Assistive device utilized: None       GAIT: Findings: Gait Characteristics: step through pattern, Distance walked: 50 ft x 2 , Assistive device utilized:Walker - 2 wheeled, and Level of assistance: SBA  FUNCTIONAL TESTS:  5 times sit to stand: 24.47 sec with UE support Timed up and go (TUG): 28.16 sec RW; 20.53 sec  10 meter walk test: 20.53 sec (1.6 ft/sec) Berg Balance:  TBA EO feet together:  30 sec mild sway EC feet together 20 sec moderate sway *Pt c/o sciatica type pain in RLE/piriformis area with short distance gait.                                                                                                                       GOALS: Goals reviewed with patient? Yes  SHORT TERM GOALS: Target date: 12/16/2023  Pt will be independent with HEP for  improved balance, strength, gait. Baseline: Goal status: PARTIALLY MET, 12/13/2023  2.  Pt will improve 5x sit<>stand to less than or equal to 18 sec with UE support to demonstrate improved functional strength and transfer efficiency. Baseline: 24.47 sec with BUE support>15 sec 12/13/2023 Goal status: MET, 12/13/2023  3.  Pt will improve TUG score to less than or equal to 20 sec with RW for decreased fall risk. Baseline: 28 sec>20 sec Goal status: MET, 12/13/2023  LONG TERM GOALS: Target date: 01/13/2024  Pt will be independent with HEP for improved strength, balance, gait. Baseline:  Goal status: INITIAL  2.  Pt will improve 5x sit<>stand to less than or equal to 15 sec with light to no UE support to demonstrate improved functional  strength and transfer efficiency. Baseline: 24.47 sec with UE support Goal status: INITIAL  3.  Pt will improve TUG score to less than or equal to 15 sec with LRAD for decreased fall risk. Baseline:  Goal status: INITIAL  4.  Pt will improve Berg score to at least 45/56 to decrease fall risk. Baseline: TBA Goal status: INITIAL  5.  Pt will verbalize understanding of fall prevention in home environment.  Baseline:  Goal status: INITIAL  6.  Pt will improve gait velocity to at least 2 ft/sec for improved gait efficiency and safety.  Baseline:  Goal status:  INITIAL  ASSESSMENT:  CLINICAL IMPRESSION: Pt comes in complaining of allergies and some lightheadedness. Found pt to have some continued orthostatic hypotension. Session focused on trying to get pt's BP back up -- she was not hugely symptomatic but standing systolic BP would not get within 20 mmHg of her sitting BP. Educated pt on importance of eating and drinking prior to coming in to PT session. Pt states she had a small dinner and very small breakfast today and did not hydrate enough. Ended session early with instructions for pt to eat and then recheck her BP at home.   OBJECTIVE IMPAIRMENTS:  Abnormal gait, decreased balance, decreased mobility, difficulty walking, and decreased strength.   PLAN:  PT FREQUENCY: 2x/week  PT DURATION: 8 weeks including eval week  PLANNED INTERVENTIONS: 97750- Physical Performance Testing, 97110-Therapeutic exercises, 97530- Therapeutic activity, 97112- Neuromuscular re-education, 97535- Self Care, [id number]- Manual therapy, [phone]- Gait training, Patient/Family education, and Balance training  PLAN FOR NEXT SESSION: Continue to work on balance and strength, ankle mobility, hip mobility, consider assessing vertigo if remains ongoing issue (she notes better than 2 years ago, but occasional issues)   Avaleigh Decuir April Ma L Gary Gabrielsen, PT, DPT 12/27/2023, 1:06 PM  [facility] [address], [address] [place], [place] [address] Phone # [phone] Fax # [phone]

## 2023-12-29 ENCOUNTER — Ambulatory Visit

## 2024-01-03 ENCOUNTER — Ambulatory Visit

## 2024-01-05 DIAGNOSIS — I480 Paroxysmal atrial fibrillation: Secondary | ICD-10-CM | POA: Diagnosis not present

## 2024-01-05 DIAGNOSIS — E538 Deficiency of other specified B group vitamins: Secondary | ICD-10-CM | POA: Diagnosis not present

## 2024-01-05 DIAGNOSIS — I872 Venous insufficiency (chronic) (peripheral): Secondary | ICD-10-CM | POA: Diagnosis not present

## 2024-01-05 DIAGNOSIS — R269 Unspecified abnormalities of gait and mobility: Secondary | ICD-10-CM | POA: Diagnosis not present

## 2024-01-05 DIAGNOSIS — E039 Hypothyroidism, unspecified: Secondary | ICD-10-CM | POA: Diagnosis not present

## 2024-01-05 DIAGNOSIS — G4733 Obstructive sleep apnea (adult) (pediatric): Secondary | ICD-10-CM | POA: Diagnosis not present

## 2024-01-05 DIAGNOSIS — F0150 Vascular dementia without behavioral disturbance: Secondary | ICD-10-CM | POA: Diagnosis not present

## 2024-01-05 DIAGNOSIS — M810 Age-related osteoporosis without current pathological fracture: Secondary | ICD-10-CM | POA: Diagnosis not present

## 2024-01-05 DIAGNOSIS — I700 Atherosclerosis of aorta: Secondary | ICD-10-CM | POA: Diagnosis not present

## 2024-01-06 ENCOUNTER — Ambulatory Visit: Admitting: Physical Therapy

## 2024-01-06 NOTE — Therapy (Signed)
 OUTPATIENT PHYSICAL THERAPY NEURO TREATMENT   Patient Name: Kimberly [name] MRN: [id number] DOB:09-Jul-1940, 83 y.o., female Today's Date: 01/09/2024   PCP: Ransom Other, MD REFERRING PROVIDER: Onita Duos, MD   END OF SESSION:  PT End of Session - 01/09/24 1429     Visit Number 8    Number of Visits 16    Date for PT Re-Evaluation 01/13/24    Authorization Type [employer] Medicare-approved 16 PT visits from 11/21/2023-01/13/2024    Authorization - Visit Number 7    Authorization - Number of Visits 16    Progress Note Due on Visit 10    PT Start Time 1406    PT Stop Time 1446    PT Time Calculation (min) 40 min    Equipment Utilized During Treatment Gait belt    Activity Tolerance Patient tolerated treatment well    Behavior During Therapy WFL for tasks assessed/performed            Past Medical History:  Diagnosis Date   Allergy    Anal polyp 1998   Flex Sig    Anemia    Angular blepharitis of left eye    Ankle fracture    Stress fracture   Anxiety    Aortic atherosclerosis (HCC)    Asthma    border line has inhaler   Bunion    Cataracts, bilateral    Corneal scar    left eye   CPAP (continuous positive airway pressure) dependence    Degenerative disc disease    Depression    Diverticulosis of colon (without mention of hemorrhage) 2010   Colonoscopy   Dry eyes    bilateral   Enterocele    External hemorrhoids 2000   Colonoscopy   Family history of malignant neoplasm of gastrointestinal tract    Female cystocele    Fibromyalgia    GERD (gastroesophageal reflux disease)    Hiatal hernia 2005,2010   EGD   History of bronchitis    History of measles    History of mumps    History of strep sore throat    History of urinary tract infection    Hyperlipemia    Hypothyroidism    IBS (irritable bowel syndrome)    Imbalance    Internal hemorrhoids without mention of complication 1995,2005   Colonoscopy    Itching    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HCC)     Menopause    Migraine    OSA (obstructive sleep apnea) 05/14/2015   on BiPAP   Osteopenia 10/2013   T score -1.6 FRAX 10%/1.6%   PAF (paroxysmal atrial fibrillation) (HCC)    Pancreatitis    PCO (posterior capsular opacification)    left   Pneumonia    childhood illness   PONV (postoperative nausea and vomiting)    Pseudophakia, both eyes    PVD (posterior vitreous detachment) right   Rash    on back    Retinal scar    left   Rotator cuff disorder    pain, left shoulder   Shingles    Stricture and stenosis of esophagus 2005,2010   EGD    Stroke (HCC)    Ulcerative colitis (HCC)    Varicose veins    Vertigo    Wears glasses    Past Surgical History:  Procedure Laterality Date   ABDOMINAL HYSTERECTOMY     ANAL FISSURE REPAIR     BREAST EXCISIONAL BIOPSY Left    BUNIONECTOMY  2014   CATARACT EXTRACTION Bilateral 2013  COLONOSCOPY     polyp removed   ESOPHAGEAL DILATION     EYE SURGERY Left    FOOT SURGERY     left    HEMORRHOID SURGERY     MOUTH SURGERY  11/13/11   Cyst removed from gum-benign    NASAL SEPTUM SURGERY     NASAL SEPTUM SURGERY  1970's   REPLACEMENT TOTAL KNEE Right 2011   TONSILLECTOMY     TOTAL ABDOMINAL HYSTERECTOMY W/ BILATERAL SALPINGOOPHORECTOMY  1991   TAH BSO   TOTAL HIP ARTHROPLASTY     right    TOTAL KNEE ARTHROPLASTY     both   TOTAL KNEE ARTHROPLASTY Left 06/23/2015   Procedure: LEFT TOTAL KNEE ARTHROPLASTY;  Surgeon: Dempsey Moan, MD;  Location: [facility];  Service: Orthopedics;  Laterality: Left;   Patient Active Problem List   Diagnosis Date Noted   Gait abnormality 11/09/2023   Neuropathy 11/09/2023   Memory loss 11/09/2023   Chronic venous insufficiency 10/19/2022   Bilateral tinnitus 11/08/2021   Elevated blood-pressure reading, without diagnosis of hypertension 03/13/2021   Microscopic hematuria 11/26/2020   Chronic pain 11/10/2020   Eosinophilic esophagitis 11/10/2020   Generalized anxiety disorder 11/10/2020    Hardening of the aorta (main artery of the heart) (HCC) 11/10/2020   Osteoporosis 11/10/2020   Spondylolisthesis at L4-L5 level 11/10/2020   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   Corns and callosities 05/20/2019   Pain in both feet 05/20/2019   Chest pain 01/28/2019   Chest pain of uncertain etiology 12/25/2018   Adiposity 07/13/2018   Excess weight 07/13/2018   Hay fever 07/13/2018   PAF (paroxysmal atrial fibrillation) (HCC) 08/25/2017   Neck pain 12/30/2016   Chronic low back pain 08/12/2016   Syncope 05/13/2016   Heart palpitations 05/13/2016   Rhinitis, chronic 02/04/2016   IBS (irritable bowel syndrome) 07/14/2015   Nausea without vomiting 07/14/2015   OA (osteoarthritis) of knee 06/23/2015   OSA (obstructive sleep apnea) 05/14/2015   Acute back pain with sciatica 04/03/2015   Preoperative clearance 01/08/2015   Acute infection of nasal sinus 02/25/2014   Pancreatitis 07/22/2013   Leukocytosis 07/22/2013   Amblyopia 03/07/2013   Retinal scar 03/07/2013   Cellulitis 01/15/2013   Cerebrovascular small vessel disease 11/08/2012   Angular blepharitis 10/11/2012   Dry eyes 10/11/2012   PCO (posterior capsular opacification) 10/11/2012   PVD (posterior vitreous detachment) 10/11/2012   Crossover toe 09/20/2012   Cataracts, bilateral    Elevated cholesterol    Degenerative disc disease    Bunion    Ankle fracture    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HCC)    Hemorrhoid    DUB (dysfunctional uterine bleeding)    DIARRHEA 09/16/2009   History of colonic polyps 09/16/2009   LACUNAR INFARCTION 07/16/2008   CONSTIPATION 07/12/2008   CHEST PAIN 07/12/2008   DYSPHAGIA 07/12/2008   Hypothyroidism 07/10/2008   Anxiety state 07/10/2008   DEPRESSION 07/10/2008   INTERNAL HEMORRHOIDS 07/10/2008   ESOPHAGEAL STRICTURE 07/10/2008   GERD 07/10/2008   HIATAL HERNIA 07/10/2008   DIVERTICULOSIS, COLON 07/10/2008   IRRITABLE BOWEL SYNDROME 07/10/2008   Osteoarthritis 07/10/2008   FIBROMYALGIA  07/10/2008   Blues 07/10/2008    ONSET DATE: 11/09/2023 (MD referral)  REFERRING DIAG:  R41.3 (ICD-10-CM) - Memory loss  G62.9 (ICD-10-CM) - Neuropathy  R26.9 (ICD-10-CM) - Gait abnormality    THERAPY DIAG:  Unsteadiness on feet  Other abnormalities of gait and mobility  Muscle weakness (generalized)  Dizziness and giddiness  Rationale for Evaluation and Treatment: Rehabilitation  SUBJECTIVE:                                                                                                                                                                                             SUBJECTIVE STATEMENT: Have some neuropathy and a corn on the R foot. Reports that she has been trying to drink more.    Pt accompanied by: self  PERTINENT HISTORY: Dr. Georgianne note:  EMG limited due to swelling; worry about cervical spondylitic myelopathy-MRI ordered PMH of a-fib on Eliquis , lacunar infarction, memory loss, hypothyroidism, walking pneumonia; pt reported hx of Bilat TKR  PAIN:  Are you having pain? Yes: NPRS scale: 3-4/10 Pain location: R foot Pain description: constant Aggravating factors: being on feet too long Relieving factors: heat helps, elevating legs  PRECAUTIONS: Fall  WEIGHT BEARING RESTRICTIONS: No  FALLS: Has patient fallen in last 6 months? No  LIVING ENVIRONMENT: Lives with: lives alone and family lives nearby Lives in: House/apartment Stairs: 2 steps to enter Has following equipment at home: Single point cane and Environmental consultant - 2 wheeled  PLOF: Independent  PATIENT GOALS: Feel safer, feel stronger with legs  OBJECTIVE:     TODAY'S TREATMENT: 01/09/24 Activity Comments  Vitals at start of session in sitting  95% spO2, 110/71 mmHg, 70 bpm  Pt denies dizziness   standing march 2x20 , 2nd set with 2# Using RW for support; pt reports perception of wobbly however pt appears steady and safe  standing hip ABD,2x10 , 2nd set with 2# Using RW for support; pt  reports perception of wobbly however pt appears steady and safe  standing hip EX 2x10, 2nd set with 2# Good form  After 2nd set pt reported mild lightheadedness and was offered sitting break: 97% spO2, 70bpm, 129/27mmHg   standing HS curl 2x10, 2nd set with 2# Good form   mini squat 2x10  With RW; cues to lead with hips/bottom  Nustep L4 x 6 min UEs LEs  For LE strengthening           PATIENT EDUCATION: Education details: answered pt's questions about POC, reinforced edu to increase hydration  Person educated: Patient Education method: Explanation Education comprehension: verbalized understanding    HOME EXERCISE PROGRAM: Access Code: [email or web address] URL: [email or web address] Date: 12/01/2023 Prepared by: Tawni Ferrier  Exercises - Supine Piriformis Stretch with Foot on Ground  - 1 x daily - 5 x weekly - 1 sets - 3 reps - 20 seconds hold - Bent Knee Fallouts  - 1 x daily - 5 x weekly - 1 sets - 10 reps - Seated Ankle Pumps  - 1 x daily -  5 x weekly - 3 sets - 10 reps - Seated Long Arc Quad  - 1 x daily - 5 x weekly - 3 sets - 10 reps - Sit to Stand with Counter Support  - 1 x daily - 5 x weekly - 1 sets - 5 reps - Staggered Stance Forward Backward Weight Shift with Counter Support  - 1 x daily - 5 x weekly - 1 sets - 10 reps  ---------------------------------------------------- Note: Objective measures were completed at Evaluation unless otherwise noted.  DIAGNOSTIC FINDINGS: awaiting MRI  COGNITION: Overall cognitive status: Within functional limits for tasks assessed and History of cognitive impairments - at baseline   SENSATION: Light touch: Impaired  more distally  EDEMA:  +1 pitting edema BLEs, LLE > RLE  MUSCLE TONE: Generally stiff with P/ROM in BLEs   POSTURE: rounded shoulders and forward head  LOWER EXTREMITY ROM:    Active  Right Eval Left Eval  Hip flexion    Hip extension    Hip abduction    Hip adduction    Hip internal rotation     Hip external rotation    Knee flexion    Knee extension -10 -20  Ankle dorsiflexion +10 + 2  Ankle plantarflexion    Ankle inversion    Ankle eversion     (Blank rows = not tested)  LOWER EXTREMITY MMT:   MMT Right Eval Left Eval  Hip flexion 4 4  Hip extension    Hip abduction 4 4  Hip adduction 4+ 4+  Hip internal rotation    Hip external rotation    Knee flexion 4 4  Knee extension 4 4  Ankle dorsiflexion 3+ 3+  Ankle plantarflexion    Ankle inversion    Ankle eversion    (Blank rows = not tested)  TRANSFERS: Sit to stand: Modified independence  Assistive device utilized: None     Stand to sit: Modified independence  Assistive device utilized: None       GAIT: Findings: Gait Characteristics: step through pattern, Distance walked: 50 ft x 2 , Assistive device utilized:Walker - 2 wheeled, and Level of assistance: SBA  FUNCTIONAL TESTS:  5 times sit to stand: 24.47 sec with UE support Timed up and go (TUG): 28.16 sec RW; 20.53 sec  10 meter walk test: 20.53 sec (1.6 ft/sec) Berg Balance:  TBA EO feet together:  30 sec mild sway EC feet together 20 sec moderate sway *Pt c/o sciatica type pain in RLE/piriformis area with short distance gait.                                                                                                                       GOALS: Goals reviewed with patient? Yes  SHORT TERM GOALS: Target date: 12/16/2023  Pt will be independent with HEP for improved balance, strength, gait. Baseline: Goal status: PARTIALLY MET, 12/13/2023  2.  Pt will improve 5x sit<>stand to less than or equal to 18 sec with UE support  to demonstrate improved functional strength and transfer efficiency. Baseline: 24.47 sec with BUE support>15 sec 12/13/2023 Goal status: MET, 12/13/2023  3.  Pt will improve TUG score to less than or equal to 20 sec with RW for decreased fall risk. Baseline: 28 sec>20 sec Goal status: MET, 12/13/2023  LONG TERM GOALS: Target  date: 01/13/2024  Pt will be independent with HEP for improved strength, balance, gait. Baseline:  Goal status: INITIAL  2.  Pt will improve 5x sit<>stand to less than or equal to 15 sec with light to no UE support to demonstrate improved functional strength and transfer efficiency. Baseline: 24.47 sec with UE support Goal status: INITIAL  3.  Pt will improve TUG score to less than or equal to 15 sec with LRAD for decreased fall risk. Baseline:  Goal status: INITIAL  4.  Pt will improve Berg score to at least 45/56 to decrease fall risk. Baseline: TBA Goal status: INITIAL  5.  Pt will verbalize understanding of fall prevention in home environment.  Baseline:  Goal status: INITIAL  6.  Pt will improve gait velocity to at least 2 ft/sec for improved gait efficiency and safety.  Baseline:  Goal status:  INITIAL  ASSESSMENT:  CLINICAL IMPRESSION: Patient arrived to session with report of some R foot pain- cites neuropathy and a corn on the R foot. Vitals WFL at start of session and patient asymptomatic. Patient performed standing LE strengthening exercises to improve standing tolerance and LE strength. Patient reports perception of feeling unsteady but appeared safe with UE support. Patient did note mild lightheadedness after several of today's activities- vitals recheck was again normal.  Proceeded with session without remaining complaints.   OBJECTIVE IMPAIRMENTS: Abnormal gait, decreased balance, decreased mobility, difficulty walking, and decreased strength.   PLAN:  PT FREQUENCY: 2x/week  PT DURATION: 8 weeks including eval week  PLANNED INTERVENTIONS: 97750- Physical Performance Testing, 97110-Therapeutic exercises, 97530- Therapeutic activity, 97112- Neuromuscular re-education, 97535- Self Care, [id number]- Manual therapy, [phone]- Gait training, Patient/Family education, and Balance training  PLAN FOR NEXT SESSION: recert vs. DC next session; Continue to work on balance and  strength, ankle mobility, hip mobility, consider assessing vertigo if remains ongoing issue (she notes better than 2 years ago, but occasional issues)   Louana Terrilyn Christians, PT, DPT 01/09/24 2:47 PM  [facility] at [facility] [address], [address] [place], [place] [address] Phone # [phone] Fax # [phone]

## 2024-01-09 ENCOUNTER — Encounter: Payer: Self-pay | Admitting: Physical Therapy

## 2024-01-09 ENCOUNTER — Ambulatory Visit: Attending: Neurology | Admitting: Physical Therapy

## 2024-01-09 DIAGNOSIS — R42 Dizziness and giddiness: Secondary | ICD-10-CM | POA: Diagnosis not present

## 2024-01-09 DIAGNOSIS — R2689 Other abnormalities of gait and mobility: Secondary | ICD-10-CM | POA: Diagnosis not present

## 2024-01-09 DIAGNOSIS — M6281 Muscle weakness (generalized): Secondary | ICD-10-CM | POA: Diagnosis not present

## 2024-01-09 DIAGNOSIS — R2681 Unsteadiness on feet: Secondary | ICD-10-CM | POA: Insufficient documentation

## 2024-01-10 ENCOUNTER — Ambulatory Visit: Admitting: Podiatry

## 2024-01-12 ENCOUNTER — Ambulatory Visit: Admitting: Physical Therapy

## 2024-01-12 ENCOUNTER — Encounter: Payer: Self-pay | Admitting: Physical Therapy

## 2024-01-12 DIAGNOSIS — R2681 Unsteadiness on feet: Secondary | ICD-10-CM | POA: Diagnosis not present

## 2024-01-12 DIAGNOSIS — M6281 Muscle weakness (generalized): Secondary | ICD-10-CM | POA: Diagnosis not present

## 2024-01-12 DIAGNOSIS — R2689 Other abnormalities of gait and mobility: Secondary | ICD-10-CM

## 2024-01-12 DIAGNOSIS — R42 Dizziness and giddiness: Secondary | ICD-10-CM | POA: Diagnosis not present

## 2024-01-12 NOTE — Therapy (Signed)
 OUTPATIENT PHYSICAL THERAPY NEURO TREATMENT/DISCHARGE SUMMARY   Patient Name: Kimberly [name] MRN: [id number] DOB:[birth year], 83 y.o., female Today's Date: 01/12/2024   PCP: Ransom Other, MD REFERRING PROVIDER: Onita Duos, MD   PHYSICAL THERAPY DISCHARGE SUMMARY  Visits from Start of Care: 9  Current functional level related to goals / functional outcomes: Pt has met 1 LTG and has partially met 4 LTGs.  See below   Remaining deficits: Balance, functional strength   Education / Equipment: HEP initiated, pt needs cues for reminders and reports not doing consistently   Patient agrees to discharge. Patient goals were partially met. Patient is being discharged due to being pleased with the current functional level.   END OF SESSION:  PT End of Session - 01/12/24 1357     Visit Number 9    Number of Visits 16    Date for PT Re-Evaluation 01/13/24    Authorization Type [employer] Medicare-approved 16 PT visits from 11/21/2023-01/13/2024    Authorization - Visit Number 8    Authorization - Number of Visits 16    Progress Note Due on Visit 10    PT Start Time 1400    PT Stop Time 1441    PT Time Calculation (min) 41 min    Equipment Utilized During Treatment Gait belt    Activity Tolerance Patient tolerated treatment well    Behavior During Therapy WFL for tasks assessed/performed             Past Medical History:  Diagnosis Date   Allergy    Anal polyp 1998   Flex Sig    Anemia    Angular blepharitis of left eye    Ankle fracture    Stress fracture   Anxiety    Aortic atherosclerosis (HCC)    Asthma    border line has inhaler   Bunion    Cataracts, bilateral    Corneal scar    left eye   CPAP (continuous positive airway pressure) dependence    Degenerative disc disease    Depression    Diverticulosis of colon (without mention of hemorrhage) 2010   Colonoscopy   Dry eyes    bilateral   Enterocele    External hemorrhoids 2000   Colonoscopy    Family history of malignant neoplasm of gastrointestinal tract    Female cystocele    Fibromyalgia    GERD (gastroesophageal reflux disease)    Hiatal hernia 2005,2010   EGD   History of bronchitis    History of measles    History of mumps    History of strep sore throat    History of urinary tract infection    Hyperlipemia    Hypothyroidism    IBS (irritable bowel syndrome)    Imbalance    Internal hemorrhoids without mention of complication 1995,2005   Colonoscopy    Itching    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HCC)    Menopause    Migraine    OSA (obstructive sleep apnea) 05/14/2015   on BiPAP   Osteopenia 10/2013   T score -1.6 FRAX 10%/1.6%   PAF (paroxysmal atrial fibrillation) (HCC)    Pancreatitis    PCO (posterior capsular opacification)    left   Pneumonia    childhood illness   PONV (postoperative nausea and vomiting)    Pseudophakia, both eyes    PVD (posterior vitreous detachment) right   Rash    on back    Retinal scar    left   Rotator cuff disorder  pain, left shoulder   Shingles    Stricture and stenosis of esophagus 2005,2010   EGD    Stroke (HCC)    Ulcerative colitis (HCC)    Varicose veins    Vertigo    Wears glasses    Past Surgical History:  Procedure Laterality Date   ABDOMINAL HYSTERECTOMY     ANAL FISSURE REPAIR     BREAST EXCISIONAL BIOPSY Left    BUNIONECTOMY  2014   CATARACT EXTRACTION Bilateral 2013   COLONOSCOPY     polyp removed   ESOPHAGEAL DILATION     EYE SURGERY Left    FOOT SURGERY     left    HEMORRHOID SURGERY     MOUTH SURGERY  11/13/11   Cyst removed from gum-benign    NASAL SEPTUM SURGERY     NASAL SEPTUM SURGERY  1970's   REPLACEMENT TOTAL KNEE Right 2011   TONSILLECTOMY     TOTAL ABDOMINAL HYSTERECTOMY W/ BILATERAL SALPINGOOPHORECTOMY  1991   TAH BSO   TOTAL HIP ARTHROPLASTY     right    TOTAL KNEE ARTHROPLASTY     both   TOTAL KNEE ARTHROPLASTY Left 06/23/2015   Procedure: LEFT TOTAL KNEE ARTHROPLASTY;  Surgeon:  Dempsey Moan, MD;  Location: [facility];  Service: Orthopedics;  Laterality: Left;   Patient Active Problem List   Diagnosis Date Noted   Gait abnormality 11/09/2023   Neuropathy 11/09/2023   Memory loss 11/09/2023   Chronic venous insufficiency 10/19/2022   Bilateral tinnitus 11/08/2021   Elevated blood-pressure reading, without diagnosis of hypertension 03/13/2021   Microscopic hematuria 11/26/2020   Chronic pain 11/10/2020   Eosinophilic esophagitis 11/10/2020   Generalized anxiety disorder 11/10/2020   Hardening of the aorta (main artery of the heart) (HCC) 11/10/2020   Osteoporosis 11/10/2020   Spondylolisthesis at L4-L5 level 11/10/2020   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   Corns and callosities 05/20/2019   Pain in both feet 05/20/2019   Chest pain 01/28/2019   Chest pain of uncertain etiology 12/25/2018   Adiposity 07/13/2018   Excess weight 07/13/2018   Hay fever 07/13/2018   PAF (paroxysmal atrial fibrillation) (HCC) 08/25/2017   Neck pain 12/30/2016   Chronic low back pain 08/12/2016   Syncope 05/13/2016   Heart palpitations 05/13/2016   Rhinitis, chronic 02/04/2016   IBS (irritable bowel syndrome) 07/14/2015   Nausea without vomiting 07/14/2015   OA (osteoarthritis) of knee 06/23/2015   OSA (obstructive sleep apnea) 05/14/2015   Acute back pain with sciatica 04/03/2015   Preoperative clearance 01/08/2015   Acute infection of nasal sinus 02/25/2014   Pancreatitis 07/22/2013   Leukocytosis 07/22/2013   Amblyopia 03/07/2013   Retinal scar 03/07/2013   Cellulitis 01/15/2013   Cerebrovascular small vessel disease 11/08/2012   Angular blepharitis 10/11/2012   Dry eyes 10/11/2012   PCO (posterior capsular opacification) 10/11/2012   PVD (posterior vitreous detachment) 10/11/2012   Crossover toe 09/20/2012   Cataracts, bilateral    Elevated cholesterol    Degenerative disc disease    Bunion    Ankle fracture    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HCC)    Hemorrhoid    DUB  (dysfunctional uterine bleeding)    DIARRHEA 09/16/2009   History of colonic polyps 09/16/2009   LACUNAR INFARCTION 07/16/2008   CONSTIPATION 07/12/2008   CHEST PAIN 07/12/2008   DYSPHAGIA 07/12/2008   Hypothyroidism 07/10/2008   Anxiety state 07/10/2008   DEPRESSION 07/10/2008   INTERNAL HEMORRHOIDS 07/10/2008   ESOPHAGEAL STRICTURE 07/10/2008   GERD 07/10/2008  HIATAL HERNIA 07/10/2008   DIVERTICULOSIS, COLON 07/10/2008   IRRITABLE BOWEL SYNDROME 07/10/2008   Osteoarthritis 07/10/2008   FIBROMYALGIA 07/10/2008   Blues 07/10/2008    ONSET DATE: 11/09/2023 (MD referral)  REFERRING DIAG:  R41.3 (ICD-10-CM) - Memory loss  G62.9 (ICD-10-CM) - Neuropathy  R26.9 (ICD-10-CM) - Gait abnormality    THERAPY DIAG:  Unsteadiness on feet  Other abnormalities of gait and mobility  Muscle weakness (generalized)  Rationale for Evaluation and Treatment: Rehabilitation  SUBJECTIVE:                                                                                                                                                                                             SUBJECTIVE STATEMENT: Is today my last day?  Still have to be somewhat careful with my balance.   Pt accompanied by: self  PERTINENT HISTORY: Dr. Georgianne note:  EMG limited due to swelling; worry about cervical spondylitic myelopathy-MRI ordered PMH of a-fib on Eliquis , lacunar infarction, memory loss, hypothyroidism, walking pneumonia; pt reported hx of Bilat TKR  PAIN: To follow up with [facility] for foot callous- Are you having pain? Yes: NPRS scale: 3-4/10 Pain location: R foot Pain description: constant Aggravating factors: being on feet too long Relieving factors: heat helps, elevating legs  PRECAUTIONS: Fall  WEIGHT BEARING RESTRICTIONS: No  FALLS: Has patient fallen in last 6 months? No  LIVING ENVIRONMENT: Lives with: lives alone and family lives nearby Lives in: House/apartment Stairs: 2  steps to enter Has following equipment at home: Single point cane and Environmental consultant - 2 wheeled  PLOF: Independent  PATIENT GOALS: Feel safer, feel stronger with legs  OBJECTIVE:    TODAY'S TREATMENT: 01/12/2024 Activity Comments  Berg :  41/56 Improved from 31/56  TUG 20.6 no device; 21.06 sec with RW Improved from 28 sec with RW  FTSTS:  15.19 sec with BUE support Improved from 24.47 sec  Gait velocity: 16.91 sec with RW (1.94 ft/sec) Improved from 1.6 ft/sec  Review of HEP PT provides cues throughout-pt reports she is doing these some, but not as often as she should        TODAY'S TREATMENT: 01/09/24 Activity Comments  Vitals at start of session in sitting  95% spO2, 110/71 mmHg, 70 bpm  Pt denies dizziness   standing march 2x20 , 2nd set with 2# Using RW for support; pt reports perception of wobbly however pt appears steady and safe  standing hip ABD,2x10 , 2nd set with 2# Using RW for support; pt reports perception of wobbly however pt appears steady and safe  standing hip EX 2x10, 2nd set with  2# Good form  After 2nd set pt reported mild lightheadedness and was offered sitting break: 97% spO2, 70bpm, 129/13mmHg   standing HS curl 2x10, 2nd set with 2# Good form   mini squat 2x10  With RW; cues to lead with hips/bottom  Nustep L4 x 6 min UEs LEs  For LE strengthening           PATIENT EDUCATION: Education details: POC, progress towards goals, Plan to discharge PT this visit.  Recommend pt continue to use RW for safety due to continued increased fall risk Person educated: Patient Education method: Explanation Education comprehension: verbalized understanding    HOME EXERCISE PROGRAM: Access Code: [email or web address] URL: [email or web address] Date: 12/01/2023 Prepared by: Tawni Ferrier  Exercises - Supine Piriformis Stretch with Foot on Ground  - 1 x daily - 5 x weekly - 1 sets - 3 reps - 20 seconds hold - Bent Knee Fallouts  - 1 x daily - 5 x weekly - 1  sets - 10 reps - Seated Ankle Pumps  - 1 x daily - 5 x weekly - 3 sets - 10 reps - Seated Long Arc Quad  - 1 x daily - 5 x weekly - 3 sets - 10 reps - Sit to Stand with Counter Support  - 1 x daily - 5 x weekly - 1 sets - 5 reps - Staggered Stance Forward Backward Weight Shift with Counter Support  - 1 x daily - 5 x weekly - 1 sets - 10 reps  ---------------------------------------------------- Note: Objective measures were completed at Evaluation unless otherwise noted.  DIAGNOSTIC FINDINGS: awaiting MRI  COGNITION: Overall cognitive status: Within functional limits for tasks assessed and History of cognitive impairments - at baseline   SENSATION: Light touch: Impaired  more distally  EDEMA:  +1 pitting edema BLEs, LLE > RLE  MUSCLE TONE: Generally stiff with P/ROM in BLEs   POSTURE: rounded shoulders and forward head  LOWER EXTREMITY ROM:    Active  Right Eval Left Eval  Hip flexion    Hip extension    Hip abduction    Hip adduction    Hip internal rotation    Hip external rotation    Knee flexion    Knee extension -10 -20  Ankle dorsiflexion +10 + 2  Ankle plantarflexion    Ankle inversion    Ankle eversion     (Blank rows = not tested)  LOWER EXTREMITY MMT:   MMT Right Eval Left Eval  Hip flexion 4 4  Hip extension    Hip abduction 4 4  Hip adduction 4+ 4+  Hip internal rotation    Hip external rotation    Knee flexion 4 4  Knee extension 4 4  Ankle dorsiflexion 3+ 3+  Ankle plantarflexion    Ankle inversion    Ankle eversion    (Blank rows = not tested)  TRANSFERS: Sit to stand: Modified independence  Assistive device utilized: None     Stand to sit: Modified independence  Assistive device utilized: None       GAIT: Findings: Gait Characteristics: step through pattern, Distance walked: 50 ft x 2 , Assistive device utilized:Walker - 2 wheeled, and Level of assistance: SBA  FUNCTIONAL TESTS:  5 times sit to stand: 24.47 sec with UE  support Timed up and go (TUG): 28.16 sec RW; 20.53 sec  10 meter walk test: 20.53 sec (1.6 ft/sec) Berg Balance:  TBA EO feet together:  30 sec mild sway EC feet together 20 sec  moderate sway *Pt c/o sciatica type pain in RLE/piriformis area with short distance gait.                                                                                                                       GOALS: Goals reviewed with patient? Yes  SHORT TERM GOALS: Target date: 12/16/2023  Pt will be independent with HEP for improved balance, strength, gait. Baseline: Goal status: PARTIALLY MET, 12/13/2023  2.  Pt will improve 5x sit<>stand to less than or equal to 18 sec with UE support to demonstrate improved functional strength and transfer efficiency. Baseline: 24.47 sec with BUE support>15 sec 12/13/2023 Goal status: MET, 12/13/2023  3.  Pt will improve TUG score to less than or equal to 20 sec with RW for decreased fall risk. Baseline: 28 sec>20 sec Goal status: MET, 12/13/2023  LONG TERM GOALS: Target date: 01/13/2024  Pt will be independent with HEP for improved strength, balance, gait. Baseline: needs cues Goal status: NOT MET, 01/12/2024  2.  Pt will improve 5x sit<>stand to less than or equal to 15 sec with light to no UE support to demonstrate improved functional strength and transfer efficiency. Baseline: 24.47 sec with UE support>15.19 sec UE support 01/12/2024 Goal status: PARTIALLY MET, 01/12/2024  3.  Pt will improve TUG score to less than or equal to 15 sec with LRAD for decreased fall risk. Baseline: 20.6 sec RW 01/12/2024 Goal status: PARTIALLY MET 01/12/2024  4.  Pt will improve Berg score to at least 45/56 to decrease fall risk. Baseline: 31/56>41/56 01/12/2024 Goal status: PARTIALLY MET 01/12/2024  5.  Pt will verbalize understanding of fall prevention in home environment.  Baseline:  Goal status: MET 01/12/2024  6.  Pt will improve gait velocity to at least 2 ft/sec for improved gait  efficiency and safety.  Baseline: 1.94 ft/sec  Goal status:  PARTIALLY MET 01/12/2024  ASSESSMENT:  CLINICAL IMPRESSION: Pt presents today and reports ongoing foot pain; she is to see foot doctor soon. Skilled PT session focused on assessing LTGs; she has made some progress towards functional mobility measures; she does remain at fall risk per FTSTS, TUG and Berg scores.  Educated in fall prevention and that RW is likely to continue to be optimal assistive device for safety with gait.  She is going to follow up with foot doctor due to ongoing foot pain, and she is in agreement with discharge today, to consistently work on HEP at home for the short term.  Advised that she can return with MD referral if further needs arise.   OBJECTIVE IMPAIRMENTS: Abnormal gait, decreased balance, decreased mobility, difficulty walking, and decreased strength.   PLAN:  PT FREQUENCY: 2x/week  PT DURATION: 8 weeks including eval week  PLANNED INTERVENTIONS: 97750- Physical Performance Testing, 97110-Therapeutic exercises, 97530- Therapeutic activity, [id number]- Neuromuscular re-education, 97535- Self Care, [id number]- Manual therapy, [phone]- Gait training, Patient/Family education, and Balance training  PLAN FOR NEXT SESSION: Discharge this visit.   Greig Anon, PT 01/12/24  2:45 PM Phone: [phone] Fax: [phone]  [facility] [address], [address] [place], [place] [address] Phone # [phone] Fax # [phone]

## 2024-01-17 ENCOUNTER — Telehealth: Payer: Self-pay | Admitting: Neurology

## 2024-01-17 NOTE — Telephone Encounter (Signed)
 Pt called to speak to MD about Home Therapy the patient wanted to know when will those appt be and What will they be doing with her at [facility]

## 2024-01-17 NOTE — Telephone Encounter (Signed)
 Cld Pt regarding phone message. Pt asking about the time for her driving rehab appt and what they will be doing. Informed Pt we do not have that information but the [facility] would be able to give her that info. Gave Pt phone number for [employer] [phone] as she stated she could not locate the number. Pt also asked for [facility] phone number and address, which was relayed. Pt voiced understanding and much appreciation for our demeanor, kindness and not trying to rush her off the phone. Thanks was reciprocated.

## 2024-01-25 ENCOUNTER — Ambulatory Visit: Admitting: Podiatry

## 2024-01-25 ENCOUNTER — Encounter: Payer: Self-pay | Admitting: Podiatry

## 2024-01-25 DIAGNOSIS — M79674 Pain in right toe(s): Secondary | ICD-10-CM | POA: Diagnosis not present

## 2024-01-25 DIAGNOSIS — Z7901 Long term (current) use of anticoagulants: Secondary | ICD-10-CM

## 2024-01-25 DIAGNOSIS — B351 Tinea unguium: Secondary | ICD-10-CM

## 2024-01-25 DIAGNOSIS — L84 Corns and callosities: Secondary | ICD-10-CM | POA: Diagnosis not present

## 2024-01-25 DIAGNOSIS — M79675 Pain in left toe(s): Secondary | ICD-10-CM | POA: Diagnosis not present

## 2024-01-25 NOTE — Progress Notes (Signed)
 Subjective:  Patient ID: Kimberly [name], female    DOB: [birth year],   MRN: [id number]  Chief Complaint  Patient presents with   Callouses    She has a place on the bottom of her left foot and a place on her toe on the other foot that Dr. Joya checks.    83 y.o. female presents for concern of thickened elongated and painful nails that are difficult to trim. Requesting to have them trimmed today. Also relates painful corns and calluses   She is on chronic anticoagulation and at risk for foot care  PCP:  Husain, Karrar, MD    . Denies any other pedal complaints. Denies n/v/f/c.   Past Medical History:  Diagnosis Date   Allergy    Anal polyp 1998   Flex Sig    Anemia    Angular blepharitis of left eye    Ankle fracture    Stress fracture   Anxiety    Aortic atherosclerosis (HCC)    Asthma    border line has inhaler   Bunion    Cataracts, bilateral    Corneal scar    left eye   CPAP (continuous positive airway pressure) dependence    Degenerative disc disease    Depression    Diverticulosis of colon (without mention of hemorrhage) 2010   Colonoscopy   Dry eyes    bilateral   Enterocele    External hemorrhoids 2000   Colonoscopy   Family history of malignant neoplasm of gastrointestinal tract    Female cystocele    Fibromyalgia    GERD (gastroesophageal reflux disease)    Hiatal hernia 2005,2010   EGD   History of bronchitis    History of measles    History of mumps    History of strep sore throat    History of urinary tract infection    Hyperlipemia    Hypothyroidism    IBS (irritable bowel syndrome)    Imbalance    Internal hemorrhoids without mention of complication 1995,2005   Colonoscopy    Itching    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HCC)    Menopause    Migraine    OSA (obstructive sleep apnea) 05/14/2015   on BiPAP   Osteopenia 10/2013   T score -1.6 FRAX 10%/1.6%   PAF (paroxysmal atrial fibrillation) (HCC)    Pancreatitis    PCO (posterior capsular  opacification)    left   Pneumonia    childhood illness   PONV (postoperative nausea and vomiting)    Pseudophakia, both eyes    PVD (posterior vitreous detachment) right   Rash    on back    Retinal scar    left   Rotator cuff disorder    pain, left shoulder   Shingles    Stricture and stenosis of esophagus 2005,2010   EGD    Stroke (HCC)    Ulcerative colitis (HCC)    Varicose veins    Vertigo    Wears glasses     Objective:  Physical Exam: Vascular: DP/PT pulses 2/4 bilateral. CFT <3 seconds. Absent hair growth on digits. Edema noted to bilateral lower extremities. Xerosis noted bilaterally.  Skin. No lacerations or abrasions bilateral feet. Nails 1-5 bilateral  are thickened discolored and elongated with subungual debris. Hyperkeratotic cored lesion noted to medial right hallux. Hyperkeratotic lesion noted sub third metatrsal head on the right.  Musculoskeletal: MMT 5/5 bilateral lower extremities in DF, PF, Inversion and Eversion. Deceased ROM in DF of ankle joint. HAV  deformity noted bilateral. Hammered second digit on right and deformity noted to second on left from failed hammertoe surgery.  Neurological: Sensation intact to light touch. Protective sensation diminished bilateral.    Assessment:   1. Pain due to onychomycosis of toenails of both feet   2. Chronic anticoagulation   3. Corns and callosities       Plan:  Patient was evaluated and treated and all questions answered. --Hyeprkeratotic tissue to right great toe and plantar left third metatarsal head debrided without incident with chisel  -No signs of infection noted. Advised on toe separator and padding.  -Mechanically debrided all nails 1-5 bilateral using sterile nail nipper and filed with dremel without incident  -Culture negative for fungus. SABRA  -Answered all patient questions -Patient to return  in 3 months for at risk foot care -Patient advised to call the office if any problems or questions arise  in the meantime.   Asberry Failing, DPM

## 2024-01-26 DIAGNOSIS — F0150 Vascular dementia without behavioral disturbance: Secondary | ICD-10-CM | POA: Diagnosis not present

## 2024-01-26 DIAGNOSIS — F39 Unspecified mood [affective] disorder: Secondary | ICD-10-CM | POA: Diagnosis not present

## 2024-01-26 DIAGNOSIS — M7989 Other specified soft tissue disorders: Secondary | ICD-10-CM | POA: Diagnosis not present

## 2024-03-06 DIAGNOSIS — Z23 Encounter for immunization: Secondary | ICD-10-CM | POA: Diagnosis not present

## 2024-03-06 DIAGNOSIS — I872 Venous insufficiency (chronic) (peripheral): Secondary | ICD-10-CM | POA: Diagnosis not present

## 2024-03-06 DIAGNOSIS — G629 Polyneuropathy, unspecified: Secondary | ICD-10-CM | POA: Diagnosis not present

## 2024-03-06 DIAGNOSIS — E538 Deficiency of other specified B group vitamins: Secondary | ICD-10-CM | POA: Diagnosis not present

## 2024-03-19 ENCOUNTER — Other Ambulatory Visit: Payer: Self-pay | Admitting: Internal Medicine

## 2024-03-19 ENCOUNTER — Ambulatory Visit: Admitting: Cardiology

## 2024-03-19 DIAGNOSIS — Z1231 Encounter for screening mammogram for malignant neoplasm of breast: Secondary | ICD-10-CM

## 2024-03-22 ENCOUNTER — Ambulatory Visit: Admitting: Cardiology

## 2024-03-26 ENCOUNTER — Other Ambulatory Visit: Payer: Self-pay | Admitting: Neurology

## 2024-03-26 ENCOUNTER — Other Ambulatory Visit: Payer: Self-pay

## 2024-03-26 NOTE — Progress Notes (Unsigned)
 Office Visit    Patient Name: Kimberly [name] Date of Encounter: 03/28/2024  PCP:  Ransom Other, MD   [facility]  Cardiologist:  Wilbert Bihari, MD  Advanced Practice Provider:  No care team member to display Electrophysiologist:  None   HPI    Kimberly [name] is a 83 y.o. female with a past medical history significant for PAF on metoprolol  and Eliquis  for CHA2DS2-VASc score 5 presents today for follow-up appointment and preop clearance.  Seen in the A-fib clinic because of bradycardia.  Has a history of chest pain with normal nuclear stress test and syncope felt secondary to dehydration.  She has a chronic RBBB.  Due to chest pain a coronary CTA was done 11/2018 and showed a calcium  score of 0 with no evidence of CAD.  She has moderate OSA with a AHI of 16/h on HST.  She was last seen by Dr. Bihari 06/16/2022 and was doing well at that time.  No chest pain or pressure.  No shortness of breath, DOE, PND, orthopnea, dizziness or syncope.  She did have some lower extremity edema at..  She had an episode of PAF the day prior in the setting of a stressful event with her husband.  Compliant with medications.  Doing well with her PAP device and just started back a week ago after being off of it for a while.  Tolerating mask and feels the pressure is adequate.  I saw her January 2024, she tells me that she sometimes has issues with stairs but can do them.  She has no issues with walking 1-2 blocks.  She like to have colonoscopy done because she has had polyps in the past.  She is to do a lot of walking and dancing but not so much anymore.  She does have sciatica holds her back at times.  She has some postnasal drip and voice changes.  We discussed Mucinex.  We reviewed her echocardiogram.  Reports no shortness of breath nor dyspnea on exertion. Reports no chest pain, pressure, or tightness. No edema, orthopnea, PND. Reports no palpitations.   Today, she presents  with a hx of atrial fibrillation and sleep apnea who presents for cardiovascular follow-up.   She has atrial fibrillation and is currently taking Eliquis  and Cardizem  240 mg at night. She also has untreated sleep apnea and is not using her CPAP device due to discomfort and difficulty managing it alone. Her current medications include Eliquis , Cardizem , and Crestor  for cholesterol management. She is unsure if recent cholesterol labs have been done and is due for a lipid panel. She lives alone and relies on her sister for transportation.  She also has some neuropathy which is causing her some pain. She will follow-up with neuro for this at the beginning of the year.   Reports no shortness of breath nor dyspnea on exertion. Reports no chest pain, pressure, or tightness. No edema, orthopnea, PND. Reports no palpitations.   Discussed the use of AI scribe software for clinical note transcription with the patient, who gave verbal consent to proceed.   Past Medical History    Past Medical History:  Diagnosis Date   Allergy    Anal polyp 1998   Flex Sig    Anemia    Angular blepharitis of left eye    Ankle fracture    Stress fracture   Anxiety    Aortic atherosclerosis    Asthma    border line has inhaler  Bunion    Cataracts, bilateral    Corneal scar    left eye   CPAP (continuous positive airway pressure) dependence    Degenerative disc disease    Depression    Diverticulosis of colon (without mention of hemorrhage) 2010   Colonoscopy   Dry eyes    bilateral   Enterocele    External hemorrhoids 2000   Colonoscopy   Family history of malignant neoplasm of gastrointestinal tract    Female cystocele    Fibromyalgia    GERD (gastroesophageal reflux disease)    Hiatal hernia 2005,2010   EGD   History of bronchitis    History of measles    History of mumps    History of strep sore throat    History of urinary tract infection    Hyperlipemia    Hypothyroidism    IBS  (irritable bowel syndrome)    Imbalance    Internal hemorrhoids without mention of complication 1995,2005   Colonoscopy    Itching    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HCC)    Menopause    Migraine    OSA (obstructive sleep apnea) 05/14/2015   on BiPAP   Osteopenia 10/2013   T score -1.6 FRAX 10%/1.6%   PAF (paroxysmal atrial fibrillation) (HCC)    Pancreatitis    PCO (posterior capsular opacification)    left   Pneumonia    childhood illness   PONV (postoperative nausea and vomiting)    Pseudophakia, both eyes    PVD (posterior vitreous detachment) right   Rash    on back    Retinal scar    left   Rotator cuff disorder    pain, left shoulder   Shingles    Stricture and stenosis of esophagus 2005,2010   EGD    Stroke (HCC)    Ulcerative colitis (HCC)    Varicose veins    Vertigo    Wears glasses    Past Surgical History:  Procedure Laterality Date   ABDOMINAL HYSTERECTOMY     ANAL FISSURE REPAIR     BREAST EXCISIONAL BIOPSY Left    BUNIONECTOMY  2014   CATARACT EXTRACTION Bilateral 2013   COLONOSCOPY     polyp removed   ESOPHAGEAL DILATION     EYE SURGERY Left    FOOT SURGERY     left    HEMORRHOID SURGERY     MOUTH SURGERY  11/13/11   Cyst removed from gum-benign    NASAL SEPTUM SURGERY     NASAL SEPTUM SURGERY  1970's   REPLACEMENT TOTAL KNEE Right 2011   TONSILLECTOMY     TOTAL ABDOMINAL HYSTERECTOMY W/ BILATERAL SALPINGOOPHORECTOMY  1991   TAH BSO   TOTAL HIP ARTHROPLASTY     right    TOTAL KNEE ARTHROPLASTY     both   TOTAL KNEE ARTHROPLASTY Left 06/23/2015   Procedure: LEFT TOTAL KNEE ARTHROPLASTY;  Surgeon: Dempsey Moan, MD;  Location: [facility];  Service: Orthopedics;  Laterality: Left;    Allergies  Allergies  Allergen Reactions   Amoxicillin-Pot Clavulanate Nausea Only, Other (See Comments) and Swelling    Has patient had a PCN reaction causing immediate rash, facial/tongue/throat swelling, SOB or lightheadedness with hypotension: No  Has patient had a PCN  reaction causing severe rash involving mucus membranes or skin necrosis: No  Has patient had a PCN reaction that required hospitalization No  Has patient had a PCN reaction occurring within the last 10 years: No  If all of the above answers  are NO, then may proceed with Cephalosporin use.  Other reaction(s): nausea  Other Reaction(s): GI Intolerance   Azithromycin Other (See Comments)    Pancreatitis  Other reaction(s): Other, pancreatitis  Other Reaction(s): Other (See Comments)  pancreatitis  azithromycin   Erythromycin Hives and Other (See Comments)    Reaction 1 time, when she was younger.  Other reaction(s): hives   Almond Oil Other (See Comments)    Almonds: per allergy testing.   Amoxicillin Nausea Only   Atorvastatin Other (See Comments)    Muscle weakness  Other reaction(s): muscle weakness, Other  atorvastatin   Clavulanic Acid Nausea Only    Other Reaction(s): GI Intolerance   Codeine Nausea Only and Other (See Comments)    Other reaction(s): Nausea/Vomiting  Other Reaction(s): GI Intolerance   Ketoconazole Nausea And Vomiting   Ketoconazole Nausea And Vomiting, Nausea Only and Other (See Comments)    Other reaction(s): Unknown  Other Reaction(s): GI Intolerance   Latex Other (See Comments)    Other  Other Reaction(s): Other (See Comments)  Other,   Morphine  Nausea Only and Other (See Comments)    Other reaction(s): Unknown  Other Reaction(s): GI Intolerance   Morphine  Sulfate Nausea And Vomiting    Other reaction(s): Unknown   Shellfish Allergy Other (See Comments)    Shellfish mix: per allergy testing.   Simvastatin Other (See Comments)    Muscle pain  Other reaction(s): muscle pain, Other  simvastatin   Sulfamethoxazole-Trimethoprim Nausea Only    Upset stomach  Other Reaction(s): GI Intolerance   Clindamycin Rash   Clindamycin/Lincomycin Nausea And Vomiting and Rash   Erythromycin Base Rash    EKGs/Labs/Other Studies Reviewed:    The following studies were reviewed today:  Echo 06/16/21  IMPRESSIONS     1. Left ventricular ejection fraction, by estimation, is 60 to 65%. Left  ventricular ejection fraction by 3D volume is 70 %. The left ventricle has  normal function. The left ventricle has no regional wall motion  abnormalities. Left ventricular diastolic   parameters are indeterminate.   2. Right ventricular systolic function is normal. The right ventricular  size is normal. There is normal pulmonary artery systolic pressure.   3. Left atrial size was mildly dilated.   4. The mitral valve is normal in structure. Mild mitral valve  regurgitation. No evidence of mitral stenosis.   5. The aortic valve is normal in structure. Aortic valve regurgitation is  not visualized. No aortic stenosis is present.   6. The inferior vena cava is normal in size with greater than 50%  respiratory variability, suggesting right atrial pressure of 3 mmHg.   FINDINGS   Left Ventricle: Conflicting data re: diastolic function. The normal  mitral inflow pattern is unexpected at age 59, especially in view of left  atrial dilation. The pulmonary vein flow is diastolic dominant, which  would also support a pseudonormal  pattern. Nevertheless, mitral annulus diastolic velocities are excellent  for age. Consider relative blunting of the A wave (for example, after a  recent episode of paroxysmal atrial fibrillation). Note the absence of  other features that would support an  echo diagnosis of constrictive physiology. Left ventricular ejection  fraction, by estimation, is 60 to 65%. Left ventricular ejection fraction  by 3D volume is 70 %. The left ventricle has normal function. The left  ventricle has no regional wall motion  abnormalities. The left ventricular internal cavity size was normal in  size. There is no left ventricular hypertrophy. Left  ventricular diastolic  parameters are indeterminate. Indeterminate filling  pressures.   Right Ventricle: The right ventricular size is normal. No increase in  right ventricular wall thickness. Right ventricular systolic function is  normal. There is normal pulmonary artery systolic pressure. The tricuspid  regurgitant velocity is 2.64 m/s, and   with an assumed right atrial pressure of 3 mmHg, the estimated right  ventricular systolic pressure is 30.9 mmHg.   Left Atrium: Left atrial size was mildly dilated.   Right Atrium: Right atrial size was normal in size.   Pericardium: There is no evidence of pericardial effusion.   Mitral Valve: The mitral valve is normal in structure. Mild mitral valve  regurgitation. No evidence of mitral valve stenosis.   Tricuspid Valve: The tricuspid valve is normal in structure. Tricuspid  valve regurgitation is mild . No evidence of tricuspid stenosis.   Aortic Valve: The aortic valve is normal in structure. Aortic valve  regurgitation is not visualized. Aortic regurgitation PHT measures 621  msec. No aortic stenosis is present. Aortic valve mean gradient measures  4.5 mmHg. Aortic valve peak gradient  measures 8.7 mmHg. Aortic valve area, by VTI measures 2.53 cm.   Pulmonic Valve: The pulmonic valve was normal in structure. Pulmonic valve  regurgitation is not visualized. No evidence of pulmonic stenosis.   Aorta: The aortic root is normal in size and structure.   Venous: The inferior vena cava is normal in size with greater than 50%  respiratory variability, suggesting right atrial pressure of 3 mmHg.   IAS/Shunts: No atrial level shunt detected by color flow Doppler.       EKG:  EKG is not ordered today.    Recent Labs: 09/28/2023: TSH 4.650 11/02/2023: B Natriuretic Peptide 97.0; BUN 27; Creatinine, Ser 0.78; Hemoglobin 11.6; Platelets 203; Potassium 4.1; Sodium 139  Recent Lipid Panel    Component Value Date/Time   CHOL 124 07/23/2013 0632   TRIG 69 07/23/2013 0632   HDL 68 07/23/2013 0632   CHOLHDL 1.8  07/23/2013 0632   VLDL 14 07/23/2013 0632   LDLCALC 42 07/23/2013 0632    Home Medications   Current Meds  Medication Sig   acetaminophen  (TYLENOL ) 500 MG tablet Take 500 mg by mouth every 6 (six) hours as needed for moderate pain.   albuterol  (VENTOLIN  HFA) 108 (90 Base) MCG/ACT inhaler Inhale 2 puffs into the lungs every 6 (six) hours as needed for wheezing or shortness of breath.   ALPRAZolam  (XANAX ) 0.5 MG tablet Take 0.5 tablets (0.25 mg total) by mouth once as needed for up to 1 dose for anxiety. Take 30 minutes before MRI, may take an additional 1/2 pill if needed.  Do not drive after taking this medication.   azelastine  (ASTELIN ) 0.1 % nasal spray Place 2 sprays into both nostrils 2 (two) times daily as needed for rhinitis. Use in each nostril as directed   bacitracin 500 UNIT/GM ointment Apply to the lower legs Externally Once a day; Duration: 30 days   CALTRATE 600+D3 SOFT 600-20 MG-MCG CHEW daily at 6 (six) AM.   Cholecalciferol 100 MCG (4000 UT) TABS Take by mouth.   cycloSPORINE  (RESTASIS ) 0.05 % ophthalmic emulsion Place 1 drop into both eyes 2 (two) times daily.   diltiazem  (CARDIZEM  CD) 240 MG 24 hr capsule TAKE 1 CAPSULE BY MOUTH EVERY DAY   ELIQUIS  5 MG TABS tablet TAKE 1 TABLET BY MOUTH TWICE A DAY   EPINEPHrine  0.3 mg/0.3 mL IJ SOAJ injection Inject 0.3 mg into  the muscle as needed for anaphylaxis.   famotidine (PEPCID) 20 MG tablet TAKE 1 TABLET BY MOUTH EVERY DAY AT BEDTIME AS NEEDED; Duration: 90   fluticasone  (FLONASE ) 50 MCG/ACT nasal spray PLACE 2 SPRAYS INTO BOTH NOSTRILS 2 (TWO) TIMES DAILY AS NEEDED FOR ALLERGIES. *DUE FOR OFFICE VISIT   furosemide  (LASIX ) 20 MG tablet Take 20 mg by mouth daily.   KLOR-CON  M10 10 MEQ tablet Take 10 mEq by mouth as needed (TAKE ONLY WHEN TAKE A LASIX ).   Levothyroxine  Sodium 112 MCG/ML SOLN Take 112 mcg by mouth every morning.   meclizine (ANTIVERT) 12.5 MG tablet Take 2 tablets by mouth as directed.   memantine  (NAMENDA   TITRATION PAK) tablet pack 5 mg/day for =1 week; 5 mg twice daily for =1 week; 15 mg/day given in 5 mg and 10 mg separated doses for =1 week; then 10 mg twice daily   memantine  (NAMENDA ) 10 MG tablet TAKE 1 TABLET BY MOUTH TWICE A DAY   MIRALAX  17 GM/SCOOP powder Take 17 g by mouth as needed.   OLANZapine (ZYPREXA) 2.5 MG tablet Take 2.5 mg by mouth at bedtime.   Polyethyl Glycol-Propyl Glycol (SYSTANE) 0.4-0.3 % SOLN Apply 2 drops to eye daily as needed.   triamcinolone  cream (KENALOG) 0.1 % Apply 1 Application topically.   [DISCONTINUED] rosuvastatin  (CRESTOR ) 10 MG tablet Take 5 mg by mouth at bedtime.     Review of Systems      All other systems reviewed and are otherwise negative except as noted above.  Physical Exam    VS:  BP 130/64 (BP Location: Left Arm, Patient Position: Sitting, Cuff Size: Large)   Pulse 67   Resp 16   Ht 5' 7 (1.702 m)   Wt 156 lb (70.8 kg)   LMP  (LMP Unknown)   SpO2 95%   BMI 24.43 kg/m  , BMI Body mass index is 24.43 kg/m.  Wt Readings from Last 3 Encounters:  03/28/24 156 lb (70.8 kg)  11/18/23 165 lb 12.8 oz (75.2 kg)  11/02/23 156 lb 1.4 oz (70.8 kg)     GEN: Well nourished, well developed, in no acute distress. HEENT: normal. Neck: Supple, no JVD, carotid bruits, or masses. Cardiac: RRR, no murmurs, rubs, or gallops. No clubbing, cyanosis, edema.  Radials/PT 2+ and equal bilaterally.  Respiratory:  Respirations regular and unlabored, clear to auscultation bilaterally. GI: Soft, nontender, nondistended. MS: No deformity or atrophy. Skin: Warm and dry, no rash. Neuro:  Strength and sensation are intact. Psych: Normal affect.  Assessment & Plan    Paroxysmal atrial fibrillation AFib well-controlled with Eliquis  and Cardizem . Heart rate 67 bpm, BP 130/64 mmHg. No AFib symptoms reported. - Continue Eliquis  for anticoagulation. - Continue Cardizem  240 mg at night for rate control and blood pressure management.  Obstructive sleep apnea,  nonadherence to CPAP therapy Nonadherence to CPAP due to discomfort. Untreated sleep apnea may worsen AFib. Discussed CPAP importance for managing sleep apnea and AFib. - Contact Nina for CPAP device troubleshooting and support. - Encourage resumption of CPAP therapy to manage sleep apnea and reduce AFib exacerbation risks.  Hyperlipidemia Managed with Crestor . Lipid panel needed as last one was over a year ago. Discussed monitoring cholesterol levels. - Order fasting lipid panel to assess cholesterol levels. - Continue Crestor  for cholesterol management.    Disposition: Follow up 12 months with Wilbert Bihari, MD or APP.  Signed, Orren LOISE Fabry, PA-C 03/28/2024, 4:00 PM [facility]

## 2024-03-28 ENCOUNTER — Encounter: Payer: Self-pay | Admitting: Physician Assistant

## 2024-03-28 ENCOUNTER — Other Ambulatory Visit: Payer: Self-pay

## 2024-03-28 ENCOUNTER — Ambulatory Visit: Attending: Physician Assistant | Admitting: Physician Assistant

## 2024-03-28 VITALS — BP 130/64 | HR 67 | Resp 16 | Ht 67.0 in | Wt 156.0 lb

## 2024-03-28 DIAGNOSIS — M7989 Other specified soft tissue disorders: Secondary | ICD-10-CM

## 2024-03-28 DIAGNOSIS — G4733 Obstructive sleep apnea (adult) (pediatric): Secondary | ICD-10-CM

## 2024-03-28 DIAGNOSIS — I480 Paroxysmal atrial fibrillation: Secondary | ICD-10-CM

## 2024-03-28 MED ORDER — ROSUVASTATIN CALCIUM 10 MG PO TABS: 5.0000 mg | ORAL_TABLET | Freq: Every evening | ORAL | 3 refills | Status: AC

## 2024-03-28 NOTE — Patient Instructions (Signed)
 Medication Instructions:  Your physician recommends that you continue on your current medications as directed. Please refer to the Current Medication list given to you today. *If you need a refill on your cardiac medications before your next appointment, please call your pharmacy*  Lab Work: AT YOUR CONVENIENCE FASTING-LIPIDS If you have labs (blood work) drawn today and your tests are completely normal, you will receive your results only by: MyChart Message (if you have MyChart) OR A paper copy in the mail If you have any lab test that is abnormal or we need to change your treatment, we will call you to review the results.  Testing/Procedures: NONE ORDERED  Follow-Up: At [facility], you and your health needs are our priority.  As part of our continuing mission to provide you with exceptional heart care, our providers are all part of one team.  This team includes your primary Cardiologist (physician) and Advanced Practice Providers or APPs (Physician Assistants and Nurse Practitioners) who all work together to provide you with the care you need, when you need it.  Your next appointment:   12 month(s)  Provider:   Wilbert Bihari, MD or Orren Fabry, MD   We recommend signing up for the patient portal called MyChart.  Sign up information is provided on this After Visit Summary.  MyChart is used to connect with patients for Virtual Visits (Telemedicine).  Patients are able to view lab/test results, encounter notes, upcoming appointments, etc.  Non-urgent messages can be sent to your provider as well.   To learn more about what you can do with MyChart, go to [email or web address].   Other Instructions

## 2024-04-05 DIAGNOSIS — E039 Hypothyroidism, unspecified: Secondary | ICD-10-CM | POA: Diagnosis not present

## 2024-04-05 DIAGNOSIS — I872 Venous insufficiency (chronic) (peripheral): Secondary | ICD-10-CM | POA: Diagnosis not present

## 2024-04-05 DIAGNOSIS — F0150 Vascular dementia without behavioral disturbance: Secondary | ICD-10-CM | POA: Diagnosis not present

## 2024-04-05 DIAGNOSIS — E538 Deficiency of other specified B group vitamins: Secondary | ICD-10-CM | POA: Diagnosis not present

## 2024-04-05 DIAGNOSIS — G4733 Obstructive sleep apnea (adult) (pediatric): Secondary | ICD-10-CM | POA: Diagnosis not present

## 2024-04-05 DIAGNOSIS — I480 Paroxysmal atrial fibrillation: Secondary | ICD-10-CM | POA: Diagnosis not present

## 2024-04-05 DIAGNOSIS — D6869 Other thrombophilia: Secondary | ICD-10-CM | POA: Diagnosis not present

## 2024-04-05 DIAGNOSIS — K219 Gastro-esophageal reflux disease without esophagitis: Secondary | ICD-10-CM | POA: Diagnosis not present

## 2024-04-05 DIAGNOSIS — E785 Hyperlipidemia, unspecified: Secondary | ICD-10-CM | POA: Diagnosis not present

## 2024-04-05 LAB — LAB REPORT - SCANNED
Free T4: 1 ng/dL
TSH: 5.17 (ref 0.41–5.90)

## 2024-04-09 ENCOUNTER — Telehealth: Payer: Self-pay | Admitting: Physician Assistant

## 2024-04-09 NOTE — Telephone Encounter (Signed)
 Pt called in stating she was supposed to have her cholesterol checked for Tessa, PA but she had it done at her pcp. She asked if [place], [place] can review. She states her PCP (Dr. Ransom) sent it over already, not sure if this was received or not. Please advise.

## 2024-04-09 NOTE — Telephone Encounter (Signed)
 Spoke to patient she stated she saw Orren Fabry PA last month.She wanted her to have a lipid panel.Stated she had lipid panel done with PCP.Advised I will send message to Orren to make her aware lipid panel has been scanned into chart,She wanted to know when she needs to repeat.Message sent to [facility].

## 2024-04-17 ENCOUNTER — Other Ambulatory Visit: Payer: Self-pay | Admitting: Cardiology

## 2024-04-17 DIAGNOSIS — I480 Paroxysmal atrial fibrillation: Secondary | ICD-10-CM

## 2024-04-17 NOTE — Telephone Encounter (Signed)
 Prescription refill request for Eliquis  received. Indication:afib Last office visit:10/25 Scr:0.78  6/25 Age: 83 Weight:70.8  kg  Prescription refilled

## 2024-04-18 ENCOUNTER — Other Ambulatory Visit: Payer: Self-pay | Admitting: Neurology

## 2024-04-18 NOTE — Telephone Encounter (Signed)
 Last seen on 09/28/23 Follow up scheduled on 07/19/23

## 2024-04-19 ENCOUNTER — Ambulatory Visit
Admission: RE | Admit: 2024-04-19 | Discharge: 2024-04-19 | Disposition: A | Discharge status: Home / Self Care | Source: Ambulatory Visit | Attending: Internal Medicine | Admitting: Internal Medicine

## 2024-04-19 ENCOUNTER — Ambulatory Visit: Admitting: Podiatry

## 2024-04-19 DIAGNOSIS — Z1231 Encounter for screening mammogram for malignant neoplasm of breast: Secondary | ICD-10-CM | POA: Diagnosis not present

## 2024-04-23 ENCOUNTER — Ambulatory Visit: Admitting: Podiatry

## 2024-05-03 ENCOUNTER — Encounter: Payer: Self-pay | Admitting: Podiatry

## 2024-05-03 ENCOUNTER — Ambulatory Visit: Admitting: Podiatry

## 2024-05-03 DIAGNOSIS — B351 Tinea unguium: Secondary | ICD-10-CM | POA: Diagnosis not present

## 2024-05-03 DIAGNOSIS — Z7901 Long term (current) use of anticoagulants: Secondary | ICD-10-CM

## 2024-05-03 DIAGNOSIS — M79675 Pain in left toe(s): Secondary | ICD-10-CM | POA: Diagnosis not present

## 2024-05-03 DIAGNOSIS — L84 Corns and callosities: Secondary | ICD-10-CM

## 2024-05-03 DIAGNOSIS — M79674 Pain in right toe(s): Secondary | ICD-10-CM

## 2024-05-03 NOTE — Progress Notes (Signed)
 Subjective:  Patient ID: Kimberly [name], female    DOB: Apr 08, 1941,   MRN: [id number]  No chief complaint on file.   83 y.o. female presents for concern of thickened elongated and painful nails that are difficult to trim. Requesting to have them trimmed today. Also relates painful corns and calluses   She is on chronic anticoagulation and at risk for foot care  PCP:  Husain, Karrar, MD    . Denies any other pedal complaints. Denies n/v/f/c.   Past Medical History:  Diagnosis Date   Allergy    Anal polyp 1998   Flex Sig    Anemia    Angular blepharitis of left eye    Ankle fracture    Stress fracture   Anxiety    Aortic atherosclerosis    Asthma    border line has inhaler   Bunion    Cataracts, bilateral    Corneal scar    left eye   CPAP (continuous positive airway pressure) dependence    Degenerative disc disease    Depression    Diverticulosis of colon (without mention of hemorrhage) 2010   Colonoscopy   Dry eyes    bilateral   Enterocele    External hemorrhoids 2000   Colonoscopy   Family history of malignant neoplasm of gastrointestinal tract    Female cystocele    Fibromyalgia    GERD (gastroesophageal reflux disease)    Hiatal hernia 2005,2010   EGD   History of bronchitis    History of measles    History of mumps    History of strep sore throat    History of urinary tract infection    Hyperlipemia    Hypothyroidism    IBS (irritable bowel syndrome)    Imbalance    Internal hemorrhoids without mention of complication 1995,2005   Colonoscopy    Itching    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HCC)    Menopause    Migraine    OSA (obstructive sleep apnea) 05/14/2015   on BiPAP   Osteopenia 10/2013   T score -1.6 FRAX 10%/1.6%   PAF (paroxysmal atrial fibrillation) (HCC)    Pancreatitis    PCO (posterior capsular opacification)    left   Pneumonia    childhood illness   PONV (postoperative nausea and vomiting)    Pseudophakia, both eyes    PVD (posterior  vitreous detachment) right   Rash    on back    Retinal scar    left   Rotator cuff disorder    pain, left shoulder   Shingles    Stricture and stenosis of esophagus 2005,2010   EGD    Stroke (HCC)    Ulcerative colitis (HCC)    Varicose veins    Vertigo    Wears glasses     Objective:  Physical Exam: Vascular: DP/PT pulses 2/4 bilateral. CFT <3 seconds. Absent hair growth on digits. Edema noted to bilateral lower extremities. Xerosis noted bilaterally.  Skin. No lacerations or abrasions bilateral feet. Nails 1-5 bilateral  are thickened discolored and elongated with subungual debris. Hyperkeratotic cored lesion noted to medial right hallux. Hyperkeratotic lesion noted sub third metatrsal head on the right.  Musculoskeletal: MMT 5/5 bilateral lower extremities in DF, PF, Inversion and Eversion. Deceased ROM in DF of ankle joint. HAV deformity noted bilateral. Hammered second digit on right and deformity noted to second on left from failed hammertoe surgery.  Neurological: Sensation intact to light touch. Protective sensation diminished bilateral.    Assessment:  1. Pain due to onychomycosis of toenails of both feet   2. Chronic anticoagulation   3. Corns and callosities       Plan:  Patient was evaluated and treated and all questions answered. --Hyeprkeratotic tissue to right great toe and plantar left third metatarsal head debrided without incident with chisel as courtesy. Advised on prevention.  -No signs of infection noted. Advised on toe separator and padding.  -Mechanically debrided all nails 1-5 bilateral using sterile nail nipper and filed with dremel without incident  -Answered all patient questions -Patient to return  in 3 months for at risk foot care -Patient advised to call the office if any problems or questions arise in the meantime.   Asberry Failing, DPM

## 2024-06-11 ENCOUNTER — Ambulatory Visit: Admitting: Cardiology

## 2024-06-20 ENCOUNTER — Telehealth: Payer: Self-pay | Admitting: Neurology

## 2024-06-20 NOTE — Telephone Encounter (Signed)
 LVM informing pt reschedule is needed for 2/18 MD OUT

## 2024-07-18 ENCOUNTER — Ambulatory Visit: Admitting: Neurology
